# Patient Record
Sex: Female | Born: 1940 | Race: White | Hispanic: No | Smoking: Current every day smoker
Health system: Southern US, Community
[De-identification: ages and names within clinical notes are randomized; demographics above are authoritative.]

## PROBLEM LIST (undated history)

## (undated) DIAGNOSIS — M899 Disorder of bone, unspecified: Secondary | ICD-10-CM

## (undated) DIAGNOSIS — I739 Peripheral vascular disease, unspecified: Secondary | ICD-10-CM

## (undated) DIAGNOSIS — M949 Disorder of cartilage, unspecified: Secondary | ICD-10-CM

## (undated) DIAGNOSIS — F410 Panic disorder [episodic paroxysmal anxiety] without agoraphobia: Secondary | ICD-10-CM

## (undated) DIAGNOSIS — Z789 Other specified health status: Secondary | ICD-10-CM

## (undated) DIAGNOSIS — M129 Arthropathy, unspecified: Secondary | ICD-10-CM

## (undated) DIAGNOSIS — I10 Essential (primary) hypertension: Secondary | ICD-10-CM

## (undated) DIAGNOSIS — I358 Other nonrheumatic aortic valve disorders: Secondary | ICD-10-CM

## (undated) DIAGNOSIS — R7309 Other abnormal glucose: Secondary | ICD-10-CM

## (undated) DIAGNOSIS — J310 Chronic rhinitis: Secondary | ICD-10-CM

## (undated) DIAGNOSIS — K644 Residual hemorrhoidal skin tags: Secondary | ICD-10-CM

## (undated) DIAGNOSIS — E559 Vitamin D deficiency, unspecified: Secondary | ICD-10-CM

## (undated) DIAGNOSIS — E669 Obesity, unspecified: Secondary | ICD-10-CM

## (undated) DIAGNOSIS — R011 Cardiac murmur, unspecified: Secondary | ICD-10-CM

## (undated) DIAGNOSIS — F17200 Nicotine dependence, unspecified, uncomplicated: Secondary | ICD-10-CM

## (undated) DIAGNOSIS — I4819 Other persistent atrial fibrillation: Secondary | ICD-10-CM

## (undated) DIAGNOSIS — K5730 Diverticulosis of large intestine without perforation or abscess without bleeding: Secondary | ICD-10-CM

## (undated) DIAGNOSIS — E7800 Pure hypercholesterolemia, unspecified: Secondary | ICD-10-CM

## (undated) DIAGNOSIS — H269 Unspecified cataract: Secondary | ICD-10-CM

## (undated) DIAGNOSIS — T7840XA Allergy, unspecified, initial encounter: Secondary | ICD-10-CM

## (undated) HISTORY — DX: Other nonrheumatic aortic valve disorders: I35.8

## (undated) HISTORY — DX: Arthropathy, unspecified: M12.9

## (undated) HISTORY — DX: Disorder of bone, unspecified: M89.9

## (undated) HISTORY — DX: Other abnormal glucose: R73.09

## (undated) HISTORY — DX: Residual hemorrhoidal skin tags: K64.4

## (undated) HISTORY — DX: Peripheral vascular disease, unspecified: I73.9

## (undated) HISTORY — DX: Allergy, unspecified, initial encounter: T78.40XA

## (undated) HISTORY — DX: Pure hypercholesterolemia, unspecified: E78.00

## (undated) HISTORY — DX: Panic disorder (episodic paroxysmal anxiety): F41.0

## (undated) HISTORY — DX: Obesity, unspecified: E66.9

## (undated) HISTORY — DX: Other specified health status: Z78.9

## (undated) HISTORY — DX: Essential (primary) hypertension: I10

## (undated) HISTORY — DX: Diverticulosis of large intestine without perforation or abscess without bleeding: K57.30

## (undated) HISTORY — DX: Other persistent atrial fibrillation: I48.19

## (undated) HISTORY — DX: Cardiac murmur, unspecified: R01.1

## (undated) HISTORY — DX: Chronic rhinitis: J31.0

## (undated) HISTORY — DX: Vitamin D deficiency, unspecified: E55.9

## (undated) HISTORY — DX: Disorder of bone, unspecified: M94.9

## (undated) HISTORY — DX: Unspecified cataract: H26.9

## (undated) HISTORY — DX: Nicotine dependence, unspecified, uncomplicated: F17.200

## (undated) HISTORY — PX: OTHER SURGICAL HISTORY: SHX169

---
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
## 1980-12-06 HISTORY — PX: VAGINAL HYSTERECTOMY: SUR661

## 1983-12-07 HISTORY — PX: APPENDECTOMY: SHX54

## 1983-12-07 HISTORY — PX: CHOLECYSTECTOMY: SHX55

## 2006-12-06 LAB — HM DEXA SCAN

## 2009-12-06 HISTORY — PX: EYE SURGERY: SHX253

## 2010-08-26 ENCOUNTER — Ambulatory Visit: Payer: Self-pay | Admitting: Ophthalmology

## 2010-09-04 ENCOUNTER — Ambulatory Visit: Payer: Self-pay | Admitting: Cardiovascular Disease

## 2010-09-07 ENCOUNTER — Ambulatory Visit: Payer: Self-pay | Admitting: Ophthalmology

## 2010-10-12 ENCOUNTER — Ambulatory Visit: Payer: Self-pay | Admitting: Ophthalmology

## 2010-10-19 ENCOUNTER — Ambulatory Visit: Payer: Self-pay | Admitting: Ophthalmology

## 2011-04-07 ENCOUNTER — Ambulatory Visit: Payer: Self-pay | Admitting: Gastroenterology

## 2012-09-22 ENCOUNTER — Encounter: Payer: Self-pay | Admitting: *Deleted

## 2012-09-25 ENCOUNTER — Encounter: Payer: Self-pay | Admitting: Family Medicine

## 2012-10-02 ENCOUNTER — Ambulatory Visit (INDEPENDENT_AMBULATORY_CARE_PROVIDER_SITE_OTHER): Payer: 59 | Admitting: Family Medicine

## 2012-10-02 ENCOUNTER — Other Ambulatory Visit: Payer: Self-pay | Admitting: Family Medicine

## 2012-10-02 ENCOUNTER — Encounter: Payer: Self-pay | Admitting: Family Medicine

## 2012-10-02 VITALS — BP 184/100 | HR 83 | Temp 98.0°F | Resp 16 | Ht 63.0 in | Wt 251.2 lb

## 2012-10-02 DIAGNOSIS — F419 Anxiety disorder, unspecified: Secondary | ICD-10-CM | POA: Insufficient documentation

## 2012-10-02 DIAGNOSIS — E7800 Pure hypercholesterolemia, unspecified: Secondary | ICD-10-CM

## 2012-10-02 DIAGNOSIS — I10 Essential (primary) hypertension: Secondary | ICD-10-CM

## 2012-10-02 DIAGNOSIS — F329 Major depressive disorder, single episode, unspecified: Secondary | ICD-10-CM

## 2012-10-02 DIAGNOSIS — R7309 Other abnormal glucose: Secondary | ICD-10-CM

## 2012-10-02 DIAGNOSIS — R739 Hyperglycemia, unspecified: Secondary | ICD-10-CM | POA: Insufficient documentation

## 2012-10-02 LAB — POCT URINALYSIS DIPSTICK
Bilirubin, UA: NEGATIVE
Blood, UA: NEGATIVE
Nitrite, UA: NEGATIVE
Spec Grav, UA: 1.015
Urobilinogen, UA: 0.2
pH, UA: 7

## 2012-10-02 MED ORDER — SIMVASTATIN 40 MG PO TABS: 40.0000 mg | ORAL_TABLET | Freq: Every day | ORAL | Status: DC

## 2012-10-02 MED ORDER — METOPROLOL TARTRATE 25 MG PO TABS: 25.0000 mg | ORAL_TABLET | Freq: Two times a day (BID) | ORAL | Status: DC

## 2012-10-02 NOTE — Assessment & Plan Note (Signed)
Acute worsening.  Continue Maxzide and Enalapril at current doses.  Add Metoprolol 25mg  bid.  Check blood pressure daily; close follow-up in two weeks.  U/a negative.  EKG abnormal but unchanged.  Obtain labs.  To ED for severe headache, dizziness, chest pain, DOE, diaphoresis; pt expressed understanding.  To ED for BP> 220/120.

## 2012-10-02 NOTE — Assessment & Plan Note (Signed)
Controlled despite stressors; no change in medications at this time.

## 2012-10-02 NOTE — Progress Notes (Signed)
[address]   [place], [place]  [address]   [phone]  Subjective:    Patient ID: Danielle Yates, female    DOB: 1941-09-15, 71 y.o.   MRN: [id number]  HPIThis 71 y.o. female presents to establish care and for six month follow-up.  Last visit in [place] on 03/21/12 for CPE.    1. HTN:  Six month follow-up; no changes to management made at last visit.  Not checking blood pressure at home but at recent work screening in 08/2012 BP 130/86.  Compliance with medication; does not check blood pressure at home.  Denies severe HA, dizziness, blurred vision, paresthesias, focal weakness, chest pain, palpitations, shortness of breath, or leg swelling.  Not taking OTC medication.  2.  Hyperlipidemia:  Six month follow-up; management made at last visit includes stopping Lipitor and starting Crestor due to memory impairment associated with Lipitor.  Stopped Lipitor due to memory loss; tried Crestor but felt loopy; took one week and stopped; no statins since 08/2012.  Prefers to take Simvastatin.  Looked at Lipitor; Lipitor had expired.    3.  Glucose Intolerance: six month follow-up; no changes in management made at last visit. Last HgbA1c of 5.8.  4.  Depression with anxiety:  Six month follow-up; no changes to management made at last visit; reports good compliance with medication, good tolerance to medication, good symptom control.  Six deaths in church community in past month.  Coping well with deaths.  Memory not much better off of statins; worried that memory loss due to Prozac.  Denies SI/HI.  5.  Memory Loss:  Short term memory loss.  Not forgetting appointments; unable to remember names.  Word finding issues but will return.     Review of Systems  Constitutional: Negative for fever, chills, diaphoresis, fatigue and unexpected weight change.  HENT: Positive for congestion, rhinorrhea and postnasal drip. Negative for ear pain.   Respiratory: Positive for cough. Negative for shortness of breath  and stridor.   Cardiovascular: Negative for chest pain, palpitations and leg swelling.  Neurological: Positive for headaches. Negative for dizziness, tremors, seizures, syncope, facial asymmetry, speech difficulty, weakness, light-headedness and numbness.  Hematological: Negative for adenopathy.  Psychiatric/Behavioral: Negative for suicidal ideas, disturbed wake/sleep cycle, self-injury and dysphoric mood. The patient is not nervous/anxious.     Past Medical History  Diagnosis Date  . Arthropathy, unspecified, site unspecified   . Essential hypertension, benign   . External hemorrhoids without mention of complication   . Tobacco use disorder   . Diverticulosis of colon (without mention of hemorrhage)   . Panic disorder without agoraphobia   . Disorder of bone and cartilage, unspecified   . Chronic rhinitis   . Unspecified vitamin D deficiency   . Other abnormal glucose   . Obesity, unspecified   . Pure hypercholesterolemia   . Allergy   . Cataract     Past Surgical History  Procedure Date  . Tonsillectomy and adenoidectomy 1946  . Vaginal hysterectomy 1982    fibroid and endometriosis; one ovary remaining  . Cholecystectomy 1985    with open wound  . Appendectomy 1985  . Removed spot on face     [name]  . Eye surgery 2011    CATARACTS -  BOTH    Prior to Admission medications   Medication Sig Start Date End Date Taking? Authorizing Provider  aspirin EC 81 MG tablet Take 81 mg by mouth daily.   Yes Historical Provider, MD  beta carotene w/minerals (  OCUVITE) tablet Take 1 tablet by mouth daily.   Yes Historical Provider, MD  Calcium Carbonate-Vitamin D (CALCIUM 600+D) 600-200 MG-UNIT TABS Take 1 tablet by mouth daily.   Yes Historical Provider, MD  cholecalciferol (VITAMIN D) 1000 UNITS tablet Take 1,000 Units by mouth daily.   Yes Historical Provider, MD  enalapril (VASOTEC) 20 MG tablet Take 20 mg by mouth 2 (two) times daily.   Yes Historical Provider, MD  fish  oil-omega-3 fatty acids 1000 MG capsule Take 2 g by mouth daily.   Yes Historical Provider, MD  FLUoxetine (PROZAC) 20 MG capsule Take 20 mg by mouth daily.   Yes Historical Provider, MD  triamterene-hydrochlorothiazide (MAXZIDE-25) 37.5-25 MG per tablet Take 1 tablet by mouth daily.   Yes Historical Provider, MD  atorvastatin (LIPITOR) 10 MG tablet Take 10 mg by mouth daily.    Historical Provider, MD  metoprolol tartrate (LOPRESSOR) 25 MG tablet Take 1 tablet (25 mg total) by mouth 2 (two) times daily. 10/02/12   Ethelda Chick, MD  simvastatin (ZOCOR) 40 MG tablet Take 1 tablet (40 mg total) by mouth at bedtime. 10/02/12   Ethelda Chick, MD    Allergies  Allergen Reactions  . Codeine Nausea Only  . Crestor (Rosuvastatin) Other (See Comments)    PER PT MADE ME FEEL" WEIRD"  . Nickel Rash    History   Social History  . Marital Status: Widowed    Spouse Name: N/A    Number of Children: 0  . Years of Education: college   Occupational History  . 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 Smoking status: Current Every Day Smoker -- 1.0 packs/day for 50 years    Types: Cigarettes  . Smokeless tobacco: Not on file  . Alcohol Use: No  . Drug Use: Not on file  . Sexually Active: Not on file   Other Topics Concern  . Not on file   Social History Narrative   Lives alone. Widowed after 23 years. Not dating. Has dog. Exercise: Walking; light has not been walking as she used to before her 2 dogs passed away. Tries to walk some. Caffeine: Carbonated beverages, 3 servings/day;Diet coke. 3 a day.10/02/12  PER PATIENT'S LAVENDER FORM: WALK 5 DAYS/WEEK FOR 30 MINUTES    Family History  Problem Relation Age of Onset  . Hypertension Sister   . Heart disease Brother   . Hypertension Brother   . Hyperlipidemia Brother   . Aneurysm Mother   . Rheumatic fever Mother     as an adult       Objective:   Physical Exam  Nursing note and vitals  reviewed. Constitutional: She is oriented to person, place, and time. She appears well-developed and well-nourished. No distress.  HENT:  Head: Normocephalic and atraumatic.  Right Ear: External ear normal.  Left Ear: External ear normal.  Nose: Nose normal.  Mouth/Throat: Oropharynx is clear and moist.  Eyes: Conjunctivae normal and EOM are normal. Pupils are equal, round, and reactive to light.  Neck: Normal range of motion. Neck supple. No JVD present. No thyromegaly present.  Cardiovascular: Normal rate, regular rhythm, normal heart sounds and intact distal pulses.  Exam reveals no gallop and no friction rub.   No murmur heard. Pulmonary/Chest: Effort normal and breath sounds normal. No respiratory distress. She has no wheezes. She has no rales.  Lymphadenopathy:    She has no cervical adenopathy.  Neurological: She  is alert and oriented to person, place, and time. No cranial nerve deficit. She exhibits normal muscle tone. Coordination normal.  Skin: She is not diaphoretic.  Psychiatric: She has a normal mood and affect. Her behavior is normal. Judgment and thought content normal.    Results for orders placed in visit on 10/02/12  POCT URINALYSIS DIPSTICK      Component Value Range   Color, UA yellow     Clarity, UA clear     Glucose, UA neg     Bilirubin, UA neg     Ketones, UA neg     Spec Grav, UA 1.015     Blood, UA neg     pH, UA 7.0     Protein, UA neg     Urobilinogen, UA 0.2     Nitrite, UA neg     Leukocytes, UA Negative       EKG:  NSR; diffuse ST changes unchanged from 03/2012; T wave changes inferior-lateral regions.      Assessment & Plan:   1. Pure hypercholesterolemia  Lipid panel [[facility]]  2. Essential hypertension, benign  CK [[facility]], Comprehensive metabolic panel [[facility]], metoprolol tartrate (LOPRESSOR) 25 MG tablet, POCT urinalysis dipstick, EKG 12-Lead  3. Other abnormal glucose  CBC [[facility]], Hemoglobin A1c [[facility]]  4. Depression

## 2012-10-02 NOTE — Assessment & Plan Note (Signed)
Stable; obtain labs; continue with attempts at weight loss, exercise, dietary modifications.

## 2012-10-02 NOTE — Assessment & Plan Note (Signed)
Uncontrolled due to non-compliance with statins. Desires to restart Simvastatin therapy due to minimal side effects and agreeable; obtain labs.

## 2012-10-04 LAB — CBC WITH DIFFERENTIAL
Eos: 1 % (ref 0–5)
HCT: 42.7 % (ref 34.0–46.6)
Immature Granulocytes: 0 % (ref 0–2)
Lymphocytes Absolute: 1.7 10*3/uL (ref 0.7–3.1)
MCH: 28.4 pg (ref 26.6–33.0)
MCV: 87 fL (ref 79–97)
Monocytes Absolute: 0.6 10*3/uL (ref 0.1–0.9)
Neutrophils Absolute: 4.6 10*3/uL (ref 1.4–7.0)
Platelets: 255 10*3/uL (ref 155–379)
RBC: 4.93 x10E6/uL (ref 3.77–5.28)

## 2012-10-04 LAB — COMPREHENSIVE METABOLIC PANEL
AST: 18 IU/L (ref 0–40)
Albumin/Globulin Ratio: 1.7 (ref 1.1–2.5)
Albumin: 4.3 g/dL (ref 3.5–4.8)
Alkaline Phosphatase: 60 IU/L (ref 45–108)
BUN/Creatinine Ratio: 21 (ref 11–26)
CO2: 25 mmol/L (ref 19–28)
Creatinine, Ser: 0.71 mg/dL (ref 0.57–1.00)
GFR calc Af Amer: 99 mL/min/{1.73_m2} (ref >59)
GFR calc non Af Amer: 86 mL/min/{1.73_m2} (ref >59)
Globulin, Total: 2.6 g/dL (ref 1.5–4.5)
Sodium: 141 mmol/L (ref 134–144)
Total Bilirubin: 0.4 mg/dL (ref 0.0–1.2)

## 2012-10-04 LAB — SPECIMEN STATUS REPORT

## 2012-10-04 LAB — CK: Total CK: 74 U/L (ref 24–173)

## 2012-10-04 LAB — LIPID PANEL W/O CHOL/HDL RATIO: LDL Calculated: 139 mg/dL — ABNORMAL HIGH (ref 0–99)

## 2012-10-04 LAB — HGB A1C W/O EAG: Hgb A1c MFr Bld: 6 % — ABNORMAL HIGH (ref 4.8–5.6)

## 2012-10-04 NOTE — Progress Notes (Signed)
Reviewed and agree.

## 2012-10-16 ENCOUNTER — Ambulatory Visit (INDEPENDENT_AMBULATORY_CARE_PROVIDER_SITE_OTHER): Payer: 59 | Admitting: Family Medicine

## 2012-10-16 ENCOUNTER — Encounter: Payer: Self-pay | Admitting: Family Medicine

## 2012-10-16 VITALS — BP 163/71 | HR 57 | Temp 98.0°F | Resp 16 | Ht 63.0 in | Wt 250.0 lb

## 2012-10-16 DIAGNOSIS — Z8249 Family history of ischemic heart disease and other diseases of the circulatory system: Secondary | ICD-10-CM

## 2012-10-16 DIAGNOSIS — I10 Essential (primary) hypertension: Secondary | ICD-10-CM

## 2012-10-16 DIAGNOSIS — R9431 Abnormal electrocardiogram [ECG] [EKG]: Secondary | ICD-10-CM | POA: Insufficient documentation

## 2012-10-16 DIAGNOSIS — Z720 Tobacco use: Secondary | ICD-10-CM | POA: Insufficient documentation

## 2012-10-16 NOTE — Assessment & Plan Note (Addendum)
Improved but remains elevated.  Sudden increase in blood pressure from baseline; concern regarding secondary causes such as renal artery stenosis.  Also abnormal EKG and family history of CAD; thus, refer to cardiology to rule out secondary causes of elevated blood pressure and for further risk stratification.  No change in medications today; continue Metoprolol 25mg  1/2 bid but will likely warrant switch to other agent due to bradycardia (pulse in 55-60).  Asymptomatic today.  Recent urine, CMET, CBC normal.  Pt considering returning to [facility] for continued primary care and support her decision; feel that PCP needs to be in close proximity to patient's home and/or employment; to consider options and I will be supportive of her choice.

## 2012-10-16 NOTE — Progress Notes (Signed)
[address]   [place], [place]  [address]   [phone]  Subjective:    Patient ID: Danielle Yates, female    DOB: 07-16-1941, 71 y.o.   MRN: [id number]  HPIThis 71 y.o. female presents for two week follow-up for HTN.  At last visit, blood pressure extremely elevated. S/p u/a negative; s/p CMET, CBC negative. S/p EKG abnormal but unchanged from last EKG. Started on Metoprolol 25mg  one po bid.  Pt called on 10/06/12 feeling sluggish with pulse in 55-60; thus, Metoprolol dose decreased to 1/2 tablet bid.  Home blood pressure readings ranging 140-160/70-85 with pulse ranging 54-64.  Denies chest pain,palpitations, shortness of breath, leg swelling.  Denies HA severe, dizziness, focal weakness, paresthesias.  Has suffered a sinus headache twice weekly on average.  Very worried about drastic change in blood pressure.  Worried that may need to undergo evaluation by cardiology.  Has never had these issues with elevated blood pressure in the past; chronic HTN but no extremely elevated readings.  Also concerned regarding commute to [facility] for continued medical care.    Family history of early CAD; thus, pt very worried.  Review of Systems  Constitutional: Negative for fever, chills, diaphoresis and fatigue.  Respiratory: Negative for chest tightness, shortness of breath and stridor.   Cardiovascular: Negative for chest pain and palpitations.  Neurological: Positive for headaches. Negative for dizziness, tremors, syncope, facial asymmetry, speech difficulty, weakness, light-headedness and numbness.        Past Medical History  Diagnosis Date  . Arthropathy, unspecified, site unspecified   . Essential hypertension, benign   . External hemorrhoids without mention of complication   . Tobacco use disorder   . Diverticulosis of colon (without mention of hemorrhage)   . Panic disorder without agoraphobia   . Disorder of bone and cartilage, unspecified   . Chronic rhinitis   . Unspecified vitamin  D deficiency   . Other abnormal glucose   . Obesity, unspecified   . Pure hypercholesterolemia   . Allergy   . Cataract     Past Surgical History  Procedure Date  . Tonsillectomy and adenoidectomy 1946  . Vaginal hysterectomy 1982    fibroid and endometriosis; one ovary remaining  . Cholecystectomy 1985    with open wound  . Appendectomy 1985  . Removed spot on face     [name]  . Eye surgery 2011    CATARACTS -  BOTH    Prior to Admission medications   Medication Sig Start Date End Date Taking? Authorizing Provider  aspirin EC 81 MG tablet Take 81 mg by mouth daily.   Yes Historical Provider, MD  beta carotene w/minerals (OCUVITE) tablet Take 1 tablet by mouth daily.   Yes Historical Provider, MD  Calcium Carbonate-Vitamin D (CALCIUM 600+D) 600-200 MG-UNIT TABS Take 1 tablet by mouth daily.   Yes Historical Provider, MD  enalapril (VASOTEC) 20 MG tablet Take 20 mg by mouth 2 (two) times daily.   Yes Historical Provider, MD  fish oil-omega-3 fatty acids 1000 MG capsule Take 2 g by mouth daily.   Yes Historical Provider, MD  FLUoxetine (PROZAC) 20 MG capsule Take 20 mg by mouth daily.   Yes Historical Provider, MD  metoprolol tartrate (LOPRESSOR) 25 MG tablet Take 1 tablet (25 mg total) by mouth 2 (two) times daily. 10/02/12  Yes Ethelda Chick, MD  simvastatin (ZOCOR) 40 MG tablet Take 1 tablet (40 mg total) by mouth at bedtime. 10/02/12  Yes Ethelda Chick,  MD  triamterene-hydrochlorothiazide (MAXZIDE-25) 37.5-25 MG per tablet Take 1 tablet by mouth daily.   Yes Historical Provider, MD  atorvastatin (LIPITOR) 10 MG tablet Take 10 mg by mouth daily.    Historical Provider, MD  cholecalciferol (VITAMIN D) 1000 UNITS tablet Take 1,000 Units by mouth daily.    Historical Provider, MD    Allergies  Allergen Reactions  . Codeine Nausea Only  . Crestor (Rosuvastatin) Other (See Comments)    PER PT MADE ME FEEL" WEIRD"  . Nickel Rash    History   Social History  . Marital  Status: Widowed    Spouse Name: N/A    Number of Children: 0  . Years of Education: college   Occupational History  . 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 Smoking status: Current Every Day Smoker -- 1.0 packs/day for 50 years    Types: Cigarettes  . Smokeless tobacco: Not on file  . Alcohol Use: No  . Drug Use: Not on file  . Sexually Active: Not on file   Other Topics Concern  . Not on file   Social History Narrative   Lives alone. Widowed after 23 years. Not dating. Has dog. Exercise: Walking; light has not been walking as she used to before her 2 dogs passed away. Tries to walk some. Caffeine: Carbonated beverages, 3 servings/day;Diet coke. 3 a day.10/02/12  PER PATIENT'S LAVENDER FORM: WALK 5 DAYS/WEEK FOR 30 MINUTES    Family History  Problem Relation Age of Onset  . Hypertension Sister   . Heart disease Brother   . Hypertension Brother   . Hyperlipidemia Brother   . Aneurysm Mother   . Rheumatic fever Mother     as an adult    Objective:   Physical Exam  Nursing note and vitals reviewed. Constitutional: She is oriented to person, place, and time. She appears well-developed and well-nourished. No distress.  HENT:  Head: Normocephalic and atraumatic.  Eyes: Conjunctivae normal and EOM are normal. Pupils are equal, round, and reactive to light.  Neck: Normal range of motion. Neck supple. No thyromegaly present.  Cardiovascular: Normal rate, regular rhythm, normal heart sounds and intact distal pulses.  Exam reveals no gallop and no friction rub.   No murmur heard. Pulmonary/Chest: Effort normal. No respiratory distress. She has wheezes. She has no rales.       Scant wheezing RUL.  Musculoskeletal: She exhibits no edema.  Lymphadenopathy:    She has no cervical adenopathy.  Neurological: She is alert and oriented to person, place, and time. No cranial nerve deficit. She exhibits normal muscle tone. Coordination normal.  Skin:  She is not diaphoretic.  Psychiatric: She has a normal mood and affect. Her behavior is normal. Judgment and thought content normal.      Assessment & Plan:   1. Essential hypertension, benign  Ambulatory referral to Cardiology  2. Abnormal EKG  Ambulatory referral to Cardiology  3.  Family History of early CAD 4. Tobacco Abuse

## 2012-10-16 NOTE — Assessment & Plan Note (Signed)
Persistent; with family history of early CAD and with recent blood pressure elevation, refer to cardiology for further evaluation.

## 2012-10-16 NOTE — Assessment & Plan Note (Signed)
Pre-contemplative at this time. 

## 2012-10-16 NOTE — Patient Instructions (Addendum)
1. Essential hypertension, benign  Ambulatory referral to Cardiology  2. Abnormal EKG  Ambulatory referral to Cardiology

## 2012-10-16 NOTE — Assessment & Plan Note (Signed)
Persistent.  Cardiac risk factors include family history of CAD, tobacco abuse, obesity, HTN, hyperlipidemia, glucose intolerance (i.e. Metabolic syndrome).  Refer to cardiology for further evaluation.

## 2012-10-18 NOTE — Progress Notes (Signed)
Reviewed and agree.

## 2012-10-20 ENCOUNTER — Encounter: Payer: Self-pay | Admitting: Cardiovascular Disease

## 2012-10-20 ENCOUNTER — Ambulatory Visit (INDEPENDENT_AMBULATORY_CARE_PROVIDER_SITE_OTHER): Payer: 59 | Admitting: Cardiovascular Disease

## 2012-10-20 VITALS — BP 152/90 | HR 60 | Ht 63.0 in | Wt 246.5 lb

## 2012-10-20 DIAGNOSIS — I10 Essential (primary) hypertension: Secondary | ICD-10-CM

## 2012-10-20 DIAGNOSIS — R0600 Dyspnea, unspecified: Secondary | ICD-10-CM | POA: Insufficient documentation

## 2012-10-20 DIAGNOSIS — E7800 Pure hypercholesterolemia, unspecified: Secondary | ICD-10-CM

## 2012-10-20 MED ORDER — SIMVASTATIN 40 MG PO TABS: 20.0000 mg | ORAL_TABLET | Freq: Every day | ORAL | Status: DC

## 2012-10-20 MED ORDER — AMLODIPINE BESYLATE 5 MG PO TABS: 5.0000 mg | ORAL_TABLET | Freq: Every day | ORAL | Status: DC

## 2012-10-20 NOTE — Assessment & Plan Note (Signed)
Patient has mild dyspnea without chest pain. I will obtain an echocardiogram for evaluation. ECG does not show significant ischemic changes. If the patient continues to have symptoms after controlling her blood pressure, I would consider a stress test for evaluation especially with her family history of CAD.

## 2012-10-20 NOTE — Assessment & Plan Note (Signed)
I will decrease the dose of simvastatin 20 mg daily due to interaction with amlodipine.

## 2012-10-20 NOTE — Patient Instructions (Addendum)
Stop Metoprolol  Start Amlodipine 5 mg once daily.  Decrease Simvastatin to 20 mg daily (1/2 tablet of 40).   Your physician has requested that you have a renal artery duplex. During this test, an ultrasound is used to evaluate blood flow to the kidneys. Allow one hour for this exam. Do not eat after midnight the day before and avoid carbonated beverages. Take your medications as you usually do.  Your physician has requested that you have an echocardiogram. Echocardiography is a painless test that uses sound waves to create images of your heart. It provides your doctor with information about the size and shape of your heart and how well your heart's chambers and valves are working. This procedure takes approximately one hour. There are no restrictions for this procedure.  Follow up after tests.

## 2012-10-20 NOTE — Progress Notes (Signed)
HPI  This is a pleasant 71 year old Caucasian female who was referred by Dr. Nilda Simmer for evaluation of hypertension. The patient has no previous cardiac history. She had hypertension for many years which has been reasonably controlled up until recently. Her blood pressure has been gradually going up in spite of being on an ACE inhibitor and a thiazide diuretic. She was recently started on metoprolol 25 mg twice daily. However, she became bradycardic with fatigue. Thus, the dose was decreased to 12.5 mg twice daily. She had recent routine labs which were unremarkable. She denies any chest pain. She does complain of increased dyspnea and fatigue. No orthopnea or PND. She has family history of coronary artery disease. Her father died at the age of 83 from myocardial infarction. Her brother had myocardial infarction at the age of 63 and subsequently underwent CABG.    Allergies  Allergen Reactions  . Codeine Nausea Only  . Crestor (Rosuvastatin) Other (See Comments)    PER PT MADE ME FEEL" WEIRD"  . Nickel Rash     Current Outpatient Prescriptions on File Prior to Visit  Medication Sig Dispense Refill  . aspirin EC 81 MG tablet Take 81 mg by mouth daily.      . beta carotene w/minerals (OCUVITE) tablet Take 1 tablet by mouth daily.      . Calcium Carbonate-Vitamin D (CALCIUM 600+D) 600-200 MG-UNIT TABS Take 1 tablet by mouth daily.      . enalapril (VASOTEC) 20 MG tablet Take 20 mg by mouth 2 (two) times daily.      . fish oil-omega-3 fatty acids 1000 MG capsule Take 2 g by mouth daily.      [name] FLUoxetine (PROZAC) 20 MG capsule Take 20 mg by mouth daily.      [name] triamterene-hydrochlorothiazide (MAXZIDE-25) 37.5-25 MG per tablet Take 1 tablet by mouth daily.      . [DISCONTINUED] simvastatin (ZOCOR) 40 MG tablet Take 1 tablet (40 mg total) by mouth at bedtime.  90 tablet  3  . amLODipine (NORVASC) 5 MG tablet Take 1 tablet (5 mg total) by mouth daily.  30 tablet  3     Past Medical  History  Diagnosis Date  . Arthropathy, unspecified, site unspecified   . Essential hypertension, benign   . External hemorrhoids without mention of complication   . Tobacco use disorder   . Diverticulosis of colon (without mention of hemorrhage)   . Panic disorder without agoraphobia   . Disorder of bone and cartilage, unspecified   . Chronic rhinitis   . Unspecified vitamin D deficiency   . Other abnormal glucose   . Obesity, unspecified   . Pure hypercholesterolemia   . Allergy   . Cataract      Past Surgical History  Procedure Date  . Tonsillectomy and adenoidectomy 1946  . Vaginal hysterectomy 1982    fibroid and endometriosis; one ovary remaining  . Cholecystectomy 1985    with open wound  . Appendectomy 1985  . Removed spot on face     [name]  . Eye surgery 2011    CATARACTS -  BOTH     Family History  Problem Relation Age of Onset  . Hypertension Sister   . Heart disease Brother   . Hypertension Brother   . Hyperlipidemia Brother   . Aneurysm Mother   . Rheumatic fever Mother     as an adult     History   Social History  . Marital Status: Widowed  Spouse Name: N/A    Number of Children: 0  . Years of Education: college   Occupational History  . 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 Smoking status: Current Every Day Smoker -- 0.5 packs/day for 50 years    Types: Cigarettes  . Smokeless tobacco: Not on file  . Alcohol Use: No  . Drug Use: No  . Sexually Active: Not on file   Other Topics Concern  . Not on file   Social History Narrative   Lives alone. Widowed after 23 years. Not dating. Has dog. Exercise: Walking; light has not been walking as she used to before her 2 dogs passed away. Tries to walk some. Caffeine: Carbonated beverages, 3 servings/day;Diet coke. 3 a day.10/02/12  PER PATIENT'S LAVENDER FORM: WALK 5 DAYS/WEEK FOR 30 MINUTES     ROS Constitutional: Negative for fever, chills,  diaphoresis. HENT: Negative for hearing loss, nosebleeds, congestion, sore throat, facial swelling, drooling, trouble swallowing, neck pain, voice change, sinus pressure and tinnitus.  Eyes: Negative for photophobia, pain, discharge and visual disturbance.  Respiratory: Negative for apnea, cough, chest tightness and wheezing.  Cardiovascular: Negative for chest pain, palpitations and leg swelling.  Gastrointestinal: Negative for nausea, vomiting, abdominal pain, diarrhea, constipation, blood in stool and abdominal distention.  Genitourinary: Negative for dysuria, urgency, frequency, hematuria and decreased urine volume.  Musculoskeletal: Negative for myalgias, back pain, joint swelling, arthralgias and gait problem.  Skin: Negative for color change, pallor, rash and wound.  Neurological: Negative for dizziness, tremors, seizures, syncope, speech difficulty, weakness, light-headedness, numbness and headaches.  Psychiatric/Behavioral: Negative for suicidal ideas, hallucinations, behavioral problems and agitation. The patient is not nervous/anxious.     PHYSICAL EXAM   BP 152/90  Pulse 60  Ht 5\' 3"  (1.6 m)  Wt 246 lb 8 oz (111.812 kg)  BMI 43.67 kg/m2 Constitutional: She is oriented to person, place, and time. She appears well-developed and well-nourished. No distress.  HENT: No nasal discharge.  Head: Normocephalic and atraumatic.  Eyes: Pupils are equal and round. Right eye exhibits no discharge. Left eye exhibits no discharge.  Neck: Normal range of motion. Neck supple. No JVD present. No thyromegaly present.  Cardiovascular: Normal rate, regular rhythm, normal heart sounds. Exam reveals no gallop and no friction rub. No murmur heard.  Pulmonary/Chest: Effort normal and breath sounds normal. No stridor. No respiratory distress. She has no wheezes. She has no rales. She exhibits no tenderness.  Abdominal: Soft. Bowel sounds are normal. She exhibits no distension. There is no tenderness.  There is no rebound and no guarding.  Musculoskeletal: Normal range of motion. She exhibits no edema and no tenderness.  Neurological: She is alert and oriented to person, place, and time. Coordination normal.  Skin: Skin is warm and dry. No rash noted. She is not diaphoretic. No erythema. No pallor.  Psychiatric: She has a normal mood and affect. Her behavior is normal. Judgment and thought content normal.     EKG: Sinus  Rhythm  WITHIN NORMAL LIMITS   ASSESSMENT AND PLAN

## 2012-10-20 NOTE — Assessment & Plan Note (Signed)
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 She is currently on 3 different blood pressure medications including a diuretic with uncontrolled readings. Metoprolol does not seem to be effective and is causing bradycardia at a higher dose. Thus, I recommend stopping this medication starting amlodipine 5 mg once daily. I agree that evaluation for secondary hypertension is appropriate. I will schedule the patient for renal artery duplex ultrasound. We should also consider screening for sleep apnea if not already done. I asked her to continue monitoring her blood pressure and bring the readings with her for a followup appointment.

## 2012-10-24 ENCOUNTER — Telehealth: Payer: Self-pay | Admitting: Cardiovascular Disease

## 2012-10-24 MED ORDER — CARVEDILOL 3.125 MG PO TABS: 3.1250 mg | ORAL_TABLET | Freq: Two times a day (BID) | ORAL | Status: DC

## 2012-10-24 NOTE — Telephone Encounter (Signed)
Pt calling says the amlodipine is causing her to have no energy. Feels dizzy or drowsy

## 2012-10-24 NOTE — Telephone Encounter (Signed)
Stop Amlodipine.  Start Coreg 3.215 mg bid. All BP meds will cause fatigue initially.

## 2012-10-24 NOTE — Telephone Encounter (Signed)
Pt was notified.  Rx was sent to [facility] per her request.

## 2012-10-24 NOTE — Telephone Encounter (Signed)
BP has been running 158/81,167/86, 158/87, 152/91, 152/86, 146/91, 147/82 Says she has only been taking amlodipine x 4 days HR this morning was 109 bpm C/o weakness/"no energy" I explained she could try cutting amlodipine in 1/2 to see if this helps symptoms but this is not going to help BP I told her I would let Dr. Kirke Corin know about her symptoms and BP readings and will call her back with response Understanding  verb

## 2012-10-30 ENCOUNTER — Other Ambulatory Visit (INDEPENDENT_AMBULATORY_CARE_PROVIDER_SITE_OTHER): Payer: 59

## 2012-10-30 ENCOUNTER — Other Ambulatory Visit: Payer: Self-pay

## 2012-10-30 DIAGNOSIS — R0602 Shortness of breath: Secondary | ICD-10-CM

## 2012-10-30 DIAGNOSIS — R0600 Dyspnea, unspecified: Secondary | ICD-10-CM

## 2012-10-31 ENCOUNTER — Telehealth: Payer: Self-pay | Admitting: Cardiovascular Disease

## 2012-10-31 NOTE — Telephone Encounter (Signed)
The patient is concerned with her blood pressure readings running too high: 11/20- 141/78 HR 63 11/21-142/88 HR 64 127/65 HR 60 138/85 HR 65 11/22- 7:45 am 167/81 HR 61 136/87 HR 62 11/23-151/85 HR 70 11/24 151/98 HR 68 11/25- 116/84 HR 62 11/26- 142/98 HR 63 during Echo yesterday 170/98.   Patient has not returned to work because concerned about the Echo. She would like to know if okay for her to return to work. She has an FMLA form that she will send to our office to have filled out. She states does want to return to work but just worried and has not returned because thought she should stay out of work until the Echo results back.

## 2012-10-31 NOTE — Telephone Encounter (Signed)
Error

## 2012-11-01 ENCOUNTER — Other Ambulatory Visit: Payer: Self-pay

## 2012-11-01 MED ORDER — ENALAPRIL MALEATE 20 MG PO TABS: ORAL_TABLET | ORAL | Status: DC

## 2012-11-01 NOTE — Telephone Encounter (Signed)
FYI

## 2012-11-01 NOTE — Telephone Encounter (Signed)
Pt concerned about high BP's (worse in am) This am at 0530=173/112, HR=59 BPM                   0615=144/86, HR=66 BPM (after taking meds) She is still out of work until meds get "adjusted" C/o feeling fatigued, thinks she has a sinus infection and asks if Dr. Kirke Corin would prescribe Augmentin. I explained he would not do this and should see her PCP. She will call their office for appt. Pt is afebrile I advised her to try increasing enalapril to 20 mg in am, 40 mg at hs. I explained a carvedilol increase may effect HR further and HR is already borderline, so she will try increasing enalapril dose first at hs to see if am BPs are better controlled  She also mentions FMLA forms were to have been faxed to [place]. I told her I would see if these have been sent and will call her back if not

## 2012-11-06 ENCOUNTER — Telehealth: Payer: Self-pay | Admitting: Cardiovascular Disease

## 2012-11-06 NOTE — Telephone Encounter (Signed)
Pt informed Understanding verb She says she has been holding coreg and gave me BP readings as follows: 0800 138/93, HR=64 0900 136/76, HR=64 1200 150/85, HR=62 1330 151/91, HR=80  I explained to pt I would inform Dr. Kirke Corin of above readings and will call her back with any changes She verb. Understanding and reminds me she has appt for renal u/s tomm

## 2012-11-06 NOTE — Telephone Encounter (Signed)
Stop Coreg and monitor blood pressure.

## 2012-11-06 NOTE — Telephone Encounter (Signed)
Pt informed Understanding verb 

## 2012-11-06 NOTE — Telephone Encounter (Signed)
Blood pressure readings are acceptable off Coreg.

## 2012-11-06 NOTE — Telephone Encounter (Signed)
fyi  See below

## 2012-11-06 NOTE — Telephone Encounter (Signed)
Patient called this morning stating the COREG she is not able to tolerate.  It is causing headache and dizziness which has caused her to miss work.  Patient would like to stop this medication.  Please advise.

## 2012-11-06 NOTE — Telephone Encounter (Signed)
Will forward to RN.

## 2012-11-07 ENCOUNTER — Encounter (INDEPENDENT_AMBULATORY_CARE_PROVIDER_SITE_OTHER): Payer: 59

## 2012-11-07 DIAGNOSIS — I10 Essential (primary) hypertension: Secondary | ICD-10-CM

## 2012-11-09 ENCOUNTER — Encounter: Payer: Self-pay | Admitting: Cardiovascular Disease

## 2012-11-09 ENCOUNTER — Ambulatory Visit (INDEPENDENT_AMBULATORY_CARE_PROVIDER_SITE_OTHER): Payer: 59 | Admitting: Cardiovascular Disease

## 2012-11-09 VITALS — BP 172/90 | HR 99 | Ht 63.0 in | Wt 242.5 lb

## 2012-11-09 DIAGNOSIS — R0600 Dyspnea, unspecified: Secondary | ICD-10-CM

## 2012-11-09 DIAGNOSIS — I10 Essential (primary) hypertension: Secondary | ICD-10-CM

## 2012-11-09 MED ORDER — DILTIAZEM HCL ER COATED BEADS 120 MG PO CP24: 120.0000 mg | ORAL_CAPSULE | Freq: Every day | ORAL | Status: DC

## 2012-11-09 NOTE — Assessment & Plan Note (Signed)
Echocardiogram showed normal LV systolic function, mild LVH and mild diastolic dysfunction. She reports improvement in her symptoms. No indication for a stress test at this time.

## 2012-11-09 NOTE — Assessment & Plan Note (Signed)
The patient has refractory hypertension with intolerance to multiple medications. She did not tolerate metoprolol, carvedilol or amlodipine.  Renal artery duplex ultrasound showed no evidence of renal artery stenosis. Home blood pressure readings appear to be acceptable but her blood pressure is elevated today in the office. She likely has white coat syndrome. She still needs more control of her blood pressure. Thus, I will try diltiazem extended release 120 mg once daily. We should also consider screening for sleep apnea if not already done. I asked her to continue monitoring her blood pressure and bring the readings with her for a followup appointment.

## 2012-11-09 NOTE — Progress Notes (Signed)
Primary care physician: Dr. Nilda Simmer  HPI  This is a pleasant 71 year old Caucasian female who is here today for a followup visit regarding refractory  hypertension. The patient has no previous cardiac history. She had hypertension for many years which has been reasonably controlled up until recently. Her blood pressure has been gradually going up in spite of being on an ACE inhibitor and a thiazide diuretic. She was recently started on metoprolol 25 mg twice daily by Dr. Katrinka Blazing but did not tolerate the medication due to bradycardic and fatigue. She has family history of coronary artery disease. Her father died at the age of 71 from myocardial infarction. Her brother had myocardial infarction at the age of 28 and subsequently underwent CABG. During her last visit, I stop metoprolol and added amlodipine. However, she called [place] 2 days later reporting intolerance due to headache and dizziness. I decided to treat her with small dose carvedilol but again she did not tolerate that medication due to fatigue. Thus, I increased enalapril and continued her diuretic. She underwent renal duplex ultrasound which showed no evidence of renal artery stenosis.    Allergies  Allergen Reactions  . Codeine Nausea Only  . Crestor (Rosuvastatin) Other (See Comments)    PER PT MADE ME FEEL" WEIRD"  . Nickel Rash     Current Outpatient Prescriptions on File Prior to Visit  Medication Sig Dispense Refill  . aspirin EC 81 MG tablet Take 81 mg by mouth daily.      . beta carotene w/minerals (OCUVITE) tablet Take 1 tablet by mouth daily.      . Calcium Carbonate-Vitamin D (CALCIUM 600+D) 600-200 MG-UNIT TABS Take 1 tablet by mouth daily.      . fish oil-omega-3 fatty acids 1000 MG capsule Take 2 g by mouth daily.      [name] FLUoxetine (PROZAC) 20 MG capsule Take 20 mg by mouth daily.      . simvastatin (ZOCOR) 40 MG tablet Take 0.5 tablets (20 mg total) by mouth at bedtime.  90 tablet  3  .  triamterene-hydrochlorothiazide (MAXZIDE-25) 37.5-25 MG per tablet Take 1 tablet by mouth daily.      . [DISCONTINUED] enalapril (VASOTEC) 20 MG tablet Take 1 tablet in am and 1/12 tablets in pm  75 tablet  3  . diltiazem (CARDIZEM CD) 120 MG 24 hr capsule Take 1 capsule (120 mg total) by mouth daily.  30 capsule  6     Past Medical History  Diagnosis Date  . Arthropathy, unspecified, site unspecified   . Essential hypertension, benign   . External hemorrhoids without mention of complication   . Tobacco use disorder   . Diverticulosis of colon (without mention of hemorrhage)   . Panic disorder without agoraphobia   . Disorder of bone and cartilage, unspecified   . Chronic rhinitis   . Unspecified vitamin D deficiency   . Other abnormal glucose   . Obesity, unspecified   . Pure hypercholesterolemia   . Allergy   . Cataract      Past Surgical History  Procedure Date  . Tonsillectomy and adenoidectomy 1946  . Vaginal hysterectomy 1982    fibroid and endometriosis; one ovary remaining  . Cholecystectomy 1985    with open wound  . Appendectomy 1985  . Removed spot on face     [name]  . Eye surgery 2011    CATARACTS -  BOTH     Family History  Problem Relation Age of Onset  .  Hypertension Sister   . Heart disease Brother   . Hypertension Brother   . Hyperlipidemia Brother   . Aneurysm Mother   . Rheumatic fever Mother     as an adult     History   Social History  . Marital Status: Widowed    Spouse Name: N/A    Number of Children: 0  . Years of Education: college   Occupational History  . 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 Smoking status: Current Every Day Smoker -- 0.5 packs/day for 50 years    Types: Cigarettes  . Smokeless tobacco: Not on file  . Alcohol Use: No  . Drug Use: No  . Sexually Active: Not on file   Other Topics Concern  . Not on file   Social History Narrative   Lives alone. Widowed after 23  years. Not dating. Has dog. Exercise: Walking; light has not been walking as she used to before her 2 dogs passed away. Tries to walk some. Caffeine: Carbonated beverages, 3 servings/day;Diet coke. 3 a day.10/02/12  PER PATIENT'S LAVENDER FORM: WALK 5 DAYS/WEEK FOR 30 MINUTES       PHYSICAL EXAM   BP 172/90  Pulse 99  Ht 5\' 3"  (1.6 m)  Wt 242 lb 8 oz (109.997 kg)  BMI 42.96 kg/m2 Constitutional: She is oriented to person, place, and time. She appears well-developed and well-nourished. No distress.  HENT: No nasal discharge.  Head: Normocephalic and atraumatic.  Eyes: Pupils are equal and round. Right eye exhibits no discharge. Left eye exhibits no discharge.  Neck: Normal range of motion. Neck supple. No JVD present. No thyromegaly present.  Cardiovascular: Normal rate, regular rhythm, normal heart sounds. Exam reveals no gallop and no friction rub. No murmur heard.  Pulmonary/Chest: Effort normal and breath sounds normal. No stridor. No respiratory distress. She has no wheezes. She has no rales. She exhibits no tenderness.  Abdominal: Soft. Bowel sounds are normal. She exhibits no distension. There is no tenderness. There is no rebound and no guarding.  Musculoskeletal: Normal range of motion. She exhibits no edema and no tenderness.  Neurological: She is alert and oriented to person, place, and time. Coordination normal.  Skin: Skin is warm and dry. No rash noted. She is not diaphoretic. No erythema. No pallor.  Psychiatric: She has a normal mood and affect. Her behavior is normal. Judgment and thought content normal.      ASSESSMENT AND PLAN

## 2012-11-09 NOTE — Patient Instructions (Addendum)
Start Diltiazem ER 120 mg once daily.  Continue other medications.  Follow up in 3 months.

## 2012-11-13 ENCOUNTER — Ambulatory Visit: Payer: 59 | Admitting: Cardiovascular Disease

## 2012-11-27 ENCOUNTER — Other Ambulatory Visit: Payer: Self-pay | Admitting: Cardiovascular Disease

## 2012-11-27 NOTE — Telephone Encounter (Signed)
Patient needs a 90day supply faxed to the mail order pharmacy [phone]

## 2012-11-28 ENCOUNTER — Other Ambulatory Visit: Payer: Self-pay

## 2012-11-28 MED ORDER — DILTIAZEM HCL ER COATED BEADS 120 MG PO CP24: 120.0000 mg | ORAL_CAPSULE | Freq: Every day | ORAL | Status: DC

## 2012-11-28 NOTE — Telephone Encounter (Signed)
Refill sent for dilitiazem to [facility]

## 2012-12-01 ENCOUNTER — Other Ambulatory Visit: Payer: Self-pay

## 2012-12-01 MED ORDER — LISINOPRIL 10 MG PO TABS: 10.0000 mg | ORAL_TABLET | Freq: Every day | ORAL | Status: DC

## 2012-12-08 ENCOUNTER — Telehealth: Payer: Self-pay

## 2012-12-08 NOTE — Telephone Encounter (Signed)
I called Misty Stanley back in [place] to inform her that there is another pt with a similar name and DOB taking lisinopril and to make sure this is not the pt pharmacy is calling about. She says she will verify with pharmacy.

## 2012-12-08 NOTE — Telephone Encounter (Signed)
I received t/c from lisa in [place]. She says lisinopril was refilled for pt in error by another employee and pharmacy is calling to verify. I advised, per most recent office notes and medication records, pt should not be on ,lisinopril and this should not have been filled. She says she will advise pharmacy to not fill.

## 2012-12-08 NOTE — Telephone Encounter (Signed)
Received a medical clarification request from [facility] for enalapril 20mg  and lisinopril 10mg . Spoke with Melissa,RN in [place] office to verify pt med list. Per the pt last visit with Dr.Arida enalapril was discontinued and lisinopril is not on the list. Called pharmacy to cancel Rx rqst  For both.

## 2012-12-27 ENCOUNTER — Telehealth: Payer: Self-pay

## 2012-12-27 NOTE — Telephone Encounter (Signed)
Pt said someone here cancelled her blood pressure medication would like for someone to call her about this

## 2012-12-28 ENCOUNTER — Other Ambulatory Visit: Payer: Self-pay

## 2012-12-28 MED ORDER — ENALAPRIL MALEATE 20 MG PO TABS: ORAL_TABLET | ORAL | Status: DC

## 2012-12-28 NOTE — Telephone Encounter (Signed)
We did not cancel this, looks like [place], will you contact patient?

## 2012-12-28 NOTE — Telephone Encounter (Signed)
Per pt and last office note, enalapril was never discontinued. According to most recent telephone notes as well, Dr,. Arida wanted pt on enalapril. This was never discontinued by MD. It was documented as discontinued on 11/09/12 visit but pt says she has never stopped this.  Again, according to Dr. Jari Sportsman last note, pt should still be taking enalapril. I will send refill in for pt. She reassures me she is not out, just wants to make sure she can refill when she needs to via mail order.

## 2012-12-28 NOTE — Telephone Encounter (Signed)
lmtcb

## 2013-01-02 ENCOUNTER — Other Ambulatory Visit: Payer: Self-pay

## 2013-01-02 MED ORDER — ENALAPRIL MALEATE 20 MG PO TABS: ORAL_TABLET | ORAL | Status: DC

## 2013-01-02 NOTE — Telephone Encounter (Signed)
Refill sent for enalapril

## 2013-01-20 ENCOUNTER — Other Ambulatory Visit: Payer: Self-pay

## 2013-02-12 ENCOUNTER — Ambulatory Visit (INDEPENDENT_AMBULATORY_CARE_PROVIDER_SITE_OTHER): Payer: 59 | Admitting: Cardiovascular Disease

## 2013-02-12 ENCOUNTER — Encounter: Payer: Self-pay | Admitting: Cardiovascular Disease

## 2013-02-12 VITALS — BP 180/72 | HR 68 | Ht 63.0 in | Wt 252.5 lb

## 2013-02-12 DIAGNOSIS — F17200 Nicotine dependence, unspecified, uncomplicated: Secondary | ICD-10-CM

## 2013-02-12 DIAGNOSIS — Z720 Tobacco use: Secondary | ICD-10-CM

## 2013-02-12 DIAGNOSIS — I10 Essential (primary) hypertension: Secondary | ICD-10-CM

## 2013-02-12 NOTE — Assessment & Plan Note (Signed)
I again discussed with her the importance of smoking cessation. 

## 2013-02-12 NOTE — Patient Instructions (Addendum)
Continue same medications  Follow up in 1 year

## 2013-02-12 NOTE — Assessment & Plan Note (Signed)
Her blood pressure is elevated today. However, home blood pressure readings have been mostly in the optimal range. As suspected, she likely has a component of white coat syndrome. Continue current medications.

## 2013-02-12 NOTE — Progress Notes (Signed)
HPI  This is a pleasant 72 year old Caucasian female who is here today for a followup visit regarding refractory  hypertension. The patient has no previous cardiac history.she was seen for high blood pressure in spite of being on an ACE inhibitor and a thiazide diuretic. She did not tolerate metoprolol due to bradycardia and fatigue. She has family history of coronary artery disease. Her father died at the age of 40 from myocardial infarction. Her brother had myocardial infarction at the age of 24 and subsequently underwent CABG. She did not tolerate amlodipine and carvedilol due to headache and dizziness.  She underwent renal duplex ultrasound which showed no evidence of renal artery stenosis. Echocardiogram showed normal LV systolic function. During last visit, I added diltiazem 120 mg once daily. I reviewed home blood pressure readings and they are in the optimal range more than 90% of the time less than 140/80. She feels well and denies chest pain, dyspnea or headache.    Allergies  Allergen Reactions  . Codeine Nausea Only  . Crestor (Rosuvastatin) Other (See Comments)    PER PT MADE ME FEEL" WEIRD"  . Nickel Rash     Current Outpatient Prescriptions on File Prior to Visit  Medication Sig Dispense Refill  . aspirin EC 81 MG tablet Take 81 mg by mouth daily.      . beta carotene w/minerals (OCUVITE) tablet Take 1 tablet by mouth daily.      . Calcium Carbonate-Vitamin D (CALCIUM 600+D) 600-200 MG-UNIT TABS Take 1 tablet by mouth daily.      [name] diltiazem (CARDIZEM CD) 120 MG 24 hr capsule Take 1 capsule (120 mg total) by mouth daily.  90 capsule  3  . enalapril (VASOTEC) 20 MG tablet Take 1 tablet in am and 2 tablets in pm  270 tablet  3  . fish oil-omega-3 fatty acids 1000 MG capsule Take 2 g by mouth daily.      [name] FLUoxetine (PROZAC) 20 MG capsule Take 20 mg by mouth daily.      . simvastatin (ZOCOR) 40 MG tablet Take 0.5 tablets (20 mg total) by mouth at bedtime.  90 tablet  3  .  triamterene-hydrochlorothiazide (MAXZIDE-25) 37.5-25 MG per tablet Take 1 tablet by mouth daily.       No current facility-administered medications on file prior to visit.     Past Medical History  Diagnosis Date  . Arthropathy, unspecified, site unspecified   . Essential hypertension, benign   . External hemorrhoids without mention of complication   . Tobacco use disorder   . Diverticulosis of colon (without mention of hemorrhage)   . Panic disorder without agoraphobia   . Disorder of bone and cartilage, unspecified   . Chronic rhinitis   . Unspecified vitamin D deficiency   . Other abnormal glucose   . Obesity, unspecified   . Pure hypercholesterolemia   . Allergy   . Cataract      Past Surgical History  Procedure Laterality Date  . Tonsillectomy and adenoidectomy  1946  . Vaginal hysterectomy  1982    fibroid and endometriosis; one ovary remaining  . Cholecystectomy  1985    with open wound  . Appendectomy  1985  . Removed spot on face      [name]  . Eye surgery  2011    CATARACTS -  BOTH     Family History  Problem Relation Age of Onset  . Hypertension Sister   . Heart disease Brother   .  Hypertension Brother   . Hyperlipidemia Brother   . Aneurysm Mother   . Rheumatic fever Mother     as an adult     History   Social History  . Marital Status: Widowed    Spouse Name: N/A    Number of Children: 0  . Years of Education: college   Occupational History  . 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 Smoking status: Current Every Day Smoker -- 0.50 packs/day for 50 years    Types: Cigarettes  . Smokeless tobacco: Not on file  . Alcohol Use: No  . Drug Use: No  . Sexually Active: Not on file   Other Topics Concern  . Not on file   Social History Narrative   Lives alone. Widowed after 23 years. Not dating. Has dog. Exercise: Walking; light has not been walking as she used to before her 2 dogs passed away. Tries  to walk some. Caffeine: Carbonated beverages, 3 servings/day;Diet coke. 3 a day.      10/02/12  PER PATIENT'S LAVENDER FORM: WALK 5 DAYS/WEEK FOR 30 MINUTES       PHYSICAL EXAM   BP 180/72  Pulse 68  Ht 5\' 3"  (1.6 m)  Wt 252 lb 8 oz (114.533 kg)  BMI 44.74 kg/m2 Constitutional: She is oriented to person, place, and time. She appears well-developed and well-nourished. No distress.  HENT: No nasal discharge.  Head: Normocephalic and atraumatic.  Eyes: Pupils are equal and round. Right eye exhibits no discharge. Left eye exhibits no discharge.  Neck: Normal range of motion. Neck supple. No JVD present. No thyromegaly present.  Cardiovascular: Normal rate, regular rhythm, normal heart sounds. Exam reveals no gallop and no friction rub. There is one out of 6 systolic ejection murmur at the aortic area Pulmonary/Chest: Effort normal and breath sounds normal. No stridor. No respiratory distress. She has no wheezes. She has no rales. She exhibits no tenderness.  Abdominal: Soft. Bowel sounds are normal. She exhibits no distension. There is no tenderness. There is no rebound and no guarding.  Musculoskeletal: Normal range of motion. She exhibits no edema and no tenderness.  Neurological: She is alert and oriented to person, place, and time. Coordination normal.  Skin: Skin is warm and dry. No rash noted. She is not diaphoretic. No erythema. No pallor.  Psychiatric: She has a normal mood and affect. Her behavior is normal. Judgment and thought content normal.    ZOX:[id number]  Rhythm  with sinus arrhythmia cv = 10. WITHIN NORMAL LIMITS  ASSESSMENT AND PLAN

## 2013-03-26 ENCOUNTER — Encounter: Payer: Self-pay | Admitting: Internal Medicine

## 2013-03-26 ENCOUNTER — Ambulatory Visit (INDEPENDENT_AMBULATORY_CARE_PROVIDER_SITE_OTHER): Payer: 59 | Admitting: Internal Medicine

## 2013-03-26 VITALS — BP 136/72 | HR 90 | Temp 98.7°F | Resp 18 | Ht 63.0 in | Wt 251.8 lb

## 2013-03-26 DIAGNOSIS — E7800 Pure hypercholesterolemia, unspecified: Secondary | ICD-10-CM

## 2013-03-26 DIAGNOSIS — R7309 Other abnormal glucose: Secondary | ICD-10-CM

## 2013-03-26 DIAGNOSIS — I10 Essential (primary) hypertension: Secondary | ICD-10-CM

## 2013-03-26 DIAGNOSIS — F17200 Nicotine dependence, unspecified, uncomplicated: Secondary | ICD-10-CM

## 2013-03-26 DIAGNOSIS — F411 Generalized anxiety disorder: Secondary | ICD-10-CM

## 2013-03-26 DIAGNOSIS — Z1239 Encounter for other screening for malignant neoplasm of breast: Secondary | ICD-10-CM

## 2013-03-26 DIAGNOSIS — F419 Anxiety disorder, unspecified: Secondary | ICD-10-CM

## 2013-03-26 DIAGNOSIS — Z720 Tobacco use: Secondary | ICD-10-CM

## 2013-03-26 MED ORDER — AMOXICILLIN 875 MG PO TABS: 875.0000 mg | ORAL_TABLET | Freq: Two times a day (BID) | ORAL | Status: DC

## 2013-03-26 NOTE — Patient Instructions (Signed)
I would recommend taking the mucinex as we discussed.  Flush with saline nasal spray 2-3x/day.  Nasonex - 2 sprays each nostril before bed.  Take amoxicillin (antibiotic) twice a day.  Let me know if any problems.

## 2013-03-26 NOTE — Progress Notes (Signed)
Subjective:    Patient ID: Danielle Yates, female    DOB: Oct 13, 1941, 72 y.o.   MRN: [id number]  HPI 72 year old female with past history of hypertension, hypercholesterolemia and hyperglycemia who comes in today to follow up on these issues as well as to establish care.  Former pt of Dr Nilda Simmer.  She has been seeing Dr Kirke Corin.  Has been having problems with elevated blood pressure recently.  Dr Kirke Corin added diltiazem.  She did not tolerate Carvedilol, amlodipine and metoprolol.  States she felt "goofy".  She is having some problems with increased sinus pressure and congestion.  Has been going on for a while.  Was seen 5 weeks ago for bronchitis.  Took abx.  Feels breathing back to baseline.  No increased sob.  No chest pain or tightness.  She is still smoking.  Using E cigarettes to help her cut back.  Down to 15 cigarettes per day.  Discussed prescription medication.  Desires not to take any other prescription medications. She was involved in a BREATHE program through her work.    Past Medical History  Diagnosis Date  . Arthropathy, unspecified, site unspecified   . Essential hypertension, benign   . External hemorrhoids without mention of complication   . Tobacco use disorder   . Diverticulosis of colon (without mention of hemorrhage)   . Panic disorder without agoraphobia   . Disorder of bone and cartilage, unspecified   . Chronic rhinitis   . Unspecified vitamin D deficiency   . Other abnormal glucose   . Obesity, unspecified   . Pure hypercholesterolemia   . Allergy   . Cataract   . 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 aspirin EC 81 MG tablet Take 81 mg by mouth daily.      . Calcium Carbonate-Vitamin D (CALCIUM 600+D) 600-200 MG-UNIT TABS Take 1 tablet by mouth daily.      [name] diltiazem (CARDIZEM CD) 120 MG 24 hr capsule Take 1 capsule (120 mg total) by mouth  daily.  90 capsule  3  . enalapril (VASOTEC) 20 MG tablet Take 1 tablet in am and 2 tablets in pm  270 tablet  3  . fish oil-omega-3 fatty acids 1000 MG capsule Take 2 g by mouth daily.      [name] FLUoxetine (PROZAC) 20 MG capsule Take 20 mg by mouth daily.      . simvastatin (ZOCOR) 40 MG tablet Take 0.5 tablets (20 mg total) by mouth at bedtime.  90 tablet  3  . triamterene-hydrochlorothiazide (MAXZIDE-25) 37.5-25 MG per tablet Take 1 tablet by mouth daily.      . [DISCONTINUED] beta carotene w/minerals (OCUVITE) tablet Take 1 tablet by mouth daily.      [name] amoxicillin (AMOXIL) 875 MG tablet Take 1 tablet (875 mg total) by mouth 2 (two) times daily.  20 tablet  0   No facility-administered encounter medications on file as of 03/26/2013.    Review of Systems Patient denies any headache, lightheadedness or dizziness. She does report increased sinus pressure and nasal congestion.  Taking mucinex.  Persistent symptoms.   No chest pain, tightness or palpitations.  No increased shortness of breath.  Some cough.  Breathing per her report - back to her baseline.  No nausea or vomiting.  No abdominal pain or cramping.  No bowel change, such as diarrhea,  constipation, BRBPR or melana.  No urine change.   Discussed desire for weight loss.  We discussed portion control and exercise.  Discuss monitoring carbs.  Would like to avoid medication.  Handling stress well.  Is on Prozac for anxiety and panic attacks.  Doing better now.  No recent panic attacks.  Has been very stable on Prozac.      Objective:   Physical Exam Filed Vitals:   03/26/13 0759  BP: 136/72  Pulse: 90  Temp: 98.7 F (37.1 C)  Resp: 18   Blood pressure recheck:  48/70  72 year old female in no acute distress.   HEENT:  Nares- erythematous turbinates.  Oropharynx - without lesions.  TMs visualized - without erythema.  Increased sinus pressure to palpation.  NECK:  Supple.  Nontender.  No audible bruit.  HEART:  Appears to be  regular. LUNGS:  No crackles or wheezing audible.  Respirations even and unlabored.  RADIAL PULSE:  Equal bilaterally.  ABDOMEN:  Soft, nontender.  Bowel sounds present and normal.  No audible abdominal bruit.  EXTREMITIES:  No increased edema present.  DP pulses palpable and equal bilaterally.   SKIN:  No rash.         Assessment & Plan:  PROBABLE SINUSITIS.  Treat with amoxicillin 875mg  bid x 10 days.  Nasonex sample given.  Flush with saline nasal spray as directed.  Mucinex as directed.  Notify me if symptoms worsen or do not resolve.   DESIRE FOR WEIGHT LOSS.  Discussed portion control, exercise and diet adjustment.  Discussed referral to a nutritionist.  Follow.    HEALTH MAINTENANCE.  Set up for a physical - next visit.  States had mammogram 2013.  Due in May (she thinks).  Schedule.  Colonoscopy 2012 - no polyps.  States due follow up 10 years.  Obtain  records to review.   INCREASED PSYCHOSOCIAL STRESSORS.  Feels she is doing well.  Follow.    I spent 45 minutes with this patient and more than 50% of the time was spent in consultation regarding the above.

## 2013-03-27 ENCOUNTER — Encounter: Payer: Self-pay | Admitting: Internal Medicine

## 2013-03-27 NOTE — Assessment & Plan Note (Signed)
Low cholesterol diet and exercise.  On simvastatin.  Check lipid panel and liver function.    

## 2013-03-27 NOTE — Assessment & Plan Note (Signed)
Has a history of anxiety and panic attacks.  Controlled on Prozac.  Follow.  

## 2013-03-27 NOTE — Assessment & Plan Note (Signed)
Blood pressure elevated today.  She brought in readings from home - with blood pressures under better control (130-140/70-80s).  Will hold on changing medication today.  Have her spot check her pressure.  Bring her cuff with her to the next visit.  Get her back in soon to reassess.

## 2013-03-27 NOTE — Assessment & Plan Note (Signed)
Discussed with her today regarding the need to quit and methods/medications to help her quit.  She desires to continue to cut down on her own.

## 2013-03-27 NOTE — Assessment & Plan Note (Signed)
Check fasting glucose.  Low carb diet.

## 2013-05-23 ENCOUNTER — Other Ambulatory Visit: Payer: Self-pay | Admitting: Internal Medicine

## 2013-05-24 LAB — BASIC METABOLIC PANEL
BUN: 14 mg/dL (ref 8–27)
CO2: 26 mmol/L (ref 18–29)
Calcium: 9.5 mg/dL (ref 8.6–10.2)
Creatinine, Ser: 0.77 mg/dL (ref 0.57–1.00)
GFR calc non Af Amer: 78 mL/min/{1.73_m2} (ref >59)
Glucose: 99 mg/dL (ref 65–99)
Sodium: 139 mmol/L (ref 134–144)

## 2013-05-24 LAB — HEPATIC FUNCTION PANEL
ALT: 18 IU/L (ref 0–32)
AST: 22 IU/L (ref 0–40)
Albumin: 4.3 g/dL (ref 3.5–4.8)
Alkaline Phosphatase: 56 IU/L (ref 39–117)

## 2013-05-24 LAB — MICROALBUMIN / CREATININE URINE RATIO
Creatinine, Ur: 84.3 mg/dL (ref 15.0–328.0)
MICROALB/CREAT RATIO: 2.5 mg/g creat (ref 0.0–30.0)
Microalbumin, Urine: 2.1 ug/mL (ref 0.0–17.0)

## 2013-05-24 LAB — LIPID PANEL W/O CHOL/HDL RATIO
Cholesterol, Total: 195 mg/dL (ref 100–199)
HDL: 55 mg/dL (ref >39)
VLDL Cholesterol Cal: 20 mg/dL (ref 5–40)

## 2013-05-28 ENCOUNTER — Ambulatory Visit (INDEPENDENT_AMBULATORY_CARE_PROVIDER_SITE_OTHER): Payer: 59 | Admitting: Internal Medicine

## 2013-05-28 ENCOUNTER — Encounter: Payer: Self-pay | Admitting: Internal Medicine

## 2013-05-28 VITALS — BP 140/80 | HR 62 | Temp 98.6°F | Ht 63.0 in | Wt 246.5 lb

## 2013-05-28 DIAGNOSIS — Z720 Tobacco use: Secondary | ICD-10-CM

## 2013-05-28 DIAGNOSIS — F419 Anxiety disorder, unspecified: Secondary | ICD-10-CM

## 2013-05-28 DIAGNOSIS — F411 Generalized anxiety disorder: Secondary | ICD-10-CM

## 2013-05-28 DIAGNOSIS — E7800 Pure hypercholesterolemia, unspecified: Secondary | ICD-10-CM

## 2013-05-28 DIAGNOSIS — F17200 Nicotine dependence, unspecified, uncomplicated: Secondary | ICD-10-CM

## 2013-05-28 DIAGNOSIS — I10 Essential (primary) hypertension: Secondary | ICD-10-CM

## 2013-05-28 MED ORDER — FLUOXETINE HCL 20 MG PO CAPS: 20.0000 mg | ORAL_CAPSULE | Freq: Every day | ORAL | Status: DC

## 2013-05-28 MED ORDER — TRIAMTERENE-HCTZ 37.5-25 MG PO TABS: 1.0000 | ORAL_TABLET | Freq: Every day | ORAL | Status: DC

## 2013-05-28 NOTE — Progress Notes (Signed)
Subjective:    Patient ID: Danielle Yates, female    DOB: 05/24/1941, 72 y.o.   MRN: [id number]  HPI 72 year old female with past history of hypertension, hypercholesterolemia and hyperglycemia who comes in today to follow up on these issues as well as for a complete physical exam.  She has been seeing Dr Kirke Corin.  Had been having problems with elevated blood pressure.  Dr Kirke Corin added diltiazem.  She did not tolerate Carvedilol, amlodipine and metoprolol.  No increased sob.  No chest pain or tightness.  She is still smoking.  Using E cigarettes to help her cut back.  Was down to 15 cigarettes per day.  Had previously discussed prescription medication.  Desires not to take any other prescription medications. She was involved in a BREATHE program through her work.  She is off her cholesterol medication.  Cholesterol just checked and improved.  Has lost weight.  [facility] to [facility].  Planes to continue.  Plans to exercise more.  Blood pressures on outside checks averaging mostly < 140/75-80.     Past Medical History  Diagnosis Date  . Arthropathy, unspecified, site unspecified   . Essential hypertension, benign   . External hemorrhoids without mention of complication   . Tobacco use disorder   . Diverticulosis of colon (without mention of hemorrhage)   . Panic disorder without agoraphobia   . Disorder of bone and cartilage, unspecified   . Chronic rhinitis   . Unspecified vitamin D deficiency   . Other abnormal glucose   . Obesity, unspecified   . Pure hypercholesterolemia   . Allergy   . Cataract   . 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 aspirin EC 81 MG tablet Take 81 mg by mouth daily.      . Calcium Carbonate-Vitamin D (CALCIUM 600+D) 600-200 MG-UNIT TABS Take 1 tablet by mouth daily.      [name] diltiazem (CARDIZEM CD) 120 MG 24 hr capsule Take 1 capsule  (120 mg total) by mouth daily.  90 capsule  3  . enalapril (VASOTEC) 20 MG tablet Take 1 tablet in am and 2 tablets in pm  270 tablet  3  . FLUoxetine (PROZAC) 20 MG capsule Take 1 capsule (20 mg total) by mouth daily.  90 capsule  3  . triamterene-hydrochlorothiazide (MAXZIDE-25) 37.5-25 MG per tablet Take 1 tablet by mouth daily.  90 tablet  3  . [DISCONTINUED] FLUoxetine (PROZAC) 20 MG capsule Take 20 mg by mouth daily.      . [DISCONTINUED] triamterene-hydrochlorothiazide (MAXZIDE-25) 37.5-25 MG per tablet Take 1 tablet by mouth daily.      . [DISCONTINUED] amoxicillin (AMOXIL) 875 MG tablet Take 1 tablet (875 mg total) by mouth 2 (two) times daily.  20 tablet  0  . [DISCONTINUED] fish oil-omega-3 fatty acids 1000 MG capsule Take 2 g by mouth daily.      . [DISCONTINUED] simvastatin (ZOCOR) 40 MG tablet Take 0.5 tablets (20 mg total) by mouth at bedtime.  90 tablet  3   No facility-administered encounter medications on file as of 05/28/2013.    Review of Systems Patient denies any headache, lightheadedness or dizziness.  No sinus or allergy symptoms.  No chest pain, tightness or palpitations.  No increased shortness of breath.  No increased cough.  Breathing per her report - back to her baseline.  No nausea or vomiting.  No abdominal pain or cramping.  No bowel change, such as diarrhea, constipation, BRBPR or melana.  No urine change.   Handling stress well.  Is on Prozac for anxiety and panic attacks.  Doing better now.  No recent panic attacks.  Has been very stable on Prozac.      Objective:   Physical Exam  Filed Vitals:   05/28/13 1553  BP: 140/80  Pulse: 62  Temp: 98.6 F (37 C)   Blood pressure recheck:  136/72, pulse 54  72 year old female in no acute distress.   HEENT:  Nares- clear.  Oropharynx - without lesions. NECK:  Supple.  Nontender.  No audible bruit.  HEART:  Appears to be regular. LUNGS:  No crackles or wheezing audible.  Respirations even and unlabored.   RADIAL PULSE:  Equal bilaterally.    BREASTS:  No nipple discharge or nipple retraction present.  Could not appreciate any distinct nodules or axillary adenopathy.  ABDOMEN:  Soft, nontender.  Bowel sounds present and normal.  No audible abdominal bruit.  GU:  Normal external genitalia.  Vaginal vault without lesions.  Could not appreciate any adnexal masses or tenderness.   RECTAL:  Heme negative.   EXTREMITIES:  No increased edema present.  DP pulses palpable and equal bilaterally.          Assessment & Plan:  DESIRE FOR WEIGHT LOSS.  Going to [facility].  Has lost weight.  Plans to start exercising.  Follow.     HEALTH MAINTENANCE.  Physical today.  Just recently had her mammogram for 2014.  Obtain results.  She states everything checked out fine.  Colonoscopy 2012 - no polyps.  States due follow up 10 years.   INCREASED PSYCHOSOCIAL STRESSORS.  Feels she is doing well.  Follow.  On Prozac.

## 2013-05-28 NOTE — Assessment & Plan Note (Signed)
Discussed with her previously regarding the need to quit and methods/medications to help her quit.  She desires to continue to cut down on her own.  Follow.

## 2013-05-28 NOTE — Assessment & Plan Note (Signed)
Has a history of anxiety and panic attacks.  Controlled on Prozac.  Follow.  

## 2013-05-28 NOTE — Assessment & Plan Note (Signed)
Low cholesterol diet and exercise.  Off simvastatin.  Follow lipid panel and liver function.  Last lipid panel improved.  Follow.

## 2013-05-28 NOTE — Assessment & Plan Note (Signed)
Blood pressure as outlined.  Overall doing ok.  Follow.  Follow metabolic panel.  Same medications.

## 2013-09-28 ENCOUNTER — Telehealth: Payer: Self-pay | Admitting: Internal Medicine

## 2013-09-28 NOTE — Telephone Encounter (Signed)
Pt notified labs ok via my chart.  

## 2013-10-01 ENCOUNTER — Encounter: Payer: Self-pay | Admitting: Internal Medicine

## 2013-10-01 ENCOUNTER — Ambulatory Visit (INDEPENDENT_AMBULATORY_CARE_PROVIDER_SITE_OTHER): Payer: 59 | Admitting: Internal Medicine

## 2013-10-01 VITALS — BP 130/80 | HR 71 | Temp 98.5°F | Ht 63.0 in | Wt 238.0 lb

## 2013-10-01 DIAGNOSIS — F419 Anxiety disorder, unspecified: Secondary | ICD-10-CM

## 2013-10-01 DIAGNOSIS — F411 Generalized anxiety disorder: Secondary | ICD-10-CM

## 2013-10-01 DIAGNOSIS — I10 Essential (primary) hypertension: Secondary | ICD-10-CM

## 2013-10-01 DIAGNOSIS — Z720 Tobacco use: Secondary | ICD-10-CM

## 2013-10-01 DIAGNOSIS — E7800 Pure hypercholesterolemia, unspecified: Secondary | ICD-10-CM

## 2013-10-01 DIAGNOSIS — M25512 Pain in left shoulder: Secondary | ICD-10-CM

## 2013-10-01 DIAGNOSIS — R739 Hyperglycemia, unspecified: Secondary | ICD-10-CM

## 2013-10-01 DIAGNOSIS — M25519 Pain in unspecified shoulder: Secondary | ICD-10-CM

## 2013-10-01 DIAGNOSIS — F17200 Nicotine dependence, unspecified, uncomplicated: Secondary | ICD-10-CM

## 2013-10-01 DIAGNOSIS — R7309 Other abnormal glucose: Secondary | ICD-10-CM

## 2013-10-01 NOTE — Telephone Encounter (Signed)
Mailed unread message to pt  

## 2013-10-02 ENCOUNTER — Encounter: Payer: Self-pay | Admitting: Internal Medicine

## 2013-10-02 ENCOUNTER — Ambulatory Visit (INDEPENDENT_AMBULATORY_CARE_PROVIDER_SITE_OTHER): Payer: 59 | Admitting: Podiatry

## 2013-10-02 ENCOUNTER — Ambulatory Visit (INDEPENDENT_AMBULATORY_CARE_PROVIDER_SITE_OTHER): Payer: 59

## 2013-10-02 ENCOUNTER — Encounter: Payer: Self-pay | Admitting: Podiatry

## 2013-10-02 VITALS — Ht 64.0 in | Wt 238.0 lb

## 2013-10-02 DIAGNOSIS — M25512 Pain in left shoulder: Secondary | ICD-10-CM | POA: Insufficient documentation

## 2013-10-02 DIAGNOSIS — R52 Pain, unspecified: Secondary | ICD-10-CM

## 2013-10-02 DIAGNOSIS — Z1239 Encounter for other screening for malignant neoplasm of breast: Secondary | ICD-10-CM

## 2013-10-02 DIAGNOSIS — S92919A Unspecified fracture of unspecified toe(s), initial encounter for closed fracture: Secondary | ICD-10-CM

## 2013-10-02 NOTE — Assessment & Plan Note (Signed)
A1c just checked 09/26/13 - 5.8.  Continue diet adjustment and weight loss.  Follow.

## 2013-10-02 NOTE — Progress Notes (Signed)
N  SORE L  RT. FOOT 5TH TOE D  1 MONTH O  SLOWLY C  SAME A  PRESSURE T  N/A

## 2013-10-02 NOTE — Assessment & Plan Note (Signed)
Blood pressure as outlined.  Doing well.  Follow.  Follow metabolic panel.  Same medications.

## 2013-10-02 NOTE — Assessment & Plan Note (Signed)
Increased pain with certain position changes and movements.  Some increased pain with full extension.  Appears to have minimal soft tissue swelling - anterior.  Conservative measures.  Gentle use of ibuprofen and alternate with Tylenol.  Notify me if persistent.

## 2013-10-02 NOTE — Assessment & Plan Note (Signed)
Low cholesterol diet and exercise.  Follow lipid panel and liver function.  Last lipid panel improved.

## 2013-10-02 NOTE — Progress Notes (Signed)
Subjective:    Patient ID: Danielle Yates, female    DOB: 11/20/1941, 72 y.o.   MRN: [id number]  HPI 72 year old female with past history of hypertension, hypercholesterolemia and hyperglycemia who comes in today for a scheduled follow up.   She has been seeing Dr Kirke Corin.  Had been having problems with elevated blood pressure.  Dr Kirke Corin added diltiazem.  She did not tolerate Carvedilol, amlodipine and metoprolol.  Doing well with this regimen.  Blood pressure controlled.  States averaging <140/72-80.  No increased sob.  No chest pain or tightness.  She is still smoking.  Was using E cigarettes to help her cut back.  Had previously discussed prescription medication.  Desires not to take any other prescription medications. She was involved in a BREATHE program through her work.  Cholesterol just checked and improved.  Has lost weight.  Was going to [facility].  Now adjusting her diet and exercising on her own.  She did drop a jar of pickles on her right fifth toe one month ago.  Still sore.  Planning to see Dr Charlsie Merles tomorrow.  She also noticed some increased left shoulder pain - starting yesterday.  Notices more with certain positions.  No trauma.  Does carry a heavy purse and picks up heavy objects.       Past Medical History  Diagnosis Date  . Arthropathy, unspecified, site unspecified   . Essential hypertension, benign   . External hemorrhoids without mention of complication   . Tobacco use disorder   . Diverticulosis of colon (without mention of hemorrhage)   . Panic disorder without agoraphobia   . Disorder of bone and cartilage, unspecified   . Chronic rhinitis   . Unspecified vitamin D deficiency   . Other abnormal glucose   . Obesity, unspecified   . Pure hypercholesterolemia   . Allergy   . Cataract   . 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  aspirin EC 81 MG tablet Take 81 mg by mouth daily.      . Calcium Carbonate-Vitamin D (CALCIUM 600+D) 600-200 MG-UNIT TABS Take 1 tablet by mouth daily.      [name] diltiazem (CARDIZEM CD) 120 MG 24 hr capsule Take 1 capsule (120 mg total) by mouth daily.  90 capsule  3  . enalapril (VASOTEC) 20 MG tablet Take 1 tablet in am and 2 tablets in pm  270 tablet  3  . FLUoxetine (PROZAC) 20 MG capsule Take 1 capsule (20 mg total) by mouth daily.  90 capsule  3  . simvastatin (ZOCOR) 40 MG tablet Take 10 mg by mouth 2 (two) times a week.      . triamterene-hydrochlorothiazide (MAXZIDE-25) 37.5-25 MG per tablet Take 1 tablet by mouth daily.  90 tablet  3   No facility-administered encounter medications on file as of 10/01/2013.    Review of Systems Patient denies any headache, lightheadedness or dizziness.  No sinus or allergy symptoms.  No chest pain, tightness or palpitations.  No increased shortness of breath.  No increased cough.  Breathing per her report - stable.  No nausea or vomiting.  No abdominal pain or cramping.  No bowel change, such as diarrhea, constipation, BRBPR or melana.  No urine change.   Handling stress well.  Is on Prozac for anxiety and panic attacks.   No recent  panic attacks.  Has been very stable on Prozac.  MSK issues as outlined above.      Objective:   Physical Exam  Filed Vitals:   10/01/13 1357  BP: 130/80  Pulse: 71  Temp: 98.5 F (36.9 C)   Blood pressure recheck:  73/32  72 year old female in no acute distress.   HEENT:  Nares- clear.  Oropharynx - without lesions. NECK:  Supple.  Nontender.  No audible bruit.  HEART:  Appears to be regular. LUNGS:  No crackles or wheezing audible.  Respirations even and unlabored.  RADIAL PULSE:  Equal bilaterally.    ABDOMEN:  Soft, nontender.  Bowel sounds present and normal.  No audible abdominal bruit.   EXTREMITIES:  No increased edema present.  DP pulses palpable and equal bilaterally.          Assessment & Plan:   DESIRE FOR WEIGHT LOSS.  Has lost weight.  Follow.     HEALTH MAINTENANCE.  Physical 05/28/13.   Mammogram 05/07/13 - Birads I.   Colonoscopy 2012 - no polyps.  States due follow up 10 years.   INCREASED PSYCHOSOCIAL STRESSORS.  Feels she is doing well.  Follow.  On Prozac.

## 2013-10-02 NOTE — Assessment & Plan Note (Signed)
Discussed with her previously regarding the need to quit and methods/medications to help her quit.  She desires to continue to cut down on her own.  Follow.

## 2013-10-02 NOTE — Progress Notes (Signed)
Subjective:     Patient ID: Danielle Yates, female   DOB: [birth year], 72 y.o.   MRN: [id number]  Foot Pain   patient states that she dropped something on her fifth toe right foot 4 weeks ago and it is still swollen and she wants to make sure it is not broken  Review of Systems  All other systems reviewed and are negative.       Objective:   Physical Exam  Nursing note and vitals reviewed. Constitutional: She is oriented to person, place, and time.  Cardiovascular: Intact distal pulses.   Musculoskeletal: Normal range of motion.  Neurological: She is oriented to person, place, and time.  Skin: Skin is warm.   patient is found to have edema in the fifth toe right with moderate discomfort when expressed no other edema noted    Assessment:     Possible fracture fifth toe right versus contusion    Plan:     X-ray reviewed and advice on light shoe gear given. We'll consider cortisone injection if in 4 weeks if symptoms continue

## 2013-10-02 NOTE — Assessment & Plan Note (Signed)
Has a history of anxiety and panic attacks.  Controlled on Prozac.  Follow.  

## 2013-10-11 ENCOUNTER — Other Ambulatory Visit: Payer: Self-pay | Admitting: Cardiovascular Disease

## 2013-10-16 ENCOUNTER — Encounter: Payer: Self-pay | Admitting: Internal Medicine

## 2013-10-18 ENCOUNTER — Encounter: Payer: Self-pay | Admitting: Adult Health

## 2013-10-18 ENCOUNTER — Ambulatory Visit (INDEPENDENT_AMBULATORY_CARE_PROVIDER_SITE_OTHER): Payer: 59 | Admitting: Adult Health

## 2013-10-18 VITALS — BP 124/80 | HR 65 | Temp 98.1°F | Wt 235.0 lb

## 2013-10-18 DIAGNOSIS — J329 Chronic sinusitis, unspecified: Secondary | ICD-10-CM

## 2013-10-18 DIAGNOSIS — J069 Acute upper respiratory infection, unspecified: Secondary | ICD-10-CM | POA: Insufficient documentation

## 2013-10-18 MED ORDER — AMOXICILLIN-POT CLAVULANATE 875-125 MG PO TABS: 1.0000 | ORAL_TABLET | Freq: Two times a day (BID) | ORAL | Status: DC

## 2013-10-18 NOTE — Patient Instructions (Signed)

## 2013-10-18 NOTE — Assessment & Plan Note (Signed)
Augmentin bid x 10 days. RTC if no improvement within 4-5 days. 

## 2013-10-18 NOTE — Progress Notes (Signed)
  Subjective:    Patient ID: Danielle Yates, female    DOB: Dec 24, 1940, 72 y.o.   MRN: [id number]  HPI  Patient is a 36 -year-old female who presents to clinic with left ear pain, pressure behind left eye, clear cough, bloody nasal drainage. Afebrile. Bronchitis x 1-2 weeks. Bloody drainage from her left nostril. Ongoing tobacco abuse. Not ready to quit.   Current Outpatient Prescriptions on File Prior to Visit  Medication Sig Dispense Refill  . aspirin EC 81 MG tablet Take 81 mg by mouth daily.      . Calcium Carbonate-Vitamin D (CALCIUM 600+D) 600-200 MG-UNIT TABS Take 1 tablet by mouth daily.      [name] diltiazem (CARDIZEM CD) 120 MG 24 hr capsule Take 1 capsule by mouth  daily  90 capsule  3  . enalapril (VASOTEC) 20 MG tablet Take 1 tablet in am and 2 tablets in pm  270 tablet  3  . FLUoxetine (PROZAC) 20 MG capsule Take 1 capsule (20 mg total) by mouth daily.  90 capsule  3  . simvastatin (ZOCOR) 40 MG tablet Take 10 mg by mouth 2 (two) times a week.      . triamterene-hydrochlorothiazide (MAXZIDE-25) 37.5-25 MG per tablet Take 1 tablet by mouth daily.  90 tablet  3   No current facility-administered medications on file prior to visit.     Review of Systems  Constitutional: Negative for fever and chills.  HENT: Positive for ear pain, postnasal drip, sinus pressure and sore throat.   Respiratory: Positive for cough and wheezing.        Objective:   Physical Exam  Constitutional: She is oriented to person, place, and time. No distress.  Pleasant 72 y/o female in NAD  HENT:  Head: Normocephalic and atraumatic.  Right Ear: External ear normal.  Left ear with cerumen impaction obstructing view of TM. Oropharyngeal erythema  Cardiovascular: Normal rate, regular rhythm and normal heart sounds.   Pulmonary/Chest: Effort normal and breath sounds normal. No respiratory distress. She has no wheezes.  Anterior chest bilateral rhonchi. Clears with coughing.  Lymphadenopathy:    She has  no cervical adenopathy.  Neurological: She is alert and oriented to person, place, and time.  Skin: Skin is warm and dry.  Psychiatric: She has a normal mood and affect. Her behavior is normal. Judgment and thought content normal.          Assessment & Plan:

## 2013-12-14 ENCOUNTER — Other Ambulatory Visit: Payer: Self-pay | Admitting: Cardiovascular Disease

## 2014-02-04 ENCOUNTER — Ambulatory Visit (INDEPENDENT_AMBULATORY_CARE_PROVIDER_SITE_OTHER): Payer: 59 | Admitting: Internal Medicine

## 2014-02-04 ENCOUNTER — Encounter: Payer: Self-pay | Admitting: Internal Medicine

## 2014-02-04 VITALS — BP 140/80 | HR 66 | Temp 98.6°F | Ht 63.0 in | Wt 241.8 lb

## 2014-02-04 DIAGNOSIS — R739 Hyperglycemia, unspecified: Secondary | ICD-10-CM

## 2014-02-04 DIAGNOSIS — Z720 Tobacco use: Secondary | ICD-10-CM

## 2014-02-04 DIAGNOSIS — R7309 Other abnormal glucose: Secondary | ICD-10-CM

## 2014-02-04 DIAGNOSIS — E7800 Pure hypercholesterolemia, unspecified: Secondary | ICD-10-CM

## 2014-02-04 DIAGNOSIS — J069 Acute upper respiratory infection, unspecified: Secondary | ICD-10-CM

## 2014-02-04 DIAGNOSIS — F419 Anxiety disorder, unspecified: Secondary | ICD-10-CM

## 2014-02-04 DIAGNOSIS — F17200 Nicotine dependence, unspecified, uncomplicated: Secondary | ICD-10-CM

## 2014-02-04 DIAGNOSIS — F411 Generalized anxiety disorder: Secondary | ICD-10-CM

## 2014-02-04 DIAGNOSIS — I10 Essential (primary) hypertension: Secondary | ICD-10-CM

## 2014-02-04 MED ORDER — AMOXICILLIN 875 MG PO TABS: 875.0000 mg | ORAL_TABLET | Freq: Two times a day (BID) | ORAL | Status: DC

## 2014-02-04 MED ORDER — FLUTICASONE PROPIONATE HFA 110 MCG/ACT IN AERO: INHALATION_SPRAY | RESPIRATORY_TRACT | Status: DC

## 2014-02-04 NOTE — Progress Notes (Signed)
Subjective:    Patient ID: Danielle Yates, female    DOB: Feb 18, 1941, 73 y.o.   MRN: [id number]  HPI 73 year old female with past history of hypertension, hypercholesterolemia and hyperglycemia who comes in today for a scheduled follow up.   She has been seeing Dr Fletcher Anon.  Had been having problems with elevated blood pressure.  Dr Fletcher Anon added diltiazem.  She did not tolerate Carvedilol, amlodipine and metoprolol.  Doing well with this regimen.  States blood pressure controlled.  States averaging <443 systolic readings.   No increased sob.  No chest pain or tightness.  She is still smoking.  Was using E cigarettes to help her cut back.  Had previously discussed prescription medication.  Discussed again today.  Desires not to take any other prescription medications. She was involved in a BREATHE program through her work.  Has cut down.  Has lost weight.  Was going to [facility].  Now adjusting her diet and exercising on her own.  States that starting last week, she started having increased chest congestion and cough.  Cough is mostly non productive.  Increased coughing.  No fever.  No nausea or vomiting.        Past Medical History  Diagnosis Date  . Arthropathy, unspecified, site unspecified   . Essential hypertension, benign   . External hemorrhoids without mention of complication   . Tobacco use disorder   . Diverticulosis of colon (without mention of hemorrhage)   . Panic disorder without agoraphobia   . Disorder of bone and cartilage, unspecified   . Chronic rhinitis   . Unspecified vitamin D deficiency   . Other abnormal glucose   . Obesity, unspecified   . Pure hypercholesterolemia   . Allergy   . Cataract   . 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 aspirin EC 81 MG tablet Take 81 mg by mouth daily.  . Calcium Carbonate-Vitamin D (CALCIUM 600+D) 600-200 MG-UNIT TABS  Take 1 tablet by mouth daily.  [name] diltiazem (CARDIZEM CD) 120 MG 24 hr capsule Take 1 capsule by mouth  daily  . enalapril (VASOTEC) 20 MG tablet Take 1 tablet by mouth  every morning and 2 tablets by mouth every evening  . FLUoxetine (PROZAC) 20 MG capsule Take 1 capsule (20 mg total) by mouth daily.  [name] triamterene-hydrochlorothiazide (MAXZIDE-25) 37.5-25 MG per tablet Take 1 tablet by mouth daily.  . simvastatin (ZOCOR) 40 MG tablet Take 10 mg by mouth 2 (two) times a week.  . [DISCONTINUED] amoxicillin-clavulanate (AUGMENTIN) 875-125 MG per tablet Take 1 tablet by mouth 2 (two) times daily.    Review of Systems Patient denies any headache, lightheadedness or dizziness.  No chest pain, tightness or palpitations.  No increased shortness of breath.  Does report increased cough and congestion.  No nausea or vomiting.  No abdominal pain or cramping.  No bowel change, such as diarrhea, constipation, BRBPR or melana.  No urine change.   Handling stress well. Is on Prozac for anxiety and panic attacks.   No recent panic attacks.  Has been very stable on Prozac.  Foot pain better.  Previous fracture.  Saw podiatry.        Objective:   Physical Exam  Filed Vitals:   02/04/14 1419  BP: 140/80  Pulse: 66  Temp: 98.6 F (11 C)   73 year old  female in no acute distress.   HEENT:  Nares- clear.  Oropharynx - without lesions. NECK:  Supple.  Nontender.  No audible bruit.  HEART:  Appears to be regular. LUNGS:  No crackles or wheezing audible.  Respirations even and unlabored.  RADIAL PULSE:  Equal bilaterally.    ABDOMEN:  Soft, nontender.  Bowel sounds present and normal.  No audible abdominal bruit.   EXTREMITIES:  No increased edema present.  DP pulses palpable and equal bilaterally.          Assessment & Plan:  DESIRE FOR WEIGHT LOSS.  Has lost weight.  Follow.   Continues to lose.    HEALTH MAINTENANCE.  Physical 05/28/13.   Mammogram 05/07/13 - Birads I.   Colonoscopy 2012 - no polyps.   States due follow up 10 years.   INCREASED PSYCHOSOCIAL STRESSORS.  Feels she is doing well.  Follow.  On Prozac.

## 2014-02-04 NOTE — Progress Notes (Signed)
Pre-visit discussion using our clinic review tool. No additional management support is needed unless otherwise documented below in the visit note.  

## 2014-02-04 NOTE — Patient Instructions (Signed)
mucinex in the am and robitussin in the pm.  Saline nasal spray - flush nose at least 2-3x/day.   Flovent inhaler - 2 puffs twice a day.  Rinse mouth after use.   Take the antibiotic twice a day.

## 2014-02-07 ENCOUNTER — Encounter: Payer: Self-pay | Admitting: Internal Medicine

## 2014-02-07 NOTE — Assessment & Plan Note (Signed)
Discussed with her previously regarding the need to quit and methods/medications to help her quit.  Discussed again today.  She desires to continue to cut down on her own.  Follow.    

## 2014-02-07 NOTE — Assessment & Plan Note (Signed)
Cough and congestion as outlined.  Treat with amoxicillin as directed.   Flovent 171mcg as directed.  mucinex in the am and robitussin in the evening.  Follow.

## 2014-02-07 NOTE — Assessment & Plan Note (Signed)
Blood pressure as outlined.  Borderline today.  Treat infection.  Follow.  Follow metabolic panel.  Same medications.  Has an appt with cardiology next week.  Will recheck then.

## 2014-02-07 NOTE — Assessment & Plan Note (Signed)
Has a history of anxiety and panic attacks.  Controlled on Prozac.  Follow.  

## 2014-02-07 NOTE — Assessment & Plan Note (Signed)
Low cholesterol diet and exercise.  Follow lipid panel and liver function.   

## 2014-02-07 NOTE — Assessment & Plan Note (Addendum)
Continue diet adjustment and weight loss.  Follow.  A1c just checked - 5.9.

## 2014-02-11 ENCOUNTER — Ambulatory Visit (INDEPENDENT_AMBULATORY_CARE_PROVIDER_SITE_OTHER): Payer: 59 | Admitting: Cardiovascular Disease

## 2014-02-11 ENCOUNTER — Encounter: Payer: Self-pay | Admitting: Cardiovascular Disease

## 2014-02-11 VITALS — BP 129/82 | HR 67 | Ht 64.0 in | Wt 241.5 lb

## 2014-02-11 DIAGNOSIS — I10 Essential (primary) hypertension: Secondary | ICD-10-CM

## 2014-02-11 DIAGNOSIS — E7800 Pure hypercholesterolemia, unspecified: Secondary | ICD-10-CM

## 2014-02-11 DIAGNOSIS — R9431 Abnormal electrocardiogram [ECG] [EKG]: Secondary | ICD-10-CM

## 2014-02-11 NOTE — Patient Instructions (Signed)
Continue same medications.   Follow up as needed.  

## 2014-02-11 NOTE — Assessment & Plan Note (Signed)
Lipid profile worsened after stopping simvastatin. I advised her to increase her activities and exercise. Consider resuming the medication if LDL remains above 100.

## 2014-02-11 NOTE — Assessment & Plan Note (Signed)
Blood pressure is well controlled on current medications. She is tolerating diltiazem with no reported side effects. Continue current medications. Followup as needed

## 2014-02-11 NOTE — Progress Notes (Signed)
HPI  This is a pleasant 73 year old Caucasian female who is here today for a followup visit regarding refractory  hypertension. The patient has no previous cardiac history.  She has family history of coronary artery disease. Her father died at the age of 51 from myocardial infarction. Her brother had myocardial infarction at the age of 49 and subsequently underwent CABG. She did not tolerate metoprolol due to bradycardia and fatigue. She did not tolerate amlodipine and carvedilol due to headache and dizziness.  She underwent renal duplex ultrasound which showed no evidence of renal artery stenosis. Echocardiogram showed normal LV systolic function. Blood pressure has been reasonably controlled on current medications. She reports no chest pain or dyspnea.    Allergies  Allergen Reactions  . Codeine Nausea Only  . Crestor [Rosuvastatin] Other (See Comments)    PER PT MADE ME FEEL" WEIRD"  . Nickel Rash     Current Outpatient Prescriptions on File Prior to Visit  Medication Sig Dispense Refill  . amoxicillin (AMOXIL) 875 MG tablet Take 1 tablet (875 mg total) by mouth 2 (two) times daily.  20 tablet  0  . aspirin EC 81 MG tablet Take 81 mg by mouth daily.      . Calcium Carbonate-Vitamin D (CALCIUM 600+D) 600-200 MG-UNIT TABS Take 1 tablet by mouth daily.      [name] diltiazem (CARDIZEM CD) 120 MG 24 hr capsule Take 1 capsule by mouth  daily  90 capsule  3  . enalapril (VASOTEC) 20 MG tablet Take 1 tablet by mouth  every morning and 2 tablets by mouth every evening  90 tablet  3  . FLUoxetine (PROZAC) 20 MG capsule Take 1 capsule (20 mg total) by mouth daily.  90 capsule  3  . triamterene-hydrochlorothiazide (MAXZIDE-25) 37.5-25 MG per tablet Take 1 tablet by mouth daily.  90 tablet  3   No current facility-administered medications on file prior to visit.     Past Medical History  Diagnosis Date  . Arthropathy, unspecified, site unspecified   . Essential hypertension, benign   .  External hemorrhoids without mention of complication   . Tobacco use disorder   . Diverticulosis of colon (without mention of hemorrhage)   . Panic disorder without agoraphobia   . Disorder of bone and cartilage, unspecified   . Chronic rhinitis   . Unspecified vitamin D deficiency   . Other abnormal glucose   . Obesity, unspecified   . Pure hypercholesterolemia   . Allergy   . Cataract   . 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 Tonsillectomy and adenoidectomy  1946  . Vaginal hysterectomy  1982    fibroid and endometriosis; one ovary remaining  . Cholecystectomy  1985    with open wound  . Appendectomy  1985  . Removed spot on face      [name]  . Eye surgery  2011    CATARACTS -  BOTH     Family History  Problem Relation Age of Onset  . Hypertension Sister   . Heart disease Brother   . Hypertension Brother   . Hyperlipidemia Brother   . Aneurysm Mother   . Rheumatic fever Mother     as an adult  . Heart disease Father     MI x 2     History   Social History  . Marital Status: Widowed  Spouse Name: N/A    Number of Children: 0  . Years of Education: college   Occupational History  . 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 Smoking status: Current Every Day Smoker -- 0.50 packs/day for 50 years    Types: Cigarettes  . Smokeless tobacco: Never Used  . Alcohol Use: No  . Drug Use: No  . Sexual Activity: Not on file   Other Topics Concern  . Not on file   Social History Narrative   Lives alone. Widowed after 23 years. Not dating. Has dog. Exercise: Walking; light has not been walking as she used to before her 2 dogs passed away. Tries to walk some. Caffeine: Carbonated beverages, 3 servings/day;Diet coke. 3 a day.      10/02/12  PER PATIENT'S LAVENDER FORM: WALK 5 DAYS/WEEK FOR 30 MINUTES       PHYSICAL EXAM   BP 129/82  Pulse 67   Ht 5\' 4"  (1.626 m)  Wt 241 lb 8 oz (109.544 kg)  BMI 41.43 kg/m2 Constitutional: She is oriented to person, place, and time. She appears well-developed and well-nourished. No distress.  HENT: No nasal discharge.  Head: Normocephalic and atraumatic.  Eyes: Pupils are equal and round. Right eye exhibits no discharge. Left eye exhibits no discharge.  Neck: Normal range of motion. Neck supple. No JVD present. No thyromegaly present.  Cardiovascular: Normal rate, regular rhythm, normal heart sounds. Exam reveals no gallop and no friction rub. There is 1/ 6 systolic ejection murmur at the aortic area Pulmonary/Chest: Effort normal and breath sounds normal. No stridor. No respiratory distress. She has no wheezes. She has no rales. She exhibits no tenderness.  Abdominal: Soft. Bowel sounds are normal. She exhibits no distension. There is no tenderness. There is no rebound and no guarding.  Musculoskeletal: Normal range of motion. She exhibits no edema and no tenderness.  Neurological: She is alert and oriented to person, place, and time. Coordination normal.  Skin: Skin is warm and dry. No rash noted. She is not diaphoretic. No erythema. No pallor.  Psychiatric: She has a normal mood and affect. Her behavior is normal. Judgment and thought content normal.    TLX:BWIOM  Rhythm  with sinus arrhythmia cv = 10. WITHIN NORMAL LIMITS  ASSESSMENT AND PLAN

## 2014-02-22 ENCOUNTER — Encounter: Payer: Self-pay | Admitting: Internal Medicine

## 2014-03-01 ENCOUNTER — Encounter: Payer: Self-pay | Admitting: Podiatry

## 2014-03-01 ENCOUNTER — Ambulatory Visit: Payer: 59 | Admitting: Podiatry

## 2014-03-01 VITALS — BP 135/86 | HR 76 | Resp 16

## 2014-03-01 DIAGNOSIS — L600 Ingrowing nail: Secondary | ICD-10-CM

## 2014-03-01 NOTE — Patient Instructions (Signed)

## 2014-03-01 NOTE — Progress Notes (Signed)
Left great toenail ingrown, medial corner

## 2014-03-01 NOTE — Progress Notes (Signed)
Subjective:     Patient ID: Danielle Yates, female   DOB: 12-21-1940, 73 y.o.   MRN: [id number]  HPI patient states that this left big toenail is really bothering me and making it hard for me to wear shoes. I tried to trim it and soak it without relief   Review of Systems     Objective:   Physical Exam Neurovascular status intact with incurvated hallux nail border left hallux medial. I noted there to be good Fill time to the toes in the digits are well perfused    Assessment:     Ingrown toenail deformity left hallux medial border    Plan:     Reviewed condition and discussed treatment and patient wants it fixed understanding risk. Infiltrated the left hallux 60 mg Xylocaine Marcaine mixture remove corner and exposed matrix applied phenol 3 applications 30 seconds followed by alcohol lavaged and sterile dressing. Given instructions on soaks and reappoint

## 2014-03-06 ENCOUNTER — Other Ambulatory Visit: Payer: Self-pay | Admitting: Cardiovascular Disease

## 2014-03-15 ENCOUNTER — Encounter: Payer: Self-pay | Admitting: Podiatry

## 2014-03-15 ENCOUNTER — Ambulatory Visit (INDEPENDENT_AMBULATORY_CARE_PROVIDER_SITE_OTHER): Payer: 59 | Admitting: Podiatry

## 2014-03-15 VITALS — BP 156/85 | HR 67 | Resp 16

## 2014-03-15 DIAGNOSIS — L03039 Cellulitis of unspecified toe: Secondary | ICD-10-CM

## 2014-03-15 NOTE — Progress Notes (Signed)
Subjective:     Patient ID: Danielle Yates, female   DOB: 1941-12-02, 73 y.o.   MRN: [id number]  HPI patient states the nail is irritated in remaining nail that is left after removal of the corner   Review of Systems     Objective:   Physical Exam Neurovascular status intact with no health history changes noted and incurvated left hallux nail that is draining of the localized nature with no proximal edema erythema noted    Assessment:     Probable irritation to the remaining nail bed with healing area and possible localized mild paronychia infection    Plan:     Debrided tissue and instructed on soaks and applied sterile dressing. Soak for the next few days and if any issues were to occur let us nknow

## 2014-04-25 ENCOUNTER — Other Ambulatory Visit: Payer: Self-pay | Admitting: *Deleted

## 2014-04-25 MED ORDER — FLUOXETINE HCL 20 MG PO CAPS: 20.0000 mg | ORAL_CAPSULE | Freq: Every day | ORAL | Status: DC

## 2014-04-25 MED ORDER — TRIAMTERENE-HCTZ 37.5-25 MG PO TABS: 1.0000 | ORAL_TABLET | Freq: Every day | ORAL | Status: DC

## 2014-06-03 ENCOUNTER — Encounter: Payer: Self-pay | Admitting: Internal Medicine

## 2014-06-03 ENCOUNTER — Ambulatory Visit (INDEPENDENT_AMBULATORY_CARE_PROVIDER_SITE_OTHER): Payer: 59 | Admitting: Internal Medicine

## 2014-06-03 VITALS — BP 142/78 | HR 64 | Temp 98.3°F | Ht 62.2 in | Wt 244.0 lb

## 2014-06-03 DIAGNOSIS — R7309 Other abnormal glucose: Secondary | ICD-10-CM

## 2014-06-03 DIAGNOSIS — R739 Hyperglycemia, unspecified: Secondary | ICD-10-CM

## 2014-06-03 DIAGNOSIS — I10 Essential (primary) hypertension: Secondary | ICD-10-CM

## 2014-06-03 DIAGNOSIS — F419 Anxiety disorder, unspecified: Secondary | ICD-10-CM

## 2014-06-03 DIAGNOSIS — E7800 Pure hypercholesterolemia, unspecified: Secondary | ICD-10-CM

## 2014-06-03 DIAGNOSIS — Z720 Tobacco use: Secondary | ICD-10-CM

## 2014-06-03 DIAGNOSIS — F411 Generalized anxiety disorder: Secondary | ICD-10-CM

## 2014-06-03 DIAGNOSIS — F17200 Nicotine dependence, unspecified, uncomplicated: Secondary | ICD-10-CM

## 2014-06-03 DIAGNOSIS — Z23 Encounter for immunization: Secondary | ICD-10-CM

## 2014-06-03 NOTE — Progress Notes (Signed)
Pre visit review using our clinic review tool, if applicable. No additional management support is needed unless otherwise documented below in the visit note. 

## 2014-06-03 NOTE — Progress Notes (Signed)
Subjective:    Patient ID: Danielle Yates, female    DOB: 1941-07-13, 73 y.o.   MRN: [id number]  HPI 73 year old female with past history of hypertension, hypercholesterolemia and hyperglycemia who comes in today for a scheduled follow up.   She has been seeing Dr Fletcher Anon.  Had been having problems with elevated blood pressure.  Dr Fletcher Anon added diltiazem.  She did not tolerate Carvedilol, amlodipine and metoprolol.  Doing well with this regimen.  States blood pressure controlled.  No increased sob.  No chest pain or tightness.  She is still smoking.  Was using E cigarettes to help her cut back.  Had previously discussed prescription medication.  Discussed again today.  Desires not to take any other prescription medications. She was involved in a BREATHE program through her work.  Has cut down.  Some cough and congestion.  Overall she feels her breathing is stable.  No fever.  No nausea or vomiting.        Past Medical History  Diagnosis Date  . Arthropathy, unspecified, site unspecified   . Essential hypertension, benign   . External hemorrhoids without mention of complication   . Tobacco use disorder   . Diverticulosis of colon (without mention of hemorrhage)   . Panic disorder without agoraphobia   . Disorder of bone and cartilage, unspecified   . Chronic rhinitis   . Unspecified vitamin D deficiency   . Other abnormal glucose   . Obesity, unspecified   . Pure hypercholesterolemia   . Allergy   . Cataract   . 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 aspirin EC 81 MG tablet Take 81 mg by mouth daily.  . Calcium Carbonate-Vitamin D (CALCIUM 600+D) 600-200 MG-UNIT TABS Take 1 tablet by mouth daily.  [name] diltiazem (CARDIZEM CD) 120 MG 24 hr capsule Take 1 capsule by mouth  daily  . enalapril (VASOTEC) 20 MG tablet Take 1 tablet by mouth  every morning and 2 tablets by mouth every evening  .  FLUoxetine (PROZAC) 20 MG capsule Take 1 capsule (20 mg total) by mouth daily.  [name] triamterene-hydrochlorothiazide (MAXZIDE-25) 37.5-25 MG per tablet Take 1 tablet by mouth daily.  . [DISCONTINUED] amoxicillin (AMOXIL) 875 MG tablet Take 1 tablet (875 mg total) by mouth 2 (two) times daily.    Review of Systems Patient denies any headache, lightheadedness or dizziness.  No chest pain, tightness or palpitations.  No increased shortness of breath.  Some cough and congestion.  Overall stable.  No nausea or vomiting.  No abdominal pain or cramping.  No bowel change, such as diarrhea, constipation, BRBPR or melana.  No urine change.   Handling stress well. Is on Prozac for anxiety and panic attacks.   No recent panic attacks.  Has been very stable on Prozac.         Objective:   Physical Exam  Filed Vitals:   06/03/14 1533  BP: 142/78  Pulse: 64  Temp: 98.3 F (36.8 C)   Blood pressure recheck:  67/58  73 year old female in no acute distress.   HEENT:  Nares- clear.  Oropharynx - without lesions. NECK:  Supple.  Nontender.  No audible bruit.  HEART:  Appears to be regular. LUNGS:  No crackles or wheezing audible.  Respirations even and unlabored.  RADIAL PULSE:  Equal bilaterally.    ABDOMEN:  Soft, nontender.  Bowel sounds present and normal.  No audible abdominal bruit.   EXTREMITIES:  No increased edema present.  DP pulses palpable and equal bilaterally.          Assessment & Plan:  DESIRE FOR WEIGHT LOSS.  Has lost weight.  Follow.    HEALTH MAINTENANCE.  Physical last visit.   Mammogram 05/07/13 - Birads I.   Schedule f/u mammogram.   Colonoscopy 2012 - no polyps.  States due follow up 10 years.   INCREASED PSYCHOSOCIAL STRESSORS.  Feels she is doing well.  Follow.  On Prozac.

## 2014-06-07 ENCOUNTER — Encounter: Payer: Self-pay | Admitting: Internal Medicine

## 2014-06-07 NOTE — Assessment & Plan Note (Signed)
Discussed with her previously regarding the need to quit and methods/medications to help her quit.  Discussed again today.  She desires to continue to cut down on her own.  Follow.

## 2014-06-07 NOTE — Assessment & Plan Note (Signed)
Continue diet adjustment and weight loss.  Follow.  Low carb diet.

## 2014-06-07 NOTE — Assessment & Plan Note (Signed)
Low cholesterol diet and exercise.  Follow lipid panel and liver function.   

## 2014-06-07 NOTE — Assessment & Plan Note (Signed)
Blood pressure as outlined.  Overall under reasonable control.  Follow.

## 2014-06-07 NOTE — Assessment & Plan Note (Signed)
Has a history of anxiety and panic attacks.  Controlled on Prozac.  Follow.

## 2014-06-11 ENCOUNTER — Telehealth: Payer: Self-pay | Admitting: Internal Medicine

## 2014-06-11 DIAGNOSIS — Z1239 Encounter for other screening for malignant neoplasm of breast: Secondary | ICD-10-CM

## 2014-06-11 NOTE — Telephone Encounter (Signed)
Pt calling to have mammogram scheduled at [place]. States she has been waiting for this. No order in.  Prefers Mondays.   If scheduled 7/13, the pt could not go until after 11:00 a.m.  Pt would like appt details sent to mychart as well as a phone call to home#.

## 2014-06-12 NOTE — Telephone Encounter (Signed)
Pt called about mammogram.  I placed the order.  See her phone message.   Thanks.  Dr Nicki Reaper

## 2014-06-19 ENCOUNTER — Encounter: Payer: Self-pay | Admitting: Internal Medicine

## 2014-06-24 ENCOUNTER — Encounter: Payer: Self-pay | Admitting: *Deleted

## 2014-06-24 LAB — HM MAMMOGRAPHY: HM Mammogram: NEGATIVE

## 2014-07-04 ENCOUNTER — Other Ambulatory Visit: Payer: Self-pay | Admitting: Cardiovascular Disease

## 2014-09-24 ENCOUNTER — Encounter: Payer: Self-pay | Admitting: Internal Medicine

## 2014-09-24 LAB — LIPID PANEL
Cholesterol: 208 mg/dL — AB (ref 0–200)
HDL: 61 mg/dL (ref 35–70)
LDL CALC: 127 mg/dL
Triglycerides: 99 mg/dL (ref 40–160)

## 2014-09-24 LAB — BASIC METABOLIC PANEL
BUN: 13 mg/dL (ref 4–21)
Creatinine: 0.8 mg/dL (ref 0.5–1.1)
Glucose: 112 mg/dL
Potassium: 4.1 mmol/L (ref 3.4–5.3)
Sodium: 140 mmol/L (ref 137–147)

## 2014-09-30 ENCOUNTER — Encounter: Payer: Self-pay | Admitting: Internal Medicine

## 2014-09-30 ENCOUNTER — Ambulatory Visit (INDEPENDENT_AMBULATORY_CARE_PROVIDER_SITE_OTHER): Payer: Commercial Managed Care - HMO | Admitting: Internal Medicine

## 2014-09-30 VITALS — BP 130/80 | HR 64 | Temp 98.2°F | Ht 62.2 in | Wt 243.5 lb

## 2014-09-30 DIAGNOSIS — F419 Anxiety disorder, unspecified: Secondary | ICD-10-CM

## 2014-09-30 DIAGNOSIS — I10 Essential (primary) hypertension: Secondary | ICD-10-CM

## 2014-09-30 DIAGNOSIS — E7800 Pure hypercholesterolemia, unspecified: Secondary | ICD-10-CM

## 2014-09-30 DIAGNOSIS — Z720 Tobacco use: Secondary | ICD-10-CM

## 2014-09-30 DIAGNOSIS — R739 Hyperglycemia, unspecified: Secondary | ICD-10-CM

## 2014-09-30 NOTE — Progress Notes (Signed)
Pre visit review using our clinic review tool, if applicable. No additional management support is needed unless otherwise documented below in the visit note. 

## 2014-10-06 ENCOUNTER — Encounter: Payer: Self-pay | Admitting: Internal Medicine

## 2014-10-06 DIAGNOSIS — Z6841 Body Mass Index (BMI) 40.0 and over, adult: Secondary | ICD-10-CM | POA: Insufficient documentation

## 2014-10-06 NOTE — Progress Notes (Signed)
Subjective:    Patient ID: Danielle Yates, female    DOB: Nov 07, 1941, 73 y.o.   MRN: [id number]  HPI 73 year old female with past history of hypertension, hypercholesterolemia and hyperglycemia who comes in today for a scheduled follow up.   She has been seeing Dr Fletcher Anon.  Had been having problems with elevated blood pressure.  Dr Fletcher Anon added diltiazem.  She did not tolerate Carvedilol, amlodipine and metoprolol.  Doing well with this regimen.  States blood pressure controlled.  No increased sob.  No chest pain or tightness.  She is still smoking.   Discussed with her again today.  Desires not to take any other prescription medications. Overall she feels her breathing is stable.  No fever.  No nausea or vomiting.  Acid reflux better.        Past Medical History  Diagnosis Date  . Arthropathy, unspecified, site unspecified   . Essential hypertension, benign   . External hemorrhoids without mention of complication   . Tobacco use disorder   . Diverticulosis of colon (without mention of hemorrhage)   . Panic disorder without agoraphobia   . Disorder of bone and cartilage, unspecified   . Chronic rhinitis   . Unspecified vitamin D deficiency   . Other abnormal glucose   . Obesity, unspecified   . Pure hypercholesterolemia   . Allergy   . Cataract   . 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 aspirin EC 81 MG tablet Take 81 mg by mouth daily.  [name] diltiazem (CARDIZEM CD) 120 MG 24 hr capsule Take 1 capsule by mouth  daily  . enalapril (VASOTEC) 20 MG tablet Take 1 tablet by mouth  every morning and 2 tablets by mouth every evening  . FLUoxetine (PROZAC) 20 MG capsule Take 1 capsule (20 mg total) by mouth daily.  [name] OVER THE COUNTER MEDICATION Bayer Pro-take one daily  . triamterene-hydrochlorothiazide (MAXZIDE-25) 37.5-25 MG per tablet Take 1 tablet by mouth daily.  [name] VITAMIN D, CHOLECALCIFEROL, PO  Take by mouth daily.  . [DISCONTINUED] Calcium Carbonate-Vitamin D (CALCIUM 600+D) 600-200 MG-UNIT TABS Take 1 tablet by mouth daily.    Review of Systems Patient denies any headache, lightheadedness or dizziness.  No chest pain, tightness or palpitations.  No increased shortness of breath.  No increased cough or congestion.  Breathing stable.  No nausea or vomiting.  No abdominal pain or cramping.  No bowel change, such as diarrhea, constipation, BRBPR or melana.  No urine change.   Handling stress well. Is on Prozac for anxiety and panic attacks.   No recent panic attacks.  Has been very stable on Prozac.  Overall she feels she is doing well.       Objective:   Physical Exam  Filed Vitals:   09/30/14 1503  BP: 130/80  Pulse: 64  Temp: 98.2 F (36.8 C)   Blood pressure recheck:  68/23  73 year old female in no acute distress.   HEENT:  Nares- clear.  Oropharynx - without lesions. NECK:  Supple.  Nontender.  No audible bruit.  HEART:  Appears to be regular. LUNGS:  No crackles or wheezing audible.  Respirations even and unlabored.  RADIAL PULSE:  Equal bilaterally.    ABDOMEN:  Soft, nontender.  Bowel sounds present and normal.  No audible abdominal bruit.   EXTREMITIES:  No increased edema present.  DP pulses  palpable and equal bilaterally.          Assessment & Plan:  DESIRE FOR WEIGHT LOSS.  Has lost weight (overall).  Weight stable from last check.  Follow.    Essential hypertension, benign Blood pressure doing well.  Follow.   Pure hypercholesterolemia Low cholesterol diet and exercise.  Follow lipid panel.    Lab Results  Component Value Date   CHOL 208* 09/24/2014   HDL 61 09/24/2014   LDLCALC 127 09/24/2014   TRIG 99 09/24/2014   Hyperglycemia Low carb diet and exercise.  Weight loss.   Lab Results  Component Value Date   HGBA1C 5.6 05/23/2013   Anxiety History of ncreased stress and history anxiety. Doing well on prozac.    Tobacco abuse Have discussed  smoking cessation and treatment options.  She is not ready to quit.  Follow.   Obesity.  She has adjusted her diet.  Exercise.  Weight loss.    HEALTH MAINTENANCE.  Needs a physical.  Mammogram 06/24/14 - Birads I.   Colonoscopy 2012 - no polyps.  States due follow up 10 years.

## 2014-11-19 ENCOUNTER — Encounter: Payer: Self-pay | Admitting: Internal Medicine

## 2015-01-14 ENCOUNTER — Other Ambulatory Visit: Payer: Self-pay | Admitting: *Deleted

## 2015-01-14 MED ORDER — ENALAPRIL MALEATE 20 MG PO TABS: ORAL_TABLET | ORAL | Status: DC

## 2015-01-14 MED ORDER — TRIAMTERENE-HCTZ 37.5-25 MG PO TABS: 1.0000 | ORAL_TABLET | Freq: Every day | ORAL | Status: DC

## 2015-01-14 MED ORDER — DILTIAZEM HCL ER COATED BEADS 120 MG PO CP24: ORAL_CAPSULE | ORAL | Status: DC

## 2015-01-14 MED ORDER — FLUOXETINE HCL 20 MG PO CAPS: 20.0000 mg | ORAL_CAPSULE | Freq: Every day | ORAL | Status: DC

## 2015-01-21 ENCOUNTER — Encounter: Payer: Self-pay | Admitting: *Deleted

## 2015-01-22 ENCOUNTER — Other Ambulatory Visit: Payer: Self-pay | Admitting: *Deleted

## 2015-01-22 MED ORDER — ENALAPRIL MALEATE 20 MG PO TABS: ORAL_TABLET | ORAL | Status: DC

## 2015-01-28 ENCOUNTER — Other Ambulatory Visit: Payer: Self-pay | Admitting: *Deleted

## 2015-01-28 MED ORDER — ENALAPRIL MALEATE 20 MG PO TABS: ORAL_TABLET | ORAL | Status: DC

## 2015-01-30 LAB — HEPATIC FUNCTION PANEL
ALK PHOS: 59 U/L (ref 25–125)
ALT: 11 U/L (ref 7–35)
AST: 15 U/L (ref 13–35)
BILIRUBIN, TOTAL: 0.6 mg/dL

## 2015-01-30 LAB — TSH: TSH: 3.36 u[IU]/mL (ref 0.41–5.90)

## 2015-01-30 LAB — CBC AND DIFFERENTIAL
HCT: 40 % (ref 36–46)
Hemoglobin: 13.8 g/dL (ref 12.0–16.0)
Neutrophils Absolute: 3 /uL
Platelets: 221 K/µL (ref 150–399)
WBC: 5.5 10*3/mL

## 2015-01-30 LAB — LIPID PANEL
CHOLESTEROL: 200 mg/dL (ref 0–200)
HDL: 61 mg/dL (ref 35–70)
LDL CALC: 119 mg/dL
Triglycerides: 99 mg/dL (ref 40–160)

## 2015-01-30 LAB — BASIC METABOLIC PANEL
BUN: 16 mg/dL (ref 4–21)
CREATININE: 0.7 mg/dL (ref 0.5–1.1)
GLUCOSE: 99 mg/dL
Potassium: 4.1 mmol/L (ref 3.4–5.3)
SODIUM: 140 mmol/L (ref 137–147)

## 2015-01-30 LAB — HEMOGLOBIN A1C: HEMOGLOBIN A1C: 5.6 % (ref 4.0–6.0)

## 2015-01-31 ENCOUNTER — Encounter: Payer: Self-pay | Admitting: Internal Medicine

## 2015-01-31 ENCOUNTER — Ambulatory Visit (INDEPENDENT_AMBULATORY_CARE_PROVIDER_SITE_OTHER): Payer: Medicare PPO | Admitting: Internal Medicine

## 2015-01-31 VITALS — BP 130/70 | HR 88 | Temp 98.5°F | Ht 62.2 in | Wt 242.5 lb

## 2015-01-31 DIAGNOSIS — R739 Hyperglycemia, unspecified: Secondary | ICD-10-CM | POA: Diagnosis not present

## 2015-01-31 DIAGNOSIS — R0989 Other specified symptoms and signs involving the circulatory and respiratory systems: Secondary | ICD-10-CM

## 2015-01-31 DIAGNOSIS — F419 Anxiety disorder, unspecified: Secondary | ICD-10-CM

## 2015-01-31 DIAGNOSIS — I10 Essential (primary) hypertension: Secondary | ICD-10-CM

## 2015-01-31 DIAGNOSIS — E7800 Pure hypercholesterolemia, unspecified: Secondary | ICD-10-CM

## 2015-01-31 DIAGNOSIS — Z0000 Encounter for general adult medical examination without abnormal findings: Secondary | ICD-10-CM

## 2015-01-31 DIAGNOSIS — Z720 Tobacco use: Secondary | ICD-10-CM

## 2015-01-31 NOTE — Progress Notes (Signed)
Pre visit review using our clinic review tool, if applicable. No additional management support is needed unless otherwise documented below in the visit note. 

## 2015-02-02 ENCOUNTER — Encounter: Payer: Self-pay | Admitting: Internal Medicine

## 2015-02-02 DIAGNOSIS — R0989 Other specified symptoms and signs involving the circulatory and respiratory systems: Secondary | ICD-10-CM | POA: Insufficient documentation

## 2015-02-02 DIAGNOSIS — Z0000 Encounter for general adult medical examination without abnormal findings: Secondary | ICD-10-CM | POA: Insufficient documentation

## 2015-02-02 NOTE — Assessment & Plan Note (Signed)
Schedule for a physical.  Mammogram 06/24/14 - Birads I.  Colonoscopy 2012.  Recommended f/u colonoscopy in 10 years.

## 2015-02-02 NOTE — Assessment & Plan Note (Signed)
On prozac.  Feels she is handling things relatively well.  Follow.

## 2015-02-02 NOTE — Assessment & Plan Note (Signed)
Blood pressure has been under good control.  Blood pressure recheck:  134/72.  We dicussed enalapril 20mg  dosing.  She will remain on her same dose.  Follow pressures.  Check metabolic panel.

## 2015-02-02 NOTE — Assessment & Plan Note (Signed)
Low cholesterol diet and exercise.  Has joined [employer].  LDL just checked 119.  Improved.  Follow.  Intolerance to lipitor and crestor.

## 2015-02-02 NOTE — Assessment & Plan Note (Signed)
Schedule aortic ultrasound.

## 2015-02-02 NOTE — Assessment & Plan Note (Signed)
Have discussed the need to stop smoking.   

## 2015-02-02 NOTE — Progress Notes (Signed)
Patient ID: Danielle Yates, female   DOB: [birth year], 74 y.o.   MRN: [id number]   Subjective:    Patient ID: Danielle Yates, female    DOB: 30-Nov-1941, 74 y.o.   MRN: [id number]  HPI  Patient here for a scheduled follow up.  States she feels she is handling stress relatively well.  On prozac.  Discussed weight loss. Has joined [employer].  Plans to get more serious about her diet and exercise.  Blood pressure has been doing well.  Breathing stable.  No increased cough or congestion.  Bowels stable.    Past Medical History  Diagnosis Date  . Arthropathy, unspecified, site unspecified   . Essential hypertension, benign   . External hemorrhoids without mention of complication   . Tobacco use disorder   . Diverticulosis of colon (without mention of hemorrhage)   . Panic disorder without agoraphobia   . Disorder of bone and cartilage, unspecified   . Chronic rhinitis   . Unspecified vitamin D deficiency   . Other abnormal glucose   . Obesity, unspecified   . Pure hypercholesterolemia   . Allergy   . Cataract   . 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 aspirin EC 81 MG tablet Take 81 mg by mouth daily.    [name] diltiazem (CARDIZEM CD) 120 MG 24 hr capsule Take 1 capsule by mouth  daily 90 capsule 2  . enalapril (VASOTEC) 20 MG tablet Take 1 tablet by mouth  every morning and 1 tablet by mouth every evening (Patient taking differently: Take 1 tablet by mouth  every morning and 2 tablet by mouth every evening) 180 tablet 1  . FLUoxetine (PROZAC) 20 MG capsule Take 1 capsule (20 mg total) by mouth daily. 90 capsule 1  . OVER THE COUNTER MEDICATION Bayer Pro-take one daily    . triamterene-hydrochlorothiazide (MAXZIDE-25) 37.5-25 MG per tablet Take 1 tablet by mouth daily. 90 tablet 1  . VITAMIN D, CHOLECALCIFEROL, PO Take by mouth daily.     No current  facility-administered medications on file prior to visit.    Review of Systems  Constitutional: Negative for appetite change and unexpected weight change.  HENT: Negative for congestion and sinus pressure.   Respiratory: Negative for cough, chest tightness and shortness of breath.   Cardiovascular: Negative for chest pain, palpitations and leg swelling.  Gastrointestinal: Negative for nausea, vomiting and abdominal pain.  Genitourinary: Negative for dysuria and difficulty urinating.  Neurological: Negative for dizziness, light-headedness and headaches.  Psychiatric/Behavioral:       Feels she is handling stress well.         Objective:     Blood pressure recheck:  134/72  Physical Exam  Constitutional: She appears well-developed and well-nourished. No distress.  HENT:  Nose: Nose normal.  Mouth/Throat: Oropharynx is clear and moist.  Neck: Neck supple. No thyromegaly present.  Cardiovascular: Normal rate and regular rhythm.   Pulmonary/Chest: Breath sounds normal. No respiratory distress. She has no wheezes.  Abdominal: Soft. Bowel sounds are normal. There is no tenderness.  Abdominal bruit present.   Musculoskeletal: She exhibits no edema or tenderness.  Lymphadenopathy:    She has no cervical adenopathy.  Skin: No rash noted. No erythema.    BP 130/70 mmHg  Pulse 88  Temp(Src) 98.5 F (36.9 C) (Oral)  Ht 5' 2.2" (1.58 m)  Wt  242 lb 8 oz (109.997 kg)  BMI 44.06 kg/m2  SpO2 97% Wt Readings from Last 3 Encounters:  01/31/15 242 lb 8 oz (109.997 kg)  09/30/14 243 lb 8 oz (110.451 kg)  06/03/14 244 lb (110.678 kg)     Lab Results  Component Value Date   WBC 5.5 01/30/2015   HGB 13.8 01/30/2015   HCT 40 01/30/2015   PLT 221 01/30/2015   GLUCOSE 99 05/23/2013   CHOL 200 01/30/2015   TRIG 99 01/30/2015   HDL 61 01/30/2015   LDLCALC 119 01/30/2015   ALT 11 01/30/2015   AST 15 01/30/2015   NA 140 01/30/2015   K 4.1 01/30/2015   CL 99 05/23/2013   CREATININE  0.7 01/30/2015   BUN 16 01/30/2015   CO2 26 05/23/2013   TSH 3.36 01/30/2015   HGBA1C 5.6 01/30/2015       Assessment & Plan:   Problem List Items Addressed This Visit    Abdominal bruit    Schedule aortic ultrasound.        Relevant Orders   Ambulatory referral to Vascular Surgery   Anxiety    On prozac.  Feels she is handling things relatively well.  Follow.        Essential hypertension, benign - Primary    Blood pressure has been under good control.  Blood pressure recheck:  134/72.  We dicussed enalapril 27m dosing.  She will remain on her same dose.  Follow pressures.  Check metabolic panel.        Health care maintenance    Schedule for a physical.  Mammogram 06/24/14 - Birads I.  Colonoscopy 2012.  Recommended f/u colonoscopy in 10 years.        Hyperglycemia    Low carb diet and exercise.  Follow met b and a1c.       Pure hypercholesterolemia    Low cholesterol diet and exercise.  Has joined [employer]  LDL just checked 119.  Improved.  Follow.  Intolerance to lipitor and crestor.        Severe obesity (BMI >= 40)    She has joined [employer]  Discussed diet adjustment and exercise.  Follow.        Tobacco abuse    Have discussed the need to stop smoking.          I spent 25 minutes with the patient and more than 50% of the time was spent in consultation regarding the above.     SEinar Pheasant MD

## 2015-02-02 NOTE — Assessment & Plan Note (Signed)
Low carb diet and exercise.  Follow met b and a1c.  

## 2015-02-02 NOTE — Assessment & Plan Note (Signed)
She has joined [employer].  Discussed diet adjustment and exercise.  Follow.

## 2015-02-27 ENCOUNTER — Encounter: Payer: Self-pay | Admitting: Internal Medicine

## 2015-03-01 ENCOUNTER — Other Ambulatory Visit: Payer: Self-pay | Admitting: Internal Medicine

## 2015-03-01 DIAGNOSIS — I771 Stricture of artery: Secondary | ICD-10-CM

## 2015-03-01 NOTE — Progress Notes (Signed)
Order placed for referral to vascular surgery.  

## 2015-03-10 ENCOUNTER — Encounter: Payer: Self-pay | Admitting: Internal Medicine

## 2015-03-10 DIAGNOSIS — I771 Stricture of artery: Secondary | ICD-10-CM

## 2015-03-11 NOTE — Telephone Encounter (Signed)
Order placed for referral to Dr Fletcher Anon for bilateral iliac artery stenosis.

## 2015-03-12 ENCOUNTER — Telehealth: Payer: Self-pay | Admitting: Internal Medicine

## 2015-03-12 DIAGNOSIS — E7800 Pure hypercholesterolemia, unspecified: Secondary | ICD-10-CM

## 2015-03-12 MED ORDER — PRAVASTATIN SODIUM 10 MG PO TABS: 10.0000 mg | ORAL_TABLET | Freq: Every day | ORAL | Status: DC

## 2015-03-12 NOTE — Telephone Encounter (Signed)
Pt will have non fasting labs done at [place]. Pt will stop by the office to pick up order form.msn

## 2015-03-12 NOTE — Telephone Encounter (Signed)
rx sent in for pravastatin 10mg  #30 with 2 refills.

## 2015-03-12 NOTE — Telephone Encounter (Signed)
Pt was notified of labs via my chart.  Needs a f/u non fasting lab appt in 6 weeks.  Please notify the patient of the appointment date and time.  Thanks.

## 2015-03-12 NOTE — Telephone Encounter (Signed)
Form completed & placed up front for pick up

## 2015-03-12 NOTE — Addendum Note (Signed)
Addended by: Alisa Graff on: 03/12/2015 12:55 AM   Modules accepted: Orders

## 2015-03-18 ENCOUNTER — Encounter: Payer: Self-pay | Admitting: Internal Medicine

## 2015-04-07 ENCOUNTER — Ambulatory Visit: Payer: Medicare PPO | Admitting: Cardiovascular Disease

## 2015-04-08 ENCOUNTER — Ambulatory Visit: Payer: Commercial Managed Care - HMO | Admitting: Cardiovascular Disease

## 2015-04-23 ENCOUNTER — Other Ambulatory Visit: Payer: Commercial Managed Care - HMO

## 2015-05-01 ENCOUNTER — Ambulatory Visit (INDEPENDENT_AMBULATORY_CARE_PROVIDER_SITE_OTHER): Payer: Medicare PPO | Admitting: Cardiovascular Disease

## 2015-05-01 ENCOUNTER — Encounter: Payer: Self-pay | Admitting: Cardiovascular Disease

## 2015-05-01 VITALS — BP 130/80 | HR 81 | Ht 63.0 in | Wt 243.5 lb

## 2015-05-01 DIAGNOSIS — I739 Peripheral vascular disease, unspecified: Secondary | ICD-10-CM | POA: Diagnosis not present

## 2015-05-01 DIAGNOSIS — Z720 Tobacco use: Secondary | ICD-10-CM

## 2015-05-01 DIAGNOSIS — E7800 Pure hypercholesterolemia, unspecified: Secondary | ICD-10-CM

## 2015-05-01 DIAGNOSIS — I10 Essential (primary) hypertension: Secondary | ICD-10-CM | POA: Diagnosis not present

## 2015-05-01 NOTE — Patient Instructions (Addendum)
Medication Instructions:  Your physician recommends that you continue on your current medications as directed. Please refer to the Current Medication list given to you today.    Labwork: None  Testing/Procedures: Your physician has requested that you have a lower  extremity arterial duplex. This test is an ultrasound of the arteries in the legs. It looks at arterial blood flow in the legs. Allow one hour for Lower Arterial scans. There are no restrictions or special instructions   Follow-Up: Your physician wants you to follow-up in:  One year with Dr. Fletcher Anon.  You will receive a reminder letter in the mail two months in advance. If you don't receive a letter, please call our office to schedule the follow-up appointment.   Any Other Special Instructions Will Be Listed Below (If Applicable).

## 2015-05-01 NOTE — Progress Notes (Signed)
HPI  This is a pleasant 74 year old Caucasian female who is here today for a followup visit regarding refractory  hypertension. The patient has no previous cardiac history.  She has family history of coronary artery disease. Her father died at the age of 46 from myocardial infarction. Her brother had myocardial infarction at the age of 29 and subsequently underwent CABG. She did not tolerate metoprolol due to bradycardia and fatigue. She did not tolerate amlodipine and carvedilol due to headache and dizziness.   She underwent renal duplex ultrasound in 2013 which showed no evidence of renal artery stenosis. Echocardiogram in 2013 showed normal LV systolic function. She was noted to have abdominal bruit. Abdominal aortic ultrasound showed no evidence of aortic aneurysm but there was evidence of significant stenosis affecting bilateral common iliac arteries. The patient has chronic back pain but denies any thigh or calf claudication. She is taking pravastatin for hyperlipidemia. She did not tolerate other statins in the past.     Allergies  Allergen Reactions  . Codeine Nausea Only  . Crestor [Rosuvastatin] Other (See Comments)    PER PT MADE ME FEEL" WEIRD"  . Nickel Rash     Current Outpatient Prescriptions on File Prior to Visit  Medication Sig Dispense Refill  . aspirin EC 81 MG tablet Take 81 mg by mouth daily.    [name] diltiazem (CARDIZEM CD) 120 MG 24 hr capsule Take 1 capsule by mouth  daily 90 capsule 2  . enalapril (VASOTEC) 20 MG tablet Take 1 tablet by mouth  every morning and 1 tablet by mouth every evening (Patient taking differently: Take 1 tablet by mouth  every morning and 2 tablet by mouth every evening) 180 tablet 1  . FLUoxetine (PROZAC) 20 MG capsule Take 1 capsule (20 mg total) by mouth daily. 90 capsule 1  . pravastatin (PRAVACHOL) 10 MG tablet Take 1 tablet (10 mg total) by mouth daily. 30 tablet 2  . triamterene-hydrochlorothiazide (MAXZIDE-25) 37.5-25 MG per tablet  Take 1 tablet by mouth daily. 90 tablet 1  . VITAMIN D, CHOLECALCIFEROL, PO Take by mouth daily.     No current facility-administered medications on file prior to visit.     Past Medical History  Diagnosis Date  . Arthropathy, unspecified, site unspecified   . Essential hypertension, benign   . External hemorrhoids without mention of complication   . Tobacco use disorder   . Diverticulosis of colon (without mention of hemorrhage)   . Panic disorder without agoraphobia   . Disorder of bone and cartilage, unspecified   . Chronic rhinitis   . Unspecified vitamin D deficiency   . Other abnormal glucose   . Obesity, unspecified   . Pure hypercholesterolemia   . Allergy   . Cataract   . 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 Tonsillectomy and adenoidectomy  1946  . Vaginal hysterectomy  1982    fibroid and endometriosis; one ovary remaining  . Cholecystectomy  1985    with open wound  . Appendectomy  1985  . Removed spot on face      [name]  . Eye surgery  2011    CATARACTS -  BOTH     Family History  Problem Relation Age of Onset  . Hypertension Sister   . Heart disease Brother   . Hypertension Brother   . Hyperlipidemia Brother   . Aneurysm Mother   .  Rheumatic fever Mother     as an adult  . Heart disease Father     MI x 2     History   Social History  . Marital Status: Widowed    Spouse Name: N/A  . Number of Children: 0  . Years of Education: college   Occupational History  . 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 Smoking status: Current Every Day Smoker -- 0.50 packs/day for 50 years    Types: Cigarettes  . Smokeless tobacco: Never Used  . Alcohol Use: No  . Drug Use: No  . Sexual Activity: Not on file   Other Topics Concern  . Not on file   Social History Narrative   Lives alone. Widowed after 23 years. Not dating. Has dog.  Exercise: Walking; light has not been walking as she used to before her 2 dogs passed away. Tries to walk some. Caffeine: Carbonated beverages, 3 servings/day;Diet coke. 3 a day.      10/02/12  PER PATIENT'S LAVENDER FORM: WALK 5 DAYS/WEEK FOR 30 MINUTES       PHYSICAL EXAM   BP 130/80 mmHg  Pulse 81  Ht 5\' 3"  (1.6 m)  Wt 243 lb 8 oz (110.451 kg)  BMI 43.14 kg/m2 Constitutional: She is oriented to person, place, and time. She appears well-developed and well-nourished. No distress.  HENT: No nasal discharge.  Head: Normocephalic and atraumatic.  Eyes: Pupils are equal and round. Right eye exhibits no discharge. Left eye exhibits no discharge.  Neck: Normal range of motion. Neck supple. No JVD present. No thyromegaly present.  Cardiovascular: Normal rate, regular rhythm, normal heart sounds. Exam reveals no gallop and no friction rub. There is 1/ 6 systolic ejection murmur at the aortic area Pulmonary/Chest: Effort normal and breath sounds normal. No stridor. No respiratory distress. She has no wheezes. She has no rales. She exhibits no tenderness.  Abdominal: Soft. Bowel sounds are normal. She exhibits no distension. There is no tenderness. There is no rebound and no guarding.  Musculoskeletal: Normal range of motion. She exhibits no edema and no tenderness.  Neurological: She is alert and oriented to person, place, and time. Coordination normal.  Skin: Skin is warm and dry. No rash noted. She is not diaphoretic. No erythema. No pallor.  Psychiatric: She has a normal mood and affect. Her behavior is normal. Judgment and thought content normal.  Dorsalis pedis is palpable bilaterally  EKG: Sinus  Rhythm  WITHIN NORMAL LIMITS  ASSESSMENT AND PLAN

## 2015-05-01 NOTE — Assessment & Plan Note (Signed)
Blood pressure is controlled on current medications. 

## 2015-05-01 NOTE — Assessment & Plan Note (Signed)
I discussed with her the importance of smoking cessation but she doesn't think she can quit at the present time.

## 2015-05-01 NOTE — Assessment & Plan Note (Addendum)
I agree with treatment with pravastatin especially with underlying PAD. We should target an LDL of less than 70 if possible.

## 2015-05-01 NOTE — Assessment & Plan Note (Signed)
The patient has evidence of bilateral common iliac artery stenosis by duplex. She does have chronic back pain but this does not seem to be atypical claudication. There is no thigh or calf claudication. I requested lower extremity arterial Doppler. I discussed with her the importance of controlling her risk factors and weight loss. I will follow this on a yearly basis.

## 2015-05-06 ENCOUNTER — Other Ambulatory Visit: Payer: Self-pay | Admitting: Internal Medicine

## 2015-05-07 ENCOUNTER — Other Ambulatory Visit: Payer: Self-pay | Admitting: *Deleted

## 2015-05-07 MED ORDER — PRAVASTATIN SODIUM 10 MG PO TABS: 10.0000 mg | ORAL_TABLET | Freq: Every day | ORAL | Status: DC

## 2015-05-08 ENCOUNTER — Encounter: Payer: Self-pay | Admitting: Internal Medicine

## 2015-05-08 LAB — HEPATIC FUNCTION PANEL
ALT: 11 U/L (ref 7–35)
AST: 17 U/L (ref 13–35)
Alkaline Phosphatase: 56 U/L (ref 25–125)
BILIRUBIN, TOTAL: 0.7 mg/dL
Bilirubin, Direct: 0.19 mg/dL (ref 0.01–0.4)

## 2015-05-12 ENCOUNTER — Ambulatory Visit (INDEPENDENT_AMBULATORY_CARE_PROVIDER_SITE_OTHER): Payer: Medicare PPO

## 2015-05-12 DIAGNOSIS — I739 Peripheral vascular disease, unspecified: Secondary | ICD-10-CM | POA: Diagnosis not present

## 2015-05-25 ENCOUNTER — Telehealth: Payer: Self-pay | Admitting: Internal Medicine

## 2015-05-25 NOTE — Telephone Encounter (Signed)
Pt notified of normal liver panel via my chart.

## 2015-06-05 ENCOUNTER — Encounter: Payer: Self-pay | Admitting: Internal Medicine

## 2015-06-05 LAB — LIPID PANEL
CHOLESTEROL: 168 mg/dL (ref 0–200)
HDL: 61 mg/dL (ref 35–70)
LDL Cholesterol: 89 mg/dL
Triglycerides: 91 mg/dL (ref 40–160)

## 2015-06-05 LAB — BASIC METABOLIC PANEL
BUN: 12 mg/dL (ref 4–21)
Creatinine: 0.7 mg/dL (ref 0.5–1.1)
GLUCOSE: 98 mg/dL
Potassium: 4.1 mmol/L (ref 3.4–5.3)
SODIUM: 142 mmol/L (ref 137–147)

## 2015-06-05 LAB — HEPATIC FUNCTION PANEL
ALT: 11 U/L (ref 7–35)
AST: 14 U/L (ref 13–35)
Alkaline Phosphatase: 58 U/L (ref 25–125)
Bilirubin, Direct: 0.17 mg/dL (ref 0.01–0.4)
Bilirubin, Total: 0.6 mg/dL

## 2015-06-05 LAB — HEMOGLOBIN A1C: Hgb A1c MFr Bld: 5.5 % (ref 4.0–6.0)

## 2015-06-06 ENCOUNTER — Ambulatory Visit (INDEPENDENT_AMBULATORY_CARE_PROVIDER_SITE_OTHER): Payer: Medicare PPO | Admitting: Internal Medicine

## 2015-06-06 ENCOUNTER — Encounter: Payer: Self-pay | Admitting: Internal Medicine

## 2015-06-06 VITALS — BP 146/82 | HR 78 | Temp 98.4°F | Ht 62.75 in | Wt 245.4 lb

## 2015-06-06 DIAGNOSIS — Z1239 Encounter for other screening for malignant neoplasm of breast: Secondary | ICD-10-CM | POA: Diagnosis not present

## 2015-06-06 DIAGNOSIS — I10 Essential (primary) hypertension: Secondary | ICD-10-CM

## 2015-06-06 DIAGNOSIS — E7800 Pure hypercholesterolemia, unspecified: Secondary | ICD-10-CM

## 2015-06-06 DIAGNOSIS — F419 Anxiety disorder, unspecified: Secondary | ICD-10-CM | POA: Diagnosis not present

## 2015-06-06 DIAGNOSIS — R739 Hyperglycemia, unspecified: Secondary | ICD-10-CM

## 2015-06-06 DIAGNOSIS — I739 Peripheral vascular disease, unspecified: Secondary | ICD-10-CM | POA: Diagnosis not present

## 2015-06-06 DIAGNOSIS — Z0000 Encounter for general adult medical examination without abnormal findings: Secondary | ICD-10-CM

## 2015-06-06 DIAGNOSIS — Z720 Tobacco use: Secondary | ICD-10-CM

## 2015-06-06 NOTE — Progress Notes (Signed)
Patient ID: LOVELLA Yates, female   DOB: [birth year], 74 y.o.   MRN: [id number]   Subjective:    Patient ID: Danielle Yates, female    DOB: [birth year], 74 y.o.   MRN: [id number]  HPI  Patient here to follow up on her current medical issues as well as for her physical exam.  Trying to stay active.  No cardiac symptoms with increased activity or exertion.  No sob.  Eating and drinking well.  Bowels stable.  Discussed smoking cessation.  She desires not to quit at this time.  Has cut down.     Past Medical History  Diagnosis Date  . Arthropathy, unspecified, site unspecified   . Essential hypertension, benign   . External hemorrhoids without mention of complication   . Tobacco use disorder   . Diverticulosis of colon (without mention of hemorrhage)   . Panic disorder without agoraphobia   . Disorder of bone and cartilage, unspecified   . Chronic rhinitis   . Unspecified vitamin D deficiency   . Other abnormal glucose   . Obesity, unspecified   . Pure hypercholesterolemia   . Allergy   . Cataract   . 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 aspirin EC 81 MG tablet Take 81 mg by mouth daily.    [name] diltiazem (CARDIZEM CD) 120 MG 24 hr capsule Take 1 capsule by mouth  daily 90 capsule 2  . enalapril (VASOTEC) 20 MG tablet TAKE 1 TABLET EVERY MORNING AND 2 TABLETS EVERY EVENING 270 tablet 1  . FLUoxetine (PROZAC) 20 MG capsule TAKE 1 CAPSULE EVERY DAY 90 capsule 0  . pravastatin (PRAVACHOL) 10 MG tablet Take 1 tablet (10 mg total) by mouth daily. 90 tablet 1  . triamterene-hydrochlorothiazide (MAXZIDE-25) 37.5-25 MG per tablet TAKE 1 TABLET EVERY DAY 90 tablet 1  . VITAMIN D, CHOLECALCIFEROL, PO Take by mouth daily.     No current facility-administered medications on file prior to visit.    Review of Systems  Constitutional: Negative for appetite change and unexpected  weight change.  HENT: Negative for congestion and sinus pressure.   Eyes: Negative for pain and visual disturbance.  Respiratory: Negative for cough, chest tightness and shortness of breath.   Cardiovascular: Negative for chest pain and palpitations.  Gastrointestinal: Negative for nausea, vomiting, abdominal pain and diarrhea.  Genitourinary: Negative for dysuria and difficulty urinating.  Musculoskeletal: Negative for joint swelling.  Skin: Negative for color change and rash.  Neurological: Negative for dizziness, light-headedness and headaches.  Hematological: Negative for adenopathy. Does not bruise/bleed easily.  Psychiatric/Behavioral: Negative for dysphoric mood and agitation.       Objective:     Blood pressure recheck:  138-140/72  Physical Exam  Constitutional: She is oriented to person, place, and time. She appears well-developed and well-nourished.  HENT:  Nose: Nose normal.  Mouth/Throat: Oropharynx is clear and moist.  Eyes: Right eye exhibits no discharge. Left eye exhibits no discharge. No scleral icterus.  Neck: Neck supple. No thyromegaly present.  Cardiovascular: Normal rate and regular rhythm.   Pulmonary/Chest: Breath sounds normal. No accessory muscle usage. No tachypnea. No respiratory distress. She has no decreased breath sounds. She has no wheezes. She has no rhonchi. Right breast exhibits no inverted nipple, no mass, no nipple discharge and no tenderness (no axillary adenopathy). Left breast exhibits no inverted nipple, no  mass, no nipple discharge and no tenderness (no axilarry adenopathy).  Abdominal: Soft. Bowel sounds are normal. There is no tenderness.  Musculoskeletal: She exhibits no edema or tenderness.  Lymphadenopathy:    She has no cervical adenopathy.  Neurological: She is alert and oriented to person, place, and time.  Skin: Skin is warm. No rash noted.  Psychiatric: She has a normal mood and affect. Her behavior is normal.    BP 146/82 mmHg   Pulse 78  Temp(Src) 98.4 F (36.9 C) (Oral)  Ht 5' 2.75" (1.594 m)  Wt 245 lb 6 oz (111.301 kg)  BMI 43.80 kg/m2  SpO2 97% Wt Readings from Last 3 Encounters:  06/06/15 245 lb 6 oz (111.301 kg)  05/01/15 243 lb 8 oz (110.451 kg)  01/31/15 242 lb 8 oz (109.997 kg)     Lab Results  Component Value Date   WBC 5.5 01/30/2015   HGB 13.8 01/30/2015   HCT 40 01/30/2015   PLT 221 01/30/2015   GLUCOSE 99 05/23/2013   CHOL 168 06/05/2015   TRIG 91 06/05/2015   HDL 61 06/05/2015   LDLCALC 89 06/05/2015   ALT 11 06/05/2015   AST 14 06/05/2015   NA 142 06/05/2015   K 4.1 06/05/2015   CL 99 05/23/2013   CREATININE 0.7 06/05/2015   BUN 12 06/05/2015   CO2 26 05/23/2013   TSH 3.36 01/30/2015   HGBA1C 5.5 06/05/2015       Assessment & Plan:   Problem List Items Addressed This Visit    Anxiety    On prozac.  Stable.        Essential hypertension, benign    Blood pressure as outlined.  Same medication regimen.  Follow pressures.  Follow metabolic panel.        Health care maintenance    Physical today.  Mammogram 06/24/14 - Birads I.  Colonoscopy 2012.  Recommended f/u colonoscopy in 10 years.        Hyperglycemia    Low carb diet and exercise.  Follow met b and a1c.   Lab Results  Component Value Date   HGBA1C 5.5 06/05/2015        PAD (peripheral artery disease)    Has evidence of bilateral common iliac artery stenosis by duplex.  Being followed yearly.        Pure hypercholesterolemia    Low cholesterol diet and exercise.  She is tolerating the pravastatin.  LDL 89 on recent check.        Severe obesity (BMI >= 40)    Diet and exercise.        Tobacco abuse    Discussed the need to quit smoking.  She desires not to quit at this time.  Has cut down.         Other Visit Diagnoses    Breast cancer screening    -  Primary    Relevant Orders    MM DIGITAL SCREENING BILATERAL      I spent 25 minutes with the patient and more than 50% of the time was  spent in consultation regarding the above.     Einar Pheasant, MD

## 2015-06-06 NOTE — Progress Notes (Signed)
Pre visit review using our clinic review tool, if applicable. No additional management support is needed unless otherwise documented below in the visit note. 

## 2015-06-08 ENCOUNTER — Encounter: Payer: Self-pay | Admitting: Internal Medicine

## 2015-06-08 NOTE — Assessment & Plan Note (Signed)
Discussed the need to quit smoking.  She desires not to quit at this time.  Has cut down.

## 2015-06-08 NOTE — Assessment & Plan Note (Signed)
Diet and exercise.   

## 2015-06-08 NOTE — Assessment & Plan Note (Signed)
Low carb diet and exercise.  Follow met b and a1c.   Lab Results  Component Value Date   HGBA1C 5.5 06/05/2015

## 2015-06-08 NOTE — Assessment & Plan Note (Signed)
On prozac.  Stable.   

## 2015-06-08 NOTE — Assessment & Plan Note (Signed)
Physical today.  Mammogram 06/24/14 - Birads I.  Colonoscopy 2012.  Recommended f/u colonoscopy in 10 years.

## 2015-06-08 NOTE — Assessment & Plan Note (Signed)
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  Being followed yearly.

## 2015-06-08 NOTE — Assessment & Plan Note (Signed)
Low cholesterol diet and exercise.  She is tolerating the pravastatin.  LDL 89 on recent check.

## 2015-06-08 NOTE — Assessment & Plan Note (Signed)
Blood pressure as outlined.  Same medication regimen.  Follow pressures.  Follow metabolic panel.  

## 2015-06-24 ENCOUNTER — Encounter: Payer: Self-pay | Admitting: Internal Medicine

## 2015-06-30 LAB — HM MAMMOGRAPHY: HM MAMMO: NEGATIVE

## 2015-07-03 ENCOUNTER — Encounter: Payer: Self-pay | Admitting: Internal Medicine

## 2015-09-01 ENCOUNTER — Ambulatory Visit (INDEPENDENT_AMBULATORY_CARE_PROVIDER_SITE_OTHER): Payer: Medicare PPO | Admitting: Nurse Practitioner

## 2015-09-01 ENCOUNTER — Encounter: Payer: Self-pay | Admitting: Nurse Practitioner

## 2015-09-01 VITALS — BP 178/86 | HR 75 | Temp 98.7°F | Resp 14 | Ht 62.75 in | Wt 243.8 lb

## 2015-09-01 DIAGNOSIS — J069 Acute upper respiratory infection, unspecified: Secondary | ICD-10-CM

## 2015-09-01 MED ORDER — AZITHROMYCIN 250 MG PO TABS: ORAL_TABLET | ORAL | Status: DC

## 2015-09-01 NOTE — Progress Notes (Signed)
Patient ID: Danielle Yates, female    DOB: 10/03/1941  Age: 74 y.o. MRN: [id number]  CC: Sinusitis   HPI Danielle Yates presents for CC of sinusitis x 6 days.   1) Started last Wednesday, sore throat first symptom. Pt reports drainage- clear, ears painful, coughing- productive  Has sick contacts  Treatment to date- mucinex- plain  Still smoking   History Danielle Yates has a past medical history of Arthropathy, unspecified, site unspecified; Essential hypertension, benign; External hemorrhoids without mention of complication; Tobacco use disorder; Diverticulosis of colon (without mention of hemorrhage); Panic disorder without agoraphobia; Disorder of bone and cartilage, unspecified; Chronic rhinitis; Unspecified vitamin D deficiency; Other abnormal glucose; Obesity, unspecified; Pure hypercholesterolemia; Allergy; Cataract; and Heart murmur.   She has past surgical history that includes Tonsillectomy and adenoidectomy (1946); Vaginal hysterectomy (1982); Cholecystectomy (1985); Appendectomy (1985); Removed spot on face; and Eye surgery (2011).   Her family history includes Aneurysm in her mother; Heart disease in her brother and father; Hyperlipidemia in her brother; Hypertension in her brother and sister; Rheumatic fever in her mother.She reports that she has been smoking Cigarettes.  She has a 25 pack-year smoking history. She has never used smokeless tobacco. She reports that she does not drink alcohol or use illicit drugs.  Outpatient Prescriptions Prior to Visit  Medication Sig Dispense Refill  . aspirin EC 81 MG tablet Take 81 mg by mouth daily.    [name] diltiazem (CARDIZEM CD) 120 MG 24 hr capsule Take 1 capsule by mouth  daily 90 capsule 2  . enalapril (VASOTEC) 20 MG tablet TAKE 1 TABLET EVERY MORNING AND 2 TABLETS EVERY EVENING 270 tablet 1  . FLUoxetine (PROZAC) 20 MG capsule TAKE 1 CAPSULE EVERY DAY 90 capsule 0  . pravastatin (PRAVACHOL) 10 MG tablet Take 1 tablet (10 mg total) by mouth  daily. 90 tablet 1  . triamterene-hydrochlorothiazide (MAXZIDE-25) 37.5-25 MG per tablet TAKE 1 TABLET EVERY DAY 90 tablet 1  . VITAMIN D, CHOLECALCIFEROL, PO Take by mouth daily.     No facility-administered medications prior to visit.    ROS Review of Systems  Constitutional: Positive for fatigue. Negative for fever, chills and diaphoresis.  HENT: Positive for congestion, postnasal drip, rhinorrhea, sinus pressure and sore throat. Negative for ear discharge, ear pain, nosebleeds and sneezing.   Respiratory: Positive for cough and shortness of breath. Negative for chest tightness and wheezing.   Cardiovascular: Negative for chest pain, palpitations and leg swelling.  Gastrointestinal: Negative for nausea, vomiting and diarrhea.  Musculoskeletal: Negative for joint swelling.  Skin: Negative for rash.  Neurological: Positive for headaches. Negative for dizziness.    Objective:  BP 178/86 mmHg  Pulse 75  Temp(Src) 98.7 F (37.1 C)  Resp 14  Ht 5' 2.75" (1.594 m)  Wt 243 lb 12.8 oz (110.587 kg)  BMI 43.52 kg/m2  SpO2 94%  Physical Exam  Constitutional: She is oriented to person, place, and time. She appears well-developed and well-nourished. No distress.  HENT:  Head: Normocephalic and atraumatic.  Right Ear: External ear normal.  Left Ear: External ear normal.  Mouth/Throat: No oropharyngeal exudate.  TM on right injected  TM on left blocked by cerumen  Eyes: EOM are normal. Pupils are equal, round, and reactive to light. Right eye exhibits no discharge. Left eye exhibits no discharge. No scleral icterus.  Neck: Normal range of motion. Neck supple. No thyromegaly present.  Cardiovascular: Normal rate, regular rhythm and normal heart sounds.  Exam reveals no gallop and  no friction rub.   No murmur heard. Pulmonary/Chest: Effort normal and breath sounds normal. No respiratory distress. She has no wheezes. She has no rales. She exhibits no tenderness.  Rhonchi left upper and  lower lobes  Lymphadenopathy:    She has no cervical adenopathy.  Neurological: She is alert and oriented to person, place, and time. No cranial nerve deficit. She exhibits normal muscle tone. Coordination normal.  Skin: Skin is warm and dry. No rash noted. She is not diaphoretic.  Psychiatric: She has a normal mood and affect. Her behavior is normal. Judgment and thought content normal.   Assessment & Plan:   Danielle Yates was seen today for sinusitis.  Diagnoses and all orders for this visit:  URI (upper respiratory infection)  Other orders -     azithromycin (ZITHROMAX) 250 MG tablet; Take 2 tablets by mouth on day 1, take 1 tablet by mouth each day after for 4 days.   I am having Danielle Yates start on azithromycin. I am also having her maintain her aspirin EC, (VITAMIN D, CHOLECALCIFEROL, PO), diltiazem, enalapril, FLUoxetine, triamterene-hydrochlorothiazide, and pravastatin.  Meds ordered this encounter  Medications  . azithromycin (ZITHROMAX) 250 MG tablet    Sig: Take 2 tablets by mouth on day 1, take 1 tablet by mouth each day after for 4 days.    Dispense:  6 each    Refill:  0    Order Specific Question:  Supervising Provider    Answer:  Crecencio Mc [[id number]]     Follow-up: Return if symptoms worsen or fail to improve.

## 2015-09-01 NOTE — Progress Notes (Signed)
Pre visit review using our clinic review tool, if applicable. No additional management support is needed unless otherwise documented below in the visit note. 

## 2015-09-01 NOTE — Assessment & Plan Note (Signed)
6 days of cough and congestion worsening. Mucinex okay to continue. Add delsym at night as needed for cough. Z-pack prescribed. Encouraged pt to continue daily culturelle use. FU prn worsening/failure to improve.

## 2015-09-01 NOTE — Patient Instructions (Signed)
Take the Z-pack as directed. Continue your probiotic.   Delsym- Over the counter for cough as needed  Debrox over the counter kit to remove ear wax in left ear.   Call us if not improving some in 48 hours or if fever of 101 or greater.

## 2015-09-09 ENCOUNTER — Ambulatory Visit (INDEPENDENT_AMBULATORY_CARE_PROVIDER_SITE_OTHER): Payer: Medicare PPO | Admitting: Nurse Practitioner

## 2015-09-09 VITALS — BP 162/82 | HR 96 | Temp 98.3°F | Resp 14 | Ht 62.75 in | Wt 242.8 lb

## 2015-09-09 DIAGNOSIS — J209 Acute bronchitis, unspecified: Secondary | ICD-10-CM | POA: Diagnosis not present

## 2015-09-09 MED ORDER — PREDNISONE 10 MG PO TABS: ORAL_TABLET | ORAL | Status: DC

## 2015-09-09 NOTE — Progress Notes (Signed)
Pre visit review using our clinic review tool, if applicable. No additional management support is needed unless otherwise documented below in the visit note. 

## 2015-09-09 NOTE — Patient Instructions (Signed)

## 2015-09-09 NOTE — Progress Notes (Signed)
Patient ID: Danielle Yates, female    DOB: 02/20/1941  Age: 74 y.o. MRN: [id number]  CC: URI   HPI Danielle Yates presents for bronchitis symptoms 1 week.  1) Patient reports symptoms of SOB, Cough, no appetite, continuation from last week. Better today than yesterday. Patient has finished Z-Pak recently and has not taken any other treatments.  History Danielle Yates has a past medical history of Arthropathy, unspecified, site unspecified; Essential hypertension, benign; External hemorrhoids without mention of complication; Tobacco use disorder; Diverticulosis of colon (without mention of hemorrhage); Panic disorder without agoraphobia; Disorder of bone and cartilage, unspecified; Chronic rhinitis; Unspecified vitamin D deficiency; Other abnormal glucose; Obesity, unspecified; Pure hypercholesterolemia; Allergy; Cataract; and Heart murmur.   She has past surgical history that includes Tonsillectomy and adenoidectomy (1946); Vaginal hysterectomy (1982); Cholecystectomy (1985); Appendectomy (1985); Removed spot on face; and Eye surgery (2011).   Her family history includes Aneurysm in her mother; Heart disease in her brother and father; Hyperlipidemia in her brother; Hypertension in her brother and sister; Rheumatic fever in her mother.She reports that she has been smoking Cigarettes.  She has a 25 pack-year smoking history. She has never used smokeless tobacco. She reports that she does not drink alcohol or use illicit drugs.  Outpatient Prescriptions Prior to Visit  Medication Sig Dispense Refill  . aspirin EC 81 MG tablet Take 81 mg by mouth daily.    [name] diltiazem (CARDIZEM CD) 120 MG 24 hr capsule Take 1 capsule by mouth  daily 90 capsule 2  . enalapril (VASOTEC) 20 MG tablet TAKE 1 TABLET EVERY MORNING AND 2 TABLETS EVERY EVENING 270 tablet 1  . FLUoxetine (PROZAC) 20 MG capsule TAKE 1 CAPSULE EVERY DAY 90 capsule 0  . pravastatin (PRAVACHOL) 10 MG tablet Take 1 tablet (10 mg total) by mouth  daily. 90 tablet 1  . triamterene-hydrochlorothiazide (MAXZIDE-25) 37.5-25 MG per tablet TAKE 1 TABLET EVERY DAY 90 tablet 1  . VITAMIN D, CHOLECALCIFEROL, PO Take by mouth daily.    [name] azithromycin (ZITHROMAX) 250 MG tablet Take 2 tablets by mouth on day 1, take 1 tablet by mouth each day after for 4 days. 6 each 0   No facility-administered medications prior to visit.    ROS Review of Systems  Constitutional: Positive for appetite change. Negative for fever, chills, diaphoresis and fatigue.  Respiratory: Positive for cough and shortness of breath.   Gastrointestinal: Negative for nausea, vomiting and diarrhea.  Skin: Negative for rash.    Objective:  BP 162/82 mmHg  Pulse 96  Temp(Src) 98.3 F (36.8 C)  Resp 14  Ht 5' 2.75" (1.594 m)  Wt 242 lb 12.8 oz (110.133 kg)  BMI 43.35 kg/m2  SpO2 94%  Physical Exam  Constitutional: She is oriented to person, place, and time. She appears well-developed and well-nourished. No distress.  HENT:  Head: Normocephalic and atraumatic.  Right Ear: External ear normal.  Left Ear: External ear normal.  Eyes: EOM are normal. Pupils are equal, round, and reactive to light. Right eye exhibits no discharge. Left eye exhibits no discharge. No scleral icterus.  Neck: Normal range of motion. Neck supple.  Cardiovascular: Normal rate, regular rhythm and normal heart sounds.  Exam reveals no gallop and no friction rub.   No murmur heard. Pulmonary/Chest: Effort normal. No respiratory distress. She has no wheezes. She has no rales. She exhibits no tenderness.  Rhonchi left > Right  Lymphadenopathy:    She has no cervical adenopathy.  Neurological: She is alert  and oriented to person, place, and time. No cranial nerve deficit. She exhibits normal muscle tone. Coordination normal.  Skin: Skin is warm and dry. No rash noted. She is not diaphoretic.  Psychiatric: She has a normal mood and affect. Her behavior is normal. Judgment and thought content normal.    Assessment & Plan:   Danielle Yates was seen today for uri.  Diagnoses and all orders for this visit:  Acute bronchitis, unspecified organism  Other orders -     predniSONE (DELTASONE) 10 MG tablet; Take 6 tablets by mouth on day 1 with breakfast then decrease by 1 tablet each day until gone.  I have discontinued [name]'s azithromycin. I am also having her start on predniSONE. Additionally, I am having her maintain her aspirin EC, (VITAMIN D, CHOLECALCIFEROL, PO), diltiazem, enalapril, FLUoxetine, triamterene-hydrochlorothiazide, and pravastatin.  Meds ordered this encounter  Medications  . predniSONE (DELTASONE) 10 MG tablet    Sig: Take 6 tablets by mouth on day 1 with breakfast then decrease by 1 tablet each day until gone.    Dispense:  21 tablet    Refill:  0    Order Specific Question:  Supervising Provider    Answer:  Crecencio Mc [[id number]]     Follow-up: Return if symptoms worsen or fail to improve.

## 2015-09-10 ENCOUNTER — Encounter: Payer: Self-pay | Admitting: Nurse Practitioner

## 2015-09-10 DIAGNOSIS — J209 Acute bronchitis, unspecified: Secondary | ICD-10-CM | POA: Insufficient documentation

## 2015-09-10 NOTE — Assessment & Plan Note (Signed)
Gave prednisone taper to patient she has already completed a Z-Pak. Advised patient this is most likely viral. Patient is up-to-date on pneumonia vaccinations. I described taper instructions with verbal understanding. FU prn worsening/failure to improve.

## 2015-10-08 LAB — HEPATIC FUNCTION PANEL
ALT: 11 U/L (ref 7–35)
AST: 15 U/L (ref 13–35)
Alkaline Phosphatase: 55 U/L (ref 25–125)
BILIRUBIN DIRECT: 0.21 mg/dL (ref 0.01–0.4)
Bilirubin, Total: 0.8 mg/dL

## 2015-10-08 LAB — BASIC METABOLIC PANEL
BUN: 17 mg/dL (ref 4–21)
Creatinine: 0.8 mg/dL (ref 0.5–1.1)
GLUCOSE: 100 mg/dL
Potassium: 4.2 mmol/L (ref 3.4–5.3)
SODIUM: 140 mmol/L (ref 137–147)

## 2015-10-08 LAB — HEMOGLOBIN A1C: Hgb A1c MFr Bld: 5.8 % (ref 4.0–6.0)

## 2015-10-08 LAB — LIPID PANEL
Cholesterol: 179 mg/dL (ref 0–200)
HDL: 63 mg/dL (ref 35–70)
LDL CALC: 98 mg/dL
Triglycerides: 89 mg/dL (ref 40–160)

## 2015-10-10 ENCOUNTER — Encounter: Payer: Self-pay | Admitting: Internal Medicine

## 2015-10-10 ENCOUNTER — Ambulatory Visit (INDEPENDENT_AMBULATORY_CARE_PROVIDER_SITE_OTHER): Payer: Medicare PPO | Admitting: Internal Medicine

## 2015-10-10 VITALS — BP 128/80 | HR 74 | Temp 98.2°F | Resp 18 | Ht 62.75 in | Wt 244.0 lb

## 2015-10-10 DIAGNOSIS — I739 Peripheral vascular disease, unspecified: Secondary | ICD-10-CM | POA: Diagnosis not present

## 2015-10-10 DIAGNOSIS — I10 Essential (primary) hypertension: Secondary | ICD-10-CM

## 2015-10-10 DIAGNOSIS — F419 Anxiety disorder, unspecified: Secondary | ICD-10-CM | POA: Diagnosis not present

## 2015-10-10 DIAGNOSIS — Z23 Encounter for immunization: Secondary | ICD-10-CM | POA: Diagnosis not present

## 2015-10-10 DIAGNOSIS — R739 Hyperglycemia, unspecified: Secondary | ICD-10-CM

## 2015-10-10 DIAGNOSIS — E7800 Pure hypercholesterolemia, unspecified: Secondary | ICD-10-CM

## 2015-10-10 DIAGNOSIS — Z720 Tobacco use: Secondary | ICD-10-CM

## 2015-10-10 MED ORDER — TRIAMTERENE-HCTZ 37.5-25 MG PO TABS: 1.0000 | ORAL_TABLET | Freq: Every day | ORAL | Status: DC

## 2015-10-10 MED ORDER — PRAVASTATIN SODIUM 10 MG PO TABS: 10.0000 mg | ORAL_TABLET | Freq: Every day | ORAL | Status: DC

## 2015-10-10 MED ORDER — ENALAPRIL MALEATE 20 MG PO TABS: ORAL_TABLET | ORAL | Status: DC

## 2015-10-10 MED ORDER — FLUOXETINE HCL 20 MG PO CAPS: 20.0000 mg | ORAL_CAPSULE | Freq: Every day | ORAL | Status: DC

## 2015-10-10 MED ORDER — DILTIAZEM HCL ER COATED BEADS 120 MG PO CP24: ORAL_CAPSULE | ORAL | Status: DC

## 2015-10-10 NOTE — Progress Notes (Signed)
Patient ID: Danielle Yates, female   DOB: 31-Jul-1941, 74 y.o.   MRN: [id number]   Subjective:    Patient ID: Danielle Yates, female    DOB: 12-24-1940, 74 y.o.   MRN: [id number]  HPI  Patient with past history of hypertension, tobacco use, panic disorder and hypercholesterolemia.  She comes in today for a scheduled follow up of these issues.  Was recently seen for cough and congestion.  See Morey Hummingbird Doss's note for details.  This has resolved.  Breathing better and back to baseline.  No chest pain or tightness.  Handling stress well.  No acid reflux reported.  No abdominal pain or cramping.  Bowels stable.  Discussed screening CT chest.  She declines at this time.  Continues to smoke.  Discussed the need to quit.      Past Medical History  Diagnosis Date  . Arthropathy, unspecified, site unspecified   . Essential hypertension, benign   . External hemorrhoids without mention of complication   . Tobacco use disorder   . Diverticulosis of colon (without mention of hemorrhage)   . Panic disorder without agoraphobia   . Disorder of bone and cartilage, unspecified   . Chronic rhinitis   . Unspecified vitamin D deficiency   . Other abnormal glucose   . Obesity, unspecified   . Pure hypercholesterolemia   . Allergy   . Cataract   . 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 Tonsillectomy and adenoidectomy  1946  . Vaginal hysterectomy  1982    fibroid and endometriosis; one ovary remaining  . Cholecystectomy  1985    with open wound  . Appendectomy  1985  . Removed spot on face      [name]  . Eye surgery  2011    CATARACTS -  BOTH   Family History  Problem Relation Age of Onset  . Hypertension Sister   . Heart disease Brother   . Hypertension Brother   . Hyperlipidemia Brother   . Aneurysm Mother   . Rheumatic fever Mother     as an adult  . Heart disease Father     MI x 2   Social History   Social  History  . Marital Status: Widowed    Spouse Name: N/A  . Number of Children: 0  . Years of Education: college   Occupational History  . 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 Smoking status: Current Every Day Smoker -- 0.50 packs/day for 50 years    Types: Cigarettes  . Smokeless tobacco: Never Used  . Alcohol Use: No  . Drug Use: No  . Sexual Activity: Not Asked   Other Topics Concern  . None   Social History Narrative   Lives alone. Widowed after 23 years. Not dating. Has dog. Exercise: Walking; light has not been walking as she used to before her 2 dogs passed away. Tries to walk some. Caffeine: Carbonated beverages, 3 servings/day;Diet coke. 3 a day.      10/02/12  PER PATIENT'S LAVENDER FORM: WALK 5 DAYS/WEEK FOR 30 MINUTES    Outpatient Encounter Prescriptions as of 10/10/2015  Medication Sig  . aspirin EC 81 MG tablet Take 81 mg by mouth daily.  [name] diltiazem (CARDIZEM CD) 120 MG 24 hr capsule Take 1 capsule by mouth  daily  . enalapril (VASOTEC) 20 MG  tablet TAKE 1 TABLET EVERY MORNING AND 2 TABLETS EVERY EVENING  . FLUoxetine (PROZAC) 20 MG capsule Take 1 capsule (20 mg total) by mouth daily.  . pravastatin (PRAVACHOL) 10 MG tablet Take 1 tablet (10 mg total) by mouth daily.  [name] triamterene-hydrochlorothiazide (MAXZIDE-25) 37.5-25 MG tablet Take 1 tablet by mouth daily.  [name] VITAMIN D, CHOLECALCIFEROL, PO Take by mouth daily.  . [DISCONTINUED] diltiazem (CARDIZEM CD) 120 MG 24 hr capsule Take 1 capsule by mouth  daily  . [DISCONTINUED] enalapril (VASOTEC) 20 MG tablet TAKE 1 TABLET EVERY MORNING AND 2 TABLETS EVERY EVENING  . [DISCONTINUED] FLUoxetine (PROZAC) 20 MG capsule TAKE 1 CAPSULE EVERY DAY  . [DISCONTINUED] pravastatin (PRAVACHOL) 10 MG tablet Take 1 tablet (10 mg total) by mouth daily.  . [DISCONTINUED] predniSONE (DELTASONE) 10 MG tablet Take 6 tablets by mouth on day 1 with breakfast then decrease by 1 tablet each day until  gone.  . [DISCONTINUED] triamterene-hydrochlorothiazide (MAXZIDE-25) 37.5-25 MG per tablet TAKE 1 TABLET EVERY DAY   No facility-administered encounter medications on file as of 10/10/2015.    Review of Systems  Constitutional: Negative for appetite change and unexpected weight change.  HENT: Negative for congestion and sinus pressure.   Eyes: Negative for discharge and visual disturbance.  Respiratory: Negative for cough, chest tightness and shortness of breath.        Cough and congestion resolved.  Breathing back to baseline.    Cardiovascular: Negative for chest pain, palpitations and leg swelling.  Gastrointestinal: Negative for nausea, vomiting, abdominal pain and diarrhea.  Genitourinary: Negative for dysuria and difficulty urinating.  Musculoskeletal: Negative for back pain and joint swelling.  Skin: Negative for color change and rash.  Neurological: Negative for dizziness, light-headedness and headaches.  Psychiatric/Behavioral: Negative for dysphoric mood and agitation.       Objective:    Physical Exam  Constitutional: She appears well-developed and well-nourished. No distress.  HENT:  Nose: Nose normal.  Mouth/Throat: Oropharynx is clear and moist.  Eyes: Conjunctivae are normal. Right eye exhibits no discharge. Left eye exhibits no discharge.  Neck: Neck supple. No thyromegaly present.  Cardiovascular: Normal rate and regular rhythm.   Pulmonary/Chest: Breath sounds normal. No respiratory distress. She has no wheezes.  Abdominal: Soft. Bowel sounds are normal. There is no tenderness.  Musculoskeletal: She exhibits no edema or tenderness.  Lymphadenopathy:    She has no cervical adenopathy.  Skin: No rash noted. No erythema.  Psychiatric: She has a normal mood and affect. Her behavior is normal.    BP 128/80 mmHg  Pulse 74  Temp(Src) 98.2 F (36.8 C) (Oral)  Resp 18  Ht 5' 2.75" (1.594 m)  Wt 244 lb (110.678 kg)  BMI 43.56 kg/m2  SpO2 95% Wt Readings from  Last 3 Encounters:  10/10/15 244 lb (110.678 kg)  09/09/15 242 lb 12.8 oz (110.133 kg)  09/01/15 243 lb 12.8 oz (110.587 kg)     Lab Results  Component Value Date   WBC 5.5 01/30/2015   HGB 13.8 01/30/2015   HCT 40 01/30/2015   PLT 221 01/30/2015   GLUCOSE 99 05/23/2013   CHOL 179 10/08/2015   TRIG 89 10/08/2015   HDL 63 10/08/2015   LDLCALC 98 10/08/2015   ALT 11 10/08/2015   AST 15 10/08/2015   NA 140 10/08/2015   K 4.2 10/08/2015   CL 99 05/23/2013   CREATININE 0.8 10/08/2015   BUN 17 10/08/2015   CO2 26 05/23/2013   TSH 3.36 01/30/2015  HGBA1C 5.8 10/08/2015       Assessment & Plan:   Problem List Items Addressed This Visit    Anxiety    Doing well on prozac.  Follow.        Relevant Medications   FLUoxetine (PROZAC) 20 MG capsule   Essential hypertension, benign    Blood pressure under good control.  Continue same medication regimen.  Follow pressures.  Follow metabolic panel.        Relevant Medications   triamterene-hydrochlorothiazide (MAXZIDE-25) 37.5-25 MG tablet   pravastatin (PRAVACHOL) 10 MG tablet   enalapril (VASOTEC) 20 MG tablet   diltiazem (CARDIZEM CD) 120 MG 24 hr capsule   Hyperglycemia    A1c just checked 10/07/15 - 5.8.  Low carb diet and exercise.  Follow met b and a1c.       PAD (peripheral artery disease) ([place])    Followed by vascular surgery.  Has bilateral common iliac artery stenosis.       Relevant Medications   triamterene-hydrochlorothiazide (MAXZIDE-25) 37.5-25 MG tablet   pravastatin (PRAVACHOL) 10 MG tablet   enalapril (VASOTEC) 20 MG tablet   diltiazem (CARDIZEM CD) 120 MG 24 hr capsule   Pure hypercholesterolemia    On pravastatin and tolerating.  Lipid panel just checked and wnl.  LDL 98.  Follow lipid panel and liver function tests.        Relevant Medications   triamterene-hydrochlorothiazide (MAXZIDE-25) 37.5-25 MG tablet   pravastatin (PRAVACHOL) 10 MG tablet   enalapril (VASOTEC) 20 MG tablet   diltiazem  (CARDIZEM CD) 120 MG 24 hr capsule   Severe obesity (BMI >= 40) (H[facility])    Discussed diet and exercise.        Tobacco abuse    Discussed the need to quit smoking.  She declines to quit.  Discussed screening CT.  She declines.  Follow.         Other Visit Diagnoses    Encounter for immunization    -  Primary        Einar Pheasant, MD

## 2015-10-10 NOTE — Patient Instructions (Signed)

## 2015-10-10 NOTE — Progress Notes (Signed)
Pre-visit discussion using our clinic review tool. No additional management support is needed unless otherwise documented below in the visit note.  

## 2015-10-12 ENCOUNTER — Encounter: Payer: Self-pay | Admitting: Internal Medicine

## 2015-10-12 NOTE — Assessment & Plan Note (Signed)
A1c just checked 10/07/15 - 5.8.  Low carb diet and exercise.  Follow met b and a1c.

## 2015-10-12 NOTE — Assessment & Plan Note (Signed)
Doing well on prozac.  Follow  

## 2015-10-12 NOTE — Assessment & Plan Note (Signed)
Discussed the need to quit smoking.  She declines to quit.  Discussed screening CT.  She declines.  Follow.

## 2015-10-12 NOTE — Assessment & Plan Note (Signed)
Followed by vascular surgery.  Has bilateral common iliac artery stenosis.

## 2015-10-12 NOTE — Assessment & Plan Note (Signed)
Discussed diet and exercise 

## 2015-10-12 NOTE — Assessment & Plan Note (Signed)
On pravastatin and tolerating.  Lipid panel just checked and wnl.  LDL 98.  Follow lipid panel and liver function tests.

## 2015-10-12 NOTE — Assessment & Plan Note (Signed)
Blood pressure under good control.  Continue same medication regimen.  Follow pressures.  Follow metabolic panel.   

## 2015-11-11 ENCOUNTER — Ambulatory Visit (INDEPENDENT_AMBULATORY_CARE_PROVIDER_SITE_OTHER): Payer: Medicare PPO | Admitting: Nurse Practitioner

## 2015-11-11 ENCOUNTER — Encounter: Payer: Self-pay | Admitting: Nurse Practitioner

## 2015-11-11 VITALS — BP 118/68 | HR 68 | Temp 99.0°F | Wt 246.0 lb

## 2015-11-11 DIAGNOSIS — J069 Acute upper respiratory infection, unspecified: Secondary | ICD-10-CM

## 2015-11-11 MED ORDER — FLUTICASONE PROPIONATE 50 MCG/ACT NA SUSP: 2.0000 | Freq: Every day | NASAL | Status: DC

## 2015-11-11 NOTE — Progress Notes (Signed)
Patient ID: Danielle Yates, female    DOB: 16-Jul-1941  Age: 74 y.o. MRN: [id number]  CC: Sore Throat and Nasal Congestion   HPI Danielle Yates presents for CC of sore throat and congestion.   1) Sore throat 3 days ago, nasal congestion, drainage, coughing up mucous. Prednisone was helpful last month for bronchitis.   Treatment to date: Tylenol only- somewhat helpful Sick Contacts:74 year old came to her house recently  History Danielle Yates has a past medical history of Arthropathy, unspecified, site unspecified; Essential hypertension, benign; External hemorrhoids without mention of complication; Tobacco use disorder; Diverticulosis of colon (without mention of hemorrhage); Panic disorder without agoraphobia; Disorder of bone and cartilage, unspecified; Chronic rhinitis; Unspecified vitamin D deficiency; Other abnormal glucose; Obesity, unspecified; Pure hypercholesterolemia; Allergy; Cataract; and Heart murmur.   She has past surgical history that includes Tonsillectomy and adenoidectomy (1946); Vaginal hysterectomy (1982); Cholecystectomy (1985); Appendectomy (1985); Removed spot on face; and Eye surgery (2011).   Her family history includes Aneurysm in her mother; Heart disease in her brother and father; Hyperlipidemia in her brother; Hypertension in her brother and sister; Rheumatic fever in her mother.She reports that she has been smoking Cigarettes.  She has a 25 pack-year smoking history. She has never used smokeless tobacco. She reports that she does not drink alcohol or use illicit drugs.  Outpatient Prescriptions Prior to Visit  Medication Sig Dispense Refill  . aspirin EC 81 MG tablet Take 81 mg by mouth daily.    [name] diltiazem (CARDIZEM CD) 120 MG 24 hr capsule Take 1 capsule by mouth  daily 90 capsule 3  . enalapril (VASOTEC) 20 MG tablet TAKE 1 TABLET EVERY MORNING AND 2 TABLETS EVERY EVENING 270 tablet 1  . FLUoxetine (PROZAC) 20 MG capsule Take 1 capsule (20 mg total) by mouth  daily. 90 capsule 1  . pravastatin (PRAVACHOL) 10 MG tablet Take 1 tablet (10 mg total) by mouth daily. 90 tablet 1  . triamterene-hydrochlorothiazide (MAXZIDE-25) 37.5-25 MG tablet Take 1 tablet by mouth daily. 90 tablet 1  . VITAMIN D, CHOLECALCIFEROL, PO Take by mouth daily.     No facility-administered medications prior to visit.    ROS Review of Systems  Constitutional: Positive for fatigue. Negative for fever, chills and diaphoresis.  HENT: Positive for congestion, ear pain, postnasal drip, rhinorrhea, sinus pressure, sneezing and sore throat. Negative for facial swelling, trouble swallowing and voice change.   Eyes: Negative for visual disturbance.  Respiratory: Positive for cough. Negative for chest tightness, shortness of breath and wheezing.   Gastrointestinal: Negative for nausea, vomiting and diarrhea.  Musculoskeletal: Negative for myalgias.    Objective:  BP 118/68 mmHg  Pulse 68  Temp(Src) 99 F (37.2 C) (Oral)  Wt 246 lb (111.585 kg)  SpO2 98%  Physical Exam  Constitutional: She is oriented to person, place, and time. She appears well-developed and well-nourished. No distress.  HENT:  Head: Normocephalic and atraumatic.  Right Ear: External ear normal.  Left Ear: External ear normal.  Mouth/Throat: No oropharyngeal exudate.  TM's clear on right, Left TM not visible due to cerumen  Eyes: EOM are normal. Pupils are equal, round, and reactive to light. Right eye exhibits no discharge. Left eye exhibits no discharge. No scleral icterus.  Neck: Normal range of motion. Neck supple.  Cardiovascular: Normal rate, regular rhythm and normal heart sounds.  Exam reveals no gallop and no friction rub.   No murmur heard. Pulmonary/Chest: Effort normal and breath sounds normal. No respiratory distress.  She has no wheezes. She has no rales. She exhibits no tenderness.  Lymphadenopathy:    She has no cervical adenopathy.  Neurological: She is alert and oriented to person,  place, and time. No cranial nerve deficit. She exhibits normal muscle tone. Coordination normal.  Skin: Skin is warm and dry. No rash noted. She is not diaphoretic.  Psychiatric: She has a normal mood and affect. Her behavior is normal. Judgment and thought content normal.   Assessment & Plan:   There are no diagnoses linked to this encounter. I am having Ms. Stankey start on fluticasone. I am also having her maintain her aspirin EC, (VITAMIN D, CHOLECALCIFEROL, PO), triamterene-hydrochlorothiazide, pravastatin, FLUoxetine, enalapril, and diltiazem.  Meds ordered this encounter  Medications  . fluticasone (FLONASE) 50 MCG/ACT nasal spray    Sig: Place 2 sprays into both nostrils daily.    Dispense:  16 g    Refill:  6    Order Specific Question:  Supervising Provider    Answer:  Crecencio Mc [[id number]]     Follow-up: Return if symptoms worsen or fail to improve.

## 2015-11-11 NOTE — Assessment & Plan Note (Signed)
Probable viral etiology. Will try flonase for symptoms- sent to pharmacy. OCT mucinex and benadryl at night.  Pharyngitis handout with home care tips given to pt. FU prn worsening/failure to improve.

## 2015-11-11 NOTE — Patient Instructions (Signed)
Mucinex plain and Flonase for care at this time.  Benadryl at night will help keep the drainage to a minimum  Pharyngitis Pharyngitis is redness, pain, and swelling (inflammation) of your pharynx.  CAUSES  Pharyngitis is usually caused by infection. Most of the time, these infections are from viruses (viral) and are part of a cold. However, sometimes pharyngitis is caused by bacteria (bacterial). Pharyngitis can also be caused by allergies. Viral pharyngitis may be spread from person to person by coughing, sneezing, and personal items or utensils (cups, forks, spoons, toothbrushes). Bacterial pharyngitis may be spread from person to person by more intimate contact, such as kissing.  SIGNS AND SYMPTOMS  Symptoms of pharyngitis include:   Sore throat.   Tiredness (fatigue).   Low-grade fever.   Headache.  Joint pain and muscle aches.  Skin rashes.  Swollen lymph nodes.  Plaque-like film on throat or tonsils (often seen with bacterial pharyngitis). DIAGNOSIS  Your health care provider will ask you questions about your illness and your symptoms. Your medical history, along with a physical exam, is often all that is needed to diagnose pharyngitis. Sometimes, a rapid strep test is done. Other lab tests may also be done, depending on the suspected cause.  TREATMENT  Viral pharyngitis will usually get better in 3-4 days without the use of medicine. Bacterial pharyngitis is treated with medicines that kill germs (antibiotics).  HOME CARE INSTRUCTIONS   Drink enough water and fluids to keep your urine clear or pale yellow.   Only take over-the-counter or prescription medicines as directed by your health care provider:   If you are prescribed antibiotics, make sure you finish them even if you start to feel better.   Do not take aspirin.   Get lots of rest.   Gargle with 8 oz of salt water ( tsp of salt per 1 qt of water) as often as every 1-2 hours to soothe your throat.    Throat lozenges (if you are not at risk for choking) or sprays may be used to soothe your throat. SEEK MEDICAL CARE IF:   You have large, tender lumps in your neck.  You have a rash.  You cough up green, yellow-brown, or bloody spit. SEEK IMMEDIATE MEDICAL CARE IF:   Your neck becomes stiff.  You drool or are unable to swallow liquids.  You vomit or are unable to keep medicines or liquids down.  You have severe pain that does not go away with the use of recommended medicines.  You have trouble breathing (not caused by a stuffy nose). MAKE SURE YOU:   Understand these instructions.  Will watch your condition.  Will get help right away if you are not doing well or get worse.   This information is not intended to replace advice given to you by your health care provider. Make sure you discuss any questions you have with your health care provider.   Document Released: 11/22/2005 Document Revised: 09/12/2013 Document Reviewed: 07/30/2013 Elsevier Interactive Patient Education [employer].

## 2015-12-18 ENCOUNTER — Telehealth: Payer: Self-pay | Admitting: Internal Medicine

## 2015-12-18 NOTE — Telephone Encounter (Signed)
Left msg to call office to schedule AWV/msn °

## 2016-02-02 ENCOUNTER — Ambulatory Visit (INDEPENDENT_AMBULATORY_CARE_PROVIDER_SITE_OTHER): Payer: PPO

## 2016-02-02 VITALS — BP 126/62 | HR 64 | Temp 97.4°F | Resp 14 | Ht 62.0 in | Wt 246.1 lb

## 2016-02-02 DIAGNOSIS — Z0000 Encounter for general adult medical examination without abnormal findings: Secondary | ICD-10-CM

## 2016-02-02 NOTE — Progress Notes (Signed)
Subjective:   Danielle Yates is a 75 y.o. female who presents for an Initial Medicare Annual Wellness Visit.  Review of Systems    No ROS.  Medicare Wellness Visit.  Cardiac Risk Factors include: advanced age (>61men, >59 women);obesity (BMI >30kg/m2);smoking/ tobacco exposure;hypertension     Objective:    Today's Vitals   02/02/16 1446  BP: 126/62  Pulse: 64  Temp: 97.4 F (36.3 C)  TempSrc: Oral  Resp: 14  Height: 5\' 2"  (1.575 m)  Weight: 246 lb 1.9 oz (111.639 kg)  SpO2: 98%    Current Medications (verified) Outpatient Encounter Prescriptions as of 02/02/2016  Medication Sig  . aspirin EC 81 MG tablet Take 81 mg by mouth daily.  [name] diltiazem (CARDIZEM CD) 120 MG 24 hr capsule Take 1 capsule by mouth  daily  . enalapril (VASOTEC) 20 MG tablet TAKE 1 TABLET EVERY MORNING AND 2 TABLETS EVERY EVENING  . FLUoxetine (PROZAC) 20 MG capsule Take 1 capsule (20 mg total) by mouth daily.  . fluticasone (FLONASE) 50 MCG/ACT nasal spray Place 2 sprays into both nostrils daily.  . pravastatin (PRAVACHOL) 10 MG tablet Take 1 tablet (10 mg total) by mouth daily.  . Probiotic Product (PROBIOTIC DAILY PO) Take 1 tablet by mouth.  . triamterene-hydrochlorothiazide (MAXZIDE-25) 37.5-25 MG tablet Take 1 tablet by mouth daily.  [name] VITAMIN D, CHOLECALCIFEROL, PO Take by mouth daily.   No facility-administered encounter medications on file as of 02/02/2016.    Allergies (verified) Codeine; Crestor; and Nickel   History: Past Medical History  Diagnosis Date  . Arthropathy, unspecified, site unspecified   . Essential hypertension, benign   . External hemorrhoids without mention of complication   . Tobacco use disorder   . Diverticulosis of colon (without mention of hemorrhage)   . Panic disorder without agoraphobia   . Disorder of bone and cartilage, unspecified   . Chronic rhinitis   . Unspecified vitamin D deficiency   . Other abnormal glucose   . Obesity, unspecified   . Pure  hypercholesterolemia   . Allergy   . Cataract   . 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 Tonsillectomy and adenoidectomy  1946  . Vaginal hysterectomy  1982    fibroid and endometriosis; one ovary remaining  . Cholecystectomy  1985    with open wound  . Appendectomy  1985  . Removed spot on face      [name]  . Eye surgery  2011    CATARACTS -  BOTH   Family History  Problem Relation Age of Onset  . Hypertension Sister   . Heart disease Brother   . Hypertension Brother   . Hyperlipidemia Brother   . Aneurysm Mother   . Rheumatic fever Mother     as an adult  . Heart disease Father     MI x 2   Social History   Occupational History  . 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 Smoking status: Current Every Day Smoker -- 0.50 packs/day for 50 years    Types: Cigarettes  . Smokeless tobacco: Never Used  . Alcohol Use: No  . Drug Use: No  . Sexual Activity: No    Tobacco Counseling Ready to quit: No Counseling given: Not Answered    Activities of Daily Living In your present state of health, do  you have any difficulty performing the following activities: 02/02/2016  Hearing? N  Vision? N  Difficulty concentrating or making decisions? N  Walking or climbing stairs? N  Dressing or bathing? N  Doing errands, shopping? N  Preparing Food and eating ? N  Using the Toilet? N  In the past six months, have you accidently leaked urine? N  Do you have problems with loss of bowel control? N  Managing your Medications? N  Managing your Finances? N  Housekeeping or managing your Housekeeping? N    Immunizations and Health Maintenance Immunization History  Administered Date(s) Administered  . Influenza Split 09/05/2013, 08/20/2014  . Influenza,inj,Quad PF,36+ Mos 10/10/2015  . Pneumococcal Conjugate-13 06/03/2014  . Pneumococcal Polysaccharide-23 12/06/2006    . Tdap 02/22/2011  . Zoster 12/06/2006   There are no preventive care reminders to display for this patient.  Patient Care Team: Einar Pheasant, MD as PCP - General (Internal Medicine)  Indicate any recent Medical Services you may have received from other than [facility] providers in the past year (date may be approximate).     Assessment:   This is a routine wellness examination for Danielle Yates. The goal of the wellness visit is to assist the patient how to close the gaps in care and create a preventative care plan for the patient.   Taking VIT D Calcium as appropriate/Osteoporosis risk reviewed.  Medications reviewed; taking without issues or barriers.  Safety issues reviewed; smoke detectors in the home. No firearms in the home. Wears seatbelts when driving or riding with others. No violence in the home.  No identified risk were noted; The patient was oriented x 3; appropriate in dress and manner and no objective failures at ADL's or IADL's.   Morbid obesity-stable and followed by PCP Periph vascular dis-stable and followed by Dr. Fletcher Anon  Patient Concerns:  Smoking; educational material provided.  Requests new solutions to try to quit smoking. Follow up with PCP at scheduled follow up in 1 week.  Hearing/Vision screen Hearing Screening Comments: Passes the whisper test Vision Screening Comments: Follow[facility] Bilateral cataracts extracted Macular degeneration Wears glasses  Dietary issues and exercise activities discussed: Current Exercise Habits:: The patient does not participate in regular exercise at present  Goals    . Increase lean proteins     Choose low carb foods, fruits and vegetable Lean proteins (chicken, [place], fish)    . Increase physical activity     Start water walking classes with a friend     . Increase water intake     Stay hydrated! Drink plenty of water      Depression Screen PHQ 2/9 Scores 02/02/2016 06/06/2015 06/03/2014  05/28/2013 05/28/2013 03/27/2013  PHQ - 2 Score 0 0 0 0 0 0    Fall Risk Fall Risk  02/02/2016 06/06/2015 06/03/2014 05/28/2013 05/28/2013  Falls in the past year? No No No No No    Cognitive Function: MMSE - Mini Mental State Exam 02/02/2016  Orientation to time 5  Orientation to Place 5  Registration 3  Attention/ Calculation 5  Recall 3  Language- name 2 objects 2  Language- repeat 1  Language- follow 3 step command 3  Language- read & follow direction 1  Write a sentence 1  Copy design 1  Total score 30    Screening Tests Health Maintenance  Topic Date Due  . MAMMOGRAM  06/29/2016  . INFLUENZA VACCINE  07/06/2016  . TETANUS/TDAP  02/21/2021  . COLONOSCOPY  04/05/2021  .  DEXA SCAN  Completed  . ZOSTAVAX  Completed  . PNA vac Low Risk Adult  Completed      Plan:   End of life planning; Advance aging; Advanced directives discussed. Copy of current HCPOA/Living Will requested.   During the course of the visit, Danielle Yates was educated and counseled about the following appropriate screening and preventive services:   Vaccines to include Pneumoccal, Influenza, Hepatitis B, Td, Zostavax, HCV  Electrocardiogram  Cardiovascular disease screening  Colorectal cancer screening  Bone density screening  Diabetes screening  Glaucoma screening  Mammography/PAP  Nutrition counseling  Smoking cessation counseling  Patient Instructions (the written plan) were given to the patient.    Varney Biles, LPN   [id number]    Reviewed above information.  Will f/u at scheduled appt as outlined.    Dr Nicki Reaper

## 2016-02-02 NOTE — Patient Instructions (Addendum)
Danielle Yates , Thank you for taking time to come for your Medicare Wellness Visit. I appreciate your ongoing commitment to your health goals. Please review the following plan we discussed and let me know if I can assist you in the future.   Return in 1 week for follow up with Dr. Nicki Reaper.   This is a list of the screening recommended for you and due dates:  Health Maintenance  Topic Date Due  . Mammogram  06/29/2016  . Flu Shot  07/06/2016  . Tetanus Vaccine  02/21/2021  . Colon Cancer Screening  04/05/2021  . DEXA scan (bone density measurement)  Completed  . Shingles Vaccine  Completed  . Pneumonia vaccines  Completed    HOW DO I GET READY TO QUIT? When you decide to quit smoking, create a plan to make sure that you are successful. Before you quit:  Pick a date to quit. Set a date within the next two weeks to give you time to prepare.  Write down the reasons why you are quitting. Keep this list in places where you will see it often, such as on your bathroom mirror or in your car or wallet.  Identify the people, places, things, and activities that make you want to smoke (triggers) and avoid them. Make sure to take these actions:  Throw away all cigarettes at home, at work, and in your car.  Throw away smoking accessories, such as Scientist, research (medical).  Clean your car and make sure to empty the ashtray.  Clean your home, including curtains and carpets.  Tell your family, friends, and coworkers that you are quitting. Support from your loved ones can make quitting easier.  Talk with your health care provider about your options for quitting smoking.  Find out what treatment options are covered by your health insurance. WHAT STRATEGIES CAN I USE TO QUIT SMOKING?  Talk with your healthcare provider about different strategies to quit smoking. Some strategies include:  Quitting smoking altogether instead of gradually lessening how much you smoke over a period of time. Research  shows that quitting "cold [place]" is more successful than gradually quitting.  Attending in-person counseling to help you build problem-solving skills. You are more likely to have success in quitting if you attend several counseling sessions. Even short sessions of 10 minutes can be effective.  Finding resources and support systems that can help you to quit smoking and remain smoke-free after you quit. These resources are most helpful when you use them often. They can include:  Online chats with a Social worker.  Telephone quitlines.  Printed Furniture conservator/restorer.  Support groups or group counseling.  Text messaging programs.  Mobile phone applications.  Taking medicines to help you quit smoking. (If you are pregnant or breastfeeding, talk with your health care provider first.) Some medicines contain nicotine and some do not. Both types of medicines help with cravings, but the medicines that include nicotine help to relieve withdrawal symptoms. Your health care provider may recommend:  Nicotine patches, gum, or lozenges.  Nicotine inhalers or sprays.  Non-nicotine medicine that is taken by mouth. Talk with your health care provider about combining strategies, such as taking medicines while you are also receiving in-person counseling. Using these two strategies together makes you more likely to succeed in quitting than if you used either strategy on its own. If you are pregnant or breastfeeding, talk with your health care provider about finding counseling or other support strategies to quit smoking. Do not take medicine  to help you quit smoking unless told to do so by your health care provider. WHAT THINGS CAN I DO TO MAKE IT EASIER TO QUIT? Quitting smoking might feel overwhelming at first, but there is a lot that you can do to make it easier. Take these important actions:  Reach out to your family and friends and ask that they support and encourage you during this time. Call telephone  quitlines, reach out to support groups, or work with a counselor for support.  Ask people who smoke to avoid smoking around you.  Avoid places that trigger you to smoke, such as bars, parties, or smoke-break areas at work.  Spend time around people who do not smoke.  Lessen stress in your life, because stress can be a smoking trigger for some people. To lessen stress, try:  Exercising regularly.  Deep-breathing exercises.  Yoga.  Meditating.  Performing a body scan. This involves closing your eyes, scanning your body from head to toe, and noticing which parts of your body are particularly tense. Purposefully relax the muscles in those areas.  Download or purchase mobile phone or tablet apps (applications) that can help you stick to your quit plan by providing reminders, tips, and encouragement. There are many free apps, such as QuitGuide from the [employer] Office manager for Disease Control and Prevention). You can find other support for quitting smoking (smoking cessation) through smokefree.gov and other websites. HOW WILL I FEEL WHEN I QUIT SMOKING? Within the first 24 hours of quitting smoking, you may start to feel some withdrawal symptoms. These symptoms are usually most noticeable 2-3 days after quitting, but they usually do not last beyond 2-3 weeks. Changes or symptoms that you might experience include:  Mood swings.  Restlessness, anxiety, or irritation.  Difficulty concentrating.  Dizziness.  Strong cravings for sugary foods in addition to nicotine.  Mild weight gain.  Constipation.  Nausea.  Coughing or a sore throat.  Changes in how your medicines work in your body.  A depressed mood.  Difficulty sleeping (insomnia). After the first 2-3 weeks of quitting, you may start to notice more positive results, such as:  Improved sense of smell and taste.  Decreased coughing and sore throat.  Slower heart rate.  Lower blood pressure.  Clearer skin.  The ability to  breathe more easily.  Fewer sick days. Quitting smoking is very challenging for most people. Do not get discouraged if you are not successful the first time. Some people need to make many attempts to quit before they achieve long-term success. Do your best to stick to your quit plan, and talk with your health care provider if you have any questions or concerns.   This information is not intended to replace advice given to you by your health care provider. Make sure you discuss any questions you have with your health care provider.   Document Released: 11/16/2001 Document Revised: 04/08/2015 Document Reviewed: 04/08/2015 Elsevier Interactive Patient Education [employer].

## 2016-02-06 ENCOUNTER — Other Ambulatory Visit: Payer: Self-pay | Admitting: Internal Medicine

## 2016-02-06 DIAGNOSIS — I10 Essential (primary) hypertension: Secondary | ICD-10-CM | POA: Diagnosis not present

## 2016-02-06 DIAGNOSIS — E7800 Pure hypercholesterolemia, unspecified: Secondary | ICD-10-CM | POA: Diagnosis not present

## 2016-02-06 DIAGNOSIS — R739 Hyperglycemia, unspecified: Secondary | ICD-10-CM | POA: Diagnosis not present

## 2016-02-07 LAB — LIPID PANEL W/O CHOL/HDL RATIO
CHOLESTEROL TOTAL: 174 mg/dL (ref 100–199)
HDL: 61 mg/dL (ref >39)
LDL CALC: 95 mg/dL (ref 0–99)
Triglycerides: 88 mg/dL (ref 0–149)
VLDL CHOLESTEROL CAL: 18 mg/dL (ref 5–40)

## 2016-02-07 LAB — HEPATIC FUNCTION PANEL
ALBUMIN: 4.2 g/dL (ref 3.5–4.8)
ALT: 8 IU/L (ref 0–32)
ALT: 8 U/L (ref 7–35)
AST: 14 IU/L (ref 0–40)
AST: 14 U/L (ref 13–35)
Alkaline Phosphatase: 61 IU/L (ref 39–117)
Alkaline Phosphatase: 61 U/L (ref 25–125)
BILIRUBIN DIRECT: 0.21 mg/dL (ref 0.01–0.4)
BILIRUBIN TOTAL: 0.8 mg/dL (ref 0.0–1.2)
BILIRUBIN, DIRECT: 0.21 mg/dL (ref 0.00–0.40)
Bilirubin, Total: 0.8 mg/dL
TOTAL PROTEIN: 6.4 g/dL (ref 6.0–8.5)

## 2016-02-07 LAB — BASIC METABOLIC PANEL
BUN / CREAT RATIO: 19 (ref 11–26)
BUN: 14 mg/dL (ref 4–21)
BUN: 14 mg/dL (ref 8–27)
CO2: 25 mmol/L (ref 18–29)
CREATININE: 0.74 mg/dL (ref 0.57–1.00)
Calcium: 9.1 mg/dL (ref 8.7–10.3)
Chloride: 101 mmol/L (ref 96–106)
Creatinine: 0.7 mg/dL (ref 0.5–1.1)
GFR calc non Af Amer: 80 mL/min/{1.73_m2} (ref >59)
GFR, EST AFRICAN AMERICAN: 92 mL/min/{1.73_m2} (ref >59)
GLUCOSE: 96 mg/dL
GLUCOSE: 96 mg/dL (ref 65–99)
POTASSIUM: 4.4 mmol/L (ref 3.4–5.3)
Potassium: 4.4 mmol/L (ref 3.5–5.2)
SODIUM: 144 mmol/L (ref 134–144)
SODIUM: 144 mmol/L (ref 137–147)

## 2016-02-07 LAB — LIPID PANEL
Cholesterol: 174 mg/dL (ref 0–200)
HDL: 61 mg/dL (ref 35–70)
LDL CALC: 95 mg/dL
Triglycerides: 88 mg/dL (ref 40–160)

## 2016-02-07 LAB — HEMOGLOBIN A1C: HEMOGLOBIN A1C: 5.6

## 2016-02-07 LAB — HGB A1C W/O EAG: Hgb A1c MFr Bld: 5.6 % (ref 4.8–5.6)

## 2016-02-09 ENCOUNTER — Ambulatory Visit (INDEPENDENT_AMBULATORY_CARE_PROVIDER_SITE_OTHER): Payer: PPO | Admitting: Internal Medicine

## 2016-02-09 ENCOUNTER — Encounter: Payer: Self-pay | Admitting: Internal Medicine

## 2016-02-09 VITALS — BP 124/84 | HR 69 | Temp 98.2°F | Resp 18 | Ht 62.75 in | Wt 248.0 lb

## 2016-02-09 DIAGNOSIS — I739 Peripheral vascular disease, unspecified: Secondary | ICD-10-CM

## 2016-02-09 DIAGNOSIS — I10 Essential (primary) hypertension: Secondary | ICD-10-CM | POA: Diagnosis not present

## 2016-02-09 DIAGNOSIS — Z720 Tobacco use: Secondary | ICD-10-CM

## 2016-02-09 DIAGNOSIS — E7800 Pure hypercholesterolemia, unspecified: Secondary | ICD-10-CM

## 2016-02-09 DIAGNOSIS — R739 Hyperglycemia, unspecified: Secondary | ICD-10-CM

## 2016-02-09 MED ORDER — DILTIAZEM HCL ER COATED BEADS 120 MG PO CP24: ORAL_CAPSULE | ORAL | Status: DC

## 2016-02-09 MED ORDER — PRAVASTATIN SODIUM 10 MG PO TABS: 10.0000 mg | ORAL_TABLET | Freq: Every day | ORAL | Status: DC

## 2016-02-09 MED ORDER — ENALAPRIL MALEATE 20 MG PO TABS: ORAL_TABLET | ORAL | Status: DC

## 2016-02-09 MED ORDER — FLUOXETINE HCL 20 MG PO CAPS: 20.0000 mg | ORAL_CAPSULE | Freq: Every day | ORAL | Status: DC

## 2016-02-09 NOTE — Progress Notes (Signed)
Pre-visit discussion using our clinic review tool. No additional management support is needed unless otherwise documented below in the visit note.  

## 2016-02-09 NOTE — Progress Notes (Signed)
Patient ID: Danielle Yates, female   DOB: [birth year], 75 y.o.   MRN: [id number]   Subjective:    Patient ID: Danielle Yates, female    DOB: [birth year], 75 y.o.   MRN: [id number]  HPI  Patient with past history of hypercholesterolemia, tobacco abuse, PAD and hypertension.  She comes in today to follow up on these issues.  She reports she is doing well.  Tries to stay active.  No cardiac symptoms with increased activity or exertion.  No sob.  No acid reflux.  No abdominal pain or cramping.  Bowels stable.  Reviewed labs.  Cholesterol looks good.  LDL 95.     Past Medical History  Diagnosis Date  . Arthropathy, unspecified, site unspecified   . Essential hypertension, benign   . External hemorrhoids without mention of complication   . Tobacco use disorder   . Diverticulosis of colon (without mention of hemorrhage)   . Panic disorder without agoraphobia   . Disorder of bone and cartilage, unspecified   . Chronic rhinitis   . Unspecified vitamin D deficiency   . Other abnormal glucose   . Obesity, unspecified   . Pure hypercholesterolemia   . Allergy   . Cataract   . 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 Tonsillectomy and adenoidectomy  1946  . Vaginal hysterectomy  1982    fibroid and endometriosis; one ovary remaining  . Cholecystectomy  1985    with open wound  . Appendectomy  1985  . Removed spot on face      [name]  . Eye surgery  2011    CATARACTS -  BOTH   Family History  Problem Relation Age of Onset  . Hypertension Sister   . Heart disease Brother   . Hypertension Brother   . Hyperlipidemia Brother   . Aneurysm Mother   . Rheumatic fever Mother     as an adult  . Heart disease Father     MI x 2   Social History   Social History  . Marital Status: Widowed    Spouse Name: N/A  . Number of Children: 0  . Years of Education: college   Occupational History  . 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 Smoking status: Current Every Day Smoker -- 0.50 packs/day for 50 years    Types: Cigarettes  . Smokeless tobacco: Never Used  . Alcohol Use: No  . Drug Use: No  . Sexual Activity: No   Other Topics Concern  . None   Social History Narrative   Lives alone. Widowed after 23 years. Not dating. Has dog. Exercise: Walking; light has not been walking as she used to before her 2 dogs passed away. Tries to walk some. Caffeine: Carbonated beverages, 3 servings/day;Diet coke. 3 a day.      10/02/12  PER PATIENT'S LAVENDER FORM: WALK 5 DAYS/WEEK FOR 30 MINUTES    Outpatient Encounter Prescriptions as of 02/09/2016  Medication Sig  . aspirin EC 81 MG tablet Take 81 mg by mouth daily.  [name] diltiazem (CARDIZEM CD) 120 MG 24 hr capsule Take 1 capsule by mouth  daily  . enalapril (VASOTEC) 20 MG tablet TAKE 1 TABLET EVERY MORNING AND 2 TABLETS EVERY EVENING  . FLUoxetine (PROZAC) 20 MG capsule Take 1 capsule (20 mg total) by mouth daily.  [name]  fluticasone (FLONASE) 50 MCG/ACT nasal spray Place 2 sprays into both nostrils daily.  . pravastatin (PRAVACHOL) 10 MG tablet Take 1 tablet (10 mg total) by mouth daily.  . Probiotic Product (PROBIOTIC DAILY PO) Take 1 tablet by mouth.  . triamterene-hydrochlorothiazide (MAXZIDE-25) 37.5-25 MG tablet Take 1 tablet by mouth daily.  [name] VITAMIN D, CHOLECALCIFEROL, PO Take by mouth daily.  . [DISCONTINUED] diltiazem (CARDIZEM CD) 120 MG 24 hr capsule Take 1 capsule by mouth  daily  . [DISCONTINUED] enalapril (VASOTEC) 20 MG tablet TAKE 1 TABLET EVERY MORNING AND 2 TABLETS EVERY EVENING  . [DISCONTINUED] FLUoxetine (PROZAC) 20 MG capsule Take 1 capsule (20 mg total) by mouth daily.  . [DISCONTINUED] pravastatin (PRAVACHOL) 10 MG tablet Take 1 tablet (10 mg total) by mouth daily.   No facility-administered encounter medications on file as of 02/09/2016.    Review of Systems  Constitutional: Negative for appetite  change and unexpected weight change.  HENT: Negative for congestion and sinus pressure.   Respiratory: Negative for cough, chest tightness and shortness of breath.   Cardiovascular: Negative for chest pain, palpitations and leg swelling.  Gastrointestinal: Negative for nausea, vomiting, abdominal pain and diarrhea.  Genitourinary: Negative for dysuria and difficulty urinating.  Musculoskeletal: Negative for back pain and joint swelling.  Skin: Negative for color change and rash.  Neurological: Negative for dizziness, light-headedness and headaches.  Psychiatric/Behavioral: Negative for dysphoric mood and agitation.       Objective:    Physical Exam  Constitutional: She appears well-developed and well-nourished. No distress.  HENT:  Nose: Nose normal.  Mouth/Throat: Oropharynx is clear and moist.  Neck: Neck supple. No thyromegaly present.  Cardiovascular: Normal rate and regular rhythm.   Pulmonary/Chest: Breath sounds normal. No respiratory distress. She has no wheezes.  Abdominal: Soft. Bowel sounds are normal. There is no tenderness.  Musculoskeletal: She exhibits no edema or tenderness.  Lymphadenopathy:    She has no cervical adenopathy.  Skin: No rash noted. No erythema.  Psychiatric: She has a normal mood and affect. Her behavior is normal.    BP 124/84 mmHg  Pulse 69  Temp(Src) 98.2 F (36.8 C) (Oral)  Resp 18  Ht 5' 2.75" (1.594 m)  Wt 248 lb (112.492 kg)  BMI 44.27 kg/m2  SpO2 94% Wt Readings from Last 3 Encounters:  02/09/16 248 lb (112.492 kg)  02/02/16 246 lb 1.9 oz (111.639 kg)  11/11/15 246 lb (111.585 kg)     Lab Results  Component Value Date   WBC 5.5 01/30/2015   HGB 13.8 01/30/2015   HCT 40 01/30/2015   PLT 221 01/30/2015   GLUCOSE 96 02/06/2016   CHOL 174 02/07/2016   TRIG 88 02/07/2016   HDL 61 02/07/2016   LDLCALC 95 02/07/2016   ALT 8 02/07/2016   AST 14 02/07/2016   NA 144 02/07/2016   K 4.4 02/07/2016   CL 101 02/06/2016    CREATININE 0.7 02/07/2016   BUN 14 02/07/2016   CO2 25 02/06/2016   TSH 3.36 01/30/2015   HGBA1C 5.6 02/07/2016       Assessment & Plan:   Problem List Items Addressed This Visit    Essential hypertension, benign    Blood pressure under good control.  Continue same medication regimen.  Follow pressures.  Follow metabolic panel.        Relevant Medications   diltiazem (CARDIZEM CD) 120 MG 24 hr capsule   enalapril (VASOTEC) 20 MG tablet   pravastatin (PRAVACHOL) 10 MG tablet  Hyperglycemia    Low carb diet and exercise.  Follow met b and a1c.  a1c 5.6.        PAD (peripheral artery disease) ([place]) - Primary    Followed by vascular surgery.  Has bilateral common iliac artery stenosis.        Relevant Medications   diltiazem (CARDIZEM CD) 120 MG 24 hr capsule   enalapril (VASOTEC) 20 MG tablet   pravastatin (PRAVACHOL) 10 MG tablet   Pure hypercholesterolemia    On pravastatin.  Low cholesterol diet and exercise.  Follow lipid panel and liver function tests.        Relevant Medications   diltiazem (CARDIZEM CD) 120 MG 24 hr capsule   enalapril (VASOTEC) 20 MG tablet   pravastatin (PRAVACHOL) 10 MG tablet   Severe obesity (BMI >= 40) (HCC)    Diet and exercise.        Tobacco abuse    Discussed smoking cessation.  She declines to quit.  Discussed screening CT.  Will notify me when agreeable.            Einar Pheasant, MD

## 2016-02-23 ENCOUNTER — Encounter: Payer: Self-pay | Admitting: Internal Medicine

## 2016-02-23 NOTE — Assessment & Plan Note (Signed)
Discussed smoking cessation.  She declines to quit.  Discussed screening CT.  Will notify me when agreeable.

## 2016-02-23 NOTE — Assessment & Plan Note (Signed)
Blood pressure under good control.  Continue same medication regimen.  Follow pressures.  Follow metabolic panel.   

## 2016-02-23 NOTE — Assessment & Plan Note (Signed)
On pravastatin.  Low cholesterol diet and exercise.  Follow lipid panel and liver function tests.   

## 2016-02-23 NOTE — Assessment & Plan Note (Addendum)
Low carb diet and exercise.  Follow met b and a1c.  a1c 5.6.

## 2016-02-23 NOTE — Assessment & Plan Note (Signed)
Diet and exercise.   

## 2016-02-23 NOTE — Assessment & Plan Note (Signed)
Followed by vascular surgery.  Has bilateral common iliac artery stenosis.  

## 2016-05-18 ENCOUNTER — Telehealth: Payer: Self-pay | Admitting: Internal Medicine

## 2016-05-18 MED ORDER — TRIAMTERENE-HCTZ 37.5-25 MG PO TABS: 1.0000 | ORAL_TABLET | Freq: Every day | ORAL | Status: DC

## 2016-05-18 NOTE — Telephone Encounter (Signed)
Pt called and states that she needs a refill of he rhydrochlorothiazide 37.5-25 mg sent to her home delivery pharmacy. She also wants her lab request form so she can go ahead and get her labs done prior to her July 11 annual visit with dr. Nicki Reaper. Please cb patient.

## 2016-05-18 NOTE — Telephone Encounter (Signed)
Last Labs were on 02/06/16, drawn at [place].  Please advise and order for her to pick up.    Refill completed.

## 2016-05-18 NOTE — Telephone Encounter (Signed)
Order form placed in your box.

## 2016-05-19 NOTE — Telephone Encounter (Signed)
[facility] order placed up front for pick up & pt notified

## 2016-06-10 ENCOUNTER — Other Ambulatory Visit: Payer: Self-pay | Admitting: Internal Medicine

## 2016-06-10 DIAGNOSIS — I10 Essential (primary) hypertension: Secondary | ICD-10-CM | POA: Diagnosis not present

## 2016-06-10 DIAGNOSIS — R739 Hyperglycemia, unspecified: Secondary | ICD-10-CM | POA: Diagnosis not present

## 2016-06-10 DIAGNOSIS — E7800 Pure hypercholesterolemia, unspecified: Secondary | ICD-10-CM | POA: Diagnosis not present

## 2016-06-11 LAB — CBC WITH DIFFERENTIAL/PLATELET
BASOS ABS: 0 10*3/uL (ref 0.0–0.2)
BASOS: 1 %
EOS (ABSOLUTE): 0.2 10*3/uL (ref 0.0–0.4)
Eos: 4 %
Hematocrit: 41.4 % (ref 34.0–46.6)
Hemoglobin: 14.1 g/dL (ref 11.1–15.9)
Immature Grans (Abs): 0 10*3/uL (ref 0.0–0.1)
Immature Granulocytes: 0 %
Lymphocytes Absolute: 1.2 10*3/uL (ref 0.7–3.1)
Lymphs: 24 %
MCH: 29 pg (ref 26.6–33.0)
MCHC: 34.1 g/dL (ref 31.5–35.7)
MCV: 85 fL (ref 79–97)
MONOS ABS: 0.5 10*3/uL (ref 0.1–0.9)
Monocytes: 9 %
NEUTROS ABS: 3.1 10*3/uL (ref 1.4–7.0)
NEUTROS PCT: 62 %
PLATELETS: 247 10*3/uL (ref 150–379)
RBC: 4.86 x10E6/uL (ref 3.77–5.28)
RDW: 13.3 % (ref 12.3–15.4)
WBC: 5 10*3/uL (ref 3.4–10.8)

## 2016-06-11 LAB — HEPATIC FUNCTION PANEL
ALBUMIN: 4.2 g/dL (ref 3.5–4.8)
ALK PHOS: 57 IU/L (ref 39–117)
ALT: 9 IU/L (ref 0–32)
AST: 12 IU/L (ref 0–40)
BILIRUBIN TOTAL: 0.7 mg/dL (ref 0.0–1.2)
BILIRUBIN, DIRECT: 0.17 mg/dL (ref 0.00–0.40)
Total Protein: 6.8 g/dL (ref 6.0–8.5)

## 2016-06-11 LAB — BASIC METABOLIC PANEL
BUN / CREAT RATIO: 18 (ref 12–28)
BUN: 15 mg/dL (ref 8–27)
CHLORIDE: 98 mmol/L (ref 96–106)
CO2: 26 mmol/L (ref 18–29)
Calcium: 9.5 mg/dL (ref 8.7–10.3)
Creatinine, Ser: 0.83 mg/dL (ref 0.57–1.00)
GFR calc non Af Amer: 70 mL/min/{1.73_m2} (ref >59)
GFR, EST AFRICAN AMERICAN: 80 mL/min/{1.73_m2} (ref >59)
GLUCOSE: 96 mg/dL (ref 65–99)
POTASSIUM: 4.1 mmol/L (ref 3.5–5.2)
SODIUM: 141 mmol/L (ref 134–144)

## 2016-06-11 LAB — TSH: TSH: 3.26 u[IU]/mL (ref 0.450–4.500)

## 2016-06-11 LAB — LIPID PANEL W/O CHOL/HDL RATIO
Cholesterol, Total: 187 mg/dL (ref 100–199)
HDL: 60 mg/dL (ref >39)
LDL Calculated: 105 mg/dL — ABNORMAL HIGH (ref 0–99)
Triglycerides: 112 mg/dL (ref 0–149)
VLDL Cholesterol Cal: 22 mg/dL (ref 5–40)

## 2016-06-11 LAB — HGB A1C W/O EAG: Hgb A1c MFr Bld: 5.5 % (ref 4.8–5.6)

## 2016-06-14 ENCOUNTER — Encounter: Payer: Self-pay | Admitting: Internal Medicine

## 2016-06-15 ENCOUNTER — Encounter: Payer: Self-pay | Admitting: Internal Medicine

## 2016-06-15 ENCOUNTER — Ambulatory Visit (INDEPENDENT_AMBULATORY_CARE_PROVIDER_SITE_OTHER): Payer: PPO | Admitting: Internal Medicine

## 2016-06-15 VITALS — BP 150/90 | HR 78 | Temp 98.1°F | Resp 18 | Ht 62.25 in | Wt 248.8 lb

## 2016-06-15 DIAGNOSIS — Z720 Tobacco use: Secondary | ICD-10-CM

## 2016-06-15 DIAGNOSIS — Z1239 Encounter for other screening for malignant neoplasm of breast: Secondary | ICD-10-CM | POA: Diagnosis not present

## 2016-06-15 DIAGNOSIS — R739 Hyperglycemia, unspecified: Secondary | ICD-10-CM

## 2016-06-15 DIAGNOSIS — I739 Peripheral vascular disease, unspecified: Secondary | ICD-10-CM | POA: Diagnosis not present

## 2016-06-15 DIAGNOSIS — Z0000 Encounter for general adult medical examination without abnormal findings: Secondary | ICD-10-CM | POA: Diagnosis not present

## 2016-06-15 DIAGNOSIS — F419 Anxiety disorder, unspecified: Secondary | ICD-10-CM

## 2016-06-15 DIAGNOSIS — E7800 Pure hypercholesterolemia, unspecified: Secondary | ICD-10-CM

## 2016-06-15 DIAGNOSIS — I10 Essential (primary) hypertension: Secondary | ICD-10-CM

## 2016-06-15 NOTE — Progress Notes (Signed)
Patient ID: Danielle Yates, female   DOB: 03-03-1941, 75 y.o.   MRN: [id number]   Subjective:    Patient ID: Danielle Yates, female    DOB: October 20, 1941, 75 y.o.   MRN: [id number]  HPI  Patient with past history of hypercholesterolemia, tobacco abuse and hypertension.  She comes in today to follow up on these issues.   She is still smoking.  Discussed the need to quit.  Discussed treatment options.  She tries to stay active.  Reports no cardiac symptoms with increased activity or exertion.  Breathing overall stable.   Discussed exercise.  Discussed screening CT for lung cancer.  She declines at this time.   No abdominal pain or cramping.  Bowels stable.     Past Medical History  Diagnosis Date  . Arthropathy, unspecified, site unspecified   . Essential hypertension, benign   . External hemorrhoids without mention of complication   . Tobacco use disorder   . Diverticulosis of colon (without mention of hemorrhage)   . Panic disorder without agoraphobia   . Disorder of bone and cartilage, unspecified   . Chronic rhinitis   . Unspecified vitamin D deficiency   . Other abnormal glucose   . Obesity, unspecified   . Pure hypercholesterolemia   . Allergy   . Cataract   . 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 Tonsillectomy and adenoidectomy  1946  . Vaginal hysterectomy  1982    fibroid and endometriosis; one ovary remaining  . Cholecystectomy  1985    with open wound  . Appendectomy  1985  . Removed spot on face      [name]  . Eye surgery  2011    CATARACTS -  BOTH   Family History  Problem Relation Age of Onset  . Hypertension Sister   . Heart disease Brother   . Hypertension Brother   . Hyperlipidemia Brother   . Aneurysm Mother   . Rheumatic fever Mother     as an adult  . Heart disease Father     MI x 2   Social History   Social History  . Marital Status: Widowed    Spouse Name: N/A  .  Number of Children: 0  . Years of Education: college   Occupational History  . 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 Smoking status: Current Every Day Smoker -- 0.50 packs/day for 50 years    Types: Cigarettes  . Smokeless tobacco: Never Used  . Alcohol Use: No  . Drug Use: No  . Sexual Activity: No   Other Topics Concern  . None   Social History Narrative   Lives alone. Widowed after 23 years. Not dating. Has dog. Exercise: Walking; light has not been walking as she used to before her 2 dogs passed away. Tries to walk some. Caffeine: Carbonated beverages, 3 servings/day;Diet coke. 3 a day.      10/02/12  PER PATIENT'S LAVENDER FORM: WALK 5 DAYS/WEEK FOR 30 MINUTES    Outpatient Encounter Prescriptions as of 06/15/2016  Medication Sig  . aspirin EC 81 MG tablet Take 81 mg by mouth daily.  [name] diltiazem (CARDIZEM CD) 120 MG 24 hr capsule Take 1 capsule by mouth  daily  . enalapril (VASOTEC) 20 MG tablet TAKE 1 TABLET EVERY MORNING AND 2 TABLETS EVERY EVENING  . FLUoxetine (  PROZAC) 20 MG capsule Take 1 capsule (20 mg total) by mouth daily.  . fluticasone (FLONASE) 50 MCG/ACT nasal spray Place 2 sprays into both nostrils daily.  . pravastatin (PRAVACHOL) 10 MG tablet Take 1 tablet (10 mg total) by mouth daily.  . Probiotic Product (PROBIOTIC DAILY PO) Take 1 tablet by mouth.  . triamterene-hydrochlorothiazide (MAXZIDE-25) 37.5-25 MG tablet Take 1 tablet by mouth daily.  [name] VITAMIN D, CHOLECALCIFEROL, PO Take by mouth daily.   No facility-administered encounter medications on file as of 06/15/2016.    Review of Systems  Constitutional: Negative for appetite change and unexpected weight change.  HENT: Negative for congestion and sinus pressure.   Eyes: Negative for pain and visual disturbance.  Respiratory: Positive for cough (some minimal cough.  ). Negative for chest tightness and shortness of breath.   Cardiovascular: Negative for chest pain  and palpitations.  Gastrointestinal: Negative for nausea, vomiting, abdominal pain and diarrhea.  Genitourinary: Negative for frequency and difficulty urinating.  Musculoskeletal: Negative for back pain and joint swelling.  Skin: Negative for color change and rash.  Neurological: Negative for dizziness, light-headedness and headaches.  Hematological: Negative for adenopathy. Does not bruise/bleed easily.  Psychiatric/Behavioral: Negative for dysphoric mood and agitation.       Objective:     Blood pressure rechecked by me:  132/80  Physical Exam  Constitutional: She is oriented to person, place, and time. She appears well-developed and well-nourished. No distress.  HENT:  Nose: Nose normal.  Mouth/Throat: Oropharynx is clear and moist.  Eyes: Right eye exhibits no discharge. Left eye exhibits no discharge. No scleral icterus.  Neck: Neck supple. No thyromegaly present.  Cardiovascular: Normal rate and regular rhythm.   Pulmonary/Chest: Breath sounds normal. No accessory muscle usage. No tachypnea. No respiratory distress. She has no decreased breath sounds. She has no wheezes. She has no rhonchi. Right breast exhibits no inverted nipple, no mass, no nipple discharge and no tenderness (no axillary adenopathy). Left breast exhibits no inverted nipple, no mass, no nipple discharge and no tenderness (no axilarry adenopathy).  Abdominal: Soft. Bowel sounds are normal. There is no tenderness.  Musculoskeletal: She exhibits no edema or tenderness.  Lymphadenopathy:    She has no cervical adenopathy.  Neurological: She is alert and oriented to person, place, and time.  Skin: Skin is warm. No rash noted. No erythema.  Psychiatric: She has a normal mood and affect. Her behavior is normal.    BP 150/90 mmHg  Pulse 78  Temp(Src) 98.1 F (36.7 C) (Oral)  Resp 18  Ht 5' 2.25" (1.581 m)  Wt 248 lb 12 oz (112.832 kg)  BMI 45.14 kg/m2  SpO2 97% Wt Readings from Last 3 Encounters:  06/15/16  248 lb 12 oz (112.832 kg)  02/09/16 248 lb (112.492 kg)  02/02/16 246 lb 1.9 oz (111.639 kg)     Lab Results  Component Value Date   WBC 5.0 06/10/2016   HGB 13.8 01/30/2015   HCT 41.4 06/10/2016   PLT 247 06/10/2016   GLUCOSE 96 06/10/2016   CHOL 187 06/10/2016   TRIG 112 06/10/2016   HDL 60 06/10/2016   LDLCALC 105* 06/10/2016   ALT 9 06/10/2016   AST 12 06/10/2016   NA 141 06/10/2016   K 4.1 06/10/2016   CL 98 06/10/2016   CREATININE 0.83 06/10/2016   BUN 15 06/10/2016   CO2 26 06/10/2016   TSH 3.260 06/10/2016   HGBA1C 5.5 06/10/2016       Assessment &  Plan:   Problem List Items Addressed This Visit    Anxiety    Doing well on prozac.        Essential hypertension, benign    Blood pressure on recheck improved.  Follow pressures.  Follow metabolic panel.        Health care maintenance - Primary    Physical today 06/15/16.  Mammogram 06/30/15 - [facility] I.  Schedule f/u mammogram.  Colonoscopy 2012.  Recommended f/u colonoscopy in 10 years.        Hyperglycemia    Low carb diet and exercise.  Follow met b and a1c.        Relevant Orders   Hemoglobin A3E   Basic metabolic panel   PAD (peripheral artery disease) ([place])    Followed by vascular surgery.  Has bilateral common iliac artery stenosis.       Pure hypercholesterolemia    Low cholesterol diet and exercise.  Follow lipid panel and liver function tests.  On pravastatin.        Relevant Orders   Lipid panel   Hepatic function panel   Severe obesity (BMI >= 40) (HCC)    Diet and exercise.        Tobacco abuse    Discussed the need for smoking cessation.  Discussed screening CT chest.  She declines.         Other Visit Diagnoses    Breast cancer screening            Einar Pheasant, MD

## 2016-06-15 NOTE — Progress Notes (Signed)
Pre-visit discussion using our clinic review tool. No additional management support is needed unless otherwise documented below in the visit note.  

## 2016-06-15 NOTE — Assessment & Plan Note (Signed)
Physical today 06/15/16.  Mammogram 06/30/15 - [facility] I.  Schedule f/u mammogram.  Colonoscopy 2012.  Recommended f/u colonoscopy in 10 years.

## 2016-06-21 ENCOUNTER — Encounter: Payer: Self-pay | Admitting: Internal Medicine

## 2016-06-21 NOTE — Assessment & Plan Note (Signed)
Followed by vascular surgery.  Has bilateral common iliac artery stenosis.

## 2016-06-21 NOTE — Assessment & Plan Note (Signed)
Diet and exercise.   

## 2016-06-21 NOTE — Assessment & Plan Note (Signed)
Low cholesterol diet and exercise.  Follow lipid panel and liver function tests.  On pravastatin.   

## 2016-06-21 NOTE — Assessment & Plan Note (Signed)
Blood pressure on recheck improved.  Follow pressures.  Follow metabolic panel.  

## 2016-06-21 NOTE — Assessment & Plan Note (Signed)
Doing well on prozac.   

## 2016-06-21 NOTE — Assessment & Plan Note (Signed)
Low carb diet and exercise.  Follow met b and a1c.

## 2016-06-21 NOTE — Assessment & Plan Note (Signed)
Discussed the need for smoking cessation.  Discussed screening CT chest.  She declines.

## 2016-07-05 DIAGNOSIS — Z1231 Encounter for screening mammogram for malignant neoplasm of breast: Secondary | ICD-10-CM | POA: Diagnosis not present

## 2016-07-05 LAB — HM MAMMOGRAPHY

## 2016-07-07 ENCOUNTER — Encounter: Payer: Self-pay | Admitting: Internal Medicine

## 2016-08-12 ENCOUNTER — Ambulatory Visit (INDEPENDENT_AMBULATORY_CARE_PROVIDER_SITE_OTHER): Payer: PPO | Admitting: Cardiovascular Disease

## 2016-08-12 ENCOUNTER — Encounter: Payer: Self-pay | Admitting: Cardiovascular Disease

## 2016-08-12 VITALS — BP 134/62 | HR 75 | Ht 63.0 in | Wt 247.8 lb

## 2016-08-12 DIAGNOSIS — I10 Essential (primary) hypertension: Secondary | ICD-10-CM | POA: Diagnosis not present

## 2016-08-12 DIAGNOSIS — Z720 Tobacco use: Secondary | ICD-10-CM | POA: Diagnosis not present

## 2016-08-12 DIAGNOSIS — E785 Hyperlipidemia, unspecified: Secondary | ICD-10-CM

## 2016-08-12 DIAGNOSIS — I739 Peripheral vascular disease, unspecified: Secondary | ICD-10-CM

## 2016-08-12 NOTE — Patient Instructions (Signed)
Medication Instructions: Continue same medications.   Labwork: None.   Procedures/Testing: None.   Follow-Up: 1 year with Dr. Chandy Tarman.   Any Additional Special Instructions Will Be Listed Below (If Applicable).     If you need a refill on your cardiac medications before your next appointment, please call your pharmacy.   

## 2016-08-12 NOTE — Progress Notes (Signed)
Cardiology Office Note   Date:  08/12/2016   ID:  Danielle Yates, Danielle Yates [birth year], MRN [id number]  PCP:  Einar Pheasant, MD  Cardiologist:   Kathlyn Sacramento, MD   Chief Complaint  Patient presents with  . Other    1 yr f/u. Meds reviewed verbally with pt.       History of Present Illness: Danielle Yates is a 75 y.o. female who presents for a followup visit regarding refractory  Hypertension and peripheral arterial disease. The patient has no previous cardiac history.  She has family history of coronary artery disease.  She did not tolerate metoprolol due to bradycardia and fatigue. She did not tolerate amlodipine and carvedilol due to headache and dizziness.   She underwent renal duplex ultrasound in 2013 which showed no evidence of renal artery stenosis. Echocardiogram in 2013 showed normal LV systolic function. She was noted to have abdominal bruit. Abdominal aortic ultrasound showed no evidence of aortic aneurysm but there was evidence of significant stenosis affecting bilateral common iliac arteries. The patient has chronic back pain but denies any thigh or calf claudication. ABI was normal bilaterally. She is taking pravastatin for hyperlipidemia. She did not tolerate other statins in the past.  She has been doing reasonably well overall with no chest pain. She has mild exertional dyspnea. She continues to smoke about 15 cigarettes a day.   Past Medical History:  Diagnosis Date  . Allergy   . Arthropathy, unspecified, site unspecified   . Cataract   . Chronic rhinitis   . Disorder of bone and cartilage, unspecified   . Diverticulosis of colon (without mention of hemorrhage)   . Essential hypertension, benign   . External hemorrhoids without mention of complication   . 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 Obesity, unspecified   . Other abnormal glucose   . Panic disorder without agoraphobia   . Pure hypercholesterolemia   . Tobacco use  disorder   . Unspecified vitamin D deficiency     Past Surgical History:  Procedure Laterality Date  . APPENDECTOMY  1985  . CHOLECYSTECTOMY  1985   with open wound  . EYE SURGERY  2011   CATARACTS -  BOTH  . Removed spot on face     [name]  . TONSILLECTOMY AND ADENOIDECTOMY  1946  . VAGINAL HYSTERECTOMY  1982   fibroid and endometriosis; one ovary remaining     Current Outpatient Prescriptions  Medication Sig Dispense Refill  . aspirin EC 81 MG tablet Take 81 mg by mouth daily.    [name] diltiazem (CARDIZEM CD) 120 MG 24 hr capsule Take 1 capsule by mouth  daily 90 capsule 3  . enalapril (VASOTEC) 20 MG tablet TAKE 1 TABLET EVERY MORNING AND 2 TABLETS EVERY EVENING 270 tablet 3  . FLUoxetine (PROZAC) 20 MG capsule Take 1 capsule (20 mg total) by mouth daily. 90 capsule 1  . fluticasone (FLONASE) 50 MCG/ACT nasal spray Place 2 sprays into both nostrils daily. 16 g 6  . pravastatin (PRAVACHOL) 10 MG tablet Take 1 tablet (10 mg total) by mouth daily. 90 tablet 3  . Probiotic Product (PROBIOTIC DAILY PO) Take 1 tablet by mouth.    . triamterene-hydrochlorothiazide (MAXZIDE-25) 37.5-25 MG tablet Take 1 tablet by mouth daily. 90 tablet 1  . VITAMIN D, CHOLECALCIFEROL, PO Take by mouth daily.     No current facility-administered medications for this visit.     Allergies:  Codeine; Crestor [rosuvastatin]; and Nickel    Social History:  The patient  reports that she has been smoking Cigarettes.  She has a 25.00 pack-year smoking history. She has never used smokeless tobacco. She reports that she does not drink alcohol or use drugs.   Family History:  The patient's family history includes Aneurysm in her mother; Heart disease in her brother and father; Hyperlipidemia in her brother; Hypertension in her brother and sister; Rheumatic fever in her mother.    ROS:  Please see the history of present illness.   Otherwise, review of systems are positive for none.   All other systems are  reviewed and negative.    PHYSICAL EXAM: VS:  BP 134/62 (BP Location: Left Arm, Patient Position: Sitting, Cuff Size: Large)   Pulse 75   Ht 5\' 3"  (1.6 m)   Wt 247 lb 12 oz (112.4 kg)   BMI 43.89 kg/m  , BMI Body mass index is 43.89 kg/m. GEN: Well nourished, well developed, in no acute distress  HEENT: normal  Neck: no JVD, carotid bruits, or masses Cardiac: RRR; 2/6 systolic ejection murmur in the aortic area, rubs, or gallops,no edema  Respiratory:  clear to auscultation bilaterally, normal work of breathing GI: soft, nontender, nondistended, + BS MS: no deformity or atrophy  Skin: warm and dry, no rash Neuro:  Strength and sensation are intact Psych: euthymic mood, full affect Dorsalis pedis is normal bilaterally.  EKG:  EKG is ordered today. The ekg ordered today demonstrates normal sinus rhythm with sinus arrhythmia.   Recent Labs: 06/10/2016: ALT 9; BUN 15; Creatinine, Ser 0.83; Platelets 247; Potassium 4.1; Sodium 141; TSH 3.260    Lipid Panel    Component Value Date/Time   CHOL 187 06/10/2016 0758   TRIG 112 06/10/2016 0758   HDL 60 06/10/2016 0758   LDLCALC 105 (H) 06/10/2016 0758      Wt Readings from Last 3 Encounters:  08/12/16 247 lb 12 oz (112.4 kg)  06/15/16 248 lb 12 oz (112.8 kg)  02/09/16 248 lb (112.5 kg)       No flowsheet data found.    ASSESSMENT AND PLAN:  1.  Peripheral arterial disease: The patient has known iliac disease but she does not seem to have claudication. Her distal pulses are palpable. I recommend continuing medical therapy.  2. 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 Previous echocardiogram in 2013 showed normal ejection fraction with no significant valvular abnormalities. Continue to monitor clinically.  3. Essential hypertension: Blood pressure is controlled on current medications.  4. Hyperlipidemia: Currently on low-dose pravastatin due to intolerance to other statins.  5. Tobacco use: I  discussed with her again the importance of smoking cessation.   Disposition:   FU with me in 1 year  Signed,  Kathlyn Sacramento, MD  08/12/2016 5:40 PM    [facility]

## 2016-09-17 ENCOUNTER — Other Ambulatory Visit: Payer: Self-pay

## 2016-09-17 MED ORDER — FLUOXETINE HCL 20 MG PO CAPS: 20.0000 mg | ORAL_CAPSULE | Freq: Every day | ORAL | 1 refills | Status: DC

## 2016-10-14 DIAGNOSIS — E7800 Pure hypercholesterolemia, unspecified: Secondary | ICD-10-CM | POA: Diagnosis not present

## 2016-10-14 DIAGNOSIS — R739 Hyperglycemia, unspecified: Secondary | ICD-10-CM | POA: Diagnosis not present

## 2016-10-15 LAB — HEMOGLOBIN A1C
ESTIMATED AVERAGE GLUCOSE: 111 mg/dL
Hgb A1c MFr Bld: 5.5 % (ref 4.8–5.6)

## 2016-10-15 LAB — BASIC METABOLIC PANEL
BUN/Creatinine Ratio: 19 (ref 12–28)
BUN: 15 mg/dL (ref 8–27)
CO2: 29 mmol/L (ref 18–29)
CREATININE: 0.79 mg/dL (ref 0.57–1.00)
Calcium: 9.6 mg/dL (ref 8.7–10.3)
Chloride: 100 mmol/L (ref 96–106)
GFR calc Af Amer: 85 mL/min/{1.73_m2} (ref >59)
GFR calc non Af Amer: 73 mL/min/{1.73_m2} (ref >59)
GLUCOSE: 102 mg/dL — AB (ref 65–99)
Potassium: 3.9 mmol/L (ref 3.5–5.2)
SODIUM: 144 mmol/L (ref 134–144)

## 2016-10-15 LAB — LIPID PANEL
CHOL/HDL RATIO: 3.1 ratio (ref 0.0–4.4)
Cholesterol, Total: 191 mg/dL (ref 100–199)
HDL: 62 mg/dL (ref >39)
LDL Calculated: 110 mg/dL — ABNORMAL HIGH (ref 0–99)
Triglycerides: 96 mg/dL (ref 0–149)
VLDL Cholesterol Cal: 19 mg/dL (ref 5–40)

## 2016-10-15 LAB — HEPATIC FUNCTION PANEL
ALT: 12 IU/L (ref 0–32)
AST: 15 IU/L (ref 0–40)
Albumin: 4.3 g/dL (ref 3.5–4.8)
Alkaline Phosphatase: 56 IU/L (ref 39–117)
BILIRUBIN TOTAL: 0.7 mg/dL (ref 0.0–1.2)
Bilirubin, Direct: 0.18 mg/dL (ref 0.00–0.40)
Total Protein: 6.7 g/dL (ref 6.0–8.5)

## 2016-10-18 ENCOUNTER — Ambulatory Visit (INDEPENDENT_AMBULATORY_CARE_PROVIDER_SITE_OTHER): Payer: PPO | Admitting: Internal Medicine

## 2016-10-18 ENCOUNTER — Encounter: Payer: Self-pay | Admitting: Internal Medicine

## 2016-10-18 DIAGNOSIS — Z23 Encounter for immunization: Secondary | ICD-10-CM

## 2016-10-18 DIAGNOSIS — R739 Hyperglycemia, unspecified: Secondary | ICD-10-CM | POA: Diagnosis not present

## 2016-10-18 DIAGNOSIS — E7800 Pure hypercholesterolemia, unspecified: Secondary | ICD-10-CM

## 2016-10-18 DIAGNOSIS — F419 Anxiety disorder, unspecified: Secondary | ICD-10-CM

## 2016-10-18 DIAGNOSIS — Z720 Tobacco use: Secondary | ICD-10-CM | POA: Diagnosis not present

## 2016-10-18 DIAGNOSIS — I739 Peripheral vascular disease, unspecified: Secondary | ICD-10-CM

## 2016-10-18 DIAGNOSIS — I10 Essential (primary) hypertension: Secondary | ICD-10-CM

## 2016-10-18 NOTE — Assessment & Plan Note (Signed)
Followed by Dr Fletcher Anon.  Stable.  See note.

## 2016-10-18 NOTE — Assessment & Plan Note (Signed)
Low cholesterol diet and exercise.  Discussed lab results with her.  On pravastatin and tolerating.  Hold on increasing.  Had intolerance with other statins.  Lab Results  Component Value Date   CHOL 191 10/14/2016   HDL 62 10/14/2016   LDLCALC 110 (H) 10/14/2016   TRIG 96 10/14/2016   CHOLHDL 3.1 10/14/2016

## 2016-10-18 NOTE — Assessment & Plan Note (Signed)
Discussed diet and exercise.  Follow.  

## 2016-10-18 NOTE — Assessment & Plan Note (Signed)
Dong well on prozac.

## 2016-10-18 NOTE — Progress Notes (Signed)
Patient ID: Danielle Yates, female   DOB: March 03, 1941, 75 y.o.   MRN: [id number]   Subjective:    Patient ID: Danielle Yates, female    DOB: [birth year], 75 y.o.   MRN: [id number]  HPI  Patient here for a scheduled follow up.  Is followed by Dr Danielle Yates for hypertension and peripheral vascular disease.  Previous aortic ultrasound revealed significant stenosis affecting bilateral common iliac arteries.  ABI normal.  No claudication.  Just recently evaluated.  Felt stable.  Continues to smoke.  Discussed the need to quit.  she declines.  Discussed screening CT.  She will consider.  Desires not to pursue at this time.  She was just seen 08/12/16 by cardiology.  No changes made.  Felt stable.  She has started walking.  No chest pain. Feels her breathing is better.  No acid reflux.  No abdominal pain or cramping.     Past Medical History:  Diagnosis Date  . Allergy   . Arthropathy, unspecified, site unspecified   . Cataract   . Chronic rhinitis   . Disorder of bone and cartilage, unspecified   . Diverticulosis of colon (without mention of hemorrhage)   . Essential hypertension, benign   . External hemorrhoids without mention of complication   . 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 Obesity, unspecified   . Other abnormal glucose   . Panic disorder without agoraphobia   . Pure hypercholesterolemia   . Tobacco use disorder   . Unspecified vitamin D deficiency    Past Surgical History:  Procedure Laterality Date  . APPENDECTOMY  1985  . CHOLECYSTECTOMY  1985   with open wound  . EYE SURGERY  2011   CATARACTS -  BOTH  . Removed spot on face     Danielle Yates  . TONSILLECTOMY AND ADENOIDECTOMY  1946  . VAGINAL HYSTERECTOMY  1982   fibroid and endometriosis; one ovary remaining   Family History  Problem Relation Age of Onset  . Hypertension Sister   . Heart disease Brother   . Hypertension Brother   . Hyperlipidemia Brother   . Aneurysm Mother   . Rheumatic fever  Mother     as an adult  . Heart disease Father     MI x 2   Social History   Social History  . Marital status: Widowed    Spouse name: N/A  . Number of children: 0  . Years of education: college   Occupational History  . 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 Smoking status: Current Every Day Smoker    Packs/day: 0.50    Years: 50.00    Types: Cigarettes  . Smokeless tobacco: Never Used  . Alcohol use No  . Drug use: No  . Sexual activity: No   Other Topics Concern  . None   Social History Narrative   Lives alone. Widowed after 23 years. Not dating. Has dog. Exercise: Walking; light has not been walking as she used to before her 2 dogs passed away. Tries to walk some. Caffeine: Carbonated beverages, 3 servings/day;Diet coke. 3 a day.      10/02/12  PER PATIENT'S LAVENDER FORM: WALK 5 DAYS/WEEK FOR 30 MINUTES    Outpatient Encounter Prescriptions as of 10/18/2016  Medication Sig  . aspirin EC 81 MG tablet Take 81 mg by mouth daily.  [name] diltiazem (CARDIZEM CD) 120 MG 24 hr capsule  Take 1 capsule by mouth  daily  . enalapril (VASOTEC) 20 MG tablet TAKE 1 TABLET EVERY MORNING AND 2 TABLETS EVERY EVENING  . FLUoxetine (PROZAC) 20 MG capsule Take 1 capsule (20 mg total) by mouth daily.  . fluticasone (FLONASE) 50 MCG/ACT nasal spray Place 2 sprays into both nostrils daily.  . pravastatin (PRAVACHOL) 10 MG tablet Take 1 tablet (10 mg total) by mouth daily.  . Probiotic Product (PROBIOTIC DAILY PO) Take 1 tablet by mouth.  . triamterene-hydrochlorothiazide (MAXZIDE-25) 37.5-25 MG tablet Take 1 tablet by mouth daily.  [name] VITAMIN D, CHOLECALCIFEROL, PO Take by mouth daily.   No facility-administered encounter medications on file as of 10/18/2016.     Review of Systems  Constitutional: Negative for appetite change and unexpected weight change.  HENT: Negative for congestion and sinus pressure.   Respiratory: Negative for cough, chest  tightness and shortness of breath.   Cardiovascular: Negative for chest pain, palpitations and leg swelling.  Gastrointestinal: Negative for abdominal pain, diarrhea, nausea and vomiting.  Genitourinary: Negative for difficulty urinating and dysuria.  Musculoskeletal: Negative for back pain and joint swelling.  Skin: Negative for color change and rash.  Neurological: Negative for dizziness, light-headedness and headaches.  Psychiatric/Behavioral: Negative for agitation and dysphoric mood.       Objective:     Blood pressure rechecked by me:  138/78  Physical Exam  Constitutional: She appears well-developed and well-nourished. No distress.  HENT:  Nose: Nose normal.  Mouth/Throat: Oropharynx is clear and moist.  Neck: Neck supple. No thyromegaly present.  Cardiovascular: Normal rate and regular rhythm.   Pulmonary/Chest: Breath sounds normal. No respiratory distress. She has no wheezes.  Abdominal: Soft. Bowel sounds are normal. There is no tenderness.  Musculoskeletal: She exhibits no edema or tenderness.  Lymphadenopathy:    She has no cervical adenopathy.  Skin: No rash noted. No erythema.  Psychiatric: She has a normal mood and affect. Her behavior is normal.    BP (!) 142/70   Pulse 76   Temp 98.1 F (36.7 C) (Oral)   Ht 5' 3" (1.6 m)   Wt 252 lb 12.8 oz (114.7 kg)   SpO2 98%   BMI 44.78 kg/m  Wt Readings from Last 3 Encounters:  10/18/16 252 lb 12.8 oz (114.7 kg)  08/12/16 247 lb 12 oz (112.4 kg)  06/15/16 248 lb 12 oz (112.8 kg)     Lab Results  Component Value Date   WBC 5.0 06/10/2016   HGB 13.8 01/30/2015   HCT 41.4 06/10/2016   PLT 247 06/10/2016   GLUCOSE 102 (H) 10/14/2016   CHOL 191 10/14/2016   TRIG 96 10/14/2016   HDL 62 10/14/2016   LDLCALC 110 (H) 10/14/2016   ALT 12 10/14/2016   AST 15 10/14/2016   NA 144 10/14/2016   K 3.9 10/14/2016   CL 100 10/14/2016   CREATININE 0.79 10/14/2016   BUN 15 10/14/2016   CO2 29 10/14/2016   TSH 3.260  06/10/2016   HGBA1C 5.5 10/14/2016       Assessment & Plan:   Problem List Items Addressed This Visit    Anxiety    Dong well on prozac.        Essential hypertension, benign    Blood pressure is stable.  Continue same medication regimen.  Follow pressures.  Follow metabolic panel.        Relevant Orders   Basic metabolic panel   Hyperglycemia    Low carb diet and exercise.  Follow met b and a1c.   Lab Results  Component Value Date   HGBA1C 5.5 10/14/2016        Relevant Orders   Hemoglobin A1c   PAD (peripheral artery disease) (HCC)    Followed by Dr Danielle Yates.  Stable.  See note.        Pure hypercholesterolemia    Low cholesterol diet and exercise.  Discussed lab results with her.  On pravastatin and tolerating.  Hold on increasing.  Had intolerance with other statins.  Lab Results  Component Value Date   CHOL 191 10/14/2016   HDL 62 10/14/2016   LDLCALC 110 (H) 10/14/2016   TRIG 96 10/14/2016   CHOLHDL 3.1 10/14/2016        Relevant Orders   Hepatic function panel   Lipid panel   Severe obesity (BMI >= 40) (HCC)    Discussed diet and exercise.  Follow.        Tobacco abuse    Discussed the need to stop smoking.  She declines.  Follow.  Discussed screening CT chest.  She will consider.  Will notify me if agrees to proceed with the scan.         Other Visit Diagnoses    Encounter for immunization       Relevant Orders   Flu Vaccine QUAD 36+ mos IM (Completed)       Einar Pheasant, MD

## 2016-10-18 NOTE — Assessment & Plan Note (Signed)
Discussed the need to stop smoking.  She declines.  Follow.  Discussed screening CT chest.  She will consider.  Will notify me if agrees to proceed with the scan.

## 2016-10-18 NOTE — Assessment & Plan Note (Signed)
Blood pressure is stable.  Continue same medication regimen.  Follow pressures.  Follow metabolic panel.

## 2016-10-18 NOTE — Assessment & Plan Note (Signed)
Low carb diet and exercise.  Follow met b and a1c.   Lab Results  Component Value Date   HGBA1C 5.5 10/14/2016

## 2016-10-18 NOTE — Progress Notes (Signed)
Pre visit review using our clinic review tool, if applicable. No additional management support is needed unless otherwise documented below in the visit note. 

## 2016-11-22 DIAGNOSIS — H26493 Other secondary cataract, bilateral: Secondary | ICD-10-CM | POA: Diagnosis not present

## 2016-12-13 ENCOUNTER — Telehealth: Payer: Self-pay | Admitting: Internal Medicine

## 2016-12-13 NOTE — Telephone Encounter (Signed)
Pt lvm stating that her mail order pharmacy has faxed a refill request for her mazide. She states that she only has enough to make it to Wednesday and would like to know if 5-6 pills can be sent to a local pharmacy. She also states that all of her medications do not have any more refills. She did not leave a list that she needed refills for but asks for refills to be sent to her mail order ([facility]) pt cb [phone]

## 2016-12-14 ENCOUNTER — Other Ambulatory Visit: Payer: Self-pay | Admitting: *Deleted

## 2016-12-14 MED ORDER — TRIAMTERENE-HCTZ 37.5-25 MG PO TABS: 1.0000 | ORAL_TABLET | Freq: Every day | ORAL | 0 refills | Status: DC

## 2016-12-14 MED ORDER — TRIAMTERENE-HCTZ 37.5-25 MG PO TABS: 1.0000 | ORAL_TABLET | Freq: Every day | ORAL | 1 refills | Status: DC

## 2016-12-14 NOTE — Progress Notes (Unsigned)
Refills sent as requested

## 2017-02-03 ENCOUNTER — Ambulatory Visit: Payer: PPO

## 2017-02-07 ENCOUNTER — Telehealth: Payer: Self-pay | Admitting: Internal Medicine

## 2017-02-07 NOTE — Telephone Encounter (Signed)
Pt called and is requesting a refill on diltiazem (CARDIZEM CD) 120 MG 24 hr capsule, enalapril (VASOTEC) 20 MG tablet, FLUoxetine (PROZAC) 20 MG capsule, pravastatin (PRAVACHOL) 10 MG tablet, and triamterene-hydrochlorothiazide (MAXZIDE-25) 37.5-25 MG tablet. Please advise, thank you!  [place], [place]  Call pt @ [phone]

## 2017-02-08 NOTE — Telephone Encounter (Signed)
done

## 2017-02-14 DIAGNOSIS — E7800 Pure hypercholesterolemia, unspecified: Secondary | ICD-10-CM | POA: Diagnosis not present

## 2017-02-14 DIAGNOSIS — R739 Hyperglycemia, unspecified: Secondary | ICD-10-CM | POA: Diagnosis not present

## 2017-02-14 DIAGNOSIS — I10 Essential (primary) hypertension: Secondary | ICD-10-CM | POA: Diagnosis not present

## 2017-02-15 ENCOUNTER — Encounter: Payer: Self-pay | Admitting: Internal Medicine

## 2017-02-15 LAB — HEPATIC FUNCTION PANEL
ALBUMIN: 4 g/dL (ref 3.5–4.8)
ALT: 12 IU/L (ref 0–32)
AST: 17 IU/L (ref 0–40)
Alkaline Phosphatase: 53 IU/L (ref 39–117)
Bilirubin Total: 0.6 mg/dL (ref 0.0–1.2)
Bilirubin, Direct: 0.17 mg/dL (ref 0.00–0.40)
TOTAL PROTEIN: 6.4 g/dL (ref 6.0–8.5)

## 2017-02-15 LAB — BASIC METABOLIC PANEL
BUN / CREAT RATIO: 17 (ref 12–28)
BUN: 13 mg/dL (ref 8–27)
CALCIUM: 9.1 mg/dL (ref 8.7–10.3)
CHLORIDE: 100 mmol/L (ref 96–106)
CO2: 28 mmol/L (ref 18–29)
CREATININE: 0.75 mg/dL (ref 0.57–1.00)
GFR calc Af Amer: 90 mL/min/{1.73_m2} (ref >59)
GFR calc non Af Amer: 78 mL/min/{1.73_m2} (ref >59)
GLUCOSE: 100 mg/dL — AB (ref 65–99)
Potassium: 4.2 mmol/L (ref 3.5–5.2)
Sodium: 143 mmol/L (ref 134–144)

## 2017-02-15 LAB — HEMOGLOBIN A1C
ESTIMATED AVERAGE GLUCOSE: 108 mg/dL
Hgb A1c MFr Bld: 5.4 % (ref 4.8–5.6)

## 2017-02-15 LAB — LIPID PANEL
CHOL/HDL RATIO: 2.8 ratio (ref 0.0–4.4)
CHOLESTEROL TOTAL: 162 mg/dL (ref 100–199)
HDL: 58 mg/dL (ref >39)
LDL CALC: 85 mg/dL (ref 0–99)
TRIGLYCERIDES: 94 mg/dL (ref 0–149)
VLDL CHOLESTEROL CAL: 19 mg/dL (ref 5–40)

## 2017-02-16 ENCOUNTER — Ambulatory Visit (INDEPENDENT_AMBULATORY_CARE_PROVIDER_SITE_OTHER): Payer: PPO | Admitting: Internal Medicine

## 2017-02-16 ENCOUNTER — Encounter: Payer: Self-pay | Admitting: Internal Medicine

## 2017-02-16 VITALS — BP 138/78 | HR 80 | Temp 98.6°F | Resp 16 | Ht 63.0 in | Wt 248.6 lb

## 2017-02-16 DIAGNOSIS — R739 Hyperglycemia, unspecified: Secondary | ICD-10-CM | POA: Diagnosis not present

## 2017-02-16 DIAGNOSIS — I739 Peripheral vascular disease, unspecified: Secondary | ICD-10-CM

## 2017-02-16 DIAGNOSIS — I10 Essential (primary) hypertension: Secondary | ICD-10-CM

## 2017-02-16 DIAGNOSIS — Z720 Tobacco use: Secondary | ICD-10-CM | POA: Diagnosis not present

## 2017-02-16 DIAGNOSIS — J329 Chronic sinusitis, unspecified: Secondary | ICD-10-CM | POA: Diagnosis not present

## 2017-02-16 DIAGNOSIS — F419 Anxiety disorder, unspecified: Secondary | ICD-10-CM

## 2017-02-16 MED ORDER — FLUOXETINE HCL 20 MG PO CAPS: 20.0000 mg | ORAL_CAPSULE | Freq: Every day | ORAL | 1 refills | Status: DC

## 2017-02-16 MED ORDER — TRIAMTERENE-HCTZ 37.5-25 MG PO TABS: 1.0000 | ORAL_TABLET | Freq: Every day | ORAL | 1 refills | Status: DC

## 2017-02-16 MED ORDER — DILTIAZEM HCL ER COATED BEADS 120 MG PO CP24: ORAL_CAPSULE | ORAL | 3 refills | Status: DC

## 2017-02-16 MED ORDER — FLUTICASONE PROPIONATE 50 MCG/ACT NA SUSP: 2.0000 | Freq: Every day | NASAL | 3 refills | Status: DC

## 2017-02-16 MED ORDER — AMOXICILLIN 875 MG PO TABS: 875.0000 mg | ORAL_TABLET | Freq: Two times a day (BID) | ORAL | 0 refills | Status: DC

## 2017-02-16 MED ORDER — DILTIAZEM HCL ER COATED BEADS 120 MG PO CP24: ORAL_CAPSULE | ORAL | 0 refills | Status: DC

## 2017-02-16 NOTE — Progress Notes (Addendum)
Patient ID: Danielle Yates, female   DOB: [birth year], 76 y.o.   MRN: [id number]   Subjective:    Patient ID: Danielle Yates, female    DOB: 1941/05/20, 76 y.o.   MRN: [id number]  HPI  Patient here for a scheduled follow up. Followed by Dr Fletcher Anon for hypertension and PVD.  Has been stable.  She continues to smoke.  Discussed the need to quit.  She declines.  Discussed diet and exercise.  She started back on weight watchers two weeks ago.  Plans to get more serious about her diet.  She reports that she has noticed increased cough and congestion.  Started 3-4 weeks ago.  Using saline and flonase.  Some drainage.  Some sinus congestion.  Persistent symptoms.  Trying otc.  Not resolving.  No sob.  No acid reflux.  No abdominal pain.  Bowels stable.    Past Medical History:  Diagnosis Date  . Allergy   . Arthropathy, unspecified, site unspecified   . Cataract   . Chronic rhinitis   . Disorder of bone and cartilage, unspecified   . Diverticulosis of colon (without mention of hemorrhage)   . Essential hypertension, benign   . External hemorrhoids without mention of complication   . 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 Obesity, unspecified   . Other abnormal glucose   . Panic disorder without agoraphobia   . Pure hypercholesterolemia   . Tobacco use disorder   . Unspecified vitamin D deficiency    Past Surgical History:  Procedure Laterality Date  . APPENDECTOMY  1985  . CHOLECYSTECTOMY  1985   with open wound  . EYE SURGERY  2011   CATARACTS -  BOTH  . Removed spot on face     [name]  . TONSILLECTOMY AND ADENOIDECTOMY  1946  . VAGINAL HYSTERECTOMY  1982   fibroid and endometriosis; one ovary remaining   Family History  Problem Relation Age of Onset  . Hypertension Sister   . Heart disease Brother   . Hypertension Brother   . Hyperlipidemia Brother   . Aneurysm Mother   . Rheumatic fever Mother     as an adult  . Heart disease Father     MI x 2    Social History   Social History  . Marital status: Widowed    Spouse name: N/A  . Number of children: 0  . Years of education: college   Occupational History  . 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 Smoking status: Current Every Day Smoker    Packs/day: 0.50    Years: 50.00    Types: Cigarettes  . Smokeless tobacco: Never Used  . Alcohol use No  . Drug use: No  . Sexual activity: No   Other Topics Concern  . None   Social History Narrative   Lives alone. Widowed after 23 years. Not dating. Has dog. Exercise: Walking; light has not been walking as she used to before her 2 dogs passed away. Tries to walk some. Caffeine: Carbonated beverages, 3 servings/day;Diet coke. 3 a day.      10/02/12  PER PATIENT'S LAVENDER FORM: WALK 5 DAYS/WEEK FOR 30 MINUTES    Outpatient Encounter Prescriptions as of 02/16/2017  Medication Sig  . aspirin EC 81 MG tablet Take 81 mg by mouth daily.  [name] diltiazem (CARDIZEM CD) 120 MG 24 hr capsule Take 1 capsule by mouth  daily  . FLUoxetine (PROZAC) 20 MG capsule Take 1 capsule (20 mg total) by mouth daily.  . fluticasone (FLONASE) 50 MCG/ACT nasal spray Place 2 sprays into both nostrils daily.  . pravastatin (PRAVACHOL) 10 MG tablet Take 1 tablet (10 mg total) by mouth daily.  . Probiotic Product (PROBIOTIC DAILY PO) Take 1 tablet by mouth.  . triamterene-hydrochlorothiazide (MAXZIDE-25) 37.5-25 MG tablet Take 1 tablet by mouth daily.  [name] VITAMIN D, CHOLECALCIFEROL, PO Take by mouth daily.  . [DISCONTINUED] diltiazem (CARDIZEM CD) 120 MG 24 hr capsule Take 1 capsule by mouth  daily  . [DISCONTINUED] diltiazem (CARDIZEM CD) 120 MG 24 hr capsule Take 1 capsule by mouth  daily  . [DISCONTINUED] enalapril (VASOTEC) 20 MG tablet TAKE 1 TABLET EVERY MORNING AND 2 TABLETS EVERY EVENING  . [DISCONTINUED] FLUoxetine (PROZAC) 20 MG capsule Take 1 capsule (20 mg total) by mouth daily.  . [DISCONTINUED] fluticasone  (FLONASE) 50 MCG/ACT nasal spray Place 2 sprays into both nostrils daily.  . [DISCONTINUED] triamterene-hydrochlorothiazide (MAXZIDE-25) 37.5-25 MG tablet Take 1 tablet by mouth daily.  [name] amoxicillin (AMOXIL) 875 MG tablet Take 1 tablet (875 mg total) by mouth 2 (two) times daily.   No facility-administered encounter medications on file as of 02/16/2017.     Review of Systems  Constitutional: Negative for appetite change and unexpected weight change.  HENT: Positive for congestion and sinus pressure.   Respiratory: Positive for cough. Negative for chest tightness and shortness of breath.   Cardiovascular: Negative for chest pain, palpitations and leg swelling.  Gastrointestinal: Negative for abdominal pain, diarrhea, nausea and vomiting.  Genitourinary: Negative for difficulty urinating and dysuria.  Musculoskeletal: Negative for back pain and joint swelling.  Skin: Negative for color change and rash.  Neurological: Negative for dizziness, light-headedness and headaches.  Psychiatric/Behavioral: Negative for agitation and dysphoric mood.       Objective:     Blood pressure rechecked by me:  138/78  Physical Exam  Constitutional: She appears well-developed and well-nourished. No distress.  HENT:  Mouth/Throat: Oropharynx is clear and moist.  Nares - erythematous turbinates.  Minimal tenderness to palpation over the sinuses.    Neck: Neck supple. No thyromegaly present.  Cardiovascular: Normal rate and regular rhythm.   Pulmonary/Chest: Breath sounds normal. No respiratory distress. She has no wheezes.  Abdominal: Soft. Bowel sounds are normal. There is no tenderness.  Musculoskeletal: She exhibits no edema or tenderness.  Lymphadenopathy:    She has no cervical adenopathy.  Skin: No rash noted. No erythema.  Psychiatric: She has a normal mood and affect. Her behavior is normal.    BP 138/78   Pulse 80   Temp 98.6 F (37 C) (Oral)   Resp 16   Ht _0  (1.6 m)   Wt 248 lb  9.6 oz (112.8 kg)   SpO2 95%   BMI 44.04 kg/m  Wt Readings from Last 3 Encounters:  02/16/17 248 lb 9.6 oz (112.8 kg)  10/18/16 252 lb 12.8 oz (114.7 kg)  08/12/16 247 lb 12 oz (112.4 kg)     Lab Results  Component Value Date   WBC 5.0 06/10/2016   HGB 13.8 01/30/2015   HCT 41.4 06/10/2016   PLT 247 06/10/2016   GLUCOSE 100 (H) 02/14/2017   CHOL 162 02/14/2017   TRIG 94 02/14/2017   HDL 58 02/14/2017   LDLCALC 85 02/14/2017   ALT 12 02/14/2017   AST 17 02/14/2017   NA 143 02/14/2017   K 4.2 02/14/2017  CL 100 02/14/2017   CREATININE 0.75 02/14/2017   BUN 13 02/14/2017   CO2 28 02/14/2017   TSH 3.260 06/10/2016   HGBA1C 5.4 02/14/2017       Assessment & Plan:   Problem List Items Addressed This Visit    Anxiety    Stable on prozac.  Follow.       Relevant Medications   FLUoxetine (PROZAC) 20 MG capsule   Essential hypertension, benign    Blood pressure under reasonable control.  Same medication regimen.  Follow pressures.  Follow metabolic panel.        Relevant Medications   triamterene-hydrochlorothiazide (MAXZIDE-25) 37.5-25 MG tablet   diltiazem (CARDIZEM CD) 120 MG 24 hr capsule   Other Relevant Orders   Hepatic function panel   Basic metabolic panel   Hyperglycemia    Low carb diet and exercise.  Follow met b and a1c.        Relevant Orders   Hemoglobin A1c   PAD (peripheral artery disease) ([place])    Followed by Dr Fletcher Anon.  Stable.        Relevant Medications   triamterene-hydrochlorothiazide (MAXZIDE-25) 37.5-25 MG tablet   diltiazem (CARDIZEM CD) 120 MG 24 hr capsule   Other Relevant Orders   Lipid panel   Severe obesity (BMI >= 40) (HCC)    She has started back to weight watchers.  Diet and exercise.  Follow.       Tobacco abuse    Discussed the need to quit smoking.  She declines.  Follow.        Other Visit Diagnoses    Sinusitis, unspecified chronicity, unspecified location    -  Primary   persistent symptoms.  amoxicillin as  directed.  saline nasal spray and flonase as directed.  mucines.  follow.     Relevant Medications   fluticasone (FLONASE) 50 MCG/ACT nasal spray   amoxicillin (AMOXIL) 875 MG tablet       Einar Pheasant, MD

## 2017-02-16 NOTE — Patient Instructions (Signed)
Saline nasal spray - flush nose at least 2-3x/day  flonase nasal spray - 2 sprays each nostril one time per day.  Do this in the evening.    mucinex in the am.   Continue your probiotic daily.

## 2017-02-16 NOTE — Progress Notes (Signed)
Pre-visit discussion using our clinic review tool. No additional management support is needed unless otherwise documented below in the visit note.  

## 2017-02-17 ENCOUNTER — Other Ambulatory Visit: Payer: Self-pay

## 2017-02-17 ENCOUNTER — Telehealth: Payer: Self-pay | Admitting: Internal Medicine

## 2017-02-17 MED ORDER — ENALAPRIL MALEATE 20 MG PO TABS: ORAL_TABLET | ORAL | 3 refills | Status: DC

## 2017-02-17 NOTE — Telephone Encounter (Signed)
Pt needs a refill on her enalapril (VASOTEC) 20 MG tablet. Please advise, thank you!  [place], [place]  Call pt @ [phone]

## 2017-02-17 NOTE — Progress Notes (Signed)
I have called pt she states that it has not been received by pharmacy. I called while patient was in the office.  I have re faxed to mail order at this time as well.

## 2017-02-17 NOTE — Telephone Encounter (Signed)
Re-faxed.

## 2017-02-20 ENCOUNTER — Encounter: Payer: Self-pay | Admitting: Internal Medicine

## 2017-02-20 NOTE — Assessment & Plan Note (Signed)
Blood pressure under reasonable control.  Same medication regimen.  Follow pressures.  Follow metabolic panel.   

## 2017-02-20 NOTE — Addendum Note (Signed)
Addended by: Alisa Graff on: 02/20/2017 03:52 PM   Modules accepted: Orders

## 2017-02-20 NOTE — Assessment & Plan Note (Signed)
Followed by Dr Arida.  Stable.   

## 2017-02-20 NOTE — Assessment & Plan Note (Signed)
Low carb diet and exercise.  Follow met b and a1c.   

## 2017-02-20 NOTE — Assessment & Plan Note (Signed)
Discussed the need to quit smoking.  She declines.  Follow.  

## 2017-02-20 NOTE — Assessment & Plan Note (Signed)
Stable on prozac.  Follow.   

## 2017-02-20 NOTE — Assessment & Plan Note (Signed)
She has started back to weight watchers.  Diet and exercise.  Follow.

## 2017-05-31 ENCOUNTER — Telehealth: Payer: Self-pay | Admitting: Internal Medicine

## 2017-05-31 NOTE — Telephone Encounter (Signed)
Pt declined AWV. °

## 2017-06-15 DIAGNOSIS — I739 Peripheral vascular disease, unspecified: Secondary | ICD-10-CM | POA: Diagnosis not present

## 2017-06-15 DIAGNOSIS — I10 Essential (primary) hypertension: Secondary | ICD-10-CM | POA: Diagnosis not present

## 2017-06-15 DIAGNOSIS — R739 Hyperglycemia, unspecified: Secondary | ICD-10-CM | POA: Diagnosis not present

## 2017-06-16 ENCOUNTER — Encounter: Payer: Self-pay | Admitting: Internal Medicine

## 2017-06-16 LAB — HEPATIC FUNCTION PANEL
ALK PHOS: 57 IU/L (ref 39–117)
ALT: 14 IU/L (ref 0–32)
AST: 16 IU/L (ref 0–40)
Albumin: 4.3 g/dL (ref 3.5–4.8)
BILIRUBIN, DIRECT: 0.19 mg/dL (ref 0.00–0.40)
Bilirubin Total: 0.7 mg/dL (ref 0.0–1.2)
TOTAL PROTEIN: 6.8 g/dL (ref 6.0–8.5)

## 2017-06-16 LAB — LIPID PANEL
CHOL/HDL RATIO: 3 ratio (ref 0.0–4.4)
Cholesterol, Total: 176 mg/dL (ref 100–199)
HDL: 59 mg/dL (ref >39)
LDL Calculated: 98 mg/dL (ref 0–99)
Triglycerides: 93 mg/dL (ref 0–149)
VLDL CHOLESTEROL CAL: 19 mg/dL (ref 5–40)

## 2017-06-16 LAB — BASIC METABOLIC PANEL
BUN / CREAT RATIO: 22 (ref 12–28)
BUN: 16 mg/dL (ref 8–27)
CHLORIDE: 98 mmol/L (ref 96–106)
CO2: 26 mmol/L (ref 20–29)
Calcium: 9.7 mg/dL (ref 8.7–10.3)
Creatinine, Ser: 0.72 mg/dL (ref 0.57–1.00)
GFR calc Af Amer: 95 mL/min/{1.73_m2} (ref >59)
GFR calc non Af Amer: 82 mL/min/{1.73_m2} (ref >59)
GLUCOSE: 99 mg/dL (ref 65–99)
Potassium: 4.3 mmol/L (ref 3.5–5.2)
Sodium: 140 mmol/L (ref 134–144)

## 2017-06-16 LAB — HEMOGLOBIN A1C
Est. average glucose Bld gHb Est-mCnc: 111 mg/dL
HEMOGLOBIN A1C: 5.5 % (ref 4.8–5.6)

## 2017-06-17 ENCOUNTER — Encounter: Payer: Self-pay | Admitting: Internal Medicine

## 2017-06-17 ENCOUNTER — Telehealth: Payer: Self-pay | Admitting: Internal Medicine

## 2017-06-17 ENCOUNTER — Ambulatory Visit (INDEPENDENT_AMBULATORY_CARE_PROVIDER_SITE_OTHER): Payer: PPO | Admitting: Internal Medicine

## 2017-06-17 VITALS — BP 128/82 | HR 71 | Temp 98.6°F | Resp 12 | Ht 63.0 in | Wt 248.4 lb

## 2017-06-17 DIAGNOSIS — Z720 Tobacco use: Secondary | ICD-10-CM

## 2017-06-17 DIAGNOSIS — Z1231 Encounter for screening mammogram for malignant neoplasm of breast: Secondary | ICD-10-CM

## 2017-06-17 DIAGNOSIS — R739 Hyperglycemia, unspecified: Secondary | ICD-10-CM | POA: Diagnosis not present

## 2017-06-17 DIAGNOSIS — I10 Essential (primary) hypertension: Secondary | ICD-10-CM | POA: Diagnosis not present

## 2017-06-17 DIAGNOSIS — Z6841 Body Mass Index (BMI) 40.0 and over, adult: Secondary | ICD-10-CM

## 2017-06-17 DIAGNOSIS — E7800 Pure hypercholesterolemia, unspecified: Secondary | ICD-10-CM | POA: Diagnosis not present

## 2017-06-17 DIAGNOSIS — I739 Peripheral vascular disease, unspecified: Secondary | ICD-10-CM

## 2017-06-17 DIAGNOSIS — Z0000 Encounter for general adult medical examination without abnormal findings: Secondary | ICD-10-CM

## 2017-06-17 MED ORDER — ZOSTER VAC RECOMB ADJUVANTED 50 MCG/0.5ML IM SUSR: 0.5000 mL | Freq: Once | INTRAMUSCULAR | 0 refills | Status: AC

## 2017-06-17 NOTE — Telephone Encounter (Signed)
Left message to return call to our office.  

## 2017-06-17 NOTE — Progress Notes (Signed)
Pre-visit discussion using our clinic review tool. No additional management support is needed unless otherwise documented below in the visit note.  

## 2017-06-17 NOTE — Progress Notes (Signed)
Patient ID: Danielle Yates, female   DOB: [birth year], 76 y.o.   MRN: [id number]   Subjective:    Patient ID: Danielle Yates, female    DOB: Mar 08, 1941, 75 y.o.   MRN: [id number]  HPI  Patient with past history of hypertension and PVD.  She comes in today to follow up on these issues as well as for a complete physical exam.   Also followed by Dr Fletcher Anon.  She reports she is doing well.  Breathing is stable.  No chest pain.  No acid reflux.  No abdominal pain.  Bowels moving.  Overall she feels she is doing well.     Past Medical History:  Diagnosis Date  . Allergy   . Arthropathy, unspecified, site unspecified   . Cataract   . Chronic rhinitis   . Disorder of bone and cartilage, unspecified   . Diverticulosis of colon (without mention of hemorrhage)   . Essential hypertension, benign   . External hemorrhoids without mention of complication   . 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 Obesity, unspecified   . Other abnormal glucose   . Panic disorder without agoraphobia   . Pure hypercholesterolemia   . Tobacco use disorder   . Unspecified vitamin D deficiency    Past Surgical History:  Procedure Laterality Date  . APPENDECTOMY  1985  . CHOLECYSTECTOMY  1985   with open wound  . EYE SURGERY  2011   CATARACTS -  BOTH  . Removed spot on face     [name]  . TONSILLECTOMY AND ADENOIDECTOMY  1946  . VAGINAL HYSTERECTOMY  1982   fibroid and endometriosis; one ovary remaining   Family History  Problem Relation Age of Onset  . Hypertension Sister   . Heart disease Brother   . Hypertension Brother   . Hyperlipidemia Brother   . Aneurysm Mother   . Rheumatic fever Mother        as an adult  . Heart disease Father        MI x 2   Social History   Social History  . Marital status: Widowed    Spouse name: N/A  . Number of children: 0  . Years of education: college   Occupational History  . 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 Smoking status: Current Every Day Smoker    Packs/day: 0.50    Years: 50.00    Types: Cigarettes  . Smokeless tobacco: Never Used  . Alcohol use No  . Drug use: No  . Sexual activity: No   Other Topics Concern  . None   Social History Narrative   Lives alone. Widowed after 23 years. Not dating. Has dog. Exercise: Walking; light has not been walking as she used to before her 2 dogs passed away. Tries to walk some. Caffeine: Carbonated beverages, 3 servings/day;Diet coke. 3 a day.      10/02/12  PER PATIENT'S LAVENDER FORM: WALK 5 DAYS/WEEK FOR 30 MINUTES    Outpatient Encounter Prescriptions as of 06/17/2017  Medication Sig  . aspirin EC 81 MG tablet Take 81 mg by mouth daily.  [name] diltiazem (CARDIZEM CD) 120 MG 24 hr capsule Take 1 capsule by mouth  daily  . enalapril (VASOTEC) 20 MG tablet TAKE 1 TABLET EVERY MORNING AND 2 TABLETS EVERY EVENING  . FLUoxetine (PROZAC) 20 MG capsule Take 1 capsule (20 mg total) by  mouth daily.  . fluticasone (FLONASE) 50 MCG/ACT nasal spray Place 2 sprays into both nostrils daily.  . pravastatin (PRAVACHOL) 10 MG tablet Take 1 tablet (10 mg total) by mouth daily.  . Probiotic Product (PROBIOTIC DAILY PO) Take 1 tablet by mouth.  . triamterene-hydrochlorothiazide (MAXZIDE-25) 37.5-25 MG tablet Take 1 tablet by mouth daily.  [name] VITAMIN D, CHOLECALCIFEROL, PO Take by mouth daily.  . [DISCONTINUED] amoxicillin (AMOXIL) 875 MG tablet Take 1 tablet (875 mg total) by mouth 2 (two) times daily.   No facility-administered encounter medications on file as of 06/17/2017.     Review of Systems  Constitutional: Negative for appetite change and unexpected weight change.  HENT: Negative for congestion and sinus pressure.   Eyes: Negative for pain and visual disturbance.  Respiratory: Negative for cough, chest tightness and shortness of breath.   Cardiovascular: Negative for chest pain, palpitations and leg swelling.  Gastrointestinal:  Negative for abdominal pain, diarrhea, nausea and vomiting.  Genitourinary: Negative for difficulty urinating and dysuria.  Musculoskeletal: Negative for joint swelling and myalgias.  Skin: Negative for color change and rash.  Neurological: Negative for dizziness, light-headedness and headaches.  Hematological: Negative for adenopathy. Does not bruise/bleed easily.  Psychiatric/Behavioral: Negative for agitation and dysphoric mood.       Objective:     Blood pressure rechecked by me:  128/82  Physical Exam  Constitutional: She is oriented to person, place, and time. She appears well-developed and well-nourished. No distress.  HENT:  Nose: Nose normal.  Mouth/Throat: Oropharynx is clear and moist.  Eyes: Right eye exhibits no discharge. Left eye exhibits no discharge. No scleral icterus.  Neck: Neck supple. No thyromegaly present.  Cardiovascular: Normal rate and regular rhythm.   Pulmonary/Chest: Breath sounds normal. No accessory muscle usage. No tachypnea. No respiratory distress. She has no decreased breath sounds. She has no wheezes. She has no rhonchi. Right breast exhibits no inverted nipple, no mass, no nipple discharge and no tenderness (no axillary adenopathy). Left breast exhibits no inverted nipple, no mass, no nipple discharge and no tenderness (no axilarry adenopathy).  Abdominal: Soft. Bowel sounds are normal. There is no tenderness.  Musculoskeletal: She exhibits no edema or tenderness.  Lymphadenopathy:    She has no cervical adenopathy.  Neurological: She is alert and oriented to person, place, and time.  Skin: Skin is warm. No rash noted. No erythema.  Psychiatric: She has a normal mood and affect. Her behavior is normal.    BP 128/82   Pulse 71   Temp 98.6 F (37 C) (Oral)   Resp 12   Ht _0  (1.6 m)   Wt 248 lb 6.4 oz (112.7 kg)   SpO2 96%   BMI 44.00 kg/m  Wt Readings from Last 3 Encounters:  06/17/17 248 lb 6.4 oz (112.7 kg)  02/16/17 248 lb 9.6 oz  (112.8 kg)  10/18/16 252 lb 12.8 oz (114.7 kg)     Lab Results  Component Value Date   WBC 5.0 06/10/2016   HGB 14.1 06/10/2016   HCT 41.4 06/10/2016   PLT 247 06/10/2016   GLUCOSE 99 06/15/2017   CHOL 176 06/15/2017   TRIG 93 06/15/2017   HDL 59 06/15/2017   LDLCALC 98 06/15/2017   ALT 14 06/15/2017   AST 16 06/15/2017   NA 140 06/15/2017   K 4.3 06/15/2017   CL 98 06/15/2017   CREATININE 0.72 06/15/2017   BUN 16 06/15/2017   CO2 26 06/15/2017   TSH 3.260 06/10/2016  HGBA1C 5.5 06/15/2017       Assessment & Plan:   Problem List Items Addressed This Visit    BMI 40.0-44.9, adult ([place])    Discussed diet and exercise.  Discussed low cholesterol diet and exercise.        Essential hypertension, benign    Blood pressure under good control.  Continue same medication regimen.  Follow pressures.  Follow metabolic panel.        Relevant Orders   CBC with Differential/Platelet   TSH   Basic metabolic panel   Health care maintenance    Physical today 06/17/17.  Mammogram 07/05/16 - [facility] I.  Scheduled for f/u mammogram.  Colonoscopy 2012.  Recommended f/u colonoscopy in 10 years.        Hyperglycemia    Low carb diet and exercise.  Follow met b and a1c.        Relevant Orders   Hemoglobin A1c   PAD (peripheral artery disease) ([place])    Followed by Dr Fletcher Anon.  Stable.        Pure hypercholesterolemia    On pravastatin.  Low cholesterol diet and exercise.  Follow lipidpanel and liver function tests.        Relevant Orders   Hepatic function panel   Lipid panel   Tobacco abuse    Discussed the need to quit smoking.  She declines to quit.  Discussed screening CT.  She declines at this time.  Will notify me if she changes her mind.  Discussed importance of early detection.         Other Visit Diagnoses    Visit for screening mammogram    -  Primary   Relevant Orders   MM DIGITAL SCREENING BILATERAL       Einar Pheasant, MD

## 2017-06-17 NOTE — Telephone Encounter (Signed)
rx printed and signed and placed in box.

## 2017-06-17 NOTE — Telephone Encounter (Signed)
Patient also requested script for shingrix. Need to let her know when we call her back that she has mammogram app at [facility] 07/08/17 arrive at 1:30

## 2017-06-17 NOTE — Telephone Encounter (Signed)
FYI - Pt would like her lab orders sent to [place].

## 2017-06-19 ENCOUNTER — Encounter: Payer: Self-pay | Admitting: Internal Medicine

## 2017-06-19 NOTE — Assessment & Plan Note (Signed)
Low carb diet and exercise.  Follow met b and a1c.   

## 2017-06-19 NOTE — Assessment & Plan Note (Signed)
Blood pressure under good control.  Continue same medication regimen.  Follow pressures.  Follow metabolic panel.   

## 2017-06-19 NOTE — Assessment & Plan Note (Signed)
Followed by Dr Arida.  Stable.   

## 2017-06-19 NOTE — Assessment & Plan Note (Signed)
Physical today 06/17/17.  Mammogram 07/05/16 - [facility] I.  Scheduled for f/u mammogram.  Colonoscopy 2012.  Recommended f/u colonoscopy in 10 years.

## 2017-06-19 NOTE — Assessment & Plan Note (Signed)
On pravastatin.  Low cholesterol diet and exercise.  Follow lipid panel and liver function tests.   

## 2017-06-19 NOTE — Assessment & Plan Note (Signed)
Discussed diet and exercise.  Discussed low cholesterol diet and exercise.

## 2017-06-19 NOTE — Assessment & Plan Note (Signed)
Discussed the need to quit smoking.  She declines to quit.  Discussed screening CT.  She declines at this time.  Will notify me if she changes her mind.  Discussed importance of early detection.

## 2017-06-20 NOTE — Telephone Encounter (Signed)
Called pt gave information on the phone as well as sending in mail with script for immunization.

## 2017-07-08 DIAGNOSIS — Z9289 Personal history of other medical treatment: Secondary | ICD-10-CM | POA: Diagnosis not present

## 2017-07-08 DIAGNOSIS — Z1231 Encounter for screening mammogram for malignant neoplasm of breast: Secondary | ICD-10-CM | POA: Diagnosis not present

## 2017-07-08 LAB — HM MAMMOGRAPHY

## 2017-08-18 ENCOUNTER — Encounter: Payer: Self-pay | Admitting: Cardiovascular Disease

## 2017-08-18 ENCOUNTER — Ambulatory Visit (INDEPENDENT_AMBULATORY_CARE_PROVIDER_SITE_OTHER): Payer: PPO | Admitting: Cardiovascular Disease

## 2017-08-18 VITALS — BP 160/98 | HR 83 | Ht 63.0 in | Wt 248.0 lb

## 2017-08-18 DIAGNOSIS — Z720 Tobacco use: Secondary | ICD-10-CM

## 2017-08-18 DIAGNOSIS — R011 Cardiac murmur, unspecified: Secondary | ICD-10-CM | POA: Diagnosis not present

## 2017-08-18 DIAGNOSIS — I739 Peripheral vascular disease, unspecified: Secondary | ICD-10-CM | POA: Diagnosis not present

## 2017-08-18 DIAGNOSIS — I10 Essential (primary) hypertension: Secondary | ICD-10-CM

## 2017-08-18 DIAGNOSIS — E785 Hyperlipidemia, unspecified: Secondary | ICD-10-CM

## 2017-08-18 NOTE — Patient Instructions (Signed)
Medication Instructions:  Your physician recommends that you continue on your current medications as directed. Please refer to the Current Medication list given to you today.   Labwork: none  Testing/Procedures: Your physician has requested that you have an echocardiogram. Echocardiography is a painless test that uses sound waves to create images of your heart. It provides your doctor with information about the size and shape of your heart and how well your heart's chambers and valves are working. This procedure takes approximately one hour. There are no restrictions for this procedure.    Follow-Up: Your physician wants you to follow-up in: one year with Dr. Arida.  You will receive a reminder letter in the mail two months in advance. If you don't receive a letter, please call our office to schedule the follow-up appointment.   Any Other Special Instructions Will Be Listed Below (If Applicable).     If you need a refill on your cardiac medications before your next appointment, please call your pharmacy.   

## 2017-08-18 NOTE — Progress Notes (Signed)
Cardiology Office Note   Date:  08/18/2017   ID:  Danielle, Yates 11-25-1941, MRN [id number]  PCP:  Einar Pheasant, MD  Cardiologist:   Kathlyn Sacramento, MD   Chief Complaint  Patient presents with  . other    12 month follow up. "doing well."       History of Present Illness: Danielle Yates is a 76 y.o. female who presents for a followup visit regarding refractory hypertension and peripheral arterial disease. The patient has no previous cardiac history.  She has family history of coronary artery disease.  She did not tolerate metoprolol due to bradycardia and fatigue. She did not tolerate amlodipine and carvedilol due to headache and dizziness.   She underwent renal duplex ultrasound in 2013 which showed no evidence of renal artery stenosis. Echocardiogram in 2013 showed normal LV systolic function.  She is known to have bilateral common iliac artery disease with peak velocity around 300 with normal ABI bilaterally. She is being treated medically due to lack of significant claudication. She is taking pravastatin for hyperlipidemia. She did not tolerate other statins in the past.  She has been doing reasonably well with no chest pain, shortness of breath or palpitations. Unfortunately, she continues to smoke and not able to quit. Blood pressure is elevated today but her blood pressure has been running in the normal range recently. She reports rushing to the appointment today.  Past Medical History:  Diagnosis Date  . Allergy   . Arthropathy, unspecified, site unspecified   . Cataract   . Chronic rhinitis   . Disorder of bone and cartilage, unspecified   . Diverticulosis of colon (without mention of hemorrhage)   . Essential hypertension, benign   . External hemorrhoids without mention of complication   . 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 Obesity, unspecified   . Other abnormal glucose   . Panic disorder without agoraphobia   . Pure  hypercholesterolemia   . Tobacco use disorder   . Unspecified vitamin D deficiency     Past Surgical History:  Procedure Laterality Date  . APPENDECTOMY  1985  . CHOLECYSTECTOMY  1985   with open wound  . EYE SURGERY  2011   CATARACTS -  BOTH  . Removed spot on face     [name]  . TONSILLECTOMY AND ADENOIDECTOMY  1946  . VAGINAL HYSTERECTOMY  1982   fibroid and endometriosis; one ovary remaining     Current Outpatient Prescriptions  Medication Sig Dispense Refill  . aspirin EC 81 MG tablet Take 81 mg by mouth daily.    [name] diltiazem (CARDIZEM CD) 120 MG 24 hr capsule Take 1 capsule by mouth  daily 90 capsule 3  . enalapril (VASOTEC) 20 MG tablet TAKE 1 TABLET EVERY MORNING AND 2 TABLETS EVERY EVENING 270 tablet 3  . FLUoxetine (PROZAC) 20 MG capsule Take 1 capsule (20 mg total) by mouth daily. 90 capsule 1  . fluticasone (FLONASE) 50 MCG/ACT nasal spray Place 2 sprays into both nostrils daily. 16 g 3  . pravastatin (PRAVACHOL) 10 MG tablet Take 1 tablet (10 mg total) by mouth daily. 90 tablet 3  . Probiotic Product (PROBIOTIC DAILY PO) Take 1 tablet by mouth.    . triamterene-hydrochlorothiazide (MAXZIDE-25) 37.5-25 MG tablet Take 1 tablet by mouth daily. 90 tablet 1  . VITAMIN D, CHOLECALCIFEROL, PO Take by mouth daily.     No current facility-administered medications for this visit.  Allergies:   Codeine; Crestor [rosuvastatin]; and Nickel    Social History:  The patient  reports that she has been smoking Cigarettes.  She has a 25.00 pack-year smoking history. She has never used smokeless tobacco. She reports that she does not drink alcohol or use drugs.   Family History:  The patient's family history includes Aneurysm in her mother; Heart disease in her brother and father; Hyperlipidemia in her brother; Hypertension in her brother and sister; Rheumatic fever in her mother.    ROS:  Please see the history of present illness.   Otherwise, review of systems are positive for  none.   All other systems are reviewed and negative.    PHYSICAL EXAM: VS:  BP (!) 160/98 (BP Location: Left Arm, Patient Position: Sitting, Cuff Size: Large)   Pulse 83   Ht 5\' 3"  (1.6 m)   Wt 248 lb (112.5 kg)   BMI 43.93 kg/m  , BMI Body mass index is 43.93 kg/m. GEN: Well nourished, well developed, in no acute distress  HEENT: normal  Neck: no JVD, carotid bruits, or masses Cardiac: RRR; 2/6 systolic ejection murmur in the aortic area. No rubs, gallops or edema  Respiratory:  clear to auscultation bilaterally, normal work of breathing GI: soft, nontender, nondistended, + BS MS: no deformity or atrophy  Skin: warm and dry, no rash Neuro:  Strength and sensation are intact Psych: euthymic mood, full affect Dorsalis pedis is normal bilaterally.  EKG:  EKG is ordered today. The ekg ordered today demonstrates normal sinus rhythm with no significant ST or T wave changes.   Recent Labs: 06/15/2017: ALT 14; BUN 16; Creatinine, Ser 0.72; Potassium 4.3; Sodium 140    Lipid Panel    Component Value Date/Time   CHOL 176 06/15/2017 0803   TRIG 93 06/15/2017 0803   HDL 59 06/15/2017 0803   CHOLHDL 3.0 06/15/2017 0803   LDLCALC 98 06/15/2017 0803      Wt Readings from Last 3 Encounters:  08/18/17 248 lb (112.5 kg)  06/17/17 248 lb 6.4 oz (112.7 kg)  02/16/17 248 lb 9.6 oz (112.8 kg)       No flowsheet data found.    ASSESSMENT AND PLAN:  1.  Peripheral arterial disease: Mainly with disease affecting the common iliac arteries bilaterally. The patient has palpable distal pulses and has no significant claudication at the present time. Recommend continuing medical therapy.  2. Cardiac murmur in the aortic area suggestive of aortic sclerosis or mild stenosis.  I requested a follow-up echocardiogram  3. Essential hypertension: Blood pressure is elevated but her recent blood pressure has been controlled. Continue to monitor for now.  4. Hyperlipidemia: Currently on low-dose  pravastatin due to intolerance to other statins.  5. Tobacco use: I discussed with her again the importance of smoking cessation.   Disposition:   FU with me in 1 year  Signed,  Kathlyn Sacramento, MD  08/18/2017 10:55 AM    [place]

## 2017-08-24 ENCOUNTER — Ambulatory Visit (INDEPENDENT_AMBULATORY_CARE_PROVIDER_SITE_OTHER): Payer: PPO

## 2017-08-24 ENCOUNTER — Other Ambulatory Visit: Payer: Self-pay

## 2017-08-24 DIAGNOSIS — R011 Cardiac murmur, unspecified: Secondary | ICD-10-CM | POA: Diagnosis not present

## 2017-10-03 ENCOUNTER — Other Ambulatory Visit: Payer: Self-pay | Admitting: Internal Medicine

## 2017-10-14 ENCOUNTER — Telehealth: Payer: Self-pay | Admitting: Internal Medicine

## 2017-10-14 NOTE — Telephone Encounter (Signed)
Copied from [facility] #[id number]. Topic: Quick Communication - See Telephone Encounter >> Oct 14, 2017 10:26 AM Ahmed Prima L wrote: CRM for notification. See Telephone encounter for:  Patient called and said [facility] received a fax from [place] with her name on it but nothing ordered for her to have labs before her CPE and that is for 11/14. She is wanting to come in on Monday to do the labs. Could someone refax the order for her & call to let her know that its ok to come in for the labs monday  10/14/17.

## 2017-10-14 NOTE — Telephone Encounter (Signed)
Please advise 

## 2017-10-14 NOTE — Addendum Note (Signed)
Addended by: Francella Solian on: 10/14/2017 03:44 PM   Modules accepted: Orders

## 2017-10-14 NOTE — Telephone Encounter (Signed)
I have changed order and l/m for patient to let her know.

## 2017-10-17 ENCOUNTER — Other Ambulatory Visit: Payer: Self-pay | Admitting: Internal Medicine

## 2017-10-17 ENCOUNTER — Telehealth: Payer: Self-pay

## 2017-10-17 DIAGNOSIS — E785 Hyperlipidemia, unspecified: Principal | ICD-10-CM

## 2017-10-17 DIAGNOSIS — I10 Essential (primary) hypertension: Secondary | ICD-10-CM | POA: Diagnosis not present

## 2017-10-17 DIAGNOSIS — E1169 Type 2 diabetes mellitus with other specified complication: Secondary | ICD-10-CM

## 2017-10-17 DIAGNOSIS — E7800 Pure hypercholesterolemia, unspecified: Secondary | ICD-10-CM | POA: Diagnosis not present

## 2017-10-17 DIAGNOSIS — R739 Hyperglycemia, unspecified: Secondary | ICD-10-CM | POA: Diagnosis not present

## 2017-10-18 LAB — HEPATIC FUNCTION PANEL
ALBUMIN: 4.4 g/dL (ref 3.5–4.8)
ALT: 12 IU/L (ref 0–32)
AST: 15 IU/L (ref 0–40)
Alkaline Phosphatase: 56 IU/L (ref 39–117)
BILIRUBIN TOTAL: 0.9 mg/dL (ref 0.0–1.2)
BILIRUBIN, DIRECT: 0.2 mg/dL (ref 0.00–0.40)
Total Protein: 6.8 g/dL (ref 6.0–8.5)

## 2017-10-18 LAB — BASIC METABOLIC PANEL
BUN/Creatinine Ratio: 19 (ref 12–28)
BUN: 14 mg/dL (ref 8–27)
CALCIUM: 9.5 mg/dL (ref 8.7–10.3)
CHLORIDE: 98 mmol/L (ref 96–106)
CO2: 28 mmol/L (ref 20–29)
Creatinine, Ser: 0.73 mg/dL (ref 0.57–1.00)
GFR calc Af Amer: 93 mL/min/{1.73_m2} (ref >59)
GFR calc non Af Amer: 80 mL/min/{1.73_m2} (ref >59)
Glucose: 102 mg/dL — ABNORMAL HIGH (ref 65–99)
POTASSIUM: 4.3 mmol/L (ref 3.5–5.2)
SODIUM: 141 mmol/L (ref 134–144)

## 2017-10-18 LAB — CBC WITH DIFFERENTIAL/PLATELET
Basophils Absolute: 0.1 10*3/uL (ref 0.0–0.2)
Basos: 1 %
EOS (ABSOLUTE): 0.2 10*3/uL (ref 0.0–0.4)
Eos: 3 %
Hematocrit: 41.4 % (ref 34.0–46.6)
Hemoglobin: 14 g/dL (ref 11.1–15.9)
IMMATURE GRANULOCYTES: 0 %
Immature Grans (Abs): 0 10*3/uL (ref 0.0–0.1)
LYMPHS ABS: 1.5 10*3/uL (ref 0.7–3.1)
Lymphs: 24 %
MCH: 29.1 pg (ref 26.6–33.0)
MCHC: 33.8 g/dL (ref 31.5–35.7)
MCV: 86 fL (ref 79–97)
MONOS ABS: 0.6 10*3/uL (ref 0.1–0.9)
Monocytes: 11 %
NEUTROS PCT: 61 %
Neutrophils Absolute: 3.8 10*3/uL (ref 1.4–7.0)
PLATELETS: 244 10*3/uL (ref 150–379)
RBC: 4.81 x10E6/uL (ref 3.77–5.28)
RDW: 12.9 % (ref 12.3–15.4)
WBC: 6.1 10*3/uL (ref 3.4–10.8)

## 2017-10-18 LAB — LIPID PANEL
CHOL/HDL RATIO: 2.7 ratio (ref 0.0–4.4)
Cholesterol, Total: 163 mg/dL (ref 100–199)
HDL: 60 mg/dL (ref >39)
LDL Calculated: 86 mg/dL (ref 0–99)
TRIGLYCERIDES: 86 mg/dL (ref 0–149)
VLDL Cholesterol Cal: 17 mg/dL (ref 5–40)

## 2017-10-18 LAB — HEMOGLOBIN A1C
Est. average glucose Bld gHb Est-mCnc: 120 mg/dL
Hgb A1c MFr Bld: 5.8 % — ABNORMAL HIGH (ref 4.8–5.6)

## 2017-10-18 LAB — TSH: TSH: 2.37 u[IU]/mL (ref 0.450–4.500)

## 2017-10-19 ENCOUNTER — Ambulatory Visit: Payer: PPO | Admitting: Internal Medicine

## 2017-10-19 ENCOUNTER — Encounter: Payer: Self-pay | Admitting: Internal Medicine

## 2017-10-19 VITALS — BP 146/90 | HR 72 | Temp 98.6°F | Ht 63.0 in | Wt 250.6 lb

## 2017-10-19 DIAGNOSIS — I499 Cardiac arrhythmia, unspecified: Secondary | ICD-10-CM | POA: Diagnosis not present

## 2017-10-19 DIAGNOSIS — Z720 Tobacco use: Secondary | ICD-10-CM

## 2017-10-19 DIAGNOSIS — J069 Acute upper respiratory infection, unspecified: Secondary | ICD-10-CM

## 2017-10-19 DIAGNOSIS — I4891 Unspecified atrial fibrillation: Secondary | ICD-10-CM

## 2017-10-19 DIAGNOSIS — R739 Hyperglycemia, unspecified: Secondary | ICD-10-CM

## 2017-10-19 DIAGNOSIS — I10 Essential (primary) hypertension: Secondary | ICD-10-CM | POA: Diagnosis not present

## 2017-10-19 DIAGNOSIS — Z6841 Body Mass Index (BMI) 40.0 and over, adult: Secondary | ICD-10-CM | POA: Diagnosis not present

## 2017-10-19 DIAGNOSIS — Z23 Encounter for immunization: Secondary | ICD-10-CM

## 2017-10-19 DIAGNOSIS — E7800 Pure hypercholesterolemia, unspecified: Secondary | ICD-10-CM | POA: Diagnosis not present

## 2017-10-19 DIAGNOSIS — F419 Anxiety disorder, unspecified: Secondary | ICD-10-CM

## 2017-10-19 DIAGNOSIS — I739 Peripheral vascular disease, unspecified: Secondary | ICD-10-CM

## 2017-10-19 MED ORDER — APIXABAN 5 MG PO TABS: 5.0000 mg | ORAL_TABLET | Freq: Two times a day (BID) | ORAL | 1 refills | Status: DC

## 2017-10-19 MED ORDER — LEVALBUTEROL TARTRATE 45 MCG/ACT IN AERO: 2.0000 | INHALATION_SPRAY | Freq: Four times a day (QID) | RESPIRATORY_TRACT | 1 refills | Status: DC | PRN

## 2017-10-19 NOTE — Progress Notes (Signed)
Patient ID: Danielle Yates, female   DOB: 1941/09/30, 76 y.o.   MRN: [id number]   Subjective:    Patient ID: Danielle Yates, female    DOB: 28-Jun-1941, 76 y.o.   MRN: [id number]  HPI  Patient here for a scheduled followup.  She reports she is doing relatively well.  Had some increased congestion and cough recently.  Started taking mucinex.  Symptoms have improved.  No fever.  No sinus pressure.  No chest congestion.  Still with some cough, but improved.  No chest pain.  No increased sob.  No acid reflux.  No abdominal pain.  Bowels moving. Saw Dr Fletcher Anon 08/18/17.  Had echo.  ECHO - normal EF with mildly calcified aortic valve without significant stenosis.   Reports no increased heart rate or palpitations.     Past Medical History:  Diagnosis Date  . Allergy   . Arthropathy, unspecified, site unspecified   . Cataract   . Chronic rhinitis   . Disorder of bone and cartilage, unspecified   . Diverticulosis of colon (without mention of hemorrhage)   . Essential hypertension, benign   . External hemorrhoids without mention of complication   . 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 Obesity, unspecified   . Other abnormal glucose   . Panic disorder without agoraphobia   . Pure hypercholesterolemia   . Tobacco use disorder   . Unspecified vitamin D deficiency    Past Surgical History:  Procedure Laterality Date  . APPENDECTOMY  1985  . CHOLECYSTECTOMY  1985   with open wound  . EYE SURGERY  2011   CATARACTS -  BOTH  . Removed spot on face     [name]  . TONSILLECTOMY AND ADENOIDECTOMY  1946  . VAGINAL HYSTERECTOMY  1982   fibroid and endometriosis; one ovary remaining   Family History  Problem Relation Age of Onset  . Hypertension Sister   . Heart disease Brother   . Hypertension Brother   . Hyperlipidemia Brother   . Aneurysm Mother   . Rheumatic fever Mother        as an adult  . Heart disease Father        MI x 2   Social History   Socioeconomic  History  . Marital status: Widowed    Spouse name: None  . Number of children: 0  . Years of education: college  . Highest education level: None  Social Needs  . Financial resource strain: None  . Food insecurity - worry: None  . Food insecurity - inability: None  . Transportation needs - medical: None  . Transportation needs - non-medical: None  Occupational History  . Occupation: Estate manager/land agent    Comment: [employer] x 20 years  Tobacco Use  . Smoking status: Current Every Day Smoker    Packs/day: 0.50    Years: 50.00    Pack years: 25.00    Types: Cigarettes  . Smokeless tobacco: Never Used  Substance and Sexual Activity  . Alcohol use: No    Alcohol/week: 0.0 oz  . Drug use: No  . Sexual activity: No  Other Topics Concern  . None  Social History Narrative   Lives alone. Widowed after 23 years. Not dating. Has dog. Exercise: Walking; light has not been walking as she used to before her 2 dogs passed away. Tries to walk some. Caffeine: Carbonated beverages, 3 servings/day;Diet coke. 3 a day.      10/02/12  PER PATIENT'S LAVENDER FORM: WALK 5 DAYS/WEEK FOR 30 MINUTES    Outpatient Encounter Medications as of 10/19/2017  Medication Sig  . aspirin EC 81 MG tablet Take 81 mg by mouth daily.  [name] diltiazem (CARDIZEM CD) 120 MG 24 hr capsule Take 1 capsule by mouth  daily  . enalapril (VASOTEC) 20 MG tablet TAKE 1 TABLET EVERY MORNING AND 2 TABLETS EVERY EVENING  . FLUoxetine (PROZAC) 20 MG capsule Take 1 capsule by mouth daily  . fluticasone (FLONASE) 50 MCG/ACT nasal spray Place 2 sprays into both nostrils daily.  . pravastatin (PRAVACHOL) 10 MG tablet Take 1 tablet (10 mg total) by mouth daily.  . Probiotic Product (PROBIOTIC DAILY PO) Take 1 tablet by mouth.  . triamterene-hydrochlorothiazide (MAXZIDE-25) 37.5-25 MG tablet Take 1 tablet by mouth daily.  [name] VITAMIN D, CHOLECALCIFEROL, PO Take by mouth daily.  [name] apixaban (ELIQUIS) 5 MG TABS tablet Take 1 tablet (5 mg  total) 2 (two) times daily by mouth.  . levalbuterol (XOPENEX HFA) 45 MCG/ACT inhaler Inhale 2 puffs every 6 (six) hours as needed into the lungs for wheezing.   No facility-administered encounter medications on file as of 10/19/2017.     Review of Systems  Constitutional: Negative for appetite change and unexpected weight change.  HENT: Negative for congestion and sinus pressure.   Respiratory: Negative for cough, chest tightness and shortness of breath.   Cardiovascular: Negative for chest pain, palpitations and leg swelling.  Gastrointestinal: Negative for abdominal pain, diarrhea, nausea and vomiting.  Genitourinary: Negative for difficulty urinating and dysuria.  Musculoskeletal: Negative for back pain and joint swelling.  Skin: Negative for color change and rash.  Neurological: Negative for dizziness, light-headedness and headaches.  Psychiatric/Behavioral: Negative for agitation and dysphoric mood.       Objective:    Physical Exam  Constitutional: She appears well-developed and well-nourished. No distress.  HENT:  Nose: Nose normal.  Mouth/Throat: Oropharynx is clear and moist.  Neck: Neck supple. No thyromegaly present.  Cardiovascular: Normal rate.  Irregularly irregular.  Pulse rate rechecked:  100  Pulmonary/Chest: Breath sounds normal. No respiratory distress. She has no wheezes.  Abdominal: Soft. Bowel sounds are normal. There is no tenderness.  Musculoskeletal: She exhibits no edema or tenderness.  Lymphadenopathy:    She has no cervical adenopathy.  Skin: No rash noted. No erythema.  Psychiatric: She has a normal mood and affect. Her behavior is normal.    BP (!) 146/90   Pulse 72   Temp 98.6 F (37 C) (Oral)   Ht 5' 3"  (1.6 m)   Wt 250 lb 9.6 oz (113.7 kg)   SpO2 95%   BMI 44.39 kg/m  Wt Readings from Last 3 Encounters:  10/19/17 250 lb 9.6 oz (113.7 kg)  08/18/17 248 lb (112.5 kg)  06/17/17 248 lb 6.4 oz (112.7 kg)     Lab Results  Component  Value Date   WBC 6.1 10/17/2017   HGB 14.0 10/17/2017   HCT 41.4 10/17/2017   PLT 244 10/17/2017   GLUCOSE 102 (H) 10/17/2017   CHOL 163 10/17/2017   TRIG 86 10/17/2017   HDL 60 10/17/2017   LDLCALC 86 10/17/2017   ALT 12 10/17/2017   AST 15 10/17/2017   NA 141 10/17/2017   K 4.3 10/17/2017   CL 98 10/17/2017   CREATININE 0.73 10/17/2017   BUN 14 10/17/2017   CO2 28 10/17/2017   TSH 2.370 10/17/2017   HGBA1C 5.8 (H) 10/17/2017  Assessment & Plan:   Problem List Items Addressed This Visit    Anxiety    Stable on prozac.        Atrial fibrillation ([place])    Noted irregular rhythm on exam.  EKG - Afib with ventricular rate 110.  Recheck pulse rate on exam 100.  On cardizem.  May need to adjust dose.  Hold on changing.  Compete treatment for respiratory issues.  Discussed diagnosis of afib.  Just had echo as outlined.  Refer back to cardiology for evaluation.  Discussed starting eliquis.  She is in agreement.  Recent tsh and metabolic panel wnl.        Relevant Medications   apixaban (ELIQUIS) 5 MG TABS tablet   BMI 40.0-44.9, adult (HCC)    Discussed diet and exercise.  Follow.        Essential hypertension, benign    Blood pressure elevated today.  Has been under control.  Have her spot check her pressures.  Follow metabolic panel.        Relevant Medications   apixaban (ELIQUIS) 5 MG TABS tablet   Other Relevant Orders   Basic metabolic panel   Hyperglycemia    Low carb diet and exercise.  Follow met b and a1c.        Relevant Orders   Hemoglobin A1c   PAD (peripheral artery disease) ([place])    Followed by Dr Fletcher Anon.  Stable.        Relevant Medications   apixaban (ELIQUIS) 5 MG TABS tablet   Pure hypercholesterolemia    On pravastatin.  Low cholesterol diet and exercise.  Follow lipid panel and liver function tests.        Relevant Medications   apixaban (ELIQUIS) 5 MG TABS tablet   Other Relevant Orders   Hepatic function panel   Lipid panel    Tobacco abuse    Again discussed with her today regarding the need to stop smoking.  She declines to quit.        URI (upper respiratory infection)    Recent symptoms as outlined.  Continue mucinex.  Saline nasal spray and flonase as directed.  xopenex inhaler as directed.  Symptoms improved.  Follow.         Other Visit Diagnoses    Irregular heart rhythm    -  Primary   Relevant Orders   EKG 12-Lead (Completed)   Encounter for immunization       Relevant Orders   Flu vaccine HIGH DOSE PF (Completed)   New onset a-fib ([place])       Relevant Medications   apixaban (ELIQUIS) 5 MG TABS tablet   Other Relevant Orders   Ambulatory referral to Cardiology      I spent 40 minutes with the patient and more than 50% of the time was spent in consultation regarding the above.  Time spent discussing her current symptoms and issues as well as discussing her new diagnosis of afib and treatment.     Einar Pheasant, MD

## 2017-10-19 NOTE — Progress Notes (Signed)
Pre visit review using our clinic review tool, if applicable. No additional management support is needed unless otherwise documented below in the visit note. 

## 2017-10-20 ENCOUNTER — Encounter: Payer: Self-pay | Admitting: Physician Assistant

## 2017-10-20 ENCOUNTER — Telehealth: Payer: Self-pay

## 2017-10-20 ENCOUNTER — Ambulatory Visit (INDEPENDENT_AMBULATORY_CARE_PROVIDER_SITE_OTHER): Payer: PPO | Admitting: Physician Assistant

## 2017-10-20 VITALS — BP 142/80 | HR 126 | Ht 63.0 in | Wt 248.8 lb

## 2017-10-20 DIAGNOSIS — E782 Mixed hyperlipidemia: Secondary | ICD-10-CM

## 2017-10-20 DIAGNOSIS — I4891 Unspecified atrial fibrillation: Secondary | ICD-10-CM | POA: Insufficient documentation

## 2017-10-20 DIAGNOSIS — I739 Peripheral vascular disease, unspecified: Secondary | ICD-10-CM

## 2017-10-20 DIAGNOSIS — I10 Essential (primary) hypertension: Secondary | ICD-10-CM

## 2017-10-20 MED ORDER — DILTIAZEM HCL ER COATED BEADS 240 MG PO CP24: ORAL_CAPSULE | ORAL | 3 refills | Status: DC

## 2017-10-20 NOTE — Assessment & Plan Note (Signed)
Discussed diet and exercise.  Follow.  

## 2017-10-20 NOTE — Assessment & Plan Note (Signed)
Low carb diet and exercise.  Follow met b and a1c.

## 2017-10-20 NOTE — Assessment & Plan Note (Signed)
Recent symptoms as outlined.  Continue mucinex.  Saline nasal spray and flonase as directed.  xopenex inhaler as directed.  Symptoms improved.  Follow.

## 2017-10-20 NOTE — Assessment & Plan Note (Signed)
Again discussed with her today regarding the need to stop smoking.  She declines to quit.

## 2017-10-20 NOTE — Telephone Encounter (Signed)
aut request received for Levalbuterol. Has been submitted on cover my meds. Key [id number]. Pending response.

## 2017-10-20 NOTE — Assessment & Plan Note (Signed)
Followed by Dr Arida.  Stable.   

## 2017-10-20 NOTE — Assessment & Plan Note (Signed)
Blood pressure elevated today.  Has been under control.  Have her spot check her pressures.  Follow metabolic panel.

## 2017-10-20 NOTE — Progress Notes (Signed)
Cardiology Office Note Date:  10/20/2017  Patient ID:  Danielle Yates 1941/03/17, MRN [id number] PCP:  Einar Pheasant, MD  Cardiologist:  Dr. Fletcher Anon, MD    Chief Complaint: New onset Afib  History of Present Illness: Danielle Yates is a 76 y.o. female with history of refractory hypertension, PAD, HLD, obesity, hemorrhoids, diverticulosis, ongoing tobacco abuse, and multiple medication intolerances who presents for evaluation of newly diagnosed Afib.   She is followed by Dr. Fletcher Anon for her refractory HTN and PAD. She has previously not tolerated metoprolol due to bradycardia and fatigue. She has not tolerated amlodipine or Coreg secondary to headache and dizziness. She is also known to be intolerant to statins. She has previously undergone echocardiogram in 2013 that showed normal LV systolic function with an EF of 60-65%, no RWMA, Gr1DD, trivial mitral regurgitation, and a mildly dilated left atrium measuring 43 mm. Renal artery duplex ultrasound in 2013 showed no evidence of RAS. She has known bilateral common iliac artery disease that is being managed medically given lack of claudication symptoms. She was most recently seen by Dr. Fletcher Anon on 08/18/2017 for routine follow up and was doing well. She continued to smoke. She underwent echo on 08/24/17 due to murmur noted on exam that showed an EF of 65-70%, mild LVH, aortic valve sclerosis without stenosis, mildly dilated left atrium measuring 36 mm, the RV cavity size and systolic function was normal.   Patient was seen by her PCP on 10/19/17 for routine follow up. She had been noting some upper airway congestion that she describes typically getting this time of year and has been associated with her tobacco abuse. She was noted to have an irregular rhythm during physical exam. EKG was performed that showed new onset Afib with RVR, 109 bpm, no acute st/t changes. She was previously on Cardizem CD 120 mg daily and this was continued. Given her  elevated CHADS2VASc of at least 5 (HTN, age x 2, vascular disease, sex category), she was started on Eliquis 5 mg bid.    She denies feeling any palpitations, increased SOB, lower extremity swelling, orthopnea, PND, early satiety, dizziness, presyncope or syncope. No chest pain. Has never been told has Afib before. She does not know if she snores, though does not have any daytime fatigue/somnolence. She does not drink excessive amounts of caffeine. No recent injuries, trauma, or increased pain. She is dealing with mild URI-like symptoms which she typically gets this time of year. She has been taking OTC Mucinex (regular). She feels well at this time. She has yet to start Eliquis. She does not have a BP cuff at home.   Recent labs drawn by PCP on 10/17/17 show a TSH of 2.370, potassium 4.3, SCr 0.73, glucose 102, sodium 141, WBC 6.1, HGB 14.0, PLT 244, LDL 86, unremarkable LFT, A1c 5.8.   Review of prior EKGs in Epic shows no previous documented episodes of Afib with a sinus rate ranging from 60s to 80s bpm.    Past Medical History:  Diagnosis Date  . Allergy   . Aortic valve sclerosis    a. TTE 2013: EF 60-65%, no RWMA, Gr1DD, trivial MR, mildly dilated LA 43 mm; b. TTE 08/2017: EF 65-70%, mild LVH, aortic valve sclerosis without stenosis, mildly dilated LA 36 mm, RV size and sys fxn nl  . Arthropathy, unspecified, site unspecified   . Cataract   . Chronic rhinitis   . Disorder of bone and cartilage, unspecified   . Diverticulosis of colon (  without mention of hemorrhage)   . Essential hypertension    a. refractory HTN; b. renal US 2013 negative for RAS  . External hemorrhoids without mention of complication   . Medication intolerance   . Obesity, unspecified   . Other abnormal glucose   . PAF (paroxysmal atrial fibrillation) ([place])    a. diagnosed 10/19/17; b. CHADS2VASc 5 (HTN, age x 2, vascular disease, female); c. Eliquis  . Panic disorder without agoraphobia   . Pure  hypercholesterolemia   . Tobacco use disorder   . Unspecified vitamin D deficiency     Past Surgical History:  Procedure Laterality Date  . APPENDECTOMY  1985  . CHOLECYSTECTOMY  1985   with open wound  . EYE SURGERY  2011   CATARACTS -  BOTH  . Removed spot on face     [name]  . TONSILLECTOMY AND ADENOIDECTOMY  1946  . VAGINAL HYSTERECTOMY  1982   fibroid and endometriosis; one ovary remaining    Current Meds  Medication Sig  . apixaban (ELIQUIS) 5 MG TABS tablet Take 1 tablet (5 mg total) 2 (two) times daily by mouth.  [name] aspirin EC 81 MG tablet Take 81 mg by mouth daily.  [name] diltiazem (CARDIZEM CD) 240 MG 24 hr capsule Take 1 capsule by mouth  daily  . enalapril (VASOTEC) 20 MG tablet TAKE 1 TABLET EVERY MORNING AND 2 TABLETS EVERY EVENING  . FLUoxetine (PROZAC) 20 MG capsule Take 1 capsule by mouth daily  . fluticasone (FLONASE) 50 MCG/ACT nasal spray Place 2 sprays into both nostrils daily.  [name] levalbuterol (XOPENEX HFA) 45 MCG/ACT inhaler Inhale 2 puffs every 6 (six) hours as needed into the lungs for wheezing.  . pravastatin (PRAVACHOL) 10 MG tablet Take 1 tablet (10 mg total) by mouth daily.  . Probiotic Product (PROBIOTIC DAILY PO) Take 1 tablet by mouth.  . triamterene-hydrochlorothiazide (MAXZIDE-25) 37.5-25 MG tablet Take 1 tablet by mouth daily.  [name] VITAMIN D, CHOLECALCIFEROL, PO Take by mouth daily.  . [DISCONTINUED] diltiazem (CARDIZEM CD) 120 MG 24 hr capsule Take 1 capsule by mouth  daily  . [DISCONTINUED] diltiazem (CARDIZEM CD) 240 MG 24 hr capsule Take 1 capsule by mouth  daily    Allergies:   Codeine; Crestor [rosuvastatin]; and Nickel   Social History:  The patient  reports that she has been smoking cigarettes.  She has a 25.00 pack-year smoking history. she has never used smokeless tobacco. She reports that she does not drink alcohol or use drugs.   Family History:  The patient's family history includes Aneurysm in her mother; Heart disease in her brother  and father; Hyperlipidemia in her brother; Hypertension in her brother and sister; Rheumatic fever in her mother.  ROS:   Review of Systems  Constitutional: Positive for malaise/fatigue. Negative for chills, diaphoresis, fever and weight loss.  HENT: Positive for congestion.   Eyes: Negative for discharge and redness.  Respiratory: Positive for cough. Negative for hemoptysis, sputum production, shortness of breath and wheezing.   Cardiovascular: Negative for chest pain, palpitations, orthopnea, claudication, leg swelling and PND.  Gastrointestinal: Negative for abdominal pain, blood in stool, heartburn, melena, nausea and vomiting.  Genitourinary: Negative for hematuria.  Musculoskeletal: Negative for falls and myalgias.  Skin: Negative for rash.  Neurological: Negative for dizziness, tingling, tremors, sensory change, speech change, focal weakness, loss of consciousness and weakness.  Endo/Heme/Allergies: Does not bruise/bleed easily.  Psychiatric/Behavioral: Negative for substance abuse. The patient is not nervous/anxious.   All other systems  reviewed and are negative.    PHYSICAL EXAM:  VS:  BP (!) 142/80 (BP Location: Left Arm, Patient Position: Sitting, Cuff Size: Large)   Pulse (!) 126   Ht 5\' 3"  (1.6 m)   Wt 248 lb 12 oz (112.8 kg)   BMI 44.06 kg/m  BMI: Body mass index is 44.06 kg/m.  Physical Exam  Constitutional: She is oriented to person, place, and time. She appears well-developed and well-nourished.  HENT:  Head: Normocephalic and atraumatic.  Eyes: Right eye exhibits no discharge. Left eye exhibits no discharge.  Neck: Normal range of motion. No JVD present.  Cardiovascular: S1 normal, S2 normal and normal heart sounds. An irregularly irregular rhythm present. Tachycardia present. Exam reveals no distant heart sounds, no friction rub, no midsystolic click and no opening snap.  No murmur heard. Pulses:      Dorsalis pedis pulses are 2+ on the right side, and 2+ on  the left side.       Posterior tibial pulses are 2+ on the right side, and 2+ on the left side.  Pulmonary/Chest: Effort normal and breath sounds normal. No respiratory distress. She has no decreased breath sounds. She has no wheezes. She has no rales. She exhibits no tenderness.  Abdominal: Soft. She exhibits no distension. There is no tenderness.  Musculoskeletal: She exhibits no edema.  Neurological: She is alert and oriented to person, place, and time.  Skin: Skin is warm and dry. No cyanosis. Nails show no clubbing.  Psychiatric: She has a normal mood and affect. Her speech is normal and behavior is normal. Judgment and thought content normal.     EKG:  Was ordered and interpreted by me today. Shows Afib with RVR, 126 bpm, nonspecific st/t changes  Recent Labs: 10/17/2017: ALT 12; BUN 14; Creatinine, Ser 0.73; Hemoglobin 14.0; Platelets 244; Potassium 4.3; Sodium 141; TSH 2.370  10/17/2017: Chol/HDL Ratio 2.7; Cholesterol, Total 163; HDL 60; LDL Calculated 86; Triglycerides 86   Estimated Creatinine Clearance: 72.3 mL/min (by C-G formula based on SCr of 0.73 mg/dL).   Wt Readings from Last 3 Encounters:  10/20/17 248 lb 12 oz (112.8 kg)  10/19/17 250 lb 9.6 oz (113.7 kg)  08/18/17 248 lb (112.5 kg)     Other studies reviewed: Additional studies/records reviewed today include: summarized above  ASSESSMENT AND PLAN:  1. New onset Afib with RVR: She remains in Afib with heart rate in the 110s to 120s bpm. Asymptomatic. Increase Cardizem CD to 240 mg daily. She is intolerant to beta blockers. If needed, consider digoxin, though would like to avoid this medication. CHADS2VASc of at least 5 (HTN, age x 2, vascular disease, sex category), giving her an estimated annual stroke risk of 6.7%. Agree with Eliquis 5 mg bid, start today. Advised patient of the importance of full compliance with anticoagulation. She will come in for an RN visit in 1 week to assess for rate and rhythm control. If  she remains in Afib at that time, medications will be titrated to obtain goal heart rate of < 100 bpm and she will be scheduled for outpatient DCCV after she has been adequately anticoagulated for at least 3 weeks with uninterrupted, full-dose anticoagulation. If she becomes symptomatic or if her heart rate is difficult to control she would require TEE/DCCV sooner, though at this time she is asymptomatic and stable. I would also like to see her URI improved prior to performing DCCV in an effort to have a successful cardioversion. It does not sound  as though she has undiagnosed sleep apnea, though this is to be considered. Recent labs including TSH and lytes were unremarkable as above.    2. Refractory HTN: BP Mildly elevated today in the 388E systolic. Cardizem increased as above. Continue enalapril. Advised patient to obtain a home BP cuff.    3. PAD: Followed by Dr. Fletcher Anon. Continue ASA (this is dual therapy with the above recent starting of Eliquis) given DOACs have not been studied in PAD.   4. HLD: Recent LDL of 86 on 10/17/17. Continue statin. Has previously not tolerated high-intensity statin. Followed by PCP.   5. Medication intolerances: Unable to use beta blocker for rate control. Unable to place on high-intensity statin.   Disposition: F/u with RN in 1 week to assess heart rate an rhythm. Will schedule DCCV at that time if needed. Follow up with myself or Dr. Fletcher Anon in 1 month.   Current medicines are reviewed at length with the patient today.  The patient did not have any concerns regarding medicines.  Melvern Banker PA-C 10/20/2017 9:23 AM     [place] [address], [place] [address] [phone]

## 2017-10-20 NOTE — Patient Instructions (Addendum)
Medication Instructions:  Please INCREASE your cardizem to 240 mg once daily  Labwork: None  Testing/Procedures: Nurse visit for EKG in 1 week  Follow-Up: 1 month with Dr. Fletcher Anon or Christell Faith, PA   If you need a refill on your cardiac medications before your next appointment, please call your pharmacy.

## 2017-10-20 NOTE — Assessment & Plan Note (Signed)
On pravastatin.  Low cholesterol diet and exercise.  Follow lipid panel and liver function tests.   

## 2017-10-20 NOTE — Assessment & Plan Note (Signed)
Stable on prozac.  

## 2017-10-20 NOTE — Assessment & Plan Note (Addendum)
Noted irregular rhythm on exam.  EKG - Afib with ventricular rate 110.  Recheck pulse rate on exam 100.  On cardizem.  May need to adjust dose.  Hold on changing.  Compete treatment for respiratory issues.  Discussed diagnosis of afib.  Just had echo as outlined.  Refer back to cardiology for evaluation.  Discussed starting eliquis.  She is in agreement.  Recent tsh and metabolic panel wnl.

## 2017-10-21 NOTE — Telephone Encounter (Signed)
Copied from [place] #[id number]. Topic: Quick Communication - See Telephone Encounter >> Oct 21, 2017  4:42 PM Ebony Hail wrote: CRM for notification. See Telephone encounter for: Prior Auth needed for levalbuterol [facility] HFA) 45 MCG/ACT inhaler [[id number]]  Please call [phone]  10/21/17.

## 2017-10-24 ENCOUNTER — Telehealth: Payer: Self-pay | Admitting: Physician Assistant

## 2017-10-24 NOTE — Telephone Encounter (Signed)
Rx follow up

## 2017-10-24 NOTE — Telephone Encounter (Signed)
Have sent auth pending response ppw faxed as well

## 2017-10-24 NOTE — Telephone Encounter (Signed)
LMOVM to verify medication Change for Diltiazem.

## 2017-10-24 NOTE — Telephone Encounter (Signed)
[facility] calling needing to get an update on dose   New medication is Cardizem TZ 240 mg   The old one was  Cartia XT 120 mg  Just need to verify the change.

## 2017-10-26 ENCOUNTER — Ambulatory Visit (INDEPENDENT_AMBULATORY_CARE_PROVIDER_SITE_OTHER): Payer: PPO | Admitting: *Deleted

## 2017-10-26 VITALS — BP 168/78 | HR 100 | Ht 63.0 in | Wt 249.0 lb

## 2017-10-26 DIAGNOSIS — I4891 Unspecified atrial fibrillation: Secondary | ICD-10-CM | POA: Diagnosis not present

## 2017-10-26 MED ORDER — DILTIAZEM HCL ER BEADS 360 MG PO CP24: 360.0000 mg | ORAL_CAPSULE | Freq: Every day | ORAL | 3 refills | Status: DC

## 2017-10-26 NOTE — Patient Instructions (Signed)
Medication Instructions:  Your physician has recommended you make the following change in your medication:  1- INCREASE  Diltiazem to 360 mg by mouth once a day.  It is ok to take 3 of your 120 mg tablets at the same time once a day; or take 1 of your 120 mg tablets along with 1 of your 240 mg tablets at the same time once a day. Either combination will equal to 360 mg once a day.   Labwork: none  Testing/Procedures: none  Follow-Up: Your  physician recommends that you schedule a follow-up appointment in: as scheduled with Dr Fletcher Anon.   If you need a refill on your cardiac medications before your next appointment, please call your pharmacy.

## 2017-10-26 NOTE — Progress Notes (Signed)
1.) Reason for visit: EKG check/HR check  2.) Name of MD requesting visit: Christell Faith, PA-C*  3.) H&P: atrial fib**  4.) ROS related to problem: patient here for f/u EKG check and heart rate check from newly diagnosed A. Fib. Denies any problems today. Denies chest pain, Shortness of breath, dizziness, palpitations, or fatigue. Discussed possibility of Cardioversion and she would like to wait and see provider in f/u before moving forward.  5.) Assessment and plan per MD: EKG shown to Ignacia Bayley, NP. He reviewed chart and increased Diltiazem to 360 mg by mouth once a day and keep f/u as scheduled to discuss further cardioversion if patient does not want to schedule at this time.  Patient verbalized understanding. AVS completed and given to patient.

## 2017-11-09 NOTE — Telephone Encounter (Signed)
Error

## 2017-11-22 ENCOUNTER — Ambulatory Visit: Payer: PPO | Admitting: Nurse Practitioner

## 2017-11-22 ENCOUNTER — Encounter: Payer: Self-pay | Admitting: Nurse Practitioner

## 2017-11-22 VITALS — BP 158/60 | HR 105 | Ht 63.0 in | Wt 250.2 lb

## 2017-11-22 DIAGNOSIS — I481 Persistent atrial fibrillation: Secondary | ICD-10-CM

## 2017-11-22 DIAGNOSIS — I10 Essential (primary) hypertension: Secondary | ICD-10-CM | POA: Diagnosis not present

## 2017-11-22 DIAGNOSIS — Z720 Tobacco use: Secondary | ICD-10-CM | POA: Diagnosis not present

## 2017-11-22 DIAGNOSIS — I4819 Other persistent atrial fibrillation: Secondary | ICD-10-CM

## 2017-11-22 NOTE — Progress Notes (Signed)
Office Visit    Patient Name: Danielle Yates Date of Encounter: 11/22/2017  Primary Care Provider:  Einar Pheasant, MD Primary Cardiologist:  Kathlyn Sacramento, MD  Chief Complaint    76 year old female with a history of hypertension, hyperlipidemia, asymptomatic peripheral arterial disease, obesity, tobacco abuse, and recent diagnosis of atrial fibrillation, who presents for follow-up.  Past Medical History    Past Medical History:  Diagnosis Date  . Allergy   . Aortic valve sclerosis    a. TTE 2013: EF 60-65%, no RWMA, Gr1DD, trivial MR, mildly dilated LA 43 mm; b. TTE 08/2017: EF 65-70%, mild LVH, aortic valve sclerosis without stenosis, mildly dilated LA 36 mm, RV size and sys fxn nl  . Arthropathy, unspecified, site unspecified   . Cataract   . Chronic rhinitis   . Disorder of bone and cartilage, unspecified   . Diverticulosis of colon (without mention of hemorrhage)   . Essential hypertension    a. refractory HTN; b. renal US 2013 negative for RAS  . External hemorrhoids without mention of complication   . Medication intolerance   . Obesity, unspecified   . Other abnormal glucose   . Panic disorder without agoraphobia   . Peripheral arterial disease ([place])    a.  Known bilateral common iliac disease-> medical therapy-asymptomatic.  [name] Persistent atrial fibrillation ([place])    a. diagnosed 10/19/17; b. CHADS2VASc 5 (HTN, age x 2, vascular disease, female)-->Eliquis  . Pure hypercholesterolemia   . Tobacco use disorder   . Unspecified vitamin D deficiency    Past Surgical History:  Procedure Laterality Date  . APPENDECTOMY  1985  . CHOLECYSTECTOMY  1985   with open wound  . EYE SURGERY  2011   CATARACTS -  BOTH  . Removed spot on face     [name]  . TONSILLECTOMY AND ADENOIDECTOMY  1946  . VAGINAL HYSTERECTOMY  1982   fibroid and endometriosis; one ovary remaining    Allergies  Allergies  Allergen Reactions  . Amlodipine     Headache/dizziness  . Codeine  Nausea Only  . Coreg [Carvedilol]     Headache/dizziness  . Crestor [Rosuvastatin] Other (See Comments)    PER PT MADE ME FEEL" WEIRD"  . Metoprolol     bradycardia  . Statins     Tolerates low dose pravastatin.  . Nickel Rash    History of Present Illness    76 year old female with the above complex past medical history including hypertension, hyperlipidemia, asymptomatic peripheral arterial disease, obesity, tobacco abuse, and recent diagnosis of atrial fibrillation.  She was asymptomatic at the time of discovery, which occurred during a routine physical exam.  She had an irregular heart rhythm and a follow-up ECG showed A. fib with RVR at a rate of 109.  She followed up here and her diltiazem dose was initially adjusted to 240 mg daily and then subsequently titrated further to 360 mg daily in the setting of poorly controlled heart rates.  Patient has been taking Eliquis and reports compliance over the past 4 weeks.  She presents today to discuss cardioversion though notes that at this point, she is not ready to commit but will think about it some more when she gets home.  She has not been particularly symptomatic and denies palpitations, chest pain, dyspnea, PND, orthopnea, dizziness, syncope, edema, or early satiety.  Home Medications    Prior to Admission medications   Medication Sig Start Date End Date Taking? Authorizing Provider  apixaban (ELIQUIS) 5  MG TABS tablet Take 1 tablet (5 mg total) 2 (two) times daily by mouth. 10/19/17  Yes Einar Pheasant, MD  aspirin EC 81 MG tablet Take 81 mg by mouth daily.   Yes [provider]  diltiazem (TIAZAC) 360 MG 24 hr capsule Take 1 capsule (360 mg total) by mouth daily. 10/26/17  Yes Rogelia Mire, NP  enalapril (VASOTEC) 20 MG tablet TAKE 1 TABLET EVERY MORNING AND 2 TABLETS EVERY EVENING 02/17/17  Yes Einar Pheasant, MD  FLUoxetine (PROZAC) 20 MG capsule Take 1 capsule by mouth daily 10/04/17  Yes Einar Pheasant, MD    fluticasone (FLONASE) 50 MCG/ACT nasal spray Place 2 sprays into both nostrils daily. 02/16/17  Yes Einar Pheasant, MD  pravastatin (PRAVACHOL) 10 MG tablet Take 1 tablet (10 mg total) by mouth daily. 02/09/16  Yes Einar Pheasant, MD  Probiotic Product (PROBIOTIC DAILY PO) Take 1 tablet by mouth.   Yes [provider]  triamterene-hydrochlorothiazide (MAXZIDE-25) 37.5-25 MG tablet Take 1 tablet by mouth daily. 02/16/17  Yes Einar Pheasant, MD  VITAMIN D, CHOLECALCIFEROL, PO Take by mouth daily.   Yes [provider]    Review of Systems    She denies chest pain, palpitations, dyspnea, pnd, orthopnea, n, v, dizziness, syncope, edema, weight gain, or early satiety.  All other systems reviewed and are otherwise negative except as noted above.  Physical Exam    VS:  BP (!) 158/60 (BP Location: Left Arm, Patient Position: Sitting, Cuff Size: Large)   Pulse (!) 105   Ht 5\' 3"  (1.6 m)   Wt 250 lb 4 oz (113.5 kg)   BMI 44.33 kg/m  , BMI Body mass index is 44.33 kg/m.  Repeat blood pressure 138/80 GEN: Well nourished, well developed, in no acute distress.  HEENT: normal.  Neck: Supple, no JVD, carotid bruits, or masses. Cardiac: Irregularly irregular, tachycardic, 2/6 systolic ejection murmur at the right upper sternal border, no rubs, or gallops. No clubbing, cyanosis, edema.  Radials/DP/PT 2+ and equal bilaterally.  Respiratory:  Respirations regular and unlabored, clear to auscultation bilaterally. GI: Soft, nontender, nondistended, BS + x 4. MS: no deformity or atrophy. Skin: warm and dry, no rash. Neuro:  Strength and sensation are intact. Psych: Normal affect.  Accessory Clinical Findings    ECG -atrial fibrillation, 105, nonspecific T changes  Assessment & Plan    1.  Persistent atrial fibrillation: This was diagnosed November 14.  She is relatively asymptomatic.  Diltiazem has been titrated to 360 mg daily and heart rate is 105 today.  She has previous  intolerances of metoprolol and carvedilol.  She thinks the heart rates are better controlled at home.  She sometimes checks her pulse and gets rates in the 60s-90s.  She reports compliance with her Eliquis, not having missed any doses.  We discussed proceeding with cardioversion at length today.  She does not want to schedule it right now and instead says that she will give [place] a call and likely scheduled for early next year.  I have asked her to check her pulse daily and contact us if rates are consistently over 100, as we would likely add digoxin to her current regimen.  We could also consider antiarrhythmic therapy if she wishes to avoid cardioversion.  She does have a history of smoking and probable COPD, thus amiodarone may not be the best selection.  Of note, I did offer to arrange for sleep study and she is not currently interested.  2.  Essential  hypertension: Blood pressure elevated 150/60.  I did repeat this and got 138/80.  She does have a blood pressure cuff at home and will assess this further.  She remains on diltiazem, Maxzide, and enalapril.  3.  Tobacco abuse: Cessation advised.  She is not currently considering quitting.  4.  Hyperlipidemia: LDL was 86 in November.  She remains on statin therapy.  5.  Peripheral arterial disease: No claudication.  She remains on low-dose aspirin and statin therapy.  6.  Disposition: Patient will contact us to inform us when she would like to schedule cardioversion and also report on heart rates.  We will plan on follow-up with Dr. Fletcher Anon in late January, which should fall after cardioversion, presuming that she schedules it.   Murray Hodgkins, NP 11/22/2017, 9:13 AM

## 2017-11-22 NOTE — Patient Instructions (Signed)
Medication Instructions: - Your physician recommends that you continue on your current medications as directed. Please refer to the Current Medication list given to you today.  Labwork: - none ordered  Procedures/Testing: - none ordered  Follow-Up: - Your physician recommends that you schedule a follow-up appointment in: Late January with Dr. Fletcher Danielle Yates   Any Additional Special Instructions Will Be Listed Below (If Applicable). - please monitor your heart rates at home- if it is > 100 then call the office  - if you decide you would like to proceed with Cardioversion then please call the office [phone]   If you need a refill on your cardiac medications before your next appointment, please call your pharmacy.        Electrical Cardioversion Electrical cardioversion is the delivery of a jolt of electricity to restore a normal rhythm to the heart. A rhythm that is too fast or is not regular keeps the heart from pumping well. In this procedure, sticky patches or metal paddles are placed on the chest to deliver electricity to the heart from a device. This procedure may be done in an emergency if:  There is low or no blood pressure as a result of the heart rhythm.  Normal rhythm must be restored as fast as possible to protect the brain and heart from further damage.  It may save a life.  This procedure may also be done for irregular or fast heart rhythms that are not immediately life-threatening. Tell a health care provider about:  Any allergies you have.  All medicines you are taking, including vitamins, herbs, eye drops, creams, and over-the-counter medicines.  Any problems you or family members have had with anesthetic medicines.  Any blood disorders you have.  Any surgeries you have had.  Any medical conditions you have.  Whether you are pregnant or may be pregnant. What are the risks? Generally, this is a safe procedure. However, problems may occur,  including:  Allergic reactions to medicines.  A blood clot that breaks free and travels to other parts of your body.  The possible return of an abnormal heart rhythm within hours or days after the procedure.  Your heart stopping (cardiac arrest). This is rare.  What happens before the procedure? Medicines  Your health care provider may have you start taking: ? Blood-thinning medicines (anticoagulants) so your blood does not clot as easily. ? Medicines may be given to help stabilize your heart rate and rhythm.  Ask your health care provider about changing or stopping your regular medicines. This is especially important if you are taking diabetes medicines or blood thinners. General instructions  Plan to have someone take you home from the hospital or clinic.  If you will be going home right after the procedure, plan to have someone with you for 24 hours.  Follow instructions from your health care provider about eating or drinking restrictions. What happens during the procedure?  To lower your risk of infection: ? Your health care team will wash or sanitize their hands. ? Your skin will be washed with soap.  An IV tube will be inserted into one of your veins.  You will be given a medicine to help you relax (sedative).  Sticky patches (electrodes) or metal paddles may be placed on your chest.  An electrical shock will be delivered. The procedure may vary among health care providers and hospitals. What happens after the procedure?  Your blood pressure, heart rate, breathing rate, and blood oxygen level will be monitored  until the medicines you were given have worn off.  Do not drive for 24 hours if you were given a sedative.  Your heart rhythm will be watched to make sure it does not change. This information is not intended to replace advice given to you by your health care provider. Make sure you discuss any questions you have with your health care provider. Document  Released: 11/12/2002 Document Revised: 07/21/2016 Document Reviewed: 05/28/2016 Elsevier Interactive Patient Education  2017 [employer].

## 2017-11-23 DIAGNOSIS — H353131 Nonexudative age-related macular degeneration, bilateral, early dry stage: Secondary | ICD-10-CM | POA: Diagnosis not present

## 2017-12-12 ENCOUNTER — Telehealth: Payer: Self-pay | Admitting: Internal Medicine

## 2017-12-12 NOTE — Telephone Encounter (Signed)
Called patient at [phone] to verify her local pharmacy to call in refills of Eliquis also to find out how long will it take for the mail order refill to arrive to her, left VM to call back to office.

## 2017-12-12 NOTE — Telephone Encounter (Signed)
Copied from [place] [id number]. Topic: Quick Communication - See Telephone Encounter >> Dec 12, 2017  4:07 PM Boyd Kerbs wrote: CRM for notification. See Telephone encounter for:  Prescription request for Eliqust.  Please call into  She will be out by end of week and said she may have to get some to last until receives her mail order.  [facility] [place], [place] [address] Phone: [phone] Fax: [phone]   12/12/17.

## 2017-12-13 ENCOUNTER — Other Ambulatory Visit: Payer: Self-pay | Admitting: Internal Medicine

## 2017-12-13 NOTE — Telephone Encounter (Signed)
Pt called in regard to Eliquis prescription; she states that she has enough medication to get through the weekend and that she has ordered some from the mail order pharmacy but is unsure when it will get here' pt would like to have some Eliquis called in to [facility] to cover her I case the mail order prescription does not arrive before she runs out; will route to [place] pool for disposition.

## 2017-12-27 ENCOUNTER — Telehealth: Payer: Self-pay | Admitting: Internal Medicine

## 2017-12-27 MED ORDER — TRIAMTERENE-HCTZ 37.5-25 MG PO TABS: 1.0000 | ORAL_TABLET | Freq: Every day | ORAL | 1 refills | Status: DC

## 2017-12-27 NOTE — Telephone Encounter (Signed)
Copied from [place]. Topic: Quick Communication - See Telephone Encounter >> Dec 27, 2017 10:17 AM Oneta Rack wrote: CRM for notification. See Telephone encounter for:   12/27/17.   Relation to pt: self Call back [phone] Pharmacy: [facility], [place] [phone] (Phone) [phone] (Fax)   Reason for call:  Patient requesting  triamterene-hydrochlorothiazide (MAXZIDE-25) 37.5-25 MG tablet, patient denied contacting mail order, informed patient in the future please do and allow 48 to 72 hour turn around, please advise

## 2018-01-04 ENCOUNTER — Other Ambulatory Visit: Payer: Self-pay | Admitting: Internal Medicine

## 2018-01-10 ENCOUNTER — Other Ambulatory Visit: Payer: Self-pay | Admitting: Internal Medicine

## 2018-01-19 ENCOUNTER — Telehealth: Payer: Self-pay | Admitting: Internal Medicine

## 2018-01-19 ENCOUNTER — Encounter: Payer: Self-pay | Admitting: Cardiovascular Disease

## 2018-01-19 ENCOUNTER — Ambulatory Visit: Payer: PPO | Admitting: Cardiovascular Disease

## 2018-01-19 VITALS — BP 138/70 | HR 91 | Ht 63.0 in | Wt 253.8 lb

## 2018-01-19 DIAGNOSIS — I739 Peripheral vascular disease, unspecified: Secondary | ICD-10-CM

## 2018-01-19 DIAGNOSIS — I481 Persistent atrial fibrillation: Secondary | ICD-10-CM

## 2018-01-19 DIAGNOSIS — I10 Essential (primary) hypertension: Secondary | ICD-10-CM

## 2018-01-19 DIAGNOSIS — I4819 Other persistent atrial fibrillation: Secondary | ICD-10-CM

## 2018-01-19 DIAGNOSIS — E785 Hyperlipidemia, unspecified: Secondary | ICD-10-CM | POA: Diagnosis not present

## 2018-01-19 DIAGNOSIS — Z720 Tobacco use: Secondary | ICD-10-CM

## 2018-01-19 NOTE — Telephone Encounter (Signed)
Pt cancelled f/u and rescheduled for may. Ok to fill until appt?

## 2018-01-19 NOTE — Progress Notes (Signed)
Cardiology Office Note   Date:  01/19/2018   ID:  Rayleigh, Gillyard 17-Oct-1941, MRN [id number]  PCP:  Einar Pheasant, MD  Cardiologist:   Kathlyn Sacramento, MD   Chief Complaint  Patient presents with  . OTHER    1 month f/u no complaints today . Meds reviewed verbally with pt.      History of Present Illness: Danielle Yates is a 77 y.o. female who presents for a followup visit regarding persistent atrial fibrillation, refractory hypertension and peripheral arterial disease. She did not tolerate metoprolol due to bradycardia and fatigue. She did not tolerate amlodipine and carvedilol due to headache and dizziness.   She underwent renal duplex ultrasound in 2013 which showed no evidence of renal artery stenosis.   She is known to have bilateral common iliac artery disease with peak velocity around 300 with normal ABI bilaterally. She is being treated medically due to lack of significant claudication. She is taking pravastatin for hyperlipidemia. She did not tolerate other statins in the past.  She was diagnosed with atrial fibrillation in November of last year.  She was started on Eliquis for anticoagulation and diltiazem for rate control.  She was completely asymptomatic. Echocardiogram in September 2018 showed normal LV systolic function, aortic sclerosis without significant stenosis and mildly dilated left atrium.  She has been doing reasonably well and denies any chest pain or worsening dyspnea.  She denies any palpitations, dizziness or syncope.  She does not have history of sleep apnea and is not aware of loud snoring or apnea episodes but there is actually nobody around her when she sleeps except her dog.  Past Medical History:  Diagnosis Date  . Allergy   . Aortic valve sclerosis    a. TTE 2013: EF 60-65%, no RWMA, Gr1DD, trivial MR, mildly dilated LA 43 mm; b. TTE 08/2017: EF 65-70%, mild LVH, aortic valve sclerosis without stenosis, mildly dilated LA 36 mm, RV size  and sys fxn nl  . Arthropathy, unspecified, site unspecified   . Cataract   . Chronic rhinitis   . Disorder of bone and cartilage, unspecified   . Diverticulosis of colon (without mention of hemorrhage)   . Essential hypertension    a. refractory HTN; b. renal US 2013 negative for RAS  . External hemorrhoids without mention of complication   . Medication intolerance   . Obesity, unspecified   . Other abnormal glucose   . Panic disorder without agoraphobia   . Peripheral arterial disease ([place])    a.  Known bilateral common iliac disease-> medical therapy-asymptomatic.  [name] Persistent atrial fibrillation ([place])    a. diagnosed 10/19/17; b. CHADS2VASc 5 (HTN, age x 2, vascular disease, female)-->Eliquis  . Pure hypercholesterolemia   . Tobacco use disorder   . Unspecified vitamin D deficiency     Past Surgical History:  Procedure Laterality Date  . APPENDECTOMY  1985  . CHOLECYSTECTOMY  1985   with open wound  . EYE SURGERY  2011   CATARACTS -  BOTH  . Removed spot on face     [name]  . TONSILLECTOMY AND ADENOIDECTOMY  1946  . VAGINAL HYSTERECTOMY  1982   fibroid and endometriosis; one ovary remaining     Current Outpatient Medications  Medication Sig Dispense Refill  . aspirin EC 81 MG tablet Take 81 mg by mouth daily.    [name] diltiazem (TIAZAC) 360 MG 24 hr capsule Take 1 capsule (360 mg total) by mouth daily. 90 capsule 3  .  ELIQUIS 5 MG TABS tablet TAKE 1 TABLET(5 MG) BY MOUTH TWICE DAILY 60 tablet 0  . enalapril (VASOTEC) 20 MG tablet TAKE 1 TABLET EVERY MORNING AND 2 TABLETS EVERY EVENING 270 tablet 3  . FLUoxetine (PROZAC) 20 MG capsule Take 1 capsule by mouth daily 90 capsule 0  . fluticasone (FLONASE) 50 MCG/ACT nasal spray SHAKE LIQUID AND USE 2 SPRAYS IN EACH NOSTRIL DAILY 48 g 2  . pravastatin (PRAVACHOL) 10 MG tablet Take 1 tablet (10 mg total) by mouth daily. 90 tablet 3  . Probiotic Product (PROBIOTIC DAILY PO) Take 1 tablet by mouth.    .  triamterene-hydrochlorothiazide (MAXZIDE-25) 37.5-25 MG tablet Take 1 tablet by mouth daily. 90 tablet 1  . VITAMIN D, CHOLECALCIFEROL, PO Take by mouth daily.     No current facility-administered medications for this visit.     Allergies:   Amlodipine; Codeine; Coreg [carvedilol]; Crestor [rosuvastatin]; Metoprolol; Statins; and Nickel    Social History:  The patient  reports that she has been smoking cigarettes.  She has a 25.00 pack-year smoking history. she has never used smokeless tobacco. She reports that she does not drink alcohol or use drugs.   Family History:  The patient's family history includes Aneurysm in her mother; Heart disease in her brother and father; Hyperlipidemia in her brother; Hypertension in her brother and sister; Rheumatic fever in her mother.    ROS:  Please see the history of present illness.   Otherwise, review of systems are positive for none.   All other systems are reviewed and negative.    PHYSICAL EXAM: VS:  BP 138/70 (BP Location: Left Arm, Patient Position: Sitting, Cuff Size: Normal)   Pulse 91   Ht 5\' 3"  (1.6 m)   Wt 253 lb 12 oz (115.1 kg)   BMI 44.95 kg/m  , BMI Body mass index is 44.95 kg/m. GEN: Well nourished, well developed, in no acute distress  HEENT: normal  Neck: no JVD, carotid bruits, or masses Cardiac: Irregularly irregular; 2/6 systolic ejection murmur in the aortic area. No rubs, gallops or edema  Respiratory:  clear to auscultation bilaterally, normal work of breathing GI: soft, nontender, nondistended, + BS MS: no deformity or atrophy  Skin: warm and dry, no rash Neuro:  Strength and sensation are intact Psych: euthymic mood, full affect Dorsalis pedis is normal bilaterally.  EKG:  EKG is ordered today. The ekg ordered today demonstrates atrial fibrillation with low voltage.  Ventricular rate of 91 bpm.   Recent Labs: 10/17/2017: ALT 12; BUN 14; Creatinine, Ser 0.73; Hemoglobin 14.0; Platelets 244; Potassium 4.3;  Sodium 141; TSH 2.370    Lipid Panel    Component Value Date/Time   CHOL 163 10/17/2017 0825   TRIG 86 10/17/2017 0825   HDL 60 10/17/2017 0825   CHOLHDL 2.7 10/17/2017 0825   LDLCALC 86 10/17/2017 0825      Wt Readings from Last 3 Encounters:  01/19/18 253 lb 12 oz (115.1 kg)  11/22/17 250 lb 4 oz (113.5 kg)  10/26/17 249 lb (112.9 kg)       No flowsheet data found.    ASSESSMENT AND PLAN:  1.  Persistent atrial fibrillation: She continues to be in atrial fibrillation but ventricular rate is more controlled than before on diltiazem.  She is tolerating anticoagulation with Eliquis.  I discontinued aspirin to minimize risk of bleeding. I discussed with her the option of proceeding with cardioversion but she prefers not to go this route given that she  is asymptomatic and she is afraid of being "put to sleep".  We do have the option of adding a small dose metoprolol in the future if needed for better rate control.  In the past, she did not tolerate metoprolol due to bradycardia but she was in sinus rhythm at that time.  2. Peripheral arterial disease: Mainly with disease affecting the common iliac arteries bilaterally. The patient has palpable distal pulses and has no significant claudication at the present time. Recommend continuing medical therapy.  3.  Aortic sclerosis: No significant stenosis in most recent echocardiogram in September of last year  4. Essential hypertension: Blood pressure is reasonably controlled on current medications.  I discussed with her the importance of low-sodium diet  5.  Hyperlipidemia: Currently on low-dose pravastatin due to intolerance to other statins.  6. Tobacco use: I discussed with her again the importance of smoking cessation.  7.  Morbid obesity: I discussed with her the importance of starting an exercise program attempting weight loss.   Disposition:   FU with me in 6 months.  Signed,  Kathlyn Sacramento, MD  01/19/2018 10:08 AM      [place]

## 2018-01-19 NOTE — Telephone Encounter (Signed)
Copied from [place] [phone]. Topic: Quick Communication - Rx Refill/Question >> Jan 19, 2018 11:31 AM Cleaster Corin, NT wrote: Medication: Prozac   Has the patient contacted their pharmacy? yes   (Agent: If no, request that the patient contact the pharmacy for the refill.)   Preferred Pharmacy (with phone number or street name): [facility] [place], [place] [address] Phone: [phone] Fax: [phone]     Agent: Please be advised that RX refills may take up to 3 business days. We ask that you follow-up with your pharmacy.

## 2018-01-19 NOTE — Telephone Encounter (Signed)
Ok

## 2018-01-19 NOTE — Patient Instructions (Addendum)
Medication Instructions:  Your physician has recommended you make the following change in your medication:  STOP taking aspirin   Labwork: none  Testing/Procedures: none  Follow-Up: Your physician wants you to follow-up in: 6 months with Dr. Fletcher Anon.  You will receive a reminder letter in the mail two months in advance. If you don't receive a letter, please call our office to schedule the follow-up appointment.   Any Other Special Instructions Will Be Listed Below (If Applicable).      If you need a refill on your cardiac medications before your next appointment, please call your pharmacy.   Low-Sodium Eating Plan Sodium, which is an element that makes up salt, helps you maintain a healthy balance of fluids in your body. Too much sodium can increase your blood pressure and cause fluid and waste to be held in your body. Your health care provider or dietitian may recommend following this plan if you have high blood pressure (hypertension), kidney disease, liver disease, or heart failure. Eating less sodium can help lower your blood pressure, reduce swelling, and protect your heart, liver, and kidneys. What are tips for following this plan? General guidelines  Most people on this plan should limit their sodium intake to 1,500-2,000 mg (milligrams) of sodium each day. Reading food labels  The Nutrition Facts label lists the amount of sodium in one serving of the food. If you eat more than one serving, you must multiply the listed amount of sodium by the number of servings.  Choose foods with less than 140 mg of sodium per serving.  Avoid foods with 300 mg of sodium or more per serving. Shopping  Look for lower-sodium products, often labeled as "low-sodium" or "no salt added."  Always check the sodium content even if foods are labeled as "unsalted" or "no salt added".  Buy fresh foods. ? Avoid canned foods and premade or frozen meals. ? Avoid canned, cured, or processed  meats  Buy breads that have less than 80 mg of sodium per slice. Cooking  Eat more home-cooked food and less restaurant, buffet, and fast food.  Avoid adding salt when cooking. Use salt-free seasonings or herbs instead of table salt or sea salt. Check with your health care provider or pharmacist before using salt substitutes.  Cook with plant-based oils, such as canola, sunflower, or olive oil. Meal planning  When eating at a restaurant, ask that your food be prepared with less salt or no salt, if possible.  Avoid foods that contain MSG (monosodium glutamate). MSG is sometimes added to [place] food, bouillon, and some canned foods. What foods are recommended? The items listed may not be a complete list. Talk with your dietitian about what dietary choices are best for you. Grains Low-sodium cereals, including oats, puffed wheat and rice, and shredded wheat. Low-sodium crackers. Unsalted rice. Unsalted pasta. Low-sodium bread. Whole-grain breads and whole-grain pasta. Vegetables Fresh or frozen vegetables. "No salt added" canned vegetables. "No salt added" tomato sauce and paste. Low-sodium or reduced-sodium tomato and vegetable juice. Fruits Fresh, frozen, or canned fruit. Fruit juice. Meats and other protein foods Fresh or frozen (no salt added) meat, poultry, seafood, and fish. Low-sodium canned tuna and salmon. Unsalted nuts. Dried peas, beans, and lentils without added salt. Unsalted canned beans. Eggs. Unsalted nut butters. Dairy Milk. Soy milk. Cheese that is naturally low in sodium, such as ricotta cheese, fresh mozzarella, or Swiss cheese Low-sodium or reduced-sodium cheese. Cream cheese. Yogurt. Fats and oils Unsalted butter. Unsalted margarine with no trans  fat. Vegetable oils such as canola or olive oils. Seasonings and other foods Fresh and dried herbs and spices. Salt-free seasonings. Low-sodium mustard and ketchup. Sodium-free salad dressing. Sodium-free light mayonnaise.  Fresh or refrigerated horseradish. Lemon juice. Vinegar. Homemade, reduced-sodium, or low-sodium soups. Unsalted popcorn and pretzels. Low-salt or salt-free chips. What foods are not recommended? The items listed may not be a complete list. Talk with your dietitian about what dietary choices are best for you. Grains Instant hot cereals. Bread stuffing, pancake, and biscuit mixes. Croutons. Seasoned rice or pasta mixes. Noodle soup cups. Boxed or frozen macaroni and cheese. Regular salted crackers. Self-rising flour. Vegetables Sauerkraut, pickled vegetables, and relishes. Olives. [place] fries. Onion rings. Regular canned vegetables (not low-sodium or reduced-sodium). Regular canned tomato sauce and paste (not low-sodium or reduced-sodium). Regular tomato and vegetable juice (not low-sodium or reduced-sodium). Frozen vegetables in sauces. Meats and other protein foods Meat or fish that is salted, canned, smoked, spiced, or pickled. Bacon, ham, sausage, hotdogs, corned beef, chipped beef, packaged lunch meats, salt pork, jerky, pickled herring, anchovies, regular canned tuna, sardines, salted nuts. Dairy Processed cheese and cheese spreads. Cheese curds. Blue cheese. Feta cheese. String cheese. Regular cottage cheese. Buttermilk. Canned milk. Fats and oils Salted butter. Regular margarine. Ghee. Bacon fat. Seasonings and other foods Onion salt, garlic salt, seasoned salt, table salt, and sea salt. Canned and packaged gravies. Worcestershire sauce. Tartar sauce. Barbecue sauce. Teriyaki sauce. Soy sauce, including reduced-sodium. Steak sauce. Fish sauce. Oyster sauce. Cocktail sauce. Horseradish that you find on the shelf. Regular ketchup and mustard. Meat flavorings and tenderizers. Bouillon cubes. Hot sauce and Tabasco sauce. Premade or packaged marinades. Premade or packaged taco seasonings. Relishes. Regular salad dressings. Salsa. Potato and tortilla chips. Corn chips and puffs. Salted popcorn and  pretzels. Canned or dried soups. Pizza. Frozen entrees and pot pies. Summary  Eating less sodium can help lower your blood pressure, reduce swelling, and protect your heart, liver, and kidneys.  Most people on this plan should limit their sodium intake to 1,500-2,000 mg (milligrams) of sodium each day.  Canned, boxed, and frozen foods are high in sodium. Restaurant foods, fast foods, and pizza are also very high in sodium. You also get sodium by adding salt to food.  Try to cook at home, eat more fresh fruits and vegetables, and eat less fast food, canned, processed, or prepared foods. This information is not intended to replace advice given to you by your health care provider. Make sure you discuss any questions you have with your health care provider. Document Released: 05/14/2002 Document Revised: 11/15/2016 Document Reviewed: 11/15/2016 Elsevier Interactive Patient Education  [employer].

## 2018-01-20 ENCOUNTER — Other Ambulatory Visit: Payer: Self-pay

## 2018-01-20 MED ORDER — FLUOXETINE HCL 20 MG PO CAPS: 20.0000 mg | ORAL_CAPSULE | Freq: Every day | ORAL | 0 refills | Status: DC

## 2018-01-20 NOTE — Telephone Encounter (Signed)
Refilled and patient notified 

## 2018-01-24 ENCOUNTER — Ambulatory Visit: Payer: PPO | Admitting: Internal Medicine

## 2018-02-13 ENCOUNTER — Other Ambulatory Visit: Payer: Self-pay

## 2018-02-13 ENCOUNTER — Other Ambulatory Visit: Payer: Self-pay | Admitting: Internal Medicine

## 2018-02-13 ENCOUNTER — Telehealth: Payer: Self-pay | Admitting: Internal Medicine

## 2018-02-13 MED ORDER — APIXABAN 5 MG PO TABS: ORAL_TABLET | ORAL | 0 refills | Status: DC

## 2018-02-13 NOTE — Telephone Encounter (Signed)
Copied from [place]. Topic: Quick Communication - Rx Refill/Question >> Feb 13, 2018 11:32 AM Synthia Innocent wrote: Medication: ELIQUIS 5 MG TABS tablet, requesting 90 day supply  Has the patient contacted their pharmacy? Yes.     (Agent: If no, request that the patient contact the pharmacy for the refill.)   Preferred Pharmacy (with phone number or street name): [facility] on [address]: Please be advised that RX refills may take up to 3 business days. We ask that you follow-up with your pharmacy.

## 2018-02-19 DIAGNOSIS — L03818 Cellulitis of other sites: Secondary | ICD-10-CM | POA: Diagnosis not present

## 2018-03-13 ENCOUNTER — Telehealth: Payer: Self-pay | Admitting: Cardiovascular Disease

## 2018-03-13 ENCOUNTER — Other Ambulatory Visit: Payer: Self-pay

## 2018-03-13 ENCOUNTER — Other Ambulatory Visit: Payer: Self-pay | Admitting: Internal Medicine

## 2018-03-13 MED ORDER — DILTIAZEM HCL ER BEADS 360 MG PO CP24: 360.0000 mg | ORAL_CAPSULE | Freq: Every day | ORAL | 0 refills | Status: DC

## 2018-03-13 NOTE — Telephone Encounter (Signed)
°*  STAT* If patient is at the pharmacy, call can be transferred to refill team.   1. Which medications need to be refilled? (please list name of each medication and dose if known)  Diltiazem 360 mg    2. Which pharmacy/location (including street and city if local pharmacy) is medication to be sent to? [facility] on [address] in  [place]   3. Do they need a 30 day or 90 day supply? 90 day

## 2018-03-13 NOTE — Telephone Encounter (Signed)
diltiazem (TIAZAC) 360 MG 24 hr capsule 90 capsule 0 03/13/2018    Sig - Route: Take 1 capsule (360 mg total) by mouth daily. - Oral   Sent to pharmacy as: diltiazem (TIAZAC) 360 MG 24 hr capsule   E-Prescribing Status: Receipt confirmed by pharmacy (03/13/2018 1:12 PM EDT)   [facility] [id number] - [place], [place]

## 2018-04-04 ENCOUNTER — Other Ambulatory Visit: Payer: Self-pay | Admitting: Internal Medicine

## 2018-04-13 ENCOUNTER — Telehealth: Payer: Self-pay

## 2018-04-13 NOTE — Telephone Encounter (Signed)
Copied from [place] [id number]. Topic: General - Other >> Apr 13, 2018  2:39 PM Aurelio Brash B wrote: Reason for CRM: Pt is requesting  orders for lab work be placed in epic  so she can go to [facility] for her labs before her apt on 5/14

## 2018-04-14 ENCOUNTER — Telehealth: Payer: Self-pay | Admitting: Internal Medicine

## 2018-04-14 ENCOUNTER — Other Ambulatory Visit: Payer: Self-pay | Admitting: Internal Medicine

## 2018-04-14 DIAGNOSIS — I10 Essential (primary) hypertension: Secondary | ICD-10-CM

## 2018-04-14 DIAGNOSIS — E7800 Pure hypercholesterolemia, unspecified: Secondary | ICD-10-CM

## 2018-04-14 DIAGNOSIS — R739 Hyperglycemia, unspecified: Secondary | ICD-10-CM

## 2018-04-14 DIAGNOSIS — I4819 Other persistent atrial fibrillation: Secondary | ICD-10-CM

## 2018-04-14 NOTE — Telephone Encounter (Signed)
Orders placed for [facility] labs.   

## 2018-04-14 NOTE — Telephone Encounter (Signed)
Please place orders for labs.

## 2018-04-14 NOTE — Telephone Encounter (Signed)
Orders placed for [facility] labs.

## 2018-04-18 ENCOUNTER — Ambulatory Visit (INDEPENDENT_AMBULATORY_CARE_PROVIDER_SITE_OTHER): Payer: PPO

## 2018-04-18 ENCOUNTER — Ambulatory Visit (INDEPENDENT_AMBULATORY_CARE_PROVIDER_SITE_OTHER): Payer: PPO | Admitting: Internal Medicine

## 2018-04-18 DIAGNOSIS — E7800 Pure hypercholesterolemia, unspecified: Secondary | ICD-10-CM

## 2018-04-18 DIAGNOSIS — R0602 Shortness of breath: Secondary | ICD-10-CM

## 2018-04-18 DIAGNOSIS — I481 Persistent atrial fibrillation: Secondary | ICD-10-CM | POA: Diagnosis not present

## 2018-04-18 DIAGNOSIS — Z720 Tobacco use: Secondary | ICD-10-CM | POA: Diagnosis not present

## 2018-04-18 DIAGNOSIS — I739 Peripheral vascular disease, unspecified: Secondary | ICD-10-CM | POA: Diagnosis not present

## 2018-04-18 DIAGNOSIS — Z6841 Body Mass Index (BMI) 40.0 and over, adult: Secondary | ICD-10-CM | POA: Diagnosis not present

## 2018-04-18 DIAGNOSIS — M545 Low back pain, unspecified: Secondary | ICD-10-CM

## 2018-04-18 DIAGNOSIS — M5136 Other intervertebral disc degeneration, lumbar region: Secondary | ICD-10-CM | POA: Diagnosis not present

## 2018-04-18 DIAGNOSIS — R0600 Dyspnea, unspecified: Secondary | ICD-10-CM | POA: Diagnosis not present

## 2018-04-18 DIAGNOSIS — F419 Anxiety disorder, unspecified: Secondary | ICD-10-CM | POA: Diagnosis not present

## 2018-04-18 DIAGNOSIS — I4819 Other persistent atrial fibrillation: Secondary | ICD-10-CM

## 2018-04-18 DIAGNOSIS — I10 Essential (primary) hypertension: Secondary | ICD-10-CM

## 2018-04-18 DIAGNOSIS — R739 Hyperglycemia, unspecified: Secondary | ICD-10-CM | POA: Diagnosis not present

## 2018-04-18 MED ORDER — LEVALBUTEROL TARTRATE 45 MCG/ACT IN AERO: 2.0000 | INHALATION_SPRAY | Freq: Four times a day (QID) | RESPIRATORY_TRACT | 2 refills | Status: DC | PRN

## 2018-04-18 NOTE — Progress Notes (Signed)
Patient ID: Danielle Yates, female   DOB: 1941-05-06, 77 y.o.   MRN: [id number]   Subjective:    Patient ID: Danielle Yates, female    DOB: 11/06/1941, 77 y.o.   MRN: [id number]  HPI  Patient here for a scheduled follow up.  Sees Dr Fletcher Anon for afib.  Just evaluated 01/19/18.  Felt relatively stable.  No changes made. Discussed adding small dose metoprolol.  Monitoring.  Was diagnosed with facial cellulitis 02/19/18.  Took clindamycin.  Resolved.  No chest pain.  Does report some persistent sob - intermittent worsening.  Still smoking.  Has cut down.  Smokes 10 cigarettes per day.  Using mucinex and flonase.  Some low back pain.  Notices more if has been sitting for a while and gets up.  No pain radiating down leg.  No numbness or tingling.      Past Medical History:  Diagnosis Date  . Allergy   . Aortic valve sclerosis    a. TTE 2013: EF 60-65%, no RWMA, Gr1DD, trivial MR, mildly dilated LA 43 mm; b. TTE 08/2017: EF 65-70%, mild LVH, aortic valve sclerosis without stenosis, mildly dilated LA 36 mm, RV size and sys fxn nl  . Arthropathy, unspecified, site unspecified   . Cataract   . Chronic rhinitis   . Disorder of bone and cartilage, unspecified   . Diverticulosis of colon (without mention of hemorrhage)   . Essential hypertension    a. refractory HTN; b. renal US 2013 negative for RAS  . External hemorrhoids without mention of complication   . Medication intolerance   . Obesity, unspecified   . Other abnormal glucose   . Panic disorder without agoraphobia   . Peripheral arterial disease ([place])    a.  Known bilateral common iliac disease-> medical therapy-asymptomatic.  [name] Persistent atrial fibrillation ([place])    a. diagnosed 10/19/17; b. CHADS2VASc 5 (HTN, age x 2, vascular disease, female)-->Eliquis  . Pure hypercholesterolemia   . Tobacco use disorder   . Unspecified vitamin D deficiency    Past Surgical History:  Procedure Laterality Date  . APPENDECTOMY  1985  . CHOLECYSTECTOMY   1985   with open wound  . EYE SURGERY  2011   CATARACTS -  BOTH  . Removed spot on face     [name]  . TONSILLECTOMY AND ADENOIDECTOMY  1946  . VAGINAL HYSTERECTOMY  1982   fibroid and endometriosis; one ovary remaining   Family History  Problem Relation Age of Onset  . Hypertension Sister   . Heart disease Brother   . Hypertension Brother   . Hyperlipidemia Brother   . Aneurysm Mother   . Rheumatic fever Mother        as an adult  . Heart disease Father        MI x 2   Social History   Socioeconomic History  . Marital status: Widowed    Spouse name: Not on file  . Number of children: 0  . Years of education: college  . Highest education level: Not on file  Occupational History  . 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 Financial resource strain: Not on file  . Food insecurity:    Worry: Not on file    Inability: Not on file  . Transportation needs:    Medical: Not on file    Non-medical: Not on file  Tobacco Use  . Smoking status: Current Every Day Smoker  Packs/day: 0.50    Years: 50.00    Pack years: 25.00    Types: Cigarettes  . Smokeless tobacco: Never Used  Substance and Sexual Activity  . Alcohol use: No    Alcohol/week: 0.0 oz  . Drug use: No  . Sexual activity: Never  Lifestyle  . Physical activity:    Days per week: Not on file    Minutes per session: Not on file  . Stress: Not on file  Relationships  . Social connections:    Talks on phone: Not on file    Gets together: Not on file    Attends religious service: Not on file    Active member of club or organization: Not on file    Attends meetings of clubs or organizations: Not on file    Relationship status: Not on file  Other Topics Concern  . Not on file  Social History Narrative   Lives alone. Widowed after 23 years. Not dating. Has dog. Exercise: Walking; light has not been walking as she used to before her 2 dogs passed away. Tries to walk  some. Caffeine: Carbonated beverages, 3 servings/day;Diet coke. 3 a day.      10/02/12  PER PATIENT'S LAVENDER FORM: WALK 5 DAYS/WEEK FOR 30 MINUTES    Outpatient Encounter Medications as of 04/18/2018  Medication Sig  . diltiazem (TIAZAC) 360 MG 24 hr capsule Take 1 capsule (360 mg total) by mouth daily.  [name] ELIQUIS 5 MG TABS tablet TAKE 1 TABLET BY MOUTH TWICE DAILY  . enalapril (VASOTEC) 20 MG tablet Take 1 tablet by mouth in the morning and Take 2 tablets by mouth in the evening  . fluticasone (FLONASE) 50 MCG/ACT nasal spray SHAKE LIQUID AND USE 2 SPRAYS IN EACH NOSTRIL DAILY  . levalbuterol (XOPENEX HFA) 45 MCG/ACT inhaler Inhale 2 puffs into the lungs every 6 (six) hours as needed for wheezing.  . pravastatin (PRAVACHOL) 10 MG tablet Take 1 tablet (10 mg total) by mouth daily.  . Probiotic Product (PROBIOTIC DAILY PO) Take 1 tablet by mouth.  . triamterene-hydrochlorothiazide (MAXZIDE-25) 37.5-25 MG tablet Take 1 tablet by mouth daily.  [name] VITAMIN D, CHOLECALCIFEROL, PO Take by mouth daily.  . [DISCONTINUED] FLUoxetine (PROZAC) 20 MG capsule Take 1 capsule (20 mg total) by mouth daily.   No facility-administered encounter medications on file as of 04/18/2018.     Review of Systems  Constitutional: Negative for appetite change and unexpected weight change.  HENT: Negative for congestion and sinus pressure.   Respiratory: Positive for shortness of breath. Negative for chest tightness.        Some cough and congestion - intermittent.   Cardiovascular: Negative for chest pain, palpitations and leg swelling.  Gastrointestinal: Negative for abdominal pain, diarrhea, nausea and vomiting.  Genitourinary: Negative for difficulty urinating and dysuria.  Musculoskeletal: Negative for joint swelling and myalgias.  Skin: Negative for color change and rash.  Neurological: Negative for dizziness, light-headedness and headaches.  Psychiatric/Behavioral: Negative for agitation and dysphoric mood.         Objective:     Blood pressure rechecked by me:  130/78  Physical Exam  Constitutional: She appears well-developed and well-nourished. No distress.  HENT:  Nose: Nose normal.  Mouth/Throat: Oropharynx is clear and moist.  Neck: Neck supple. No thyromegaly present.  Cardiovascular: Normal rate and regular rhythm.  Pulmonary/Chest: No respiratory distress. She has no wheezes.  Some cough with forced expiration.    Abdominal: Soft. Bowel sounds are normal. There is  no tenderness.  Musculoskeletal: She exhibits no edema or tenderness.  Lymphadenopathy:    She has no cervical adenopathy.  Skin: No rash noted. No erythema.  Psychiatric: She has a normal mood and affect. Her behavior is normal.    BP 136/78 (BP Location: Left Arm, Patient Position: Sitting, Cuff Size: Large)   Pulse 89   Temp 98.5 F (36.9 C) (Oral)   Resp 18   Wt 254 lb (115.2 kg)   SpO2 96%   BMI 44.99 kg/m  Wt Readings from Last 3 Encounters:  04/18/18 254 lb (115.2 kg)  01/19/18 253 lb 12 oz (115.1 kg)  11/22/17 250 lb 4 oz (113.5 kg)     Lab Results  Component Value Date   WBC 6.1 10/17/2017   HGB 14.0 10/17/2017   HCT 41.4 10/17/2017   PLT 244 10/17/2017   GLUCOSE 107 (H) 04/19/2018   CHOL 167 04/19/2018   TRIG 90 04/19/2018   HDL 57 04/19/2018   LDLCALC 92 04/19/2018   ALT 13 04/19/2018   AST 16 04/19/2018   NA 142 04/19/2018   K 4.0 04/19/2018   CL 99 04/19/2018   CREATININE 0.84 04/19/2018   BUN 13 04/19/2018   CO2 26 04/19/2018   TSH 2.370 10/17/2017   HGBA1C 5.7 (H) 04/19/2018       Assessment & Plan:   Problem List Items Addressed This Visit    Anxiety    Stable on prozac.        Atrial fibrillation ([place])    On eliquis.  Stable.  Rate controlled.  Follow.        BMI 40.0-44.9, adult (HCC)    Discussed diet and exercise.  Follow.        Dyspnea    Previous echo revealed normal LV systolic function, mild LVH and mild diastolic dysfunction.  Some sob noted.  She  feels from cardiac standpoint things are stable.  Check cxr.  Given persistent pulmonary issues and concerns, will refer to pulmonary for further evaluation and treatment.        Essential hypertension, benign    Blood pressure under good control.  Continue same medication regimen.  Follow pressures.  Follow metabolic panel.        Hyperglycemia    Low carb diet and exercise.  Follow met b and a1c.        PAD (peripheral artery disease) ([place])    Followed by Dr Fletcher Anon.  Stable.       Pure hypercholesterolemia    On pravastatin.  Low cholesterol diet and exercise.  Follow lipid panel and liver function tests.        Tobacco abuse    Discussed the need to quit smoking.  Has cut down amount smoking.  Plans to continue to decrease.        Relevant Orders   Ambulatory referral to Pulmonology    Other Visit Diagnoses    Midline low back pain without sciatica, unspecified chronicity       Back pain as outlined.  Given persistence, check xray.     Relevant Orders   DG Lumbar Spine 2-3Vclearing (Completed)   SOB (shortness of breath)       Relevant Orders   DG Chest 2 View (Completed)   Ambulatory referral to Pulmonology       Einar Pheasant, MD

## 2018-04-19 ENCOUNTER — Telehealth: Payer: Self-pay | Admitting: Internal Medicine

## 2018-04-19 DIAGNOSIS — I10 Essential (primary) hypertension: Secondary | ICD-10-CM | POA: Diagnosis not present

## 2018-04-19 DIAGNOSIS — E7800 Pure hypercholesterolemia, unspecified: Secondary | ICD-10-CM | POA: Diagnosis not present

## 2018-04-19 DIAGNOSIS — R739 Hyperglycemia, unspecified: Secondary | ICD-10-CM | POA: Diagnosis not present

## 2018-04-19 NOTE — Telephone Encounter (Signed)
Please advise 

## 2018-04-19 NOTE — Telephone Encounter (Signed)
Pt called to report that her rx for Xopenex is needing prior authorization. She stated that the pharmacy said there is a "hold up" onn getting the med filled.

## 2018-04-20 ENCOUNTER — Encounter: Payer: Self-pay | Admitting: Internal Medicine

## 2018-04-20 ENCOUNTER — Other Ambulatory Visit: Payer: Self-pay | Admitting: Internal Medicine

## 2018-04-20 LAB — LIPID PANEL
CHOL/HDL RATIO: 2.9 ratio (ref 0.0–4.4)
Cholesterol, Total: 167 mg/dL (ref 100–199)
HDL: 57 mg/dL (ref >39)
LDL CALC: 92 mg/dL (ref 0–99)
Triglycerides: 90 mg/dL (ref 0–149)
VLDL Cholesterol Cal: 18 mg/dL (ref 5–40)

## 2018-04-20 LAB — HEPATIC FUNCTION PANEL
ALBUMIN: 4.4 g/dL (ref 3.5–4.8)
ALK PHOS: 55 IU/L (ref 39–117)
ALT: 13 IU/L (ref 0–32)
AST: 16 IU/L (ref 0–40)
BILIRUBIN, DIRECT: 0.21 mg/dL (ref 0.00–0.40)
Bilirubin Total: 1 mg/dL (ref 0.0–1.2)
Total Protein: 6.8 g/dL (ref 6.0–8.5)

## 2018-04-20 LAB — BASIC METABOLIC PANEL
BUN/Creatinine Ratio: 15 (ref 12–28)
BUN: 13 mg/dL (ref 8–27)
CO2: 26 mmol/L (ref 20–29)
Calcium: 9.5 mg/dL (ref 8.7–10.3)
Chloride: 99 mmol/L (ref 96–106)
Creatinine, Ser: 0.84 mg/dL (ref 0.57–1.00)
GFR calc Af Amer: 78 mL/min/{1.73_m2} (ref >59)
GFR calc non Af Amer: 68 mL/min/{1.73_m2} (ref >59)
GLUCOSE: 107 mg/dL — AB (ref 65–99)
POTASSIUM: 4 mmol/L (ref 3.5–5.2)
SODIUM: 142 mmol/L (ref 134–144)

## 2018-04-20 LAB — HEMOGLOBIN A1C
Est. average glucose Bld gHb Est-mCnc: 117 mg/dL
Hgb A1c MFr Bld: 5.7 % — ABNORMAL HIGH (ref 4.8–5.6)

## 2018-04-21 NOTE — Telephone Encounter (Signed)
LMTCB. Need to know which pharmacy gave her this info. OK for PEC to speak with patient

## 2018-04-21 NOTE — Telephone Encounter (Addendum)
Relation to pt: self  Call back [phone]   Reason for call:   Patient returned call and stated   [facility] [place], [place] [phone] (Phone) [phone] (Fax)   Informed her PA was required regarding the medication mentioned below, please advise

## 2018-04-24 ENCOUNTER — Encounter: Payer: Self-pay | Admitting: Internal Medicine

## 2018-04-24 NOTE — Assessment & Plan Note (Signed)
Discussed diet and exercise.  Follow.  

## 2018-04-24 NOTE — Assessment & Plan Note (Signed)
On eliquis.  Stable.  Rate controlled.  Follow.

## 2018-04-24 NOTE — Assessment & Plan Note (Signed)
Previous echo revealed normal LV systolic function, mild LVH and mild diastolic dysfunction.  Some sob noted.  She feels from cardiac standpoint things are stable.  Check cxr.  Given persistent pulmonary issues and concerns, will refer to pulmonary for further evaluation and treatment.

## 2018-04-24 NOTE — Assessment & Plan Note (Signed)
Discussed the need to quit smoking.  Has cut down amount smoking.  Plans to continue to decrease.

## 2018-04-24 NOTE — Assessment & Plan Note (Signed)
Low carb diet and exercise.  Follow met b and a1c.   

## 2018-04-24 NOTE — Assessment & Plan Note (Signed)
Blood pressure under good control.  Continue same medication regimen.  Follow pressures.  Follow metabolic panel.   

## 2018-04-24 NOTE — Assessment & Plan Note (Signed)
Stable on prozac.  

## 2018-04-24 NOTE — Assessment & Plan Note (Signed)
Followed by Dr Arida.  Stable.   

## 2018-04-24 NOTE — Assessment & Plan Note (Signed)
On pravastatin.  Low cholesterol diet and exercise.  Follow lipid panel and liver function tests.   

## 2018-04-25 ENCOUNTER — Telehealth: Payer: Self-pay | Admitting: Internal Medicine

## 2018-04-25 NOTE — Telephone Encounter (Signed)
Copied from [place] [phone]. Topic: Quick Communication - See Telephone Encounter >> Apr 25, 2018  3:03 PM Cleaster Corin, [place] wrote: CRM for notification. See Telephone encounter for: 04/25/18.  [facility] calling calling about med. levalbuterol [facility] HFA) 45 MCG/ACT inhaler [[id number]]  [phone] ref. #[id number]

## 2018-04-26 NOTE — Telephone Encounter (Signed)
See other phone note

## 2018-04-27 NOTE — Telephone Encounter (Signed)
Called and submitted PA via phone. PA has been sent to plan.

## 2018-04-28 NOTE — Telephone Encounter (Signed)
Spoke with Richard at [facility]. PA was denied because patient has not tried other drugs that are formulary. He said that Pro-Air would be covered. OK to send in alternative?

## 2018-04-28 NOTE — Telephone Encounter (Signed)
She has afib with rapid ventricular heart rate.  xopenix - tends to not increase heart rate as much as these.  She needs the xopenex - to hopefully avoid increasing her heart rate.  Cardiology agrees.

## 2018-05-03 NOTE — Telephone Encounter (Signed)
PA resubmitted.

## 2018-05-08 ENCOUNTER — Other Ambulatory Visit: Payer: Self-pay | Admitting: Internal Medicine

## 2018-05-25 ENCOUNTER — Encounter: Payer: Self-pay | Admitting: Internal Medicine

## 2018-05-25 ENCOUNTER — Ambulatory Visit (INDEPENDENT_AMBULATORY_CARE_PROVIDER_SITE_OTHER): Payer: PPO | Admitting: Internal Medicine

## 2018-05-25 VITALS — BP 138/86 | HR 122 | Resp 16 | Ht 63.0 in | Wt 252.0 lb

## 2018-05-25 DIAGNOSIS — F17210 Nicotine dependence, cigarettes, uncomplicated: Secondary | ICD-10-CM | POA: Diagnosis not present

## 2018-05-25 DIAGNOSIS — J449 Chronic obstructive pulmonary disease, unspecified: Secondary | ICD-10-CM | POA: Diagnosis not present

## 2018-05-25 DIAGNOSIS — R059 Cough, unspecified: Secondary | ICD-10-CM

## 2018-05-25 DIAGNOSIS — R05 Cough: Secondary | ICD-10-CM

## 2018-05-25 MED ORDER — FLUTICASONE FUROATE-VILANTEROL 200-25 MCG/INH IN AEPB: 1.0000 | INHALATION_SPRAY | Freq: Every day | RESPIRATORY_TRACT | 10 refills | Status: DC

## 2018-05-25 MED ORDER — TIOTROPIUM BROMIDE MONOHYDRATE 1.25 MCG/ACT IN AERS: 2.0000 | INHALATION_SPRAY | Freq: Two times a day (BID) | RESPIRATORY_TRACT | 5 refills | Status: DC

## 2018-05-25 MED ORDER — ALBUTEROL SULFATE HFA 108 (90 BASE) MCG/ACT IN AERS: 2.0000 | INHALATION_SPRAY | Freq: Four times a day (QID) | RESPIRATORY_TRACT | 2 refills | Status: DC | PRN

## 2018-05-25 MED ORDER — NICOTINE 7 MG/24HR TD PT24: 7.0000 mg | MEDICATED_PATCH | TRANSDERMAL | 0 refills | Status: DC

## 2018-05-25 NOTE — Patient Instructions (Signed)
START BREO take daily in Morning SPIRIVA to b e taken twice daily Albuterol 2-4 puffs every 4 hrs as needed

## 2018-05-25 NOTE — Progress Notes (Signed)
Name: Danielle Yates MRN: [id number] DOB: 1941-02-16     CONSULTATION DATE: 6.20.19 REFERRING MD : ARIDA  CHIEF COMPLAINT: cough and SOB  STUDIES:     CXR independently reviewed by Me +flattenend diaphragms Low lung volumes  Office Spirometry Ratio 64$ Fev1 1.5L 79% FEF25/75 0.8L 51%predicted Interpretation Moderate Obstructive airways disease      HISTORY OF PRESENT ILLNESS: 77 year old pleasant white female seen today for chronic shortness of breath chronic dyspnea exertion with ongoing wheezing and coughing years Patient has a history of A. fib on Cardizem therapy  Patient is a longtime smoker approximately 50-pack-year tobacco abuse and continues to smoke currently  patient has no signs and symptoms of infection at this time Patient does not have any signs of heart failure at this time  Patient has never been on inhaler therapy Patient continues to smoke Patient is a retired Estate manager/land agent from [employer]  Her spirometry reveals moderate obstructive airways disease Findings reviewed with patient  Smoking cessation advised I have explained that patient may end up on oxygen therapy and her lung disease may progress if she continues to smoke   Smoking Assessment and Cessation Counseling   Upon further questioning, Patient smokes 1 ppd  I have advised patient to quit/stop smoking as soon as possible due to high risk for multiple medical problems  Patient is willing to quit smoking   I have advised patient that we can assist and have options of Nicotine replacement therapy. I also advised patient on behavioral therapy and can provide oral medication therapy in conjunction with the other therapies  Follow up next Office visit  for assessment of smoking cessation  Smoking cessation counseling advised for 6 minutes      PAST MEDICAL HISTORY :   has a past medical history of Allergy, Aortic valve sclerosis, Arthropathy, unspecified, site unspecified,  Cataract, Chronic rhinitis, Disorder of bone and cartilage, unspecified, Diverticulosis of colon (without mention of hemorrhage), Essential hypertension, External hemorrhoids without mention of complication, Medication intolerance, Obesity, unspecified, Other abnormal glucose, Panic disorder without agoraphobia, Peripheral arterial disease ([place]), Persistent atrial fibrillation ([place]), Pure hypercholesterolemia, Tobacco use disorder, and Unspecified vitamin D deficiency.  has a past surgical history that includes Tonsillectomy and adenoidectomy (1946); Vaginal hysterectomy (1982); Cholecystectomy (1985); Appendectomy (1985); Removed spot on face; and Eye surgery (2011). Prior to Admission medications   Medication Sig Start Date End Date Taking? Authorizing Provider  diltiazem (TIAZAC) 360 MG 24 hr capsule Take 1 capsule (360 mg total) by mouth daily. 03/13/18  Yes Arida, Mertie Clause, MD  ELIQUIS 5 MG TABS tablet TAKE 1 TABLET BY MOUTH TWICE DAILY 05/09/18  Yes Einar Pheasant, MD  enalapril (VASOTEC) 20 MG tablet Take 1 tablet by mouth in the morning and Take 2 tablets by mouth in the evening 04/04/18  Yes Einar Pheasant, MD  FLUoxetine (PROZAC) 20 MG capsule TAKE ONE CAPSULE BY MOUTH DAILY 04/20/18  Yes Einar Pheasant, MD  fluticasone (FLONASE) 50 MCG/ACT nasal spray SHAKE LIQUID AND USE 2 SPRAYS IN EACH NOSTRIL DAILY 01/04/18  Yes Einar Pheasant, MD  levalbuterol [facility] HFA) 45 MCG/ACT inhaler Inhale 2 puffs into the lungs every 6 (six) hours as needed for wheezing. 04/18/18  Yes Einar Pheasant, MD  pravastatin (PRAVACHOL) 10 MG tablet Take 1 tablet (10 mg total) by mouth daily. 02/09/16  Yes Einar Pheasant, MD  Probiotic Product (PROBIOTIC DAILY PO) Take 1 tablet by mouth.   Yes [provider]  triamterene-hydrochlorothiazide (MAXZIDE-25) 37.5-25 MG tablet Take  1 tablet by mouth daily. 12/27/17  Yes Einar Pheasant, MD  VITAMIN D, CHOLECALCIFEROL, PO Take by mouth daily.   Yes [provider]   Allergies  Allergen Reactions  . Amlodipine     Headache/dizziness  . Codeine Nausea Only  . Coreg [Carvedilol]     Headache/dizziness  . Crestor [Rosuvastatin] Other (See Comments)    PER PT MADE ME FEEL" WEIRD"  . Metoprolol     bradycardia  . Statins     Tolerates low dose pravastatin.  . Nickel Rash    FAMILY HISTORY:  family history includes Aneurysm in her mother; Heart disease in her brother and father; Hyperlipidemia in her brother; Hypertension in her brother and sister; Rheumatic fever in her mother. SOCIAL HISTORY:  reports that she has been smoking cigarettes.  She has a 25.00 pack-year smoking history. She has never used smokeless tobacco. She reports that she does not drink alcohol or use drugs.  REVIEW OF SYSTEMS:   Constitutional: Negative for fever, chills, weight loss, malaise/fatigue and diaphoresis.  HENT: Negative for hearing loss, ear pain, nosebleeds, congestion, sore throat, neck pain, tinnitus and ear discharge.   Eyes: Negative for blurred vision, double vision, photophobia, pain, discharge and redness.  Respiratory: + cough, -hemoptysis, -sputum production, +shortness of breath, +wheezing and -stridor.   Cardiovascular: Negative for chest pain, palpitations, orthopnea, claudication, leg swelling and PND.  Gastrointestinal: Negative for heartburn, nausea, vomiting, abdominal pain, diarrhea, constipation, blood in stool and melena.  Genitourinary: Negative for dysuria, urgency, frequency, hematuria and flank pain.  Musculoskeletal: Negative for myalgias, back pain, joint pain and falls.  Skin: Negative for itching and rash.  Neurological: Negative for dizziness, tingling, tremors, sensory change, speech change, focal weakness, seizures, loss of consciousness, weakness and headaches.  Endo/Heme/Allergies: Negative for environmental allergies and polydipsia. Does not bruise/bleed easily.  ALL OTHER ROS ARE NEGATIVE    BP 138/86 (BP  Location: Left Arm, Cuff Size: Large)   Pulse (!) 122   Resp 16   Ht 5\' 3"  (1.6 m)   Wt 252 lb (114.3 kg)   SpO2 96%   BMI 44.64 kg/m   Physical Examination:   GENERAL:NAD, no fevers, chills, no weakness no fatigue HEAD: Normocephalic, atraumatic.  EYES: Pupils equal, round, reactive to light. Extraocular muscles intact. No scleral icterus.  MOUTH: Moist mucosal membrane.   EAR, NOSE, THROAT: Clear without exudates. No external lesions.  NECK: Supple. No thyromegaly. No nodules. No JVD.  PULMONARY:CTA B/L no wheezes, no crackles, no rhonchi CARDIOVASCULAR: S1 and S2. Regular rate and rhythm. No murmurs, rubs, or gallops. No edema.  GASTROINTESTINAL: Soft, nontender, nondistended. No masses. Positive bowel sounds.  MUSCULOSKELETAL: No swelling, clubbing, or edema. Range of motion full in all extremities.  NEUROLOGIC: Cranial nerves II through XII are intact. No gross focal neurological deficits.  SKIN: No ulceration, lesions, rashes, or cyanosis. Skin warm and dry. Turgor intact.  PSYCHIATRIC: Mood, affect within normal limits. The patient is awake, alert and oriented x 3. Insight, judgment intact.      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   Patient  satisfied with Plan of action and management. All questions answered Follow up in 4 weeks for re-assessment Corrin Parker, M.D.  [facility]  Medical Director [place] Director [facility] Cardio-Pulmonary Department

## 2018-06-05 ENCOUNTER — Other Ambulatory Visit: Payer: Self-pay | Admitting: Internal Medicine

## 2018-06-05 ENCOUNTER — Other Ambulatory Visit: Payer: Self-pay | Admitting: Cardiovascular Disease

## 2018-06-15 DIAGNOSIS — D485 Neoplasm of uncertain behavior of skin: Secondary | ICD-10-CM | POA: Diagnosis not present

## 2018-06-15 DIAGNOSIS — Z1283 Encounter for screening for malignant neoplasm of skin: Secondary | ICD-10-CM | POA: Diagnosis not present

## 2018-06-15 DIAGNOSIS — L814 Other melanin hyperpigmentation: Secondary | ICD-10-CM | POA: Diagnosis not present

## 2018-06-15 DIAGNOSIS — C44519 Basal cell carcinoma of skin of other part of trunk: Secondary | ICD-10-CM | POA: Diagnosis not present

## 2018-06-15 DIAGNOSIS — L739 Follicular disorder, unspecified: Secondary | ICD-10-CM | POA: Diagnosis not present

## 2018-06-15 DIAGNOSIS — C4491 Basal cell carcinoma of skin, unspecified: Secondary | ICD-10-CM

## 2018-06-15 DIAGNOSIS — D225 Melanocytic nevi of trunk: Secondary | ICD-10-CM | POA: Diagnosis not present

## 2018-06-15 DIAGNOSIS — L821 Other seborrheic keratosis: Secondary | ICD-10-CM | POA: Diagnosis not present

## 2018-06-15 HISTORY — DX: Basal cell carcinoma of skin, unspecified: C44.91

## 2018-06-19 ENCOUNTER — Telehealth: Payer: Self-pay | Admitting: Internal Medicine

## 2018-06-19 MED ORDER — PRAVASTATIN SODIUM 10 MG PO TABS: 10.0000 mg | ORAL_TABLET | Freq: Every day | ORAL | 3 refills | Status: DC

## 2018-06-19 NOTE — Telephone Encounter (Signed)
Req. Pravachol refill; last refill 02/09/16; #90, Refill x 3  Last office visit; 04/24/18  Last lipid panel 04/19/18  PCP: Dr. Nicki Reaper  Pharmacy: [facility] on [address] ; [place]  Medication refilled per protocol

## 2018-06-19 NOTE — Telephone Encounter (Signed)
Copied from [place] [id number]. Topic: Quick Communication - Rx Refill/Question >> Jun 19, 2018  9:44 AM Neva Seat wrote: pravastatin (PRAVACHOL) 10 MG tablet  Pt is out since Sunday.  Needing refills asap  [facility] [address] [place], [place] - [address] [place] [address] Phone: [phone] Fax: [phone]

## 2018-06-27 ENCOUNTER — Ambulatory Visit: Payer: PPO | Admitting: Internal Medicine

## 2018-06-27 ENCOUNTER — Encounter: Payer: Self-pay | Admitting: Internal Medicine

## 2018-06-27 VITALS — BP 132/86 | HR 94 | Ht 63.0 in | Wt 250.0 lb

## 2018-06-27 DIAGNOSIS — J449 Chronic obstructive pulmonary disease, unspecified: Secondary | ICD-10-CM | POA: Diagnosis not present

## 2018-06-27 NOTE — Patient Instructions (Signed)
Check ONO and 6MWT Continue inhalers as prescribed  STOP SMOKING!!

## 2018-06-27 NOTE — Progress Notes (Signed)
Name: Danielle Yates MRN: [id number] DOB: November 26, 1941     CONSULTATION DATE: 6.20.19 REFERRING MD : ARIDA  CHIEF COMPLAINT: cough and SOB  STUDIES:     CXR independently reviewed by Me +flattenend diaphragms Low lung volumes  Office Spirometry Ratio 64$ Fev1 1.5L 79% FEF25/75 0.8L 51%predicted Interpretation Moderate Obstructive airways disease      HISTORY OF PRESENT ILLNESS: Patient is a longtime smoker approximately 50-pack-year tobacco abuse and continues to smoke currently  patient has no signs and symptoms of infection at this time Patient does not have any signs of heart failure at this time  Patient feels better with BREO and [place]   Patient continues to smoke Patient is a retired Estate manager/land agent from [employer]  Her spirometry reveals moderate obstructive airways disease Findings reviewed with patient  Smoking cessation advised I have explained that patient may end up on oxygen therapy and her lung disease may progress if she continues to smoke    Smoking Assessment and Cessation Counseling Upon further questioning, Patient smokes 1 ppd  I have advised patient to quit/stop smoking as soon as possible due to high risk for multiple medical problems  Patient is willing to quit smoking   I have advised patient that we can assist and have options of Nicotine replacement therapy. I also advised patient on behavioral therapy and can provide oral medication therapy in conjunction with the other therapies  Follow up next Office visit  for assessment of smoking cessation  Smoking cessation counseling advised for 4 minutes      REVIEW OF SYSTEMS:   Constitutional: Negative for fever, chills, weight loss, malaise/fatigue and diaphoresis.  HENT: Negative for hearing loss, ear pain, nosebleeds, congestion, sore throat, neck pain, tinnitus and ear discharge.   Eyes: Negative for blurred vision, double vision, photophobia, pain, discharge and redness.    Respiratory: - cough, -hemoptysis, -sputum production, +shortness of breath, -wheezing and -stridor.   Cardiovascular: Negative for chest pain, palpitations, orthopnea, claudication, leg swelling and PND.  Gastrointestinal: Negative for heartburn, nausea, vomiting, abdominal pain, diarrhea, constipation, blood in stool and melena.   ALL OTHER ROS ARE NEGATIVE    BP 132/86 (BP Location: Left Arm, Cuff Size: Large)   Pulse 94   Ht 5\' 3"  (1.6 m)   Wt 250 lb (113.4 kg)   SpO2 99%   BMI 44.29 kg/m   Physical Examination:   GENERAL:NAD, no fevers, chills, no weakness no fatigue HEAD: Normocephalic, atraumatic.  EYES: Pupils equal, round, reactive to light. Extraocular muscles intact. No scleral icterus.  MOUTH: Moist mucosal membrane.   EAR, NOSE, THROAT: Clear without exudates. No external lesions.  NECK: Supple. No thyromegaly. No nodules. No JVD.  PULMONARY:CTA B/L no wheezes, no crackles, no rhonchi CARDIOVASCULAR: S1 and S2. Regular rate and rhythm. No murmurs, rubs, or gallops. No edema.     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 All questions answered Follow up in 6 months   Danielle Yates Patricia Pesa, M.D.  [facility]  Medical Director [place] Director [facility] Cardio-Pulmonary Department

## 2018-07-03 ENCOUNTER — Other Ambulatory Visit: Payer: Self-pay

## 2018-07-03 ENCOUNTER — Telehealth: Payer: Self-pay

## 2018-07-03 ENCOUNTER — Telehealth: Payer: Self-pay | Admitting: Internal Medicine

## 2018-07-03 MED ORDER — TRIAMTERENE-HCTZ 37.5-25 MG PO TABS: 1.0000 | ORAL_TABLET | Freq: Every day | ORAL | 1 refills | Status: DC

## 2018-07-03 NOTE — Telephone Encounter (Signed)
Copied from [place] [id number]. Topic: Inquiry >> Jul 03, 2018  2:32 PM Pricilla Handler wrote: Reason for CRM: Patient called requesting refills of triamterene-hydrochlorothiazide (MAXZIDE-25) 37.5-25 MG tablet and FLUoxetine (PROZAC) 20 MG capsule. Patient's preferred pharmacy is [id number] [address], [place] [phone] (Phone)  [phone] (Fax).       Thank You!!!

## 2018-07-03 NOTE — Telephone Encounter (Signed)
Copied from [place] [id number]. Topic: Inquiry >> Jul 03, 2018  2:32 PM Pricilla Handler wrote: Reason for CRM: Patient called requesting a refill of triamterene-hydrochlorothiazide (MAXZIDE-25) 37.5-25 MG tablet. Patient's preferred pharmacy is [id number], [place] [phone] (Phone)  [phone] (Fax).       Thank You!!!

## 2018-07-04 ENCOUNTER — Other Ambulatory Visit: Payer: Self-pay

## 2018-07-04 MED ORDER — FLUOXETINE HCL 20 MG PO CAPS: 20.0000 mg | ORAL_CAPSULE | Freq: Every day | ORAL | 0 refills | Status: DC

## 2018-07-04 NOTE — Telephone Encounter (Signed)
rx filled and left message letting patient know.

## 2018-07-05 ENCOUNTER — Ambulatory Visit (INDEPENDENT_AMBULATORY_CARE_PROVIDER_SITE_OTHER): Payer: PPO | Admitting: *Deleted

## 2018-07-05 DIAGNOSIS — R0600 Dyspnea, unspecified: Secondary | ICD-10-CM | POA: Diagnosis not present

## 2018-07-05 NOTE — Progress Notes (Signed)
SIX MIN WALK 07/05/2018  Medications Eliquis 5 mg/Ditiazem 360 mg/Enalapril 20 mg/Breo 200/Sprivia Resp 1.25/Triamterene 37.5 mg  Supplimental Oxygen during Test? (L/min) No  Laps 4  Partial Lap (in Meters) 12  Baseline BP (sitting) 134/84  Baseline Heartrate 95  Baseline Dyspnea (Borg Scale) 0  Baseline Fatigue (Borg Scale) 1  Baseline SPO2 97  BP (sitting) 162/92  Heartrate 150  Dyspnea (Borg Scale) 2  Fatigue (Borg Scale) 2  SPO2 94  BP (sitting) 142/88  Heartrate 99  SPO2 98  Stopped or Paused before Six Minutes No  Distance Completed 204  Tech Comments: Pt c/o back pain before walk began: She walked at her normal pace without any other complaints   SMW performed today.

## 2018-07-06 DIAGNOSIS — C44519 Basal cell carcinoma of skin of other part of trunk: Secondary | ICD-10-CM | POA: Diagnosis not present

## 2018-07-12 ENCOUNTER — Encounter: Payer: Self-pay | Admitting: Internal Medicine

## 2018-07-14 DIAGNOSIS — J449 Chronic obstructive pulmonary disease, unspecified: Secondary | ICD-10-CM | POA: Diagnosis not present

## 2018-07-18 ENCOUNTER — Other Ambulatory Visit: Payer: Self-pay | Admitting: *Deleted

## 2018-07-18 ENCOUNTER — Telehealth: Payer: Self-pay | Admitting: Internal Medicine

## 2018-07-18 DIAGNOSIS — J449 Chronic obstructive pulmonary disease, unspecified: Secondary | ICD-10-CM

## 2018-07-18 NOTE — Telephone Encounter (Signed)
LMTCB  Results of ONO patient needs 2 liters of oxygen at night. Orders previously placed.[facility] Kitchen[facility].

## 2018-07-19 NOTE — Telephone Encounter (Signed)
Pt informed of results. Nothing further needed.  Please call [employer] to let them know that pt needs to schedule time to get oxygen.

## 2018-07-19 NOTE — Telephone Encounter (Signed)
New Message  Pt verbalized for someone to give her a call in regards to her results and O2 being ordered.

## 2018-07-19 NOTE — Telephone Encounter (Signed)
Contacted [employer] and spoke with Magda Paganini. Danielle Yates that patient is ready to move forward with 02 order, to please call patient back to schedule delivery of Oxygen.  Nothing else needed at this time. Rhonda J Cobb

## 2018-07-20 DIAGNOSIS — J449 Chronic obstructive pulmonary disease, unspecified: Secondary | ICD-10-CM | POA: Diagnosis not present

## 2018-07-21 ENCOUNTER — Encounter: Payer: Self-pay | Admitting: Cardiovascular Disease

## 2018-07-21 ENCOUNTER — Ambulatory Visit (INDEPENDENT_AMBULATORY_CARE_PROVIDER_SITE_OTHER): Payer: PPO | Admitting: Cardiovascular Disease

## 2018-07-21 VITALS — BP 134/71 | HR 108 | Ht 63.0 in | Wt 243.2 lb

## 2018-07-21 DIAGNOSIS — I10 Essential (primary) hypertension: Secondary | ICD-10-CM | POA: Diagnosis not present

## 2018-07-21 DIAGNOSIS — I481 Persistent atrial fibrillation: Secondary | ICD-10-CM | POA: Diagnosis not present

## 2018-07-21 DIAGNOSIS — I4819 Other persistent atrial fibrillation: Secondary | ICD-10-CM

## 2018-07-21 DIAGNOSIS — Z720 Tobacco use: Secondary | ICD-10-CM | POA: Diagnosis not present

## 2018-07-21 DIAGNOSIS — I739 Peripheral vascular disease, unspecified: Secondary | ICD-10-CM | POA: Diagnosis not present

## 2018-07-21 DIAGNOSIS — E785 Hyperlipidemia, unspecified: Secondary | ICD-10-CM

## 2018-07-21 MED ORDER — ENALAPRIL MALEATE 20 MG PO TABS: 20.0000 mg | ORAL_TABLET | Freq: Two times a day (BID) | ORAL | 2 refills | Status: DC

## 2018-07-21 MED ORDER — METOPROLOL TARTRATE 25 MG PO TABS: 25.0000 mg | ORAL_TABLET | Freq: Two times a day (BID) | ORAL | 3 refills | Status: DC

## 2018-07-21 NOTE — Progress Notes (Signed)
Cardiology Office Note   Date:  07/21/2018   ID:  Danielle Yates, Danielle Yates May 20, 1941, MRN [id number]  PCP:  Einar Pheasant, MD  Cardiologist:   Kathlyn Sacramento, MD   Chief Complaint  Patient presents with  . other    6 month f/u no complaints today denies chest pain/sob/edema or BP issues. Meds reviewed verbally with pt.      History of Present Illness: Danielle Yates is a 77 y.o. female who presents for a followup visit regarding persistent atrial fibrillation, refractory hypertension and peripheral arterial disease. She did not tolerate metoprolol due to bradycardia and fatigue. She did not tolerate amlodipine and carvedilol due to headache and dizziness.   She underwent renal duplex ultrasound in 2013 which showed no evidence of renal artery stenosis.   She is known to have bilateral common iliac artery disease with peak velocity around 300 with normal ABI bilaterally. She is being treated medically due to lack of significant claudication. She is taking pravastatin for hyperlipidemia. She did not tolerate other statins in the past.  She was diagnosed with atrial fibrillation in November of 2018.  She was started on Eliquis for anticoagulation and diltiazem for rate control.  She was completely asymptomatic. Echocardiogram in September 2018 showed normal LV systolic function, aortic sclerosis without significant stenosis and mildly dilated left atrium.  She has been doing reasonably well with no chest pain or worsening dyspnea.  No palpitations.  She continues to be in atrial fibrillation and ventricular rate is high.  She lost 10 pounds over the last 6 months with improved diet.  She is also trying to quit smoking gradually.  She denies leg claudication.  Past Medical History:  Diagnosis Date  . Allergy   . Aortic valve sclerosis    a. TTE 2013: EF 60-65%, no RWMA, Gr1DD, trivial MR, mildly dilated LA 43 mm; b. TTE 08/2017: EF 65-70%, mild LVH, aortic valve sclerosis without  stenosis, mildly dilated LA 36 mm, RV size and sys fxn nl  . Arthropathy, unspecified, site unspecified   . Cataract   . Chronic rhinitis   . Disorder of bone and cartilage, unspecified   . Diverticulosis of colon (without mention of hemorrhage)   . Essential hypertension    a. refractory HTN; b. renal US 2013 negative for RAS  . External hemorrhoids without mention of complication   . Medication intolerance   . Obesity, unspecified   . Other abnormal glucose   . Panic disorder without agoraphobia   . Peripheral arterial disease ([place])    a.  Known bilateral common iliac disease-> medical therapy-asymptomatic.  [name] Persistent atrial fibrillation ([place])    a. diagnosed 10/19/17; b. CHADS2VASc 5 (HTN, age x 2, vascular disease, female)-->Eliquis  . Pure hypercholesterolemia   . Tobacco use disorder   . Unspecified vitamin D deficiency     Past Surgical History:  Procedure Laterality Date  . APPENDECTOMY  1985  . CHOLECYSTECTOMY  1985   with open wound  . EYE SURGERY  2011   CATARACTS -  BOTH  . Removed spot on face     [name]  . TONSILLECTOMY AND ADENOIDECTOMY  1946  . VAGINAL HYSTERECTOMY  1982   fibroid and endometriosis; one ovary remaining     Current Outpatient Medications  Medication Sig Dispense Refill  . albuterol (PROVENTIL HFA;VENTOLIN HFA) 108 (90 Base) MCG/ACT inhaler Inhale 2 puffs into the lungs every 6 (six) hours as needed for wheezing or shortness of breath. 1  Inhaler 2  . diltiazem (TIAZAC) 360 MG 24 hr capsule TAKE 1 CAPSULE(360 MG) BY MOUTH DAILY 90 capsule 0  . ELIQUIS 5 MG TABS tablet TAKE 1 TABLET BY MOUTH TWICE DAILY 60 tablet 2  . enalapril (VASOTEC) 20 MG tablet Take 1 tablet by mouth in the morning and Take 2 tablets by mouth in the evening 270 tablet 2  . FLUoxetine (PROZAC) 20 MG capsule Take 1 capsule (20 mg total) by mouth daily. 90 capsule 0  . fluticasone (FLONASE) 50 MCG/ACT nasal spray SHAKE LIQUID AND USE 2 SPRAYS IN EACH NOSTRIL DAILY 48 g 2    . fluticasone furoate-vilanterol (BREO ELLIPTA) 200-25 MCG/INH AEPB Inhale 1 puff into the lungs daily. 1 each 10  . pravastatin (PRAVACHOL) 10 MG tablet Take 1 tablet (10 mg total) by mouth daily. 90 tablet 3  . Probiotic Product (PROBIOTIC DAILY PO) Take 1 tablet by mouth.    . Tiotropium Bromide Monohydrate (SPIRIVA RESPIMAT) 1.25 MCG/ACT AERS Inhale 2 puffs into the lungs 2 (two) times daily. 1 Inhaler 5  . triamterene-hydrochlorothiazide (MAXZIDE-25) 37.5-25 MG tablet Take 1 tablet by mouth daily. 90 tablet 1  . VITAMIN D, CHOLECALCIFEROL, PO Take by mouth daily.     No current facility-administered medications for this visit.     Allergies:   Amlodipine; Codeine; Coreg [carvedilol]; Crestor [rosuvastatin]; Metoprolol; Statins; and Nickel    Social History:  The patient  reports that she has been smoking cigarettes. She has a 25.00 pack-year smoking history. She has never used smokeless tobacco. She reports that she does not drink alcohol or use drugs.   Family History:  The patient's family history includes Aneurysm in her mother; Heart disease in her brother and father; Hyperlipidemia in her brother; Hypertension in her brother and sister; Rheumatic fever in her mother.    ROS:  Please see the history of present illness.   Otherwise, review of systems are positive for none.   All other systems are reviewed and negative.    PHYSICAL EXAM: VS:  BP 134/71 (BP Location: Left Arm, Patient Position: Sitting, Cuff Size: Large)   Pulse (!) 108   Ht 5\' 3"  (1.6 m)   Wt 243 lb 4 oz (110.3 kg)   BMI 43.09 kg/m  , BMI Body mass index is 43.09 kg/m. GEN: Well nourished, well developed, in no acute distress  HEENT: normal  Neck: no JVD, carotid bruits, or masses Cardiac: Irregularly irregular; 2/6 systolic ejection murmur in the aortic area. No rubs, gallops or edema  Respiratory:  clear to auscultation bilaterally, normal work of breathing GI: soft, nontender, nondistended, + BS MS: no  deformity or atrophy  Skin: warm and dry, no rash Neuro:  Strength and sensation are intact Psych: euthymic mood, full affect Dorsalis pedis is normal bilaterally.  EKG:  EKG is ordered today. The ekg ordered today demonstrates atrial fibrillation with low voltage.  Ventricular rate of 108 bpm.   Recent Labs: 10/17/2017: Hemoglobin 14.0; Platelets 244; TSH 2.370 04/19/2018: ALT 13; BUN 13; Creatinine, Ser 0.84; Potassium 4.0; Sodium 142    Lipid Panel    Component Value Date/Time   CHOL 167 04/19/2018 0738   TRIG 90 04/19/2018 0738   HDL 57 04/19/2018 0738   CHOLHDL 2.9 04/19/2018 0738   LDLCALC 92 04/19/2018 0738      Wt Readings from Last 3 Encounters:  07/21/18 243 lb 4 oz (110.3 kg)  06/27/18 250 lb (113.4 kg)  05/25/18 252 lb (114.3 kg)  No flowsheet data found.    ASSESSMENT AND PLAN:  1.  Persistent atrial fibrillation: Ventricular rate continues to be not optimally controlled in spite of maximal dose diltiazem.  Thus, I elected to add metoprolol 25 mg twice daily.  The patient has elected for no rhythm control strategy given lack of symptoms related to A. fib.  Continue anticoagulation with Eliquis.  I reviewed her recent labs which were unremarkable.  S  2. Peripheral arterial disease: Mainly with disease affecting the common iliac arteries bilaterally. The patient has palpable distal pulses and has no significant claudication at the present time. Recommend continuing medical therapy.  3.  Aortic sclerosis: No significant stenosis in most recent echocardiogram in September of last year  4. Essential hypertension: Blood pressure is reasonably controlled on current medications.  Given the addition of metoprolol, I elected to decrease enalapril to 20 mg twice daily.  5.  Hyperlipidemia: Currently on low-dose pravastatin due to intolerance to other statins.  6. Tobacco use: I discussed with her again the importance of smoking cessation.  7.  Morbid  obesity: She has improved her lifestyle and diet over the last 6 months with 10 pounds weight loss.   Disposition:   FU with me in 6 months.  Signed,  Kathlyn Sacramento, MD  07/21/2018 7:59 AM    [place]

## 2018-07-21 NOTE — Patient Instructions (Signed)
Medication Instructions: START  Metoprolol 25 mg twice daily DECREASE the Enalapril to 20 mg twice daily  If you need a refill on your cardiac medications before your next appointment, please call your pharmacy.   Follow-Up: Your physician wants you to follow-up in 6 months with Dr. Fletcher Anon. You will receive a reminder letter in the mail two months in advance. If you don't receive a letter, please call our office at [phone] to schedule this follow-up appointment.  Thank you for choosing [facility] at [facility]!

## 2018-07-27 ENCOUNTER — Telehealth: Payer: Self-pay | Admitting: Cardiovascular Disease

## 2018-07-27 NOTE — Telephone Encounter (Signed)
Returned the call to the patient. She stated that she started Metoprolol 25 mg twice daily on Monday as prescribed at her office visit on 8/16. On Monday she took the Metoprolol in the morning and started feeling weak and her legs felt like "water." She stated that she felt like she could not function. She did not take her evening dose.  The next day she took 12.5 mg of the Metoprolol and had the same symptoms. She has not taken Metoprolol since.   She has been monitoring her heart rate and says it has not been over 82 bpm since being off the Metoprolol. She has not checked her blood pressure.   She feels like she does not need the Metoprolol and would rather not take it. She stated that she felt like her rate was higher at her office visit because her medication had not worked yet. She has been advised that the symptoms from the medication could take up to a week to dissipate, Message has been routed to the provider for recommendation.

## 2018-07-27 NOTE — Telephone Encounter (Signed)
Pt states she is unable to take the Metoprolol, states her legs feel like "water" and her HR dropped to 50. Please call to discuss. States she has been monitoring her HR and the highest this week was in the 80s

## 2018-07-28 NOTE — Telephone Encounter (Signed)
Okay she can stay off metoprolol and monitor heart rate.

## 2018-07-28 NOTE — Telephone Encounter (Signed)
Patient made aware and verbalized her understanding. 

## 2018-08-15 ENCOUNTER — Telehealth: Payer: Self-pay

## 2018-08-15 ENCOUNTER — Telehealth: Payer: Self-pay | Admitting: Internal Medicine

## 2018-08-15 DIAGNOSIS — R739 Hyperglycemia, unspecified: Secondary | ICD-10-CM

## 2018-08-15 DIAGNOSIS — E7800 Pure hypercholesterolemia, unspecified: Secondary | ICD-10-CM

## 2018-08-15 DIAGNOSIS — I10 Essential (primary) hypertension: Secondary | ICD-10-CM

## 2018-08-15 NOTE — Telephone Encounter (Signed)
Pt c/o medication issue:  1. Name of Medication: O2 2 L per [place]   2. How are you currently taking this medication (dosage and times per day)? 2 L per [place]   3. Are you having a reaction (difficulty breathing--STAT)?  No   4. What is your medication issue?  Patient wants to dc o2 because it is causing nose to be dry and bleed patient is having nose bleeds because she is on eliquis will talk to md at next ov but for right now wants this dc

## 2018-08-15 NOTE — Telephone Encounter (Signed)
I have placed the order for the labs.  She can go by and have fasting labs drawn.

## 2018-08-15 NOTE — Telephone Encounter (Signed)
Copied from [place] [id number]. Topic: Inquiry >> Aug 15, 2018  1:00 PM Selinda Flavin B, [place] wrote: Reason for CRM: Patient would like to know if Dr Nicki Reaper can put in lab orders so she can get labs drawn at [facility] before her appointment on 08/22/18? Please advise.

## 2018-08-15 NOTE — Telephone Encounter (Signed)
Patient would like labs drawn at [facility]

## 2018-08-15 NOTE — Telephone Encounter (Signed)
Order placed for f/u labs.  

## 2018-08-16 NOTE — Telephone Encounter (Signed)
Patient is aware 

## 2018-08-17 DIAGNOSIS — R739 Hyperglycemia, unspecified: Secondary | ICD-10-CM | POA: Diagnosis not present

## 2018-08-17 DIAGNOSIS — E7800 Pure hypercholesterolemia, unspecified: Secondary | ICD-10-CM | POA: Diagnosis not present

## 2018-08-17 DIAGNOSIS — I10 Essential (primary) hypertension: Secondary | ICD-10-CM | POA: Diagnosis not present

## 2018-08-18 LAB — BASIC METABOLIC PANEL
BUN/Creatinine Ratio: 19 (ref 12–28)
BUN: 19 mg/dL (ref 8–27)
CALCIUM: 9.5 mg/dL (ref 8.7–10.3)
CO2: 25 mmol/L (ref 20–29)
CREATININE: 1 mg/dL (ref 0.57–1.00)
Chloride: 99 mmol/L (ref 96–106)
GFR calc Af Amer: 63 mL/min/{1.73_m2} (ref >59)
GFR, EST NON AFRICAN AMERICAN: 55 mL/min/{1.73_m2} — AB (ref >59)
Glucose: 106 mg/dL — ABNORMAL HIGH (ref 65–99)
Potassium: 4 mmol/L (ref 3.5–5.2)
Sodium: 142 mmol/L (ref 134–144)

## 2018-08-18 LAB — LIPID PANEL
CHOLESTEROL TOTAL: 162 mg/dL (ref 100–199)
Chol/HDL Ratio: 2.7 ratio (ref 0.0–4.4)
HDL: 61 mg/dL (ref >39)
LDL Calculated: 86 mg/dL (ref 0–99)
Triglycerides: 75 mg/dL (ref 0–149)
VLDL Cholesterol Cal: 15 mg/dL (ref 5–40)

## 2018-08-18 LAB — HEPATIC FUNCTION PANEL
ALK PHOS: 58 IU/L (ref 39–117)
ALT: 20 IU/L (ref 0–32)
AST: 23 IU/L (ref 0–40)
Albumin: 4.3 g/dL (ref 3.5–4.8)
Bilirubin Total: 1.1 mg/dL (ref 0.0–1.2)
Bilirubin, Direct: 0.27 mg/dL (ref 0.00–0.40)
TOTAL PROTEIN: 6.8 g/dL (ref 6.0–8.5)

## 2018-08-18 LAB — HEMOGLOBIN A1C
Est. average glucose Bld gHb Est-mCnc: 114 mg/dL
Hgb A1c MFr Bld: 5.6 % (ref 4.8–5.6)

## 2018-08-22 ENCOUNTER — Ambulatory Visit (INDEPENDENT_AMBULATORY_CARE_PROVIDER_SITE_OTHER): Payer: PPO | Admitting: Internal Medicine

## 2018-08-22 ENCOUNTER — Encounter: Payer: Self-pay | Admitting: Internal Medicine

## 2018-08-22 VITALS — BP 130/72 | HR 78 | Temp 98.4°F | Resp 18 | Wt 240.0 lb

## 2018-08-22 DIAGNOSIS — F419 Anxiety disorder, unspecified: Secondary | ICD-10-CM

## 2018-08-22 DIAGNOSIS — E7800 Pure hypercholesterolemia, unspecified: Secondary | ICD-10-CM | POA: Diagnosis not present

## 2018-08-22 DIAGNOSIS — I10 Essential (primary) hypertension: Secondary | ICD-10-CM

## 2018-08-22 DIAGNOSIS — Z720 Tobacco use: Secondary | ICD-10-CM

## 2018-08-22 DIAGNOSIS — Z1231 Encounter for screening mammogram for malignant neoplasm of breast: Secondary | ICD-10-CM | POA: Diagnosis not present

## 2018-08-22 DIAGNOSIS — R739 Hyperglycemia, unspecified: Secondary | ICD-10-CM

## 2018-08-22 DIAGNOSIS — I4819 Other persistent atrial fibrillation: Secondary | ICD-10-CM

## 2018-08-22 DIAGNOSIS — I481 Persistent atrial fibrillation: Secondary | ICD-10-CM

## 2018-08-22 DIAGNOSIS — Z23 Encounter for immunization: Secondary | ICD-10-CM | POA: Diagnosis not present

## 2018-08-22 DIAGNOSIS — I739 Peripheral vascular disease, unspecified: Secondary | ICD-10-CM | POA: Diagnosis not present

## 2018-08-22 DIAGNOSIS — Z6841 Body Mass Index (BMI) 40.0 and over, adult: Secondary | ICD-10-CM

## 2018-08-22 DIAGNOSIS — Z1239 Encounter for other screening for malignant neoplasm of breast: Secondary | ICD-10-CM

## 2018-08-22 NOTE — Progress Notes (Addendum)
Patient ID: Danielle Yates, female   DOB: [birth year], 77 y.o.   MRN: [id number]   Subjective:    Patient ID: Danielle Yates, female    DOB: [birth year], 77 y.o.   MRN: [id number]  HPI  Patient here for a scheduled follow up. She reports she is doing relatively well.  No chest pain.  Sees Dr Mortimer Fries.  Using inhalers. No wheezing.  Required oxygen.  Has been trying to watch her diet.  Has cut out bread and sweets.  Energy is better.  Feels better.  No chest pain.  No acid reflux.  On abdominal pain.  Bowels moving. Has lost weight.  Previous left ankle swelling - better.     Past Medical History:  Diagnosis Date  . Allergy   . Aortic valve sclerosis    a. TTE 2013: EF 60-65%, no RWMA, Gr1DD, trivial MR, mildly dilated LA 43 mm; b. TTE 08/2017: EF 65-70%, mild LVH, aortic valve sclerosis without stenosis, mildly dilated LA 36 mm, RV size and sys fxn nl  . Arthropathy, unspecified, site unspecified   . Cataract   . Chronic rhinitis   . Disorder of bone and cartilage, unspecified   . Diverticulosis of colon (without mention of hemorrhage)   . Essential hypertension    a. refractory HTN; b. renal US 2013 negative for RAS  . External hemorrhoids without mention of complication   . Medication intolerance   . Obesity, unspecified   . Other abnormal glucose   . Panic disorder without agoraphobia   . Peripheral arterial disease ([place])    a.  Known bilateral common iliac disease-> medical therapy-asymptomatic.  [name] Persistent atrial fibrillation ([place])    a. diagnosed 10/19/17; b. CHADS2VASc 5 (HTN, age x 2, vascular disease, female)-->Eliquis  . Pure hypercholesterolemia   . Tobacco use disorder   . Unspecified vitamin D deficiency    Past Surgical History:  Procedure Laterality Date  . APPENDECTOMY  1985  . CHOLECYSTECTOMY  1985   with open wound  . EYE SURGERY  2011   CATARACTS -  BOTH  . Removed spot on face     [name]  . TONSILLECTOMY AND ADENOIDECTOMY  1946  . VAGINAL HYSTERECTOMY   1982   fibroid and endometriosis; one ovary remaining   Family History  Problem Relation Age of Onset  . Hypertension Sister   . Heart disease Brother   . Hypertension Brother   . Hyperlipidemia Brother   . Aneurysm Mother   . Rheumatic fever Mother        as an adult  . Heart disease Father        MI x 2   Social History   Socioeconomic History  . Marital status: Widowed    Spouse name: Not on file  . Number of children: 0  . Years of education: college  . Highest education level: Not on file  Occupational History  . 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 Financial resource strain: Not on file  . Food insecurity:    Worry: Not on file    Inability: Not on file  . Transportation needs:    Medical: Not on file    Non-medical: Not on file  Tobacco Use  . Smoking status: Current Every Day Smoker    Packs/day: 0.50    Years: 50.00    Pack years: 25.00    Types: Cigarettes  . Smokeless tobacco: Never Used  Substance  and Sexual Activity  . Alcohol use: No    Alcohol/week: 0.0 standard drinks  . Drug use: No  . Sexual activity: Never  Lifestyle  . Physical activity:    Days per week: Not on file    Minutes per session: Not on file  . Stress: Not on file  Relationships  . Social connections:    Talks on phone: Not on file    Gets together: Not on file    Attends religious service: Not on file    Active member of club or organization: Not on file    Attends meetings of clubs or organizations: Not on file    Relationship status: Not on file  Other Topics Concern  . Not on file  Social History Narrative   Lives alone. Widowed after 23 years. Not dating. Has dog. Exercise: Walking; light has not been walking as she used to before her 2 dogs passed away. Tries to walk some. Caffeine: Carbonated beverages, 3 servings/day;Diet coke. 3 a day.      10/02/12  PER PATIENT'S LAVENDER FORM: WALK 5 DAYS/WEEK FOR 30 MINUTES     Outpatient Encounter Medications as of 08/22/2018  Medication Sig  . albuterol (PROVENTIL HFA;VENTOLIN HFA) 108 (90 Base) MCG/ACT inhaler Inhale 2 puffs into the lungs every 6 (six) hours as needed for wheezing or shortness of breath.  . diltiazem (TIAZAC) 360 MG 24 hr capsule TAKE 1 CAPSULE(360 MG) BY MOUTH DAILY  . ELIQUIS 5 MG TABS tablet TAKE 1 TABLET BY MOUTH TWICE DAILY  . enalapril (VASOTEC) 20 MG tablet Take 1 tablet (20 mg total) by mouth 2 (two) times daily.  [name] FLUoxetine (PROZAC) 20 MG capsule Take 1 capsule (20 mg total) by mouth daily.  . fluticasone (FLONASE) 50 MCG/ACT nasal spray SHAKE LIQUID AND USE 2 SPRAYS IN EACH NOSTRIL DAILY  . fluticasone furoate-vilanterol (BREO ELLIPTA) 200-25 MCG/INH AEPB Inhale 1 puff into the lungs daily.  . pravastatin (PRAVACHOL) 10 MG tablet Take 1 tablet (10 mg total) by mouth daily.  . Probiotic Product (PROBIOTIC DAILY PO) Take 1 tablet by mouth.  . Tiotropium Bromide Monohydrate (SPIRIVA RESPIMAT) 1.25 MCG/ACT AERS Inhale 2 puffs into the lungs 2 (two) times daily.  [name] triamterene-hydrochlorothiazide (MAXZIDE-25) 37.5-25 MG tablet Take 1 tablet by mouth daily.  [name] VITAMIN D, CHOLECALCIFEROL, PO Take by mouth daily.   No facility-administered encounter medications on file as of 08/22/2018.     Review of Systems  Constitutional: Negative for appetite change.       Has adjusted diet.  Lost weight.    HENT: Negative for congestion and sinus pressure.   Respiratory: Negative for cough and chest tightness.        Breathing better.    Cardiovascular: Negative for chest pain and palpitations.       Ankle swelling better.    Gastrointestinal: Negative for abdominal pain, diarrhea, nausea and vomiting.  Genitourinary: Negative for difficulty urinating and dysuria.  Musculoskeletal: Negative for joint swelling and myalgias.  Skin: Negative for color change and rash.  Neurological: Negative for dizziness, light-headedness and headaches.   Psychiatric/Behavioral: Negative for agitation and dysphoric mood.       Objective:     Blood pressure rechecked by me:  130/72  Physical Exam  Constitutional: She appears well-developed and well-nourished. No distress.  HENT:  Nose: Nose normal.  Mouth/Throat: Oropharynx is clear and moist.  Neck: Neck supple. No thyromegaly present.  Cardiovascular: Normal rate.  Rate controlled.    Pulmonary/Chest:  Breath sounds normal. No respiratory distress. She has no wheezes.  Abdominal: Soft. Bowel sounds are normal. There is no tenderness.  Musculoskeletal: She exhibits no edema or tenderness.  Lymphadenopathy:    She has no cervical adenopathy.  Skin: No rash noted. No erythema.  Psychiatric: She has a normal mood and affect. Her behavior is normal.    BP 130/72   Pulse 78   Temp 98.4 F (36.9 C) (Oral)   Resp 18   Wt 240 lb (108.9 kg)   SpO2 96%   BMI 42.51 kg/m  Wt Readings from Last 3 Encounters:  08/22/18 240 lb (108.9 kg)  07/21/18 243 lb 4 oz (110.3 kg)  06/27/18 250 lb (113.4 kg)     Lab Results  Component Value Date   WBC 6.1 10/17/2017   HGB 14.0 10/17/2017   HCT 41.4 10/17/2017   PLT 244 10/17/2017   GLUCOSE 106 (H) 08/17/2018   CHOL 162 08/17/2018   TRIG 75 08/17/2018   HDL 61 08/17/2018   LDLCALC 86 08/17/2018   ALT 20 08/17/2018   AST 23 08/17/2018   NA 142 08/17/2018   K 4.0 08/17/2018   CL 99 08/17/2018   CREATININE 1.00 08/17/2018   BUN 19 08/17/2018   CO2 25 08/17/2018   TSH 2.370 10/17/2017   HGBA1C 5.6 08/17/2018       Assessment & Plan:   Problem List Items Addressed This Visit    Anxiety    Stable on prozac.        Atrial fibrillation ([place])    Rate controlled.  Stable.  On eliquis.        Relevant Orders   VITAMIN D 25 Hydroxy (Vit-D Deficiency, Fractures)   TSH   Magnesium   BMI 40.0-44.9, adult ([place])    She is adjusting her diet.  Lost weight.  Doing well.  Feels better.  Follow.        Essential hypertension,  benign    Blood pressure on recheck improved.  Continue current medication regimen.  Follow pressures.  Follow metabolic panel.        Relevant Orders   CBC with Differential/Platelet   Basic metabolic panel   Hyperglycemia    Low carb diet and exercise.  Follow met b and a1c.        Relevant Orders   Hemoglobin A1c   PAD (peripheral artery disease) ([place])    Followed by Dr Fletcher Anon.  Stable.        Pure hypercholesterolemia    On pravastatin.  Low cholesterol diet and exercise.  Follow lipid panel and liver function tests.        Relevant Orders   Hepatic function panel   Lipid panel   Tobacco abuse    Discussed the need to quit smoking.  She has decreased. Follow.         Other Visit Diagnoses    Breast cancer screening    -  Primary   Relevant Orders   MM DIGITAL SCREENING BILATERAL   Need for influenza vaccination       Relevant Orders   Flu vaccine HIGH DOSE PF (Fluzone High dose) (Completed)       Einar Pheasant, MD

## 2018-08-27 ENCOUNTER — Encounter: Payer: Self-pay | Admitting: Internal Medicine

## 2018-08-27 NOTE — Assessment & Plan Note (Signed)
Discussed the need to quit smoking.  She has decreased. Follow.

## 2018-08-27 NOTE — Assessment & Plan Note (Signed)
Rate controlled.  Stable.  On eliquis.

## 2018-08-27 NOTE — Assessment & Plan Note (Signed)
Blood pressure on recheck improved.  Continue current medication regimen.  Follow pressures.  Follow metabolic panel.   

## 2018-08-27 NOTE — Assessment & Plan Note (Signed)
On pravastatin.  Low cholesterol diet and exercise.  Follow lipid panel and liver function tests.   

## 2018-08-27 NOTE — Addendum Note (Signed)
Addended by: Alisa Graff on: 08/27/2018 03:38 PM   Modules accepted: Orders

## 2018-08-27 NOTE — Assessment & Plan Note (Signed)
She is adjusting her diet.  Lost weight.  Doing well.  Feels better.  Follow.

## 2018-08-27 NOTE — Assessment & Plan Note (Signed)
Followed by Dr Arida.  Stable.   

## 2018-08-27 NOTE — Assessment & Plan Note (Signed)
Low carb diet and exercise.  Follow met b and a1c.

## 2018-08-27 NOTE — Assessment & Plan Note (Signed)
Stable on prozac.  

## 2018-08-31 DIAGNOSIS — Z1231 Encounter for screening mammogram for malignant neoplasm of breast: Secondary | ICD-10-CM | POA: Diagnosis not present

## 2018-08-31 LAB — HM MAMMOGRAPHY

## 2018-09-02 ENCOUNTER — Other Ambulatory Visit: Payer: Self-pay | Admitting: Internal Medicine

## 2018-09-02 ENCOUNTER — Other Ambulatory Visit: Payer: Self-pay | Admitting: Cardiovascular Disease

## 2018-09-07 ENCOUNTER — Encounter: Payer: Self-pay | Admitting: Internal Medicine

## 2018-09-30 ENCOUNTER — Other Ambulatory Visit: Payer: Self-pay | Admitting: Internal Medicine

## 2018-10-10 DIAGNOSIS — H353211 Exudative age-related macular degeneration, right eye, with active choroidal neovascularization: Secondary | ICD-10-CM | POA: Diagnosis not present

## 2018-10-11 ENCOUNTER — Other Ambulatory Visit: Payer: Self-pay | Admitting: Internal Medicine

## 2018-11-14 DIAGNOSIS — H353211 Exudative age-related macular degeneration, right eye, with active choroidal neovascularization: Secondary | ICD-10-CM | POA: Diagnosis not present

## 2018-11-19 ENCOUNTER — Other Ambulatory Visit: Payer: Self-pay | Admitting: Internal Medicine

## 2018-12-07 DIAGNOSIS — Z85828 Personal history of other malignant neoplasm of skin: Secondary | ICD-10-CM | POA: Diagnosis not present

## 2018-12-07 DIAGNOSIS — D225 Melanocytic nevi of trunk: Secondary | ICD-10-CM | POA: Diagnosis not present

## 2018-12-07 DIAGNOSIS — D1800 Hemangioma unspecified site: Secondary | ICD-10-CM | POA: Diagnosis not present

## 2018-12-07 DIAGNOSIS — L812 Freckles: Secondary | ICD-10-CM | POA: Diagnosis not present

## 2018-12-07 DIAGNOSIS — L821 Other seborrheic keratosis: Secondary | ICD-10-CM | POA: Diagnosis not present

## 2018-12-07 DIAGNOSIS — D2260 Melanocytic nevi of unspecified upper limb, including shoulder: Secondary | ICD-10-CM | POA: Diagnosis not present

## 2018-12-07 DIAGNOSIS — I8393 Asymptomatic varicose veins of bilateral lower extremities: Secondary | ICD-10-CM | POA: Diagnosis not present

## 2018-12-07 DIAGNOSIS — I781 Nevus, non-neoplastic: Secondary | ICD-10-CM | POA: Diagnosis not present

## 2018-12-07 DIAGNOSIS — Z1283 Encounter for screening for malignant neoplasm of skin: Secondary | ICD-10-CM | POA: Diagnosis not present

## 2018-12-19 DIAGNOSIS — H353211 Exudative age-related macular degeneration, right eye, with active choroidal neovascularization: Secondary | ICD-10-CM | POA: Diagnosis not present

## 2018-12-21 ENCOUNTER — Telehealth: Payer: Self-pay | Admitting: *Deleted

## 2018-12-21 NOTE — Telephone Encounter (Signed)
Copied from [place] [id number]. Topic: General - Other >> Dec 20, 2018  3:18 PM Leward Quan A wrote: Reason for CRM: Patient called to inquire about labs to be done at [facility] before her physical on 12/26/2018. She would like to know if its ok to go get them done. Please call back Ph# [phone]

## 2018-12-21 NOTE — Telephone Encounter (Signed)
Notified patient labs are already in system.

## 2018-12-22 DIAGNOSIS — R739 Hyperglycemia, unspecified: Secondary | ICD-10-CM | POA: Diagnosis not present

## 2018-12-22 DIAGNOSIS — I10 Essential (primary) hypertension: Secondary | ICD-10-CM | POA: Diagnosis not present

## 2018-12-22 DIAGNOSIS — I4819 Other persistent atrial fibrillation: Secondary | ICD-10-CM | POA: Diagnosis not present

## 2018-12-22 DIAGNOSIS — E7800 Pure hypercholesterolemia, unspecified: Secondary | ICD-10-CM | POA: Diagnosis not present

## 2018-12-23 ENCOUNTER — Encounter: Payer: Self-pay | Admitting: Internal Medicine

## 2018-12-25 LAB — CBC WITH DIFFERENTIAL/PLATELET
BASOS: 1 %
Basophils Absolute: 0.1 10*3/uL (ref 0.0–0.2)
EOS (ABSOLUTE): 0.3 10*3/uL (ref 0.0–0.4)
EOS: 4 %
HEMATOCRIT: 41.9 % (ref 34.0–46.6)
Hemoglobin: 13.8 g/dL (ref 11.1–15.9)
IMMATURE GRANS (ABS): 0 10*3/uL (ref 0.0–0.1)
IMMATURE GRANULOCYTES: 0 %
LYMPHS: 17 %
Lymphocytes Absolute: 1.3 10*3/uL (ref 0.7–3.1)
MCH: 29.1 pg (ref 26.6–33.0)
MCHC: 32.9 g/dL (ref 31.5–35.7)
MCV: 88 fL (ref 79–97)
Monocytes Absolute: 0.6 10*3/uL (ref 0.1–0.9)
Monocytes: 9 %
NEUTROS ABS: 5.2 10*3/uL (ref 1.4–7.0)
Neutrophils: 69 %
PLATELETS: 273 10*3/uL (ref 150–450)
RBC: 4.74 x10E6/uL (ref 3.77–5.28)
RDW: 11.8 % (ref 11.7–15.4)
WBC: 7.5 10*3/uL (ref 3.4–10.8)

## 2018-12-25 LAB — BASIC METABOLIC PANEL
BUN/Creatinine Ratio: 20 (ref 12–28)
BUN: 18 mg/dL (ref 8–27)
CO2: 23 mmol/L (ref 20–29)
CREATININE: 0.88 mg/dL (ref 0.57–1.00)
Calcium: 9.9 mg/dL (ref 8.7–10.3)
Chloride: 104 mmol/L (ref 96–106)
GFR calc Af Amer: 73 mL/min/{1.73_m2} (ref >59)
GFR calc non Af Amer: 64 mL/min/{1.73_m2} (ref >59)
GLUCOSE: 100 mg/dL — AB (ref 65–99)
POTASSIUM: 4.3 mmol/L (ref 3.5–5.2)
SODIUM: 147 mmol/L — AB (ref 134–144)

## 2018-12-25 LAB — LIPID PANEL
CHOL/HDL RATIO: 2.4 ratio (ref 0.0–4.4)
Cholesterol, Total: 170 mg/dL (ref 100–199)
HDL: 70 mg/dL (ref >39)
LDL CALC: 86 mg/dL (ref 0–99)
Triglycerides: 71 mg/dL (ref 0–149)
VLDL CHOLESTEROL CAL: 14 mg/dL (ref 5–40)

## 2018-12-25 LAB — HEPATIC FUNCTION PANEL
ALBUMIN: 4.5 g/dL (ref 3.5–4.8)
ALK PHOS: 67 IU/L (ref 39–117)
ALT: 13 IU/L (ref 0–32)
AST: 16 IU/L (ref 0–40)
BILIRUBIN TOTAL: 0.8 mg/dL (ref 0.0–1.2)
BILIRUBIN, DIRECT: 0.22 mg/dL (ref 0.00–0.40)
TOTAL PROTEIN: 7.1 g/dL (ref 6.0–8.5)

## 2018-12-25 LAB — TSH: TSH: 3.09 u[IU]/mL (ref 0.450–4.500)

## 2018-12-25 LAB — HEMOGLOBIN A1C
Est. average glucose Bld gHb Est-mCnc: 114 mg/dL
Hgb A1c MFr Bld: 5.6 % (ref 4.8–5.6)

## 2018-12-25 LAB — VITAMIN D 25 HYDROXY (VIT D DEFICIENCY, FRACTURES): Vit D, 25-Hydroxy: 41.1 ng/mL (ref 30.0–100.0)

## 2018-12-25 LAB — MAGNESIUM: MAGNESIUM: 1.9 mg/dL (ref 1.6–2.3)

## 2018-12-26 ENCOUNTER — Ambulatory Visit (INDEPENDENT_AMBULATORY_CARE_PROVIDER_SITE_OTHER): Payer: PPO | Admitting: Internal Medicine

## 2018-12-26 ENCOUNTER — Encounter: Payer: Self-pay | Admitting: Internal Medicine

## 2018-12-26 VITALS — BP 130/72 | HR 85 | Temp 98.3°F | Resp 18 | Ht 63.0 in | Wt 236.2 lb

## 2018-12-26 DIAGNOSIS — F419 Anxiety disorder, unspecified: Secondary | ICD-10-CM | POA: Diagnosis not present

## 2018-12-26 DIAGNOSIS — J449 Chronic obstructive pulmonary disease, unspecified: Secondary | ICD-10-CM

## 2018-12-26 DIAGNOSIS — I4891 Unspecified atrial fibrillation: Secondary | ICD-10-CM

## 2018-12-26 DIAGNOSIS — I10 Essential (primary) hypertension: Secondary | ICD-10-CM

## 2018-12-26 DIAGNOSIS — R739 Hyperglycemia, unspecified: Secondary | ICD-10-CM

## 2018-12-26 DIAGNOSIS — Z0000 Encounter for general adult medical examination without abnormal findings: Secondary | ICD-10-CM | POA: Diagnosis not present

## 2018-12-26 DIAGNOSIS — Z6841 Body Mass Index (BMI) 40.0 and over, adult: Secondary | ICD-10-CM

## 2018-12-26 DIAGNOSIS — E7800 Pure hypercholesterolemia, unspecified: Secondary | ICD-10-CM

## 2018-12-26 DIAGNOSIS — Z720 Tobacco use: Secondary | ICD-10-CM | POA: Diagnosis not present

## 2018-12-26 MED ORDER — FLUOXETINE HCL 20 MG PO CAPS: ORAL_CAPSULE | ORAL | 0 refills | Status: DC

## 2018-12-26 MED ORDER — PRAVASTATIN SODIUM 10 MG PO TABS: 10.0000 mg | ORAL_TABLET | Freq: Every day | ORAL | 3 refills | Status: DC

## 2018-12-26 MED ORDER — APIXABAN 5 MG PO TABS: 5.0000 mg | ORAL_TABLET | Freq: Two times a day (BID) | ORAL | 2 refills | Status: DC

## 2018-12-26 MED ORDER — TRIAMTERENE-HCTZ 37.5-25 MG PO TABS: 1.0000 | ORAL_TABLET | Freq: Every day | ORAL | 1 refills | Status: DC

## 2018-12-26 NOTE — Progress Notes (Signed)
Patient ID: Danielle Yates, female   DOB: [birth year], 78 y.o.   MRN: [id number]   Subjective:    Patient ID: Danielle Yates, female    DOB: 1941-02-02, 79 y.o.   MRN: [id number]  HPI  Patient here for her physical exam.  She reports she is doing relatively well.  Being followed by [facility] for macular degeneration.  Trying to stay active.  No chest pain.  No sob.  No acid reflux.  No abdominal pain.  Bowels moving.  Still smoking.  Has cut down to 10 cigarettes per day.  Discussed the need to quit.  She declines to quit at this time.  Has f/u with pulmonary 01/08/19.     Past Medical History:  Diagnosis Date  . Allergy   . Aortic valve sclerosis    a. TTE 2013: EF 60-65%, no RWMA, Gr1DD, trivial MR, mildly dilated LA 43 mm; b. TTE 08/2017: EF 65-70%, mild LVH, aortic valve sclerosis without stenosis, mildly dilated LA 36 mm, RV size and sys fxn nl  . Arthropathy, unspecified, site unspecified   . Cataract   . Chronic rhinitis   . Disorder of bone and cartilage, unspecified   . Diverticulosis of colon (without mention of hemorrhage)   . Essential hypertension    a. refractory HTN; b. renal US 2013 negative for RAS  . External hemorrhoids without mention of complication   . Medication intolerance   . Obesity, unspecified   . Other abnormal glucose   . Panic disorder without agoraphobia   . Peripheral arterial disease ([place])    a.  Known bilateral common iliac disease-> medical therapy-asymptomatic.  [name] Persistent atrial fibrillation    a. diagnosed 10/19/17; b. CHADS2VASc 5 (HTN, age x 2, vascular disease, female)-->Eliquis  . Pure hypercholesterolemia   . Tobacco use disorder   . Unspecified vitamin D deficiency    Past Surgical History:  Procedure Laterality Date  . APPENDECTOMY  1985  . CHOLECYSTECTOMY  1985   with open wound  . EYE SURGERY  2011   CATARACTS -  BOTH  . Removed spot on face     [name]  . TONSILLECTOMY AND ADENOIDECTOMY  1946  . VAGINAL  HYSTERECTOMY  1982   fibroid and endometriosis; one ovary remaining   Family History  Problem Relation Age of Onset  . Hypertension Sister   . Heart disease Brother   . Hypertension Brother   . Hyperlipidemia Brother   . Aneurysm Mother   . Rheumatic fever Mother        as an adult  . Heart disease Father        MI x 2   Social History   Socioeconomic History  . Marital status: Widowed    Spouse name: Not on file  . Number of children: 0  . Years of education: college  . Highest education level: Not on file  Occupational History  . 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 Financial resource strain: Not on file  . Food insecurity:    Worry: Not on file    Inability: Not on file  . Transportation needs:    Medical: Not on file    Non-medical: Not on file  Tobacco Use  . Smoking status: Current Every Day Smoker    Packs/day: 0.50    Years: 50.00    Pack years: 25.00    Types: Cigarettes  . Smokeless tobacco: Never Used  Substance and Sexual Activity  . Alcohol use: No    Alcohol/week: 0.0 standard drinks  . Drug use: No  . Sexual activity: Never  Lifestyle  . Physical activity:    Days per week: Not on file    Minutes per session: Not on file  . Stress: Not on file  Relationships  . Social connections:    Talks on phone: Not on file    Gets together: Not on file    Attends religious service: Not on file    Active member of club or organization: Not on file    Attends meetings of clubs or organizations: Not on file    Relationship status: Not on file  Other Topics Concern  . Not on file  Social History Narrative   Lives alone. Widowed after 23 years. Not dating. Has dog. Exercise: Walking; light has not been walking as she used to before her 2 dogs passed away. Tries to walk some. Caffeine: Carbonated beverages, 3 servings/day;Diet coke. 3 a day.      10/02/12  PER PATIENT'S LAVENDER FORM: WALK 5 DAYS/WEEK FOR 30  MINUTES    Outpatient Encounter Medications as of 12/26/2018  Medication Sig  . albuterol (PROVENTIL HFA;VENTOLIN HFA) 108 (90 Base) MCG/ACT inhaler Inhale 2 puffs into the lungs every 6 (six) hours as needed for wheezing or shortness of breath.  [name] apixaban (ELIQUIS) 5 MG TABS tablet Take 1 tablet (5 mg total) by mouth 2 (two) times daily.  [name] diltiazem (TIAZAC) 360 MG 24 hr capsule TAKE 1 CAPSULE(360 MG) BY MOUTH DAILY  . FLUoxetine (PROZAC) 20 MG capsule TAKE 1 CAPSULE(20 MG) BY MOUTH DAILY  . fluticasone (FLONASE) 50 MCG/ACT nasal spray SHAKE LIQUID AND USE 2 SPRAYS IN EACH NOSTRIL DAILY  . fluticasone furoate-vilanterol (BREO ELLIPTA) 200-25 MCG/INH AEPB Inhale 1 puff into the lungs daily.  . pravastatin (PRAVACHOL) 10 MG tablet Take 1 tablet (10 mg total) by mouth daily.  . Probiotic Product (PROBIOTIC DAILY PO) Take 1 tablet by mouth.  . SPIRIVA RESPIMAT 1.25 MCG/ACT AERS INHALE 2 PUFFS INTO THE LUNGS TWICE DAILY  . triamterene-hydrochlorothiazide (MAXZIDE-25) 37.5-25 MG tablet Take 1 tablet by mouth daily.  [name] VITAMIN D, CHOLECALCIFEROL, PO Take by mouth daily.  . [DISCONTINUED] ELIQUIS 5 MG TABS tablet TAKE 1 TABLET BY MOUTH TWICE DAILY  . [DISCONTINUED] enalapril (VASOTEC) 20 MG tablet Take 1 tablet (20 mg total) by mouth 2 (two) times daily.  . [DISCONTINUED] FLUoxetine (PROZAC) 20 MG capsule TAKE 1 CAPSULE(20 MG) BY MOUTH DAILY  . [DISCONTINUED] pravastatin (PRAVACHOL) 10 MG tablet Take 1 tablet (10 mg total) by mouth daily.  . [DISCONTINUED] triamterene-hydrochlorothiazide (MAXZIDE-25) 37.5-25 MG tablet Take 1 tablet by mouth daily.   No facility-administered encounter medications on file as of 12/26/2018.     Review of Systems  Constitutional: Negative for appetite change and unexpected weight change.  HENT: Negative for congestion and sinus pressure.   Eyes: Negative for pain and visual disturbance.  Respiratory: Negative for cough and chest tightness.        Breathing stable.    Cardiovascular: Negative for chest pain, palpitations and leg swelling.  Gastrointestinal: Negative for abdominal pain, diarrhea, nausea and vomiting.  Genitourinary: Negative for difficulty urinating and dysuria.  Musculoskeletal: Negative for joint swelling and myalgias.  Skin: Negative for color change and rash.  Neurological: Negative for dizziness, light-headedness and headaches.  Hematological: Negative for adenopathy. Does not bruise/bleed easily.  Psychiatric/Behavioral: Negative for agitation and dysphoric mood.  Objective:     Blood pressure rechecked by me:  118/68  Physical Exam Constitutional:      General: She is not in acute distress.    Appearance: Normal appearance. She is well-developed.  HENT:     Nose: Nose normal. No congestion.     Mouth/Throat:     Pharynx: No oropharyngeal exudate or posterior oropharyngeal erythema.  Eyes:     General: No scleral icterus.       Right eye: No discharge.        Left eye: No discharge.  Neck:     Musculoskeletal: Neck supple. No muscular tenderness.     Thyroid: No thyromegaly.  Cardiovascular:     Rate and Rhythm: Normal rate and regular rhythm.  Pulmonary:     Effort: No tachypnea, accessory muscle usage or respiratory distress.     Breath sounds: Normal breath sounds. No decreased breath sounds or wheezing.  Chest:     Breasts:        Right: No inverted nipple, mass, nipple discharge or tenderness (no axillary adenopathy).        Left: No inverted nipple, mass, nipple discharge or tenderness (no axilarry adenopathy).  Abdominal:     General: Bowel sounds are normal.     Palpations: Abdomen is soft.     Tenderness: There is no abdominal tenderness.  Musculoskeletal:        General: No swelling or tenderness.  Lymphadenopathy:     Cervical: No cervical adenopathy.  Skin:    Findings: No erythema or rash.  Neurological:     Mental Status: She is alert and oriented to person, place, and time.    Psychiatric:        Mood and Affect: Mood normal.        Behavior: Behavior normal.     BP 130/72 (BP Location: Left Arm, Patient Position: Sitting, Cuff Size: Normal)   Pulse 85   Temp 98.3 F (36.8 C) (Oral)   Resp 18   Ht 5' 3"  (1.6 m)   Wt 236 lb 3.2 oz (107.1 kg)   SpO2 98%   BMI 41.84 kg/m  Wt Readings from Last 3 Encounters:  12/26/18 236 lb 3.2 oz (107.1 kg)  08/22/18 240 lb (108.9 kg)  07/21/18 243 lb 4 oz (110.3 kg)     Lab Results  Component Value Date   WBC 7.5 12/22/2018   HGB 13.8 12/22/2018   HCT 41.9 12/22/2018   PLT 273 12/22/2018   GLUCOSE 100 (H) 12/22/2018   CHOL 170 12/22/2018   TRIG 71 12/22/2018   HDL 70 12/22/2018   LDLCALC 86 12/22/2018   ALT 13 12/22/2018   AST 16 12/22/2018   NA 147 (H) 12/22/2018   K 4.3 12/22/2018   CL 104 12/22/2018   CREATININE 0.88 12/22/2018   BUN 18 12/22/2018   CO2 23 12/22/2018   TSH 3.090 12/22/2018   HGBA1C 5.6 12/22/2018       Assessment & Plan:   Problem List Items Addressed This Visit    Anxiety    Stable on prozac.        Relevant Medications   FLUoxetine (PROZAC) 20 MG capsule   Atrial fibrillation ([place])    On eliquis.  Rate controlled.  Sees cardiology.        Relevant Medications   apixaban (ELIQUIS) 5 MG TABS tablet   pravastatin (PRAVACHOL) 10 MG tablet   triamterene-hydrochlorothiazide (MAXZIDE-25) 37.5-25 MG tablet   BMI 40.0-44.9, adult ([place])  Discussed diet and exercise.        Chronic obstructive pulmonary disease (H[facility])    Sees pulmonary.  Breathing stable.  Due to f/u with Dr Mortimer Fries 01/08/19.        Essential hypertension, benign    Blood pressure under good control.  Continue same medication regimen.  Follow pressures.  Follow metabolic panel.        Relevant Medications   apixaban (ELIQUIS) 5 MG TABS tablet   pravastatin (PRAVACHOL) 10 MG tablet   triamterene-hydrochlorothiazide (MAXZIDE-25) 37.5-25 MG tablet   Health care maintenance    Physical today 12/26/18.   Mammogram 08/31/18 - Birads I.  Colonoscopy 2012.  Recommended f/u colonoscopy in 10 years.        Hyperglycemia    Low carb diet and exercise.  Follow met b and a1c.        Relevant Orders   Hemoglobin I1W   Basic metabolic panel   Pure hypercholesterolemia    On pravastatin.  Low cholesterol diet and exercise.  Follow lipid panel and liver function tests.        Relevant Medications   apixaban (ELIQUIS) 5 MG TABS tablet   pravastatin (PRAVACHOL) 10 MG tablet   triamterene-hydrochlorothiazide (MAXZIDE-25) 37.5-25 MG tablet   Other Relevant Orders   Hepatic function panel   Lipid panel   Tobacco abuse    Discussed the need to quit smoking.  She has decreased the amount that she smokes.  Desires not to quit at this time.  Follow.         Other Visit Diagnoses    Routine general medical examination at a health care facility    -  Primary       Einar Pheasant, MD

## 2018-12-26 NOTE — Assessment & Plan Note (Addendum)
Physical today 12/26/18.  Mammogram 08/31/18 - Birads I.  Colonoscopy 2012.  Recommended f/u colonoscopy in 10 years.

## 2018-12-28 ENCOUNTER — Telehealth: Payer: Self-pay

## 2018-12-28 MED ORDER — ENALAPRIL MALEATE 20 MG PO TABS: 20.0000 mg | ORAL_TABLET | Freq: Two times a day (BID) | ORAL | 1 refills | Status: DC

## 2018-12-28 NOTE — Telephone Encounter (Signed)
*  STAT* If patient is at the pharmacy, call can be transferred to refill team.   1. Which medications need to be refilled? (please list name of each medication and dose if known) Enalapril  2. Which pharmacy/location (including street and city if local pharmacy) is medication to be sent to? [place]  3. Do they need a 30 day or 90 day supply? [place]

## 2019-01-01 ENCOUNTER — Encounter: Payer: Self-pay | Admitting: Internal Medicine

## 2019-01-01 DIAGNOSIS — J449 Chronic obstructive pulmonary disease, unspecified: Secondary | ICD-10-CM | POA: Insufficient documentation

## 2019-01-01 NOTE — Assessment & Plan Note (Signed)
Stable on prozac.  

## 2019-01-01 NOTE — Assessment & Plan Note (Signed)
On eliquis.  Rate controlled.  Sees cardiology.

## 2019-01-01 NOTE — Assessment & Plan Note (Signed)
Discussed the need to quit smoking.  She has decreased the amount that she smokes.  Desires not to quit at this time.  Follow.

## 2019-01-01 NOTE — Assessment & Plan Note (Signed)
Discussed diet and exercise 

## 2019-01-01 NOTE — Assessment & Plan Note (Signed)
Sees pulmonary.  Breathing stable.  Due to f/u with Dr Mortimer Fries 01/08/19.

## 2019-01-01 NOTE — Assessment & Plan Note (Signed)
Blood pressure under good control.  Continue same medication regimen.  Follow pressures.  Follow metabolic panel.   

## 2019-01-01 NOTE — Assessment & Plan Note (Signed)
On pravastatin.  Low cholesterol diet and exercise.  Follow lipid panel and liver function tests.   

## 2019-01-01 NOTE — Assessment & Plan Note (Signed)
Low carb diet and exercise.  Follow met b and a1c.   

## 2019-01-02 NOTE — Telephone Encounter (Signed)
Please resubmit [facility] states that they have not yet received a prescription for Enalapril

## 2019-01-03 ENCOUNTER — Telehealth: Payer: Self-pay

## 2019-01-03 MED ORDER — ENALAPRIL MALEATE 20 MG PO TABS: 20.0000 mg | ORAL_TABLET | Freq: Two times a day (BID) | ORAL | 1 refills | Status: DC

## 2019-01-03 NOTE — Telephone Encounter (Signed)
Rx for Enalapril resent to [place].

## 2019-01-08 ENCOUNTER — Encounter: Payer: Self-pay | Admitting: Internal Medicine

## 2019-01-08 ENCOUNTER — Ambulatory Visit: Payer: PPO | Admitting: Internal Medicine

## 2019-01-08 ENCOUNTER — Telehealth: Payer: Self-pay | Admitting: *Deleted

## 2019-01-08 VITALS — BP 150/92 | HR 107 | Ht 63.0 in | Wt 237.0 lb

## 2019-01-08 DIAGNOSIS — F17210 Nicotine dependence, cigarettes, uncomplicated: Secondary | ICD-10-CM | POA: Diagnosis not present

## 2019-01-08 DIAGNOSIS — J449 Chronic obstructive pulmonary disease, unspecified: Secondary | ICD-10-CM | POA: Diagnosis not present

## 2019-01-08 DIAGNOSIS — Z720 Tobacco use: Secondary | ICD-10-CM

## 2019-01-08 NOTE — Telephone Encounter (Signed)
Received referral for lung cancer screening. Contacted patient and reviewed lung screening process and answered questions for patient. Patient decided she will think about it and call back if she would like to be scheduled for screening.

## 2019-01-08 NOTE — Patient Instructions (Signed)
Lung cancer screening referral  Hold BREO and assess breathing  Risks of NOT using oxygen

## 2019-01-08 NOTE — Progress Notes (Signed)
Name: Danielle Yates MRN: [id number] DOB: 11/13/1941     CONSULTATION DATE: 6.20.19 REFERRING MD : ARIDA  CHIEF COMPLAINT: cough and SOB  STUDIES:     CXR independently reviewed by Me +flattenend diaphragms Low lung volumes  Office Spirometry Ratio 64% Fev1 1.5L 79% FEF25/75 0.8L 51%predicted Interpretation Moderate Obstructive airways disease      HISTORY OF PRESENT ILLNESS: Longtime smoking history approximately 50-pack-year tobacco abuse and continues to smoke  no signs of infection at this time  No signs of exacerbation at this time  no signs of heart failure at this time No signs of COPD exacerbation at this time  Patient uses Breo daily and Spiriva once daily She would like to try to wean herself off of Breo  Patient qualifies for oxygen at nighttime however she refuses She knows that she may die at night knowing that she is not using her oxygen She does not want to use oxygen at nighttime  6-minute walk test was within normal limits  She continues to smoke 1/2 pack/day She denies any wheezing or shortness of breath at this time  Spirometry consistent with moderate obstructive airways disease  Smoking cessation strongly advised     Smoking Assessment and Cessation Counseling   Upon further questioning, Patient smokes 1/2 PPD  I have advised patient to quit/stop smoking as soon as possible due to high risk for multiple medical problems  Patient is NOT willing to quit smoking  I have advised patient that we can assist and have options of Nicotine replacement therapy. I also advised patient on behavioral therapy and can provide oral medication therapy in conjunction with the other therapies  Follow up next Office visit  for assessment of smoking cessation  Smoking cessation counseling advised for 4 minutes         Review of Systems:  Gen:  Denies  fever, sweats, chills weigh loss  HEENT: Denies blurred vision, double vision, ear pain,  eye pain, hearing loss, nose bleeds, sore throat Cardiac:  No dizziness, chest pain or heaviness, chest tightness,edema, No JVD Resp:   No cough, -sputum production, -shortness of breath,-wheezing, -hemoptysis,  Gi: Denies swallowing difficulty, stomach pain, nausea or vomiting, diarrhea, constipation, bowel incontinence Gu:  Denies bladder incontinence, burning urine Ext:   Denies Joint pain, stiffness or swelling Skin: Denies  skin rash, easy bruising or bleeding or hives Endoc:  Denies polyuria, polydipsia , polyphagia or weight change Psych:   Denies depression, insomnia or hallucinations  Other:  All other systems negative     BP (!) 150/92 (BP Location: Right Arm, Cuff Size: Normal)   Pulse (!) 107   Ht 5\' 3"  (1.6 m)   Wt 237 lb (107.5 kg)   SpO2 98%   BMI 41.98 kg/m   Physical Examination:   GENERAL:NAD, no fevers, chills, no weakness no fatigue HEAD: Normocephalic, atraumatic.  EYES: PERLA, EOMI No scleral icterus.  MOUTH: Moist mucosal membrane.  EAR, NOSE, THROAT: Clear without exudates. No external lesions.  NECK: Supple. No thyromegaly.  No JVD.  PULMONARY: CTA B/L no wheezing, rhonchi, crackles CARDIOVASCULAR: S1 and S2. Regular rate and rhythm. No murmurs GASTROINTESTINAL: Soft, nontender, nondistended. Positive bowel sounds.  MUSCULOSKELETAL: No swelling, clubbing, or edema.  NEUROLOGIC: No gross focal neurological deficits. 5/5 strength all extremities SKIN: No ulceration, lesions, rashes, or cyanosis.  PSYCHIATRIC: Insight, judgment intact. -depression -anxiety ALL OTHER ROS ARE NEGATIVE       ASSESSMENT / PLAN:  78 year old white female seen today  for follow-up COPD with ongoing tobacco abuse in the setting of deconditioned state with chronic hypoxic respiratory failure from COPD  #1 shortness of breath and wheezing and dyspnea on exertion seems to be stable Has a history of moderate COPD and ongoing tobacco abuse  #2 moderate COPD Gold stage B at  this time Patient wants to stop Breo and use Spiriva Respimat once daily Albuterol as needed  #3 smoking cessation advised She refuses to stop smoking at this time\   #4 chronic hypoxic respiratory failure-from COPD At this time patient refuses to wear oxygen therapy She knows that she is at high risk for arrhythmias and death if she does not wear her oxygen  #5 obesity -recommend significant weight loss -recommend changing diet  #6 deconditioned state -Recommend increased daily activity and exercise   #7 extensive smoking history Patient seems to be candidate for lung cancer screening referral program We will place referral  Patient  satisfied with Plan of action and management. All questions answered Follow up in 6 months   Nyasia Baxley Patricia Pesa, M.D.  [facility]  Medical Director [place] Director [facility] Cardio-Pulmonary Department

## 2019-01-18 NOTE — Progress Notes (Signed)
Cardiology Office Note Date:  01/19/2019  Patient ID:  Danielle Yates, Danielle Yates 07-01-1941, MRN [id number] PCP:  Einar Pheasant, MD  Cardiologist:  Dr. Fletcher Anon, MD    Chief Complaint: Follow-up  History of Present Illness: Danielle Yates is a 78 y.o. female with history of permanent A. fib on Eliquis diagnosed in 10/2017, refractory hypertension, PAD, COPD with ongoing tobacco abuse, hyperlipidemia, and obesity who presents for follow-up of her A. Fib.  She has previously not tolerated metoprolol due to bradycardia and fatigue. She has not tolerated amlodipine or Coreg secondary to headache and dizziness. She is also known to be intolerant to statins, though has been tolerating pravastatin. She has previously undergone echo in 2013 that showed normal LV systolic function with an EF of 60-65%, no RWMA, Gr1DD, trivial mitral regurgitation, and a mildly dilated left atrium measuring 43 mm. Renal artery ultrasound in 2013 showed no evidence of RAS. She has known bilateral common iliac artery disease that is being managed medically given lack of claudication symptoms.  Patient was diagnosed with A. fib with RVR in 10/2017 and was started on Eliquis at that time given a CHADS12VASc of 5.  She was asymptomatic.  Recent echo in 08/2017 showed normal LV systolic function with an EF of 65 to 70%, mild LVH, aortic valve sclerosis without stenosis, mildly dilated left atrium, RV cavity size is normal with normal RV systolic function.  She was most recently seen in the office in 07/2018 and was doing reasonably well.  She continued to be in A. fib and was asymptomatic.  She had lost 10 pounds over the prior 6 months with improved diet.  She was trying to taper her tobacco use.  She denied any claudication.  Given her ventricular rate was not optimally controlled on maximal dose diltiazem she was started on Lopressor 25 mg twice daily.  Unfortunately, she called back 6 days later noting she did not tolerate Lopressor  25 mg twice daily or 12.5 mg twice daily.  It was recommended she could discontinue this medication at that time.  Labs: 12/2018 - CBC unremarkable, A1c 5.6, LFT normal, TSH normal, LDL 86, serum creatinine 0.88, potassium 4.3, magnesium 1.9  Patient comes in feeling well from a cardiac perspective.  Her weight is down 9 pounds from her last office visit.  She continues to eat a healthier diet.  No chest pain, shortness of breath, palpitations, dizziness, presyncope, or syncope.  No lower extremity swelling, abdominal distention, orthopnea, PND, early satiety.  Since she was last seen, no falls, BRBPR, or melena.  She is tolerating Eliquis without issues.  She reports her heart rates at home have been reasonably well controlled.  She is under increased stress currently as she has recently been told her brother, who lives in [place], is dealing with metastatic prostate cancer.  She also feels like some component of her heart rates are related to white coat syndrome.  She does not have any issues or concerns today.   Past Medical History:  Diagnosis Date  . Allergy   . Aortic valve sclerosis    a. TTE 2013: EF 60-65%, no RWMA, Gr1DD, trivial MR, mildly dilated LA 43 mm; b. TTE 08/2017: EF 65-70%, mild LVH, aortic valve sclerosis without stenosis, mildly dilated LA 36 mm, RV size and sys fxn nl  . Arthropathy, unspecified, site unspecified   . Cataract   . Chronic rhinitis   . Disorder of bone and cartilage, unspecified   . Diverticulosis of  colon (without mention of hemorrhage)   . Essential hypertension    a. refractory HTN; b. renal US 2013 negative for RAS  . External hemorrhoids without mention of complication   . Medication intolerance   . Obesity, unspecified   . Other abnormal glucose   . Panic disorder without agoraphobia   . Peripheral arterial disease ([place])    a.  Known bilateral common iliac disease-> medical therapy-asymptomatic.  Danielle Yates Persistent atrial fibrillation    a. diagnosed  10/19/17; b. CHADS2VASc 5 (HTN, age x 2, vascular disease, female)-->Eliquis  . Pure hypercholesterolemia   . Tobacco use disorder   . Unspecified vitamin D deficiency     Past Surgical History:  Procedure Laterality Date  . APPENDECTOMY  1985  . CHOLECYSTECTOMY  1985   with open wound  . EYE SURGERY  2011   CATARACTS -  BOTH  . Removed spot on face     [name]  . TONSILLECTOMY AND ADENOIDECTOMY  1946  . VAGINAL HYSTERECTOMY  1982   fibroid and endometriosis; one ovary remaining    Current Meds  Medication Sig  . albuterol (PROVENTIL HFA;VENTOLIN HFA) 108 (90 Base) MCG/ACT inhaler Inhale 2 puffs into the lungs every 6 (six) hours as needed for wheezing or shortness of breath.  Danielle Yates apixaban (ELIQUIS) 5 MG TABS tablet Take 1 tablet (5 mg total) by mouth 2 (two) times daily.  Danielle Yates diltiazem (TIAZAC) 360 MG 24 hr capsule TAKE 1 CAPSULE(360 MG) BY MOUTH DAILY  . enalapril (VASOTEC) 20 MG tablet Take one tablet in the am & two tablets in the pm.  . FLUoxetine (PROZAC) 20 MG capsule TAKE 1 CAPSULE(20 MG) BY MOUTH DAILY  . fluticasone (FLONASE) 50 MCG/ACT nasal spray SHAKE LIQUID AND USE 2 SPRAYS IN EACH NOSTRIL DAILY  . pravastatin (PRAVACHOL) 10 MG tablet Take 1 tablet (10 mg total) by mouth daily.  . Probiotic Product (PROBIOTIC DAILY PO) Take 1 tablet by mouth.  . SPIRIVA RESPIMAT 1.25 MCG/ACT AERS INHALE 2 PUFFS INTO THE LUNGS TWICE DAILY  . triamterene-hydrochlorothiazide (MAXZIDE-25) 37.5-25 MG tablet Take 1 tablet by mouth daily.  Danielle Yates VITAMIN D, CHOLECALCIFEROL, PO Take by mouth daily.    Allergies:   Amlodipine; Codeine; Coreg [carvedilol]; Crestor [rosuvastatin]; Metoprolol; Statins; and Nickel   Social History:  The patient  reports that she has been smoking cigarettes. She has a 25.00 pack-year smoking history. She has never used smokeless tobacco. She reports that she does not drink alcohol or use drugs.   Family History:  The patient's family history includes Aneurysm in her  mother; Heart disease in her brother and father; Hyperlipidemia in her brother; Hypertension in her brother and sister; Rheumatic fever in her mother.  ROS:   Review of Systems  Constitutional: Negative for chills, diaphoresis, fever, malaise/fatigue and weight loss.  HENT: Negative for congestion.   Eyes: Negative for discharge and redness.  Respiratory: Negative for cough, hemoptysis, sputum production, shortness of breath and wheezing.   Cardiovascular: Negative for chest pain, palpitations, orthopnea, claudication, leg swelling and PND.  Gastrointestinal: Negative for abdominal pain, blood in stool, heartburn, melena, nausea and vomiting.  Genitourinary: Negative for hematuria.  Musculoskeletal: Negative for falls and myalgias.  Skin: Negative for rash.  Neurological: Negative for dizziness, tingling, tremors, sensory change, speech change, focal weakness, loss of consciousness and weakness.  Endo/Heme/Allergies: Does not bruise/bleed easily.  Psychiatric/Behavioral: Negative for substance abuse. The patient is not nervous/anxious.   All other systems reviewed and are negative.  PHYSICAL EXAM:  VS:  BP 130/68 (BP Location: Left Arm, Patient Position: Sitting, Cuff Size: Large)   Pulse (!) 111   Ht 5\' 3"  (1.6 m)   Wt 234 lb (106.1 kg)   BMI 41.45 kg/m  BMI: Body mass index is 41.45 kg/m.  Physical Exam  Constitutional: She is oriented to person, place, and time. She appears well-developed and well-nourished.  HENT:  Head: Normocephalic and atraumatic.  Eyes: Right eye exhibits no discharge. Left eye exhibits no discharge.  Neck: Normal range of motion. No JVD present.  Cardiovascular: Normal rate, S1 normal, S2 normal and normal heart sounds. An irregularly irregular rhythm present. Exam reveals no distant heart sounds, no friction rub, no midsystolic click and no opening snap.  No murmur heard. Pulses:      Dorsalis pedis pulses are 1+ on the right side and 1+ on the left  side.       Posterior tibial pulses are 1+ on the right side and 1+ on the left side.  Heart rate during exam noted to be in the 30Q bpm.  2/6 systolic murmur along the right upper sternal border  Pulmonary/Chest: Effort normal and breath sounds normal. No respiratory distress. She has no decreased breath sounds. She has no wheezes. She has no rales. She exhibits no tenderness.  Abdominal: Soft. She exhibits no distension. There is no abdominal tenderness.  Musculoskeletal:        General: No edema.  Neurological: She is alert and oriented to person, place, and time.  Skin: Skin is warm and dry. No cyanosis. Nails show no clubbing.  Psychiatric: She has a normal mood and affect. Her speech is normal and behavior is normal. Judgment and thought content normal.     EKG:  Was ordered and interpreted by me today. Shows A. fib with RVR, 111 bpm, no acute ST-T changes  Recent Labs: 12/22/2018: ALT 13; BUN 18; Creatinine, Ser 0.88; Hemoglobin 13.8; Magnesium 1.9; Platelets 273; Potassium 4.3; Sodium 147; TSH 3.090  12/22/2018: Chol/HDL Ratio 2.4; Cholesterol, Total 170; HDL 70; LDL Calculated 86; Triglycerides 71   CrCl cannot be calculated (Patient's most recent lab result is older than the maximum 21 days allowed.).   Wt Readings from Last 3 Encounters:  01/19/19 234 lb (106.1 kg)  01/08/19 237 lb (107.5 kg)  12/26/18 236 lb 3.2 oz (107.1 kg)     Other studies reviewed: Additional studies/records reviewed today include: summarized above  ASSESSMENT AND PLAN:  1. Permanent A. Fib: Asymptomatic.  Ventricular rates suboptimally controlled in the office today in the 110s bpm.  Though on exam, heart rate improved to the 90s bpm.  She has been in documented A. fib since at least 10/2017.  Recent renal function, potassium and CBC were normal.  She redemonstrated intolerance to metoprolol even at low doses.  In this setting, we will not rechallenge her with beta blockade.  Continue long-acting  diltiazem 360 mg daily.  She prefers to avoid alternative rate controlling medications including digoxin at this time.  Continue Eliquis 5 mg twice daily.  2. PAD: No symptoms of claudication.  Continue medical therapy.  Smoking cessation is recommended as outlined below.  3. Aortic valve sclerosis: No significant stenosis on recent echocardiogram.  Continue to monitor clinically  4. Hypertension: Blood pressure reasonably controlled today.  Continue current medications.  5. Hyperlipidemia: LDL of 86 from 12/2018.  History of statin intolerance though tolerating pravastatin.    6. Tobacco abuse: Complete cessation is advised.  She has tapered her tobacco use from 2 packs daily to half pack daily.  She feels like she will never fully quit smoking.  Disposition: F/u with Dr. Fletcher Anon or an APP in 6 months, sooner if needed.  Current medicines are reviewed at length with the patient today.  The patient did not have any concerns regarding medicines.  Signed, Christell Faith, PA-C 01/19/2019 9:25 AM     [address], [place] [address] [phone]

## 2019-01-19 ENCOUNTER — Ambulatory Visit: Payer: PPO | Admitting: Cardiovascular Disease

## 2019-01-19 ENCOUNTER — Encounter: Payer: Self-pay | Admitting: Physician Assistant

## 2019-01-19 ENCOUNTER — Ambulatory Visit (INDEPENDENT_AMBULATORY_CARE_PROVIDER_SITE_OTHER): Payer: PPO | Admitting: Physician Assistant

## 2019-01-19 VITALS — BP 130/68 | HR 111 | Ht 63.0 in | Wt 234.0 lb

## 2019-01-19 DIAGNOSIS — I4821 Permanent atrial fibrillation: Secondary | ICD-10-CM | POA: Diagnosis not present

## 2019-01-19 DIAGNOSIS — E782 Mixed hyperlipidemia: Secondary | ICD-10-CM | POA: Diagnosis not present

## 2019-01-19 DIAGNOSIS — I739 Peripheral vascular disease, unspecified: Secondary | ICD-10-CM

## 2019-01-19 DIAGNOSIS — I10 Essential (primary) hypertension: Secondary | ICD-10-CM | POA: Diagnosis not present

## 2019-01-19 DIAGNOSIS — Z720 Tobacco use: Secondary | ICD-10-CM

## 2019-01-19 DIAGNOSIS — I358 Other nonrheumatic aortic valve disorders: Secondary | ICD-10-CM

## 2019-01-19 MED ORDER — ENALAPRIL MALEATE 20 MG PO TABS: ORAL_TABLET | ORAL | 3 refills | Status: DC

## 2019-01-19 NOTE — Patient Instructions (Signed)
Medication Instructions:  Your physician recommends that you continue on your current medications as directed. Please refer to the Current Medication list given to you today.  If you need a refill on your cardiac medications before your next appointment, please call your pharmacy.   Lab work: NONE ORDERED  If you have labs (blood work) drawn today and your tests are completely normal, you will receive your results only by: [name] MyChart Message (if you have MyChart) OR . A paper copy in the mail If you have any lab test that is abnormal or we need to change your treatment, we will call you to review the results.  Testing/Procedures: NONE ORDERED   Follow-Up: At [facility], you and your health needs are our priority.  As part of our continuing mission to provide you with exceptional heart care, we have created designated Provider Care Teams.  These Care Teams include your primary Cardiologist (physician) and Advanced Practice Providers (APPs -  Physician Assistants and Nurse Practitioners) who all work together to provide you with the care you need, when you need it. You will need a follow up appointment in 6 months.  Please call our office 2 months in advance to schedule this appointment.  You may see Kathlyn Sacramento, MD or Christell Faith, PA-C

## 2019-01-30 DIAGNOSIS — H353211 Exudative age-related macular degeneration, right eye, with active choroidal neovascularization: Secondary | ICD-10-CM | POA: Diagnosis not present

## 2019-02-19 ENCOUNTER — Other Ambulatory Visit: Payer: Self-pay | Admitting: Internal Medicine

## 2019-03-13 ENCOUNTER — Other Ambulatory Visit: Payer: Self-pay | Admitting: Internal Medicine

## 2019-03-13 NOTE — Telephone Encounter (Signed)
Pt states the pharmacy has been requesting RX since last week. Pt took last pill today.  [id number] - [place], [place]

## 2019-03-14 MED ORDER — APIXABAN 5 MG PO TABS: 5.0000 mg | ORAL_TABLET | Freq: Two times a day (BID) | ORAL | 2 refills | Status: DC

## 2019-03-26 DIAGNOSIS — H353211 Exudative age-related macular degeneration, right eye, with active choroidal neovascularization: Secondary | ICD-10-CM | POA: Diagnosis not present

## 2019-04-09 ENCOUNTER — Telehealth: Payer: Self-pay | Admitting: Internal Medicine

## 2019-04-09 NOTE — Telephone Encounter (Signed)
Copied from [place] [id number]. Topic: Quick Communication - Rx Refill/Question >> Apr 09, 2019  1:38 PM Ahmed Prima L wrote: Medication: FLUoxetine (PROZAC) 20 MG capsule  Has the patient contacted their pharmacy? yes (Agent: If no, request that the patient contact the pharmacy for the refill.) (Agent: If yes, when and what did the pharmacy advise?)  Preferred Pharmacy (with phone number or street name): [id number] [address], [place] - [address] [place] [address] Phone: [phone] Fax: [phone]    Agent: Please be advised that RX refills may take up to 3 business days. We ask that you follow-up with your pharmacy.

## 2019-04-10 ENCOUNTER — Other Ambulatory Visit: Payer: Self-pay

## 2019-04-10 MED ORDER — FLUOXETINE HCL 20 MG PO CAPS: ORAL_CAPSULE | ORAL | 0 refills | Status: DC

## 2019-04-10 NOTE — Telephone Encounter (Signed)
rx sent in 

## 2019-04-24 ENCOUNTER — Telehealth: Payer: Self-pay

## 2019-04-24 ENCOUNTER — Other Ambulatory Visit: Payer: Self-pay | Admitting: Internal Medicine

## 2019-04-24 DIAGNOSIS — E7800 Pure hypercholesterolemia, unspecified: Secondary | ICD-10-CM

## 2019-04-24 DIAGNOSIS — R739 Hyperglycemia, unspecified: Secondary | ICD-10-CM | POA: Diagnosis not present

## 2019-04-24 NOTE — Telephone Encounter (Signed)
Copied from [place] [id number]. Topic: General - Inquiry >> Apr 23, 2019  3:36 PM Mathis Bud wrote: Reason for CRM: Patient is calling stating she has a appt with a [facility] tomorrow 5/19 at 830AM.  Patient states [facility] does not have a referral for labs for her.  Patient has an appt with PCP on 5/21.  Patient recalls "someone" telling her they put in labs on the 1/29.   Patient is going to [place]: [address] [place] [address] Call back # [phone]

## 2019-04-24 NOTE — Telephone Encounter (Signed)
Labs ordered for [facility].

## 2019-04-25 LAB — LIPID PANEL
Chol/HDL Ratio: 2.7 ratio (ref 0.0–4.4)
Cholesterol, Total: 169 mg/dL (ref 100–199)
HDL: 62 mg/dL (ref >39)
LDL Calculated: 92 mg/dL (ref 0–99)
Triglycerides: 74 mg/dL (ref 0–149)
VLDL Cholesterol Cal: 15 mg/dL (ref 5–40)

## 2019-04-25 LAB — HEPATIC FUNCTION PANEL
ALT: 14 IU/L (ref 0–32)
AST: 16 IU/L (ref 0–40)
Albumin: 4.5 g/dL (ref 3.7–4.7)
Alkaline Phosphatase: 56 IU/L (ref 39–117)
Bilirubin Total: 1.1 mg/dL (ref 0.0–1.2)
Bilirubin, Direct: 0.27 mg/dL (ref 0.00–0.40)
Total Protein: 7 g/dL (ref 6.0–8.5)

## 2019-04-25 LAB — BASIC METABOLIC PANEL
BUN/Creatinine Ratio: 20 (ref 12–28)
BUN: 19 mg/dL (ref 8–27)
CO2: 24 mmol/L (ref 20–29)
Calcium: 9.5 mg/dL (ref 8.7–10.3)
Chloride: 99 mmol/L (ref 96–106)
Creatinine, Ser: 0.93 mg/dL (ref 0.57–1.00)
GFR calc Af Amer: 69 mL/min/{1.73_m2} (ref >59)
GFR calc non Af Amer: 59 mL/min/{1.73_m2} — ABNORMAL LOW (ref >59)
Glucose: 104 mg/dL — ABNORMAL HIGH (ref 65–99)
Potassium: 3.8 mmol/L (ref 3.5–5.2)
Sodium: 138 mmol/L (ref 134–144)

## 2019-04-25 LAB — HEMOGLOBIN A1C
Est. average glucose Bld gHb Est-mCnc: 111 mg/dL
Hgb A1c MFr Bld: 5.5 % (ref 4.8–5.6)

## 2019-04-26 ENCOUNTER — Ambulatory Visit (INDEPENDENT_AMBULATORY_CARE_PROVIDER_SITE_OTHER): Payer: PPO | Admitting: Internal Medicine

## 2019-04-26 ENCOUNTER — Other Ambulatory Visit: Payer: Self-pay

## 2019-04-26 ENCOUNTER — Encounter: Payer: Self-pay | Admitting: Internal Medicine

## 2019-04-26 DIAGNOSIS — I10 Essential (primary) hypertension: Secondary | ICD-10-CM | POA: Diagnosis not present

## 2019-04-26 DIAGNOSIS — F419 Anxiety disorder, unspecified: Secondary | ICD-10-CM | POA: Diagnosis not present

## 2019-04-26 DIAGNOSIS — E7800 Pure hypercholesterolemia, unspecified: Secondary | ICD-10-CM | POA: Diagnosis not present

## 2019-04-26 DIAGNOSIS — M79674 Pain in right toe(s): Secondary | ICD-10-CM | POA: Diagnosis not present

## 2019-04-26 DIAGNOSIS — J449 Chronic obstructive pulmonary disease, unspecified: Secondary | ICD-10-CM | POA: Diagnosis not present

## 2019-04-26 DIAGNOSIS — R739 Hyperglycemia, unspecified: Secondary | ICD-10-CM | POA: Diagnosis not present

## 2019-04-26 DIAGNOSIS — Z720 Tobacco use: Secondary | ICD-10-CM

## 2019-04-26 DIAGNOSIS — I4821 Permanent atrial fibrillation: Secondary | ICD-10-CM | POA: Diagnosis not present

## 2019-04-26 NOTE — Progress Notes (Signed)
Patient ID: Danielle Yates, female   DOB: 1941/04/21, 78 y.o.   MRN: [id number]   Virtual Visit via telephone Note  This visit type was conducted due to national recommendations for restrictions regarding the COVID-19 pandemic (e.g. social distancing).  This format is felt to be most appropriate for this patient at this time.  All issues noted in this document were discussed and addressed.  No physical exam was performed (except for noted visual exam findings with Video Visits).   I connected with Danielle Yates by a telephone enabled telemedicine application and verified that I am speaking with the correct person using two identifiers. Location patient: home Location provider: work Persons participating in the telephone visit: patient, provider  I discussed the limitations, risks, security and privacy concerns of performing an evaluation and management service by telephone and the availability of in person appointments. . The patient expressed understanding and agreed to proceed.   Reason for visit: scheduled follow up.    HPI: She reports she is doing relatively well.  Sees cardiology for f/u of her afib.  On eliquis.  Taking diltiazem.  No increased hear rate or palpitations.  No chest pain. Staying in due to COVID restrictions.  No fever.  No cough, congestion or sob.  Breathing doing well. Off breo. Still using spiriva.  Still smoking.  Discussed the need to quit.  She declines to quit at this time.  No acid reflux.  No abdominal pain.  Bowels moving.  Still watching her diet.  Has continued to lose weight.  Taking prozac.  Feels she is doing well on this medication.  Her brother recently passed away. Had metastatic prostate cancer.  Overall she feels she is coping well.  Has good support.  Sleeping ok.  Occasional sinus congestion.  Takes mucinex prn.  Controlled.  Three weeks ago, she fell on her knees.  No knee injury.  Bent 4 toes back.  Third toe is still swollen some and still hurts.   Able to walk without problems.  Declines further evaluation.  Discussed lab results.     ROS: See pertinent positives and negatives per HPI.  Past Medical History:  Diagnosis Date   Allergy    Aortic valve sclerosis    a. TTE 2013: EF 60-65%, no RWMA, Gr1DD, trivial MR, mildly dilated LA 43 mm; b. TTE 08/2017: EF 65-70%, mild LVH, aortic valve sclerosis without stenosis, mildly dilated LA 36 mm, RV size and sys fxn nl   Arthropathy, unspecified, site unspecified    Cataract    Chronic rhinitis    Disorder of bone and cartilage, unspecified    Diverticulosis of colon (without mention of hemorrhage)    Essential hypertension    a. refractory HTN; b. renal US 2013 negative for RAS   External hemorrhoids without mention of complication    Medication intolerance    Obesity, unspecified    Other abnormal glucose    Panic disorder without agoraphobia    Peripheral arterial disease ([place])    a.  Known bilateral common iliac disease-> medical therapy-asymptomatic.   Persistent atrial fibrillation    a. diagnosed 10/19/17; b. CHADS2VASc 5 (HTN, age x 2, vascular disease, female)-->Eliquis   Pure hypercholesterolemia    Tobacco use disorder    Unspecified vitamin D deficiency     Past Surgical History:  Procedure Laterality Date   [place]   with open wound   EYE SURGERY  2011   CATARACTS -  BOTH   Removed spot on face     [name]   TONSILLECTOMY AND ADENOIDECTOMY  1946   VAGINAL HYSTERECTOMY  1982   fibroid and endometriosis; one ovary remaining    Family History  Problem Relation Age of Onset   Hypertension Sister    Heart disease Brother    Hypertension Brother    Hyperlipidemia Brother    Aneurysm Mother    Rheumatic fever Mother        as an adult   Heart disease Father        MI x 2    SOCIAL HX: reviewed.    Current Outpatient Medications:    albuterol (PROVENTIL HFA;VENTOLIN HFA) 108 (90 Base)  MCG/ACT inhaler, Inhale 2 puffs into the lungs every 6 (six) hours as needed for wheezing or shortness of breath., Disp: 1 Inhaler, Rfl: 2   apixaban (ELIQUIS) 5 MG TABS tablet, Take 1 tablet (5 mg total) by mouth 2 (two) times daily., Disp: 180 tablet, Rfl: 2   diltiazem (TIAZAC) 360 MG 24 hr capsule, TAKE 1 CAPSULE(360 MG) BY MOUTH DAILY, Disp: 90 capsule, Rfl: 2   enalapril (VASOTEC) 20 MG tablet, Take one tablet in the am & two tablets in the pm., Disp: 90 tablet, Rfl: 3   FLUoxetine (PROZAC) 20 MG capsule, TAKE 1 CAPSULE(20 MG) BY MOUTH DAILY, Disp: 90 capsule, Rfl: 0   fluticasone (FLONASE) 50 MCG/ACT nasal spray, INSTILL 2 SPRAYS IN EACH NOSTRIL DAILY, Disp: 48 g, Rfl: 2   pravastatin (PRAVACHOL) 10 MG tablet, Take 1 tablet (10 mg total) by mouth daily., Disp: 90 tablet, Rfl: 3   Probiotic Product (PROBIOTIC DAILY PO), Take 1 tablet by mouth., Disp: , Rfl:    SPIRIVA RESPIMAT 1.25 MCG/ACT AERS, INHALE 2 PUFFS INTO THE LUNGS TWICE DAILY, Disp: 4 g, Rfl: 3   triamterene-hydrochlorothiazide (MAXZIDE-25) 37.5-25 MG tablet, Take 1 tablet by mouth daily., Disp: 90 tablet, Rfl: 1   VITAMIN D, CHOLECALCIFEROL, PO, Take by mouth daily., Disp: , Rfl:   EXAM:  VITALS per patient if applicable:  Temp 03.5, weight 231.4 pounds, O2 sat:  98%, pulse 89.    PSYCH/NEURO: pleasant and cooperative, no obvious depression or anxiety, speech and thought processing grossly intact  ASSESSMENT AND PLAN:  Discussed the following assessment and plan:  Anxiety  Permanent atrial fibrillation  Chronic obstructive pulmonary disease, unspecified COPD type ([place])  Essential hypertension, benign - Plan: Basic metabolic panel  Hyperglycemia - Plan: Hemoglobin A1c  Pure hypercholesterolemia - Plan: Hepatic function panel, Lipid panel  Tobacco abuse  Toe pain, right  Anxiety Stable on prozac.   Atrial fibrillation ([place]) Followed by cardiology.  On eliquis. Stable.    Chronic obstructive  pulmonary disease ([facility]) Followed by pulmonary.  Breathing stable.  Continues spiriva.    Essential hypertension, benign Blood pressure has been under reasonable control.  Continue current medication regimen.  Follow pressures.  Follow metabolic panel.   Hyperglycemia Low carb diet and exercise.  Follow met b and a1c.   Pure hypercholesterolemia On pravastatin.  Tolerating.  Low cholesterol diet and exercise.  Follow lipid panel and liver function tests.    Tobacco abuse Discussed the need to stop smoking.  She declines to stop.  Follow.   Toe pain, right Recent injury as outlined.  Declines xray or further evaluation.  Follow.      I discussed the assessment and treatment plan with the patient. The patient was provided an opportunity to ask questions and all were  answered. The patient agreed with the plan and demonstrated an understanding of the instructions.   The patient was advised to call back or seek an in-person evaluation if the symptoms worsen or if the condition fails to improve as anticipated.  I provided 25 minutes of non-face-to-face time during this encounter.   Einar Pheasant, MD

## 2019-04-29 ENCOUNTER — Encounter: Payer: Self-pay | Admitting: Internal Medicine

## 2019-04-29 DIAGNOSIS — M79674 Pain in right toe(s): Secondary | ICD-10-CM | POA: Insufficient documentation

## 2019-04-29 NOTE — Assessment & Plan Note (Signed)
Stable on prozac.  

## 2019-04-29 NOTE — Assessment & Plan Note (Signed)
Discussed the need to stop smoking.  She declines to stop.  Follow.

## 2019-04-29 NOTE — Assessment & Plan Note (Signed)
On pravastatin.  Tolerating.  Low cholesterol diet and exercise.  Follow lipid panel and liver function tests.

## 2019-04-29 NOTE — Assessment & Plan Note (Signed)
Followed by pulmonary.  Breathing stable.  Continues spiriva.

## 2019-04-29 NOTE — Assessment & Plan Note (Signed)
Blood pressure has been under reasonable control.  Continue current medication regimen.  Follow pressures.  Follow metabolic panel.  

## 2019-04-29 NOTE — Assessment & Plan Note (Signed)
Followed by cardiology.  On eliquis.  Stable.   

## 2019-04-29 NOTE — Assessment & Plan Note (Signed)
Recent injury as outlined.  Declines xray or further evaluation.  Follow.

## 2019-04-29 NOTE — Assessment & Plan Note (Signed)
Low carb diet and exercise.  Follow met b and a1c.  

## 2019-05-07 ENCOUNTER — Telehealth: Payer: Self-pay | Admitting: Internal Medicine

## 2019-05-07 DIAGNOSIS — E7800 Pure hypercholesterolemia, unspecified: Secondary | ICD-10-CM

## 2019-05-07 DIAGNOSIS — R739 Hyperglycemia, unspecified: Secondary | ICD-10-CM

## 2019-05-07 DIAGNOSIS — I10 Essential (primary) hypertension: Secondary | ICD-10-CM

## 2019-05-07 NOTE — Telephone Encounter (Signed)
Pt has an appt with Dr. Nicki Reaper on 09/07/2019. Lab orders are in, but pt is going to [facility] to have labs done. Please up date so [facility] will be able to see orders. Thank you.

## 2019-05-16 NOTE — Telephone Encounter (Signed)
Labs ordered for [facility] ?

## 2019-05-18 ENCOUNTER — Other Ambulatory Visit: Payer: Self-pay | Admitting: Cardiovascular Disease

## 2019-05-21 DIAGNOSIS — H353211 Exudative age-related macular degeneration, right eye, with active choroidal neovascularization: Secondary | ICD-10-CM | POA: Diagnosis not present

## 2019-06-13 DIAGNOSIS — D229 Melanocytic nevi, unspecified: Secondary | ICD-10-CM | POA: Diagnosis not present

## 2019-06-13 DIAGNOSIS — L720 Epidermal cyst: Secondary | ICD-10-CM | POA: Diagnosis not present

## 2019-06-13 DIAGNOSIS — L739 Follicular disorder, unspecified: Secondary | ICD-10-CM | POA: Diagnosis not present

## 2019-06-13 DIAGNOSIS — L821 Other seborrheic keratosis: Secondary | ICD-10-CM | POA: Diagnosis not present

## 2019-06-13 DIAGNOSIS — Z85828 Personal history of other malignant neoplasm of skin: Secondary | ICD-10-CM | POA: Diagnosis not present

## 2019-06-13 DIAGNOSIS — Z1283 Encounter for screening for malignant neoplasm of skin: Secondary | ICD-10-CM | POA: Diagnosis not present

## 2019-06-13 DIAGNOSIS — L814 Other melanin hyperpigmentation: Secondary | ICD-10-CM | POA: Diagnosis not present

## 2019-06-21 ENCOUNTER — Other Ambulatory Visit: Payer: Self-pay | Admitting: Internal Medicine

## 2019-06-21 MED ORDER — SPIRIVA RESPIMAT 1.25 MCG/ACT IN AERS: 2.0000 | INHALATION_SPRAY | Freq: Every day | RESPIRATORY_TRACT | 3 refills | Status: DC

## 2019-07-02 ENCOUNTER — Telehealth: Payer: Self-pay | Admitting: Internal Medicine

## 2019-07-02 ENCOUNTER — Other Ambulatory Visit: Payer: Self-pay | Admitting: Physician Assistant

## 2019-07-02 NOTE — Telephone Encounter (Signed)
triamterene-hydrochlorothiazide (MAXZIDE-25) 37.5-25 MG tablet FLUoxetine (PROZAC) 20 MG capsule  Send to [facility]

## 2019-07-03 ENCOUNTER — Other Ambulatory Visit: Payer: Self-pay

## 2019-07-03 MED ORDER — TRIAMTERENE-HCTZ 37.5-25 MG PO TABS: 1.0000 | ORAL_TABLET | Freq: Every day | ORAL | 1 refills | Status: DC

## 2019-07-03 MED ORDER — FLUOXETINE HCL 20 MG PO CAPS: ORAL_CAPSULE | ORAL | 1 refills | Status: DC

## 2019-07-03 NOTE — Telephone Encounter (Signed)
Medications sent in.

## 2019-07-18 ENCOUNTER — Ambulatory Visit (INDEPENDENT_AMBULATORY_CARE_PROVIDER_SITE_OTHER): Payer: PPO | Admitting: Internal Medicine

## 2019-07-18 ENCOUNTER — Encounter: Payer: Self-pay | Admitting: Internal Medicine

## 2019-07-18 ENCOUNTER — Other Ambulatory Visit: Payer: Self-pay

## 2019-07-18 DIAGNOSIS — J449 Chronic obstructive pulmonary disease, unspecified: Secondary | ICD-10-CM | POA: Diagnosis not present

## 2019-07-18 DIAGNOSIS — Z720 Tobacco use: Secondary | ICD-10-CM

## 2019-07-18 DIAGNOSIS — F17210 Nicotine dependence, cigarettes, uncomplicated: Secondary | ICD-10-CM | POA: Diagnosis not present

## 2019-07-18 NOTE — Patient Instructions (Signed)
STOP SMOKING  Continue inhalers as prescribed   

## 2019-07-18 NOTE — Progress Notes (Signed)
Name: Danielle Yates MRN: [id number] DOB: [birth year]     I connected with the patient by telephone enabled telemedicine visit and verified that I am speaking with the correct person using two identifiers.    I discussed the limitations, risks, security and privacy concerns of performing an evaluation and management service by telemedicine and the availability of in-person appointments. I also discussed with the patient that there may be a patient responsible charge related to this service. The patient expressed understanding and agreed to proceed.  PATIENT AGREES AND CONFIRMS -YES   Other persons participating in the visit and their role in the encounter: Patient, nursing   Patient's location: Home Provider's location: Clinic   I discussed the limitations, risks, security and privacy concerns of performing an evaluation and management service by telephone and the availability of in person appointments. I also discussed with the patient that there may be a patient responsible charge related to this service. The patient expressed understanding and agreed to proceed.  This visit type was conducted due to national recommendations for restrictions regarding the COVID-19 Pandemic (e.g. social distancing).  This format is felt to be most appropriate for this patient at this time.  All issues noted in this document were discussed and addressed.         REFERRING MD : ARIDA  CHIEF COMPLAINT: cough and SOB  STUDIES:     CXR independently reviewed by Me +flattenend diaphragms Low lung volumes  Office Spirometry Ratio 64% Fev1 1.5L 79% FEF25/75 0.8L 51%predicted Interpretation Moderate Obstructive airways disease      HISTORY OF PRESENT ILLNESS:  Longtime smoking history approximately 50-pack-year abuse No signs of exacerbation at this time No signs of infection at this time Continues to smoke 1 pack a day   Continues to use Spiriva once daily Albuterol infrequently used  Refuses oxygen therapy and CT scan  Patient qualifies for oxygen at nighttime however she refuses to wear oxygen She knows that she may die at any time and at night and she is not using her oxygen she does not want to use oxygen at nighttime   6-minute walk test was within normal limits    Smoking Assessment and Cessation Counseling   Upon further questioning, Patient smokes 1/2 PPD  I have advised patient to quit/stop smoking as soon as possible due to high risk for multiple medical problems  Patient  is NOT willing to quit smoking  I have advised patient that we can assist and have options of Nicotine replacement therapy. I also advised patient on behavioral therapy and can provide oral medication therapy in conjunction with the other therapies  Follow up next Office visit  for assessment of smoking cessation  Smoking cessation counseling advised for 4 minutes  Smoking cessation strongly   o2 sat 98% weight 233 T97  BP 121/83 patient report  Review of Systems:  Gen:  Denies  fever, sweats, chills weight loss  HEENT: Denies blurred vision, double vision, ear pain, eye pain, hearing loss, nose bleeds, sore throat Cardiac:  No dizziness, chest pain or heaviness, chest tightness,edema, No JVD Resp:   No cough, -sputum production, +shortness of breath,-wheezing, -hemoptysis,  Gi: Denies swallowing difficulty, stomach pain, nausea or vomiting, diarrhea, constipation, bowel incontinence Gu:  Denies bladder incontinence, burning urine Ext:   Denies Joint pain, stiffness or swelling Skin: Denies  skin rash, easy bruising or bleeding or hives Endoc:  Denies polyuria, polydipsia , polyphagia or weight change Psych:   Denies depression,  insomnia or hallucinations  Other:  All other systems negative       ASSESSMENT / PLAN:  78 year old white female seen today for follow-up COPD with ongoing tobacco abuse in the setting of deconditioned state with chronic hypoxic respiratory  failure from COPD  At this time patient refuses to wear oxygen therapy Patient refuses to obtain lung cancer screening referral program Patient refuses to obtain CT scan    Shortness of breath and wheezing Dyspnea and exertion Moderate COPD Gold stage C  Moderate COPD Gold stage C Continue Spiriva Respimat Continue albuterol as needed  Smoking cessation strongly advised She refuses to stop smoking at this time   Chronic hypoxic respiratory failure from COPD At this time patient refuses to wear oxygen therapy I have explained to her she is at high risk for arrhythmias and death if she does not wear her oxygen Patient understands her risks  Obesity -recommend significant weight loss -recommend changing diet  Deconditioned state -Recommend increased daily activity and exercise  Extensive smoking history Patient is a candidate for lung cancer screening referral program however at this time she refuses to obtain CT scan   COVID-19 EDUCATION: The signs and symptoms of COVID-19 were discussed with the patient and how to seek care for testing.  The importance of social distancing was discussed today. Hand Washing Techniques and avoid touching face was advised.  MEDICATION ADJUSTMENTS/LABS AND TESTS ORDERED: Continue Spiriva Respimat Continue albuterol as needed   CURRENT MEDICATIONS REVIEWED AT LENGTH WITH PATIENT TODAY   Patient satisfied with Plan of action and management. All questions answered  Follow up in 1 year  Total time spent 27 minutes  Corrin Parker, M.D.  [facility]  Medical Director [place] Director [facility] Cardio-Pulmonary Department

## 2019-07-19 ENCOUNTER — Telehealth: Payer: Self-pay | Admitting: Cardiovascular Disease

## 2019-07-24 NOTE — Progress Notes (Signed)
Virtual Visit via Video Note   This visit type was conducted due to national recommendations for restrictions regarding the COVID-19 Pandemic (e.g. social distancing) in an effort to limit this patient's exposure and mitigate transmission in our community.  Due to her co-morbid illnesses, this patient is at least at moderate risk for complications without adequate follow up.  This format is felt to be most appropriate for this patient at this time.  All issues noted in this document were discussed and addressed.  A limited physical exam was performed with this format.  Please refer to the patient's chart for her consent to telehealth for [facility].   Date:  07/27/2019   ID:  Danielle Yates, DOB 1941/03/04, MRN [id number]  Patient Location: Home Provider Location: Office  PCP:  Danielle Pheasant, MD  Cardiologist:  Danielle Sacramento, MD  Electrophysiologist:  None   Evaluation Performed:  Follow-Up Visit  Chief Complaint:  Follow up  History of Present Illness:    Danielle Yates is a 78 y.o. female with history of permanent A. fib on Eliquis diagnosed in 10/2017, refractory hypertension, PAD, COPD with ongoing tobacco abuse, hyperlipidemia, and obesity who presents for virtual follow-up of her A. Fib.  She has previously not tolerated metoprolol due to bradycardia and fatigue. She has not tolerated amlodipine or Coreg secondary to headache and dizziness. She is also known to be intolerant to statins, though has been tolerating pravastatin. She has previously undergone echo in 2013 that showed normal LV systolic function with an EF of 60-65%, no RWMA, Gr1DD, trivial mitral regurgitation, and a mildly dilated left atrium measuring 43 mm. Renal artery ultrasound in 2013 showed no evidence of RAS. She has known bilateral common iliac artery disease that is being managed medically given lack of claudication symptoms. She was diagnosed with A. fib with RVR in 10/2017 and was started on Eliquis  at that time given a CHADS2VASc of 5.  She was asymptomatic.  Echo in 08/2017 showed normal LV systolic function with an EF of 65 to 70%, mild LVH, aortic valve sclerosis without stenosis, mildly dilated left atrium, RV cavity size is normal with normal RV systolic function.  She was seen in the office in 07/2018 and was doing reasonably well.  She continued to be in A. fib and was asymptomatic.  She had lost 10 pounds over the prior 6 months with improved diet.  She was trying to taper her tobacco use.  She denied any claudication.  Given her ventricular rate was not optimally controlled on maximal dose diltiazem she was started on Lopressor 25 mg twice daily.  Unfortunately, she called back 6 days later noting she did not tolerate Lopressor 25 mg twice daily or 12.5 mg twice daily.  It was recommended she discontinue this medication at that time. She was seen in the office in 01/2019 for follow up and was doing well. She remained in Afib. Her weight was down another 9 pounds in the setting of a healthier diet. She reported well controlled heart rates at home. She was under increased stress at home. Her ventricular rate was noted to be 111 bpm. She felt like her heart rate was in the setting of stress and being at the cardiology office. With rest, her heart rate improved to the 90s bpm. She was asymptomatic and preferred no changes.   She indicates she is doing well from a cardiac perspective.  She denies any chest pain, shortness of breath, palpitations, dizziness, presyncope, or  syncope.  She did have one mechanical fall in late 03/2019 in which she got up to void in the middle the night and tripped over a rug.  She did suffer an injury to 1 of her toes that has subsequently improved.  She did not hit her head or suffer LOC.  She denies any BRBPR or melena.  She continues to work on a heart healthy diet and has transitioned from Diet Coke to water.  Initially, she was drinking [employer] and with this  began to notice bilateral ankle swelling.  Upon reviewing the label of this water she found it had a small amount of salt in it as a preservative.  Since transitioning to alternative brand of bottled water she has noted resolution of bilateral ankle swelling.  Her weight remains stable.  She denies any orthopnea, abdominal distention, PND, early satiety.  Blood pressure remains well controlled.  She does not have any issues or concerns today.   Labs: 04/2019 - A1c 5.5, albumin 4.5, AST/ALT normal, TC 169, TG 74, HDL 62, LDL 92, SCr 0.93, K+ 3.8 12/2018 - CBC unremarkable, TSH normal, magnesium 1.9    The patient does not have symptoms concerning for COVID-19 infection (fever, chills, cough, or new shortness of breath).    Past Medical History:  Diagnosis Date   Allergy    Aortic valve sclerosis    a. TTE 2013: EF 60-65%, no RWMA, Gr1DD, trivial MR, mildly dilated LA 43 mm; b. TTE 08/2017: EF 65-70%, mild LVH, aortic valve sclerosis without stenosis, mildly dilated LA 36 mm, RV size and sys fxn nl   Arthropathy, unspecified, site unspecified    Cataract    Chronic rhinitis    Disorder of bone and cartilage, unspecified    Diverticulosis of colon (without mention of hemorrhage)    Essential hypertension    a. refractory HTN; b. renal US 2013 negative for RAS   External hemorrhoids without mention of complication    Medication intolerance    Obesity, unspecified    Other abnormal glucose    Panic disorder without agoraphobia    Peripheral arterial disease ([place])    a.  Known bilateral common iliac disease-> medical therapy-asymptomatic.   Persistent atrial fibrillation    a. diagnosed 10/19/17; b. CHADS2VASc 5 (HTN, age x 2, vascular disease, female)-->Eliquis   Pure hypercholesterolemia    Tobacco use disorder    Unspecified vitamin D deficiency    Past Surgical History:  Procedure Laterality Date   [place]   with open wound    EYE SURGERY  2011   CATARACTS -  BOTH   Removed spot on face     [place]   fibroid and endometriosis; one ovary remaining     Current Meds  Medication Sig   albuterol (PROVENTIL HFA;VENTOLIN HFA) 108 (90 Base) MCG/ACT inhaler Inhale 2 puffs into the lungs every 6 (six) hours as needed for wheezing or shortness of breath.   apixaban (ELIQUIS) 5 MG TABS tablet Take 1 tablet (5 mg total) by mouth 2 (two) times daily.   diltiazem (TIAZAC) 360 MG 24 hr capsule TAKE 1 CAPSULE(360 MG) BY MOUTH DAILY   enalapril (VASOTEC) 20 MG tablet TAKE 1 TABLET BY MOUTH DAILY IN THE MORNING, AND 2 TABLETS DAILY IN THE AFTERNOON   FLUoxetine (PROZAC) 20 MG capsule TAKE 1 CAPSULE(20 MG) BY MOUTH DAILY  fluticasone (FLONASE) 50 MCG/ACT nasal spray INSTILL 2 SPRAYS IN EACH NOSTRIL DAILY   pravastatin (PRAVACHOL) 10 MG tablet Take 1 tablet (10 mg total) by mouth daily.   Probiotic Product (PROBIOTIC DAILY PO) Take 1 tablet by mouth.   Tiotropium Bromide Monohydrate (SPIRIVA RESPIMAT) 1.25 MCG/ACT AERS Inhale 2 puffs into the lungs daily.   triamterene-hydrochlorothiazide (MAXZIDE-25) 37.5-25 MG tablet Take 1 tablet by mouth daily.   VITAMIN D, CHOLECALCIFEROL, PO Take by mouth daily.     Allergies:   Amlodipine, Codeine, Coreg [carvedilol], Crestor [rosuvastatin], Metoprolol, Statins, and Nickel   Social History   Tobacco Use   Smoking status: Current Every Day Smoker    Packs/day: 0.50    Years: 50.00    Pack years: 25.00    Types: Cigarettes   Smokeless tobacco: Never Used   Tobacco comment: 10 a day  Substance Use Topics   Alcohol use: No    Alcohol/week: 0.0 standard drinks   Drug use: No     Family Hx: The patient's family history includes Aneurysm in her mother; Heart disease in her brother and father; Hyperlipidemia in her brother; Hypertension in her brother and sister; Rheumatic fever in her  mother.  ROS:   Please see the history of present illness.     All other systems reviewed and are negative.   Prior CV studies:   The following studies were reviewed today:  2D Echo 08/2017: - Left ventricle: The cavity size was normal. Wall thickness was   increased in a pattern of mild LVH. Systolic function was   vigorous. The estimated ejection fraction was in the range of 65%   to 70%. - Aortic valve: Trileaflet; mildly thickened leaflets. Sclerosis   without stenosis. - Mitral valve: Mildly thickened leaflets . - Left atrium: The atrium was mildly dilated. - Right ventricle: The cavity size was normal. Systolic function   was normal.  Labs/Other Tests and Data Reviewed:    EKG:  No ECG reviewed.  Recent Labs: 12/22/2018: Hemoglobin 13.8; Magnesium 1.9; Platelets 273; TSH 3.090 04/24/2019: ALT 14; BUN 19; Creatinine, Ser 0.93; Potassium 3.8; Sodium 138   Recent Lipid Panel Lab Results  Component Value Date/Time   CHOL 169 04/24/2019 08:56 AM   TRIG 74 04/24/2019 08:56 AM   HDL 62 04/24/2019 08:56 AM   CHOLHDL 2.7 04/24/2019 08:56 AM   LDLCALC 92 04/24/2019 08:56 AM    Wt Readings from Last 3 Encounters:  07/27/19 234 lb (106.1 kg)  04/26/19 231 lb 6.4 oz (105 kg)  01/19/19 234 lb (106.1 kg)     Objective:    Vital Signs:  BP 123/84    Pulse 89    Temp 97.7 F (36.5 C)    Ht 5\' 3"  (1.6 m)    Wt 234 lb (106.1 kg)    SpO2 98%    BMI 41.45 kg/m    VITAL SIGNS:  reviewed  ASSESSMENT & PLAN:    1. Permanent A. Fib: She continues to do very well.  She is asymptomatic with well-controlled ventricular rates.  Continue current dose of diltiazem 360 mg daily along with Eliquis 5 mg twice daily.  She denies any symptoms concerning for bleeding.  Recent CBC earlier this month unremarkable.  2. PAD: She denies any symptoms concerning for claudication.  She does note some left hip pain when initially starting to climb steps which is felt to be arthritic in etiology.   Smoking cessation continues to be recommended.  3. HTN: Patient  reported blood pressure is well controlled this morning.  4. HLD: LDL of 92 from 04/2019.  History of statin intolerance, though tolerating pravastatin.  Followed by PCP.  5. Tobacco abuse: She continues to smoke approximately 10 cigarettes/day which is improved from prior to pack per day.  Complete cessation is recommended for the patient feels like she will never be able to fully quit smoking.  COVID-19 Education: The signs and symptoms of COVID-19 were discussed with the patient and how to seek care for testing (follow up with PCP or arrange E-visit).  The importance of social distancing was discussed today.  Time:   Today, I have spent 12 minutes with the patient with telehealth technology discussing the above problems.     Medication Adjustments/Labs and Tests Ordered: Current medicines are reviewed at length with the patient today.  Concerns regarding medicines are outlined above.   Tests Ordered: No orders of the defined types were placed in this encounter.   Medication Changes: No orders of the defined types were placed in this encounter.   Follow Up:  In Person in 6 month(s)  Signed, Christell Faith, PA-C  07/27/2019 7:54 AM    [facility]

## 2019-07-27 ENCOUNTER — Telehealth (INDEPENDENT_AMBULATORY_CARE_PROVIDER_SITE_OTHER): Payer: PPO | Admitting: Physician Assistant

## 2019-07-27 ENCOUNTER — Other Ambulatory Visit: Payer: Self-pay

## 2019-07-27 ENCOUNTER — Encounter: Payer: Self-pay | Admitting: Physician Assistant

## 2019-07-27 VITALS — BP 123/84 | HR 89 | Temp 97.7°F | Ht 63.0 in | Wt 234.0 lb

## 2019-07-27 DIAGNOSIS — E782 Mixed hyperlipidemia: Secondary | ICD-10-CM

## 2019-07-27 DIAGNOSIS — Z720 Tobacco use: Secondary | ICD-10-CM

## 2019-07-27 DIAGNOSIS — I739 Peripheral vascular disease, unspecified: Secondary | ICD-10-CM

## 2019-07-27 DIAGNOSIS — I10 Essential (primary) hypertension: Secondary | ICD-10-CM | POA: Diagnosis not present

## 2019-07-27 DIAGNOSIS — E785 Hyperlipidemia, unspecified: Secondary | ICD-10-CM | POA: Diagnosis not present

## 2019-07-27 DIAGNOSIS — I4821 Permanent atrial fibrillation: Secondary | ICD-10-CM

## 2019-07-27 DIAGNOSIS — Z7901 Long term (current) use of anticoagulants: Secondary | ICD-10-CM | POA: Diagnosis not present

## 2019-07-27 DIAGNOSIS — I482 Chronic atrial fibrillation, unspecified: Secondary | ICD-10-CM

## 2019-07-27 NOTE — Patient Instructions (Signed)
It was a pleasure to speak with you on the phone today! Thank you for allowing [place] to continue taking care of your [facility] needs during this time.   Feel free to call as needed for questions and concerns related to your cardiac needs.   Medication Instructions:  Your physician recommends that you continue on your current medications as directed. Please refer to the Current Medication list given to you today.  If you need a refill on your cardiac medications before your next appointment, please call your pharmacy.   Lab work: None ordered  If you have labs (blood work) drawn today and your tests are completely normal, you will receive your results only by: [name] MyChart Message (if you have MyChart) OR . A paper copy in the mail If you have any lab test that is abnormal or we need to change your treatment, we will call you to review the results.  Testing/Procedures: None ordered   Follow-Up: At [facility], you and your health needs are our priority.  As part of our continuing mission to provide you with exceptional heart care, we have created designated Provider Care Teams.  These Care Teams include your primary Cardiologist (physician) and Advanced Practice Providers (APPs -  Physician Assistants and Nurse Practitioners) who all work together to provide you with the care you need, when you need it. You will need a follow up in person appointment in 6 months.  Please call our office 2 months in advance to schedule this appointment.  You may see Kathlyn Sacramento, MD or Christell Faith, PA-C.

## 2019-08-06 DIAGNOSIS — H353211 Exudative age-related macular degeneration, right eye, with active choroidal neovascularization: Secondary | ICD-10-CM | POA: Diagnosis not present

## 2019-08-24 IMAGING — DX DG CHEST 2V
2 series · 2 of 2 positions shown · non-contrast
Comparison: None.

CLINICAL DATA: Intermittent shortness of breath for several months.
Current smoker.

EXAM:
CHEST - 2 VIEW

[chest pa]
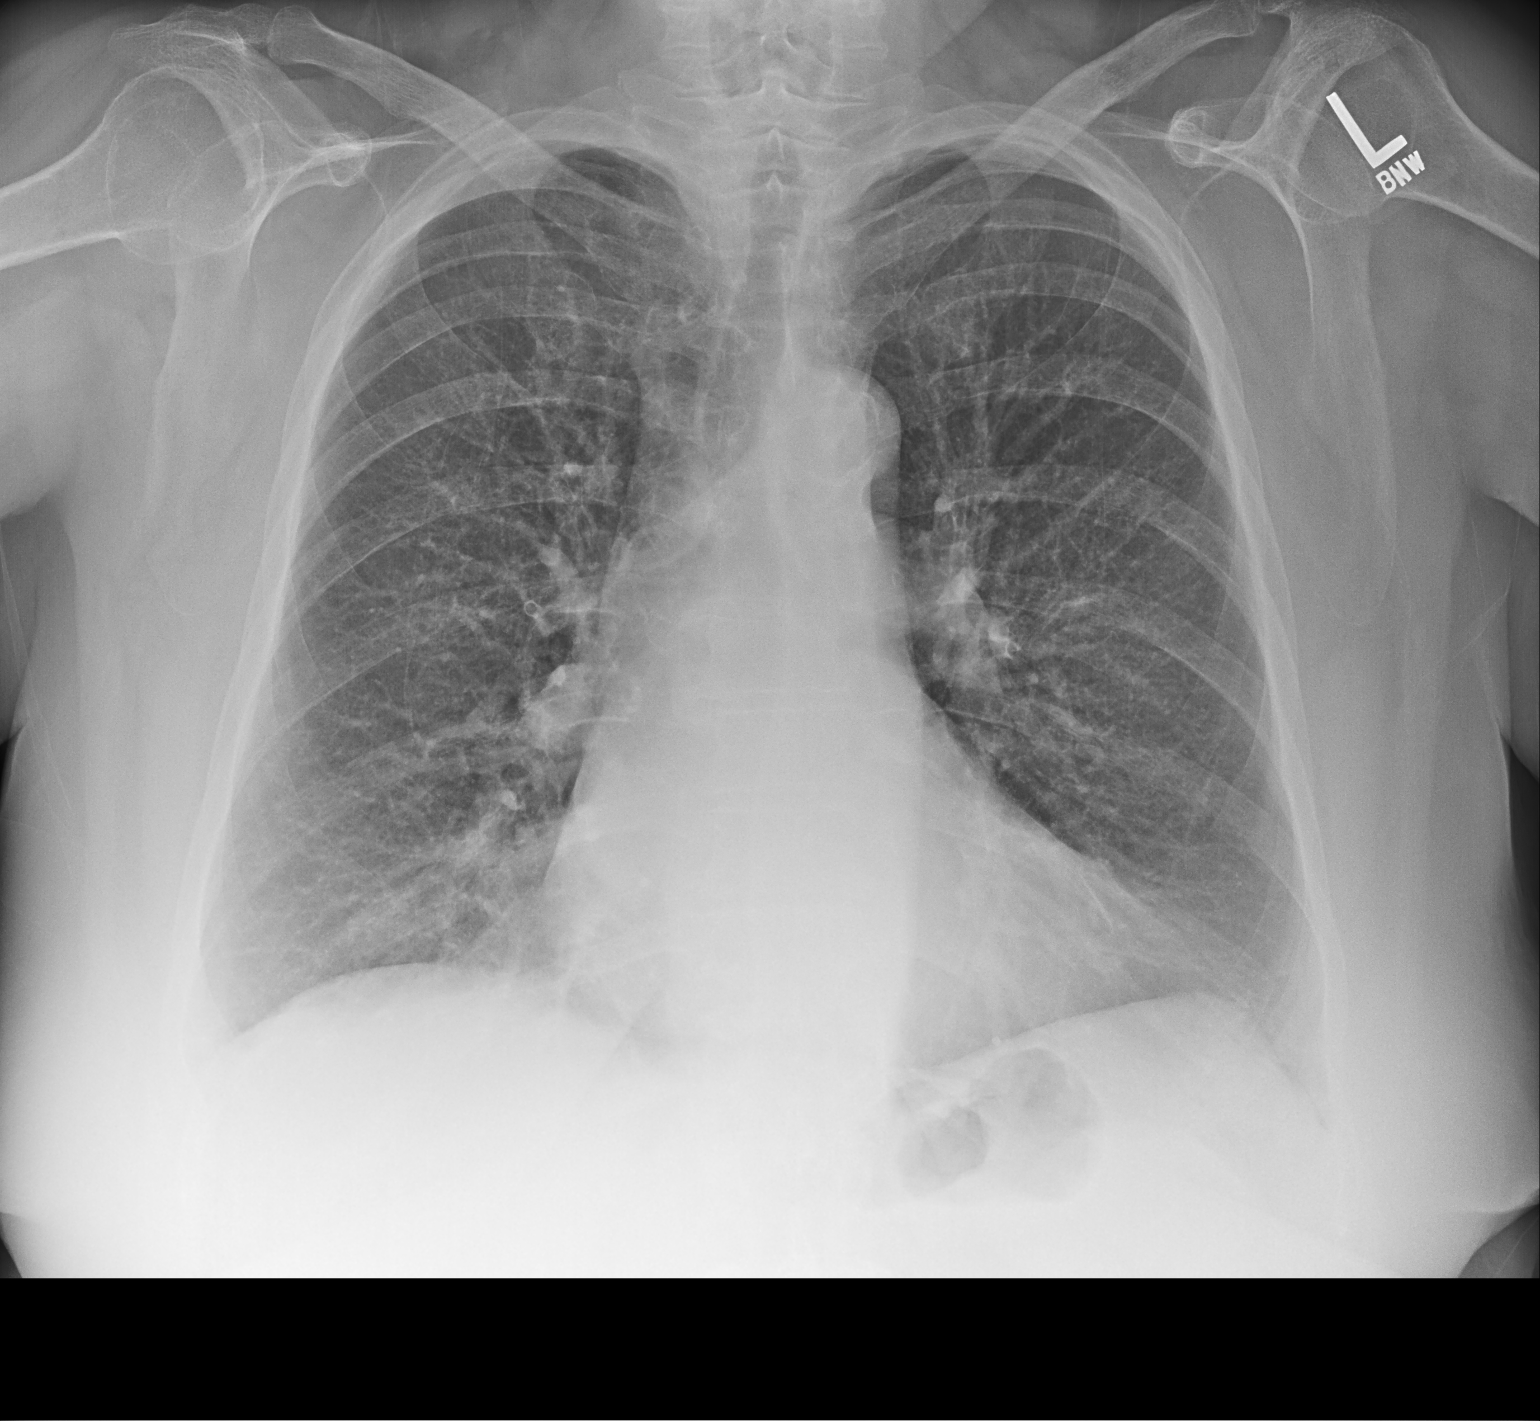

[chest lat]
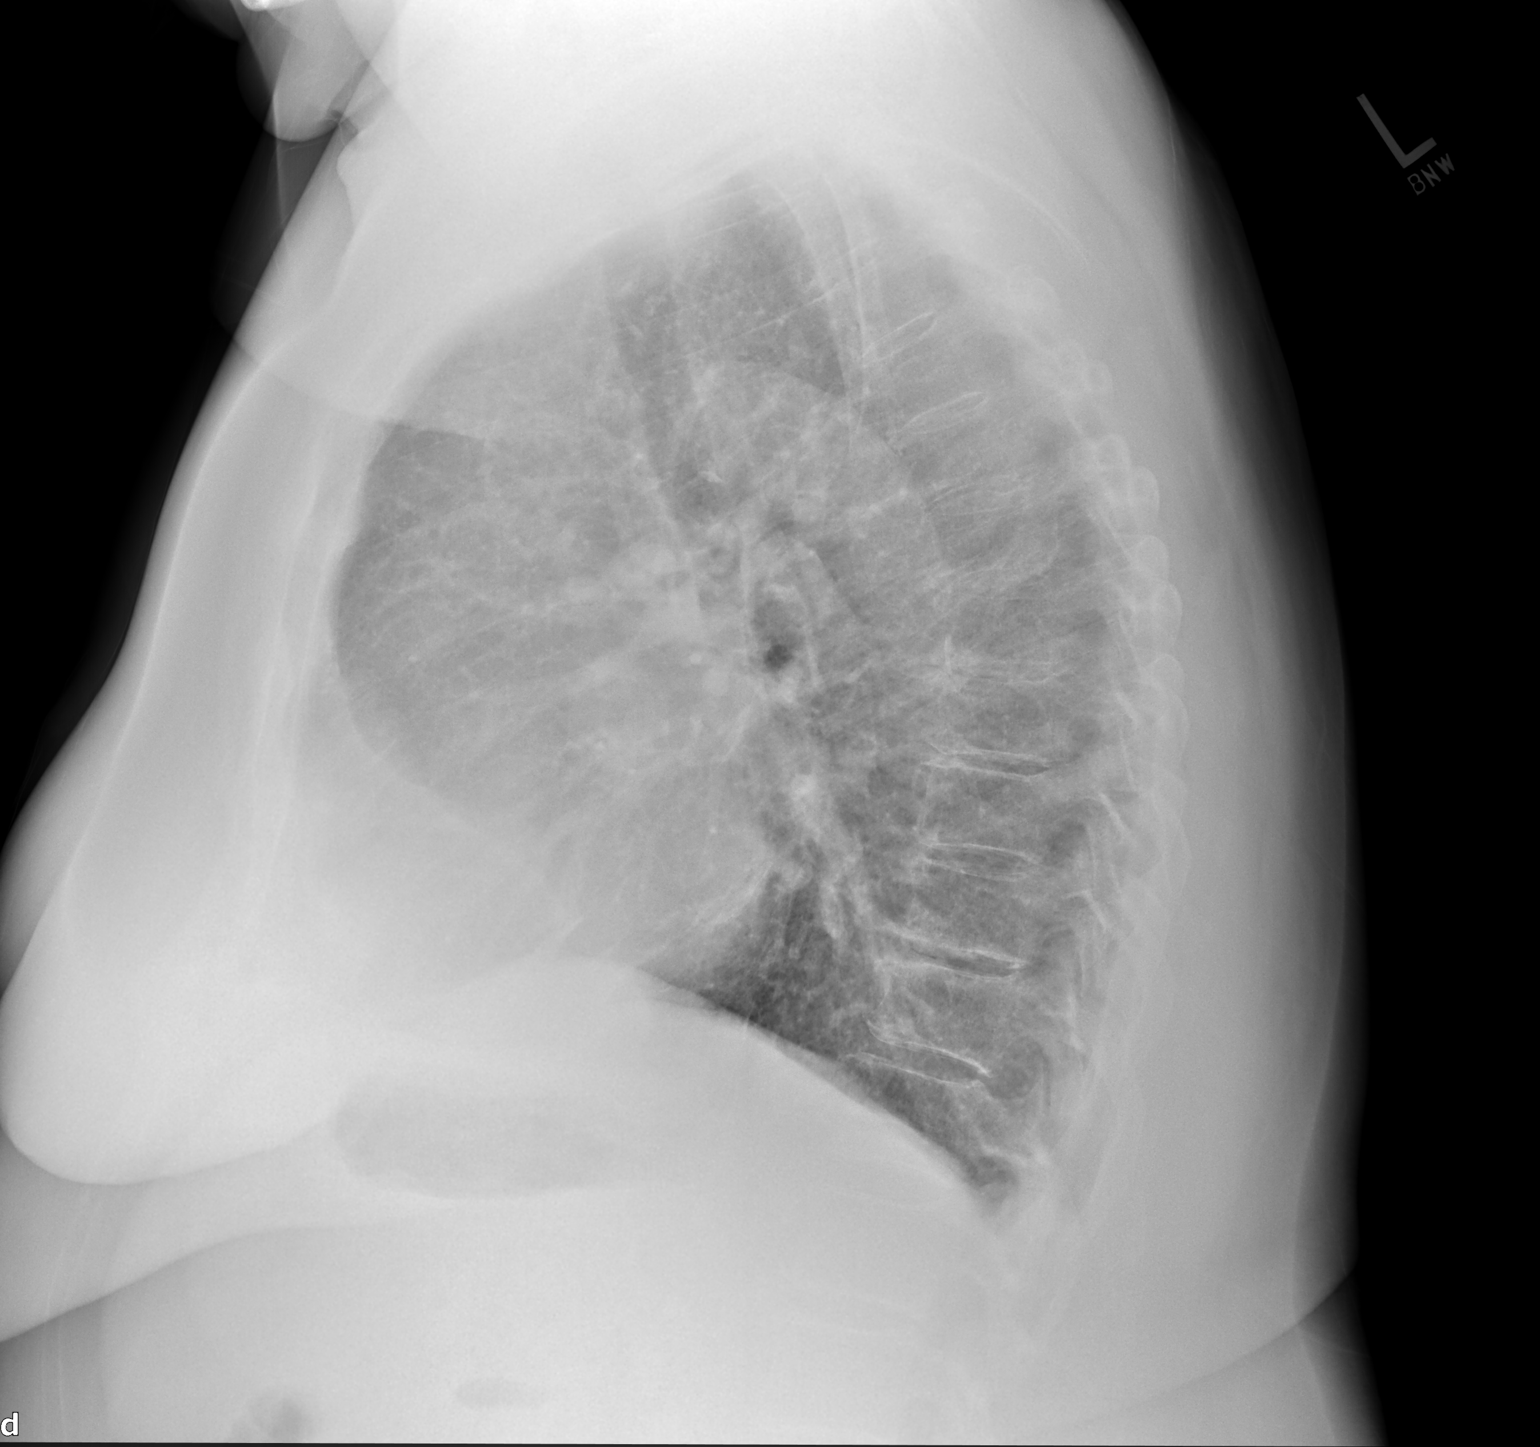

[2 of 2 positions shown; findings below may reference images not displayed]

FINDINGS: The lungs are adequately inflated. The interstitial markings are
coarse. There is no alveolar infiltrate or pleural effusion. The
heart is top-normal in size. The pulmonary vascularity is not
engorged. The mediastinum is normal in width. There is calcification
in the wall of the thoracic aorta. The bony thorax exhibits no acute
abnormality.
IMPRESSION: Chronic bronchitic-smoking related changes. No acute cardiopulmonary
abnormality.

Thoracic aortic atherosclerosis.

## 2019-08-24 IMAGING — DX DG LUMBAR SPINE 2-3V CLEARING
3 series · 3 of 3 positions shown · non-contrast
Comparison: No recent.

CLINICAL DATA: Shortness of breath.

EXAM:
LIMITED LUMBAR SPINE FOR TRAUMA CLEARING - 2-3 VIEW

[lumbar spine ap]
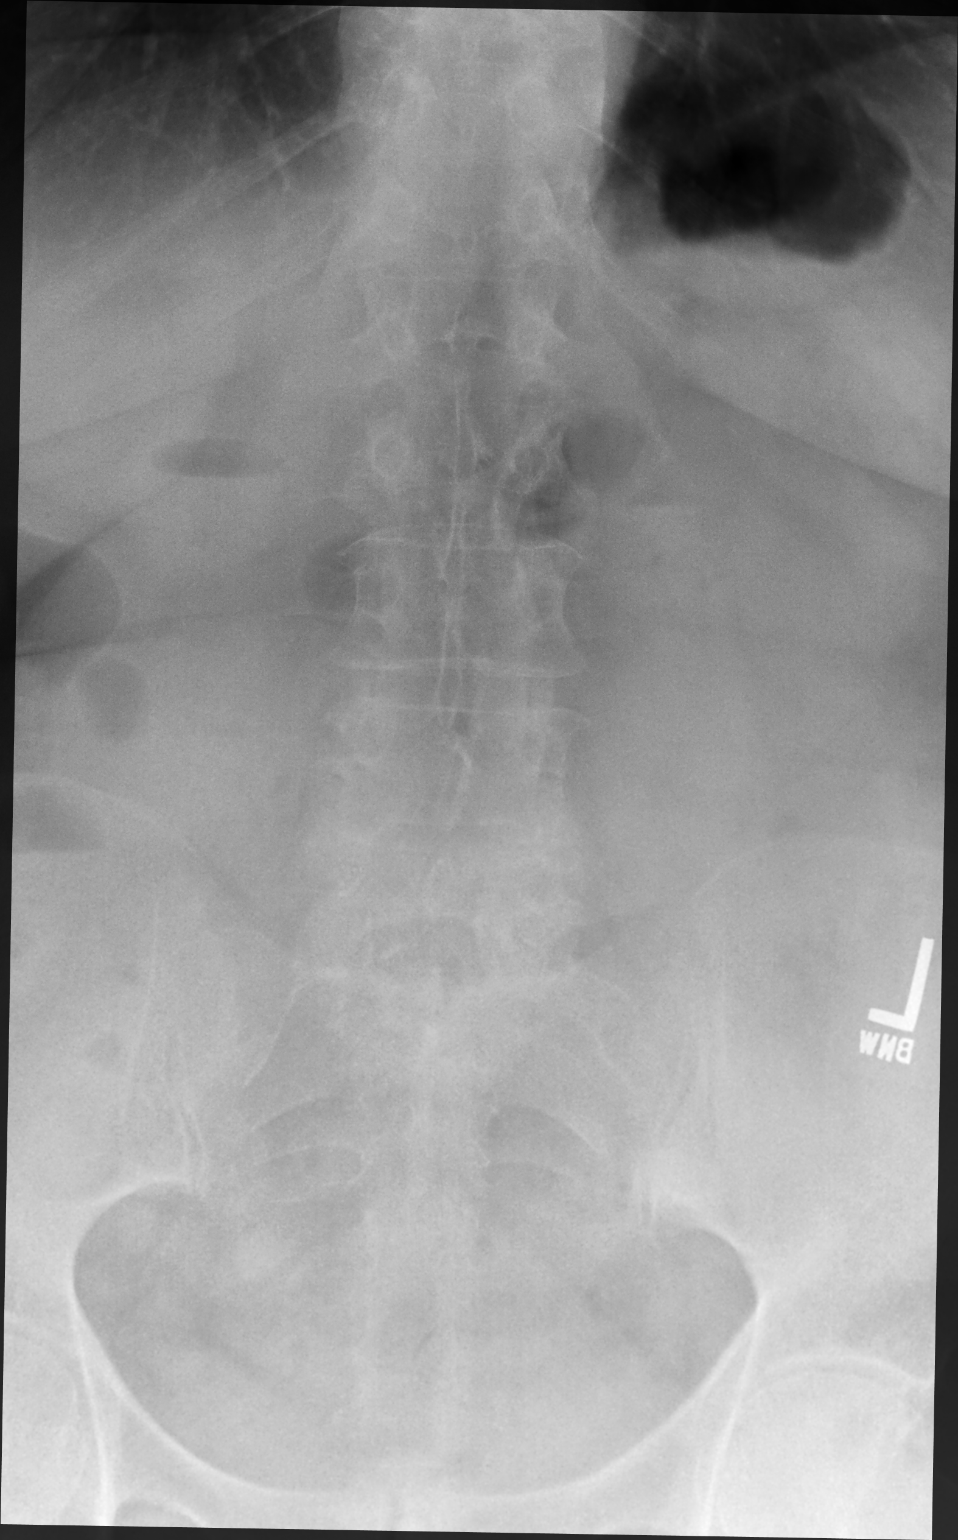

[lumbar spine lat]
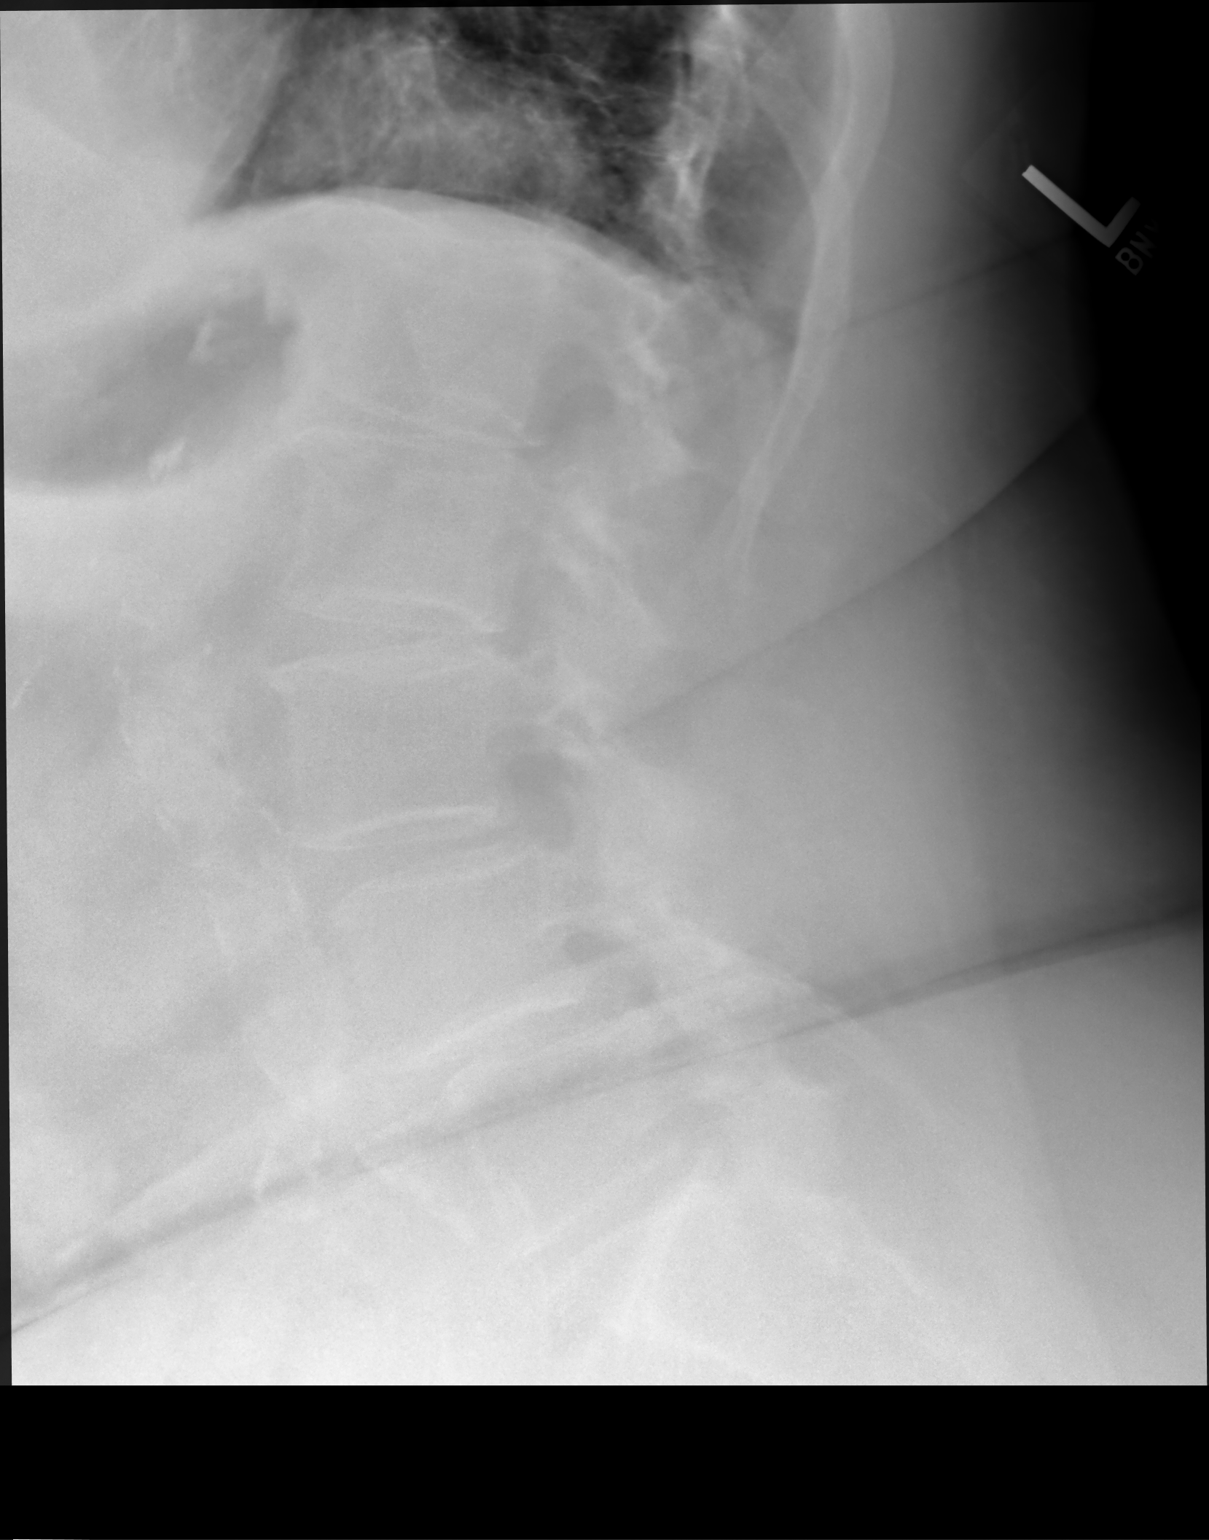

[lumbar spot lat]
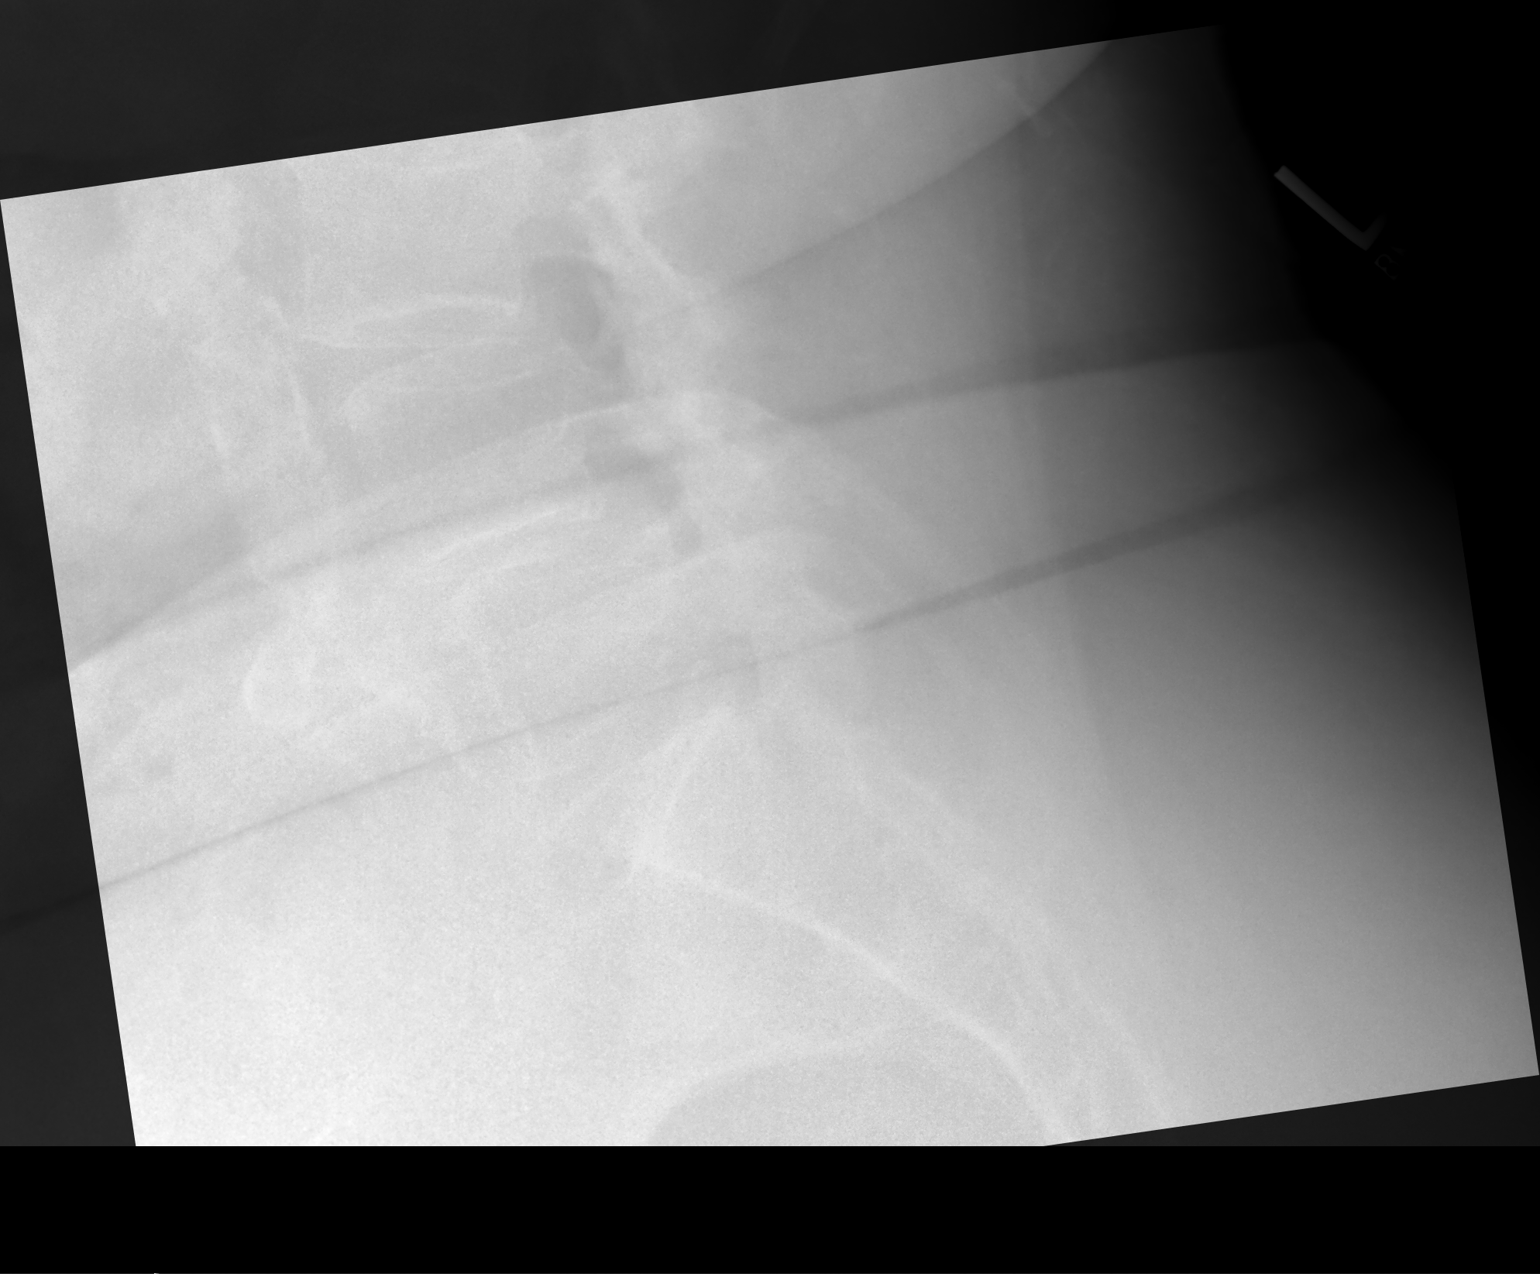

[3 of 3 positions shown; findings below may reference images not displayed]

FINDINGS: Lumbar spine numbered with the lowest segmented appearing lumbar
vertebrae on lateral view as L5. Lumbar spine scratched it diffuse
mild degenerative change scratched it diffuse multilevel
degenerative change with 9 mm anterolisthesis L4 on L5. No acute
bony abnormality. Aortoiliac atherosclerotic vascular calcification.
IMPRESSION: Number diffuse multilevel degenerative change with 9 mm
anterolisthesis of L4 on L5. No acute bony abnormality identified.

2.  Aortoiliac atherosclerotic vascular disease.

## 2019-08-28 ENCOUNTER — Telehealth: Payer: Self-pay | Admitting: *Deleted

## 2019-08-28 NOTE — Telephone Encounter (Signed)
Copied from [place] [id number]. Topic: General - Inquiry >> Aug 28, 2019  3:27 PM Scherrie Gerlach wrote: Reason for CRM:  pt wants to make sure her labs are at [place] because she is going in the morning. It looks like they were sent 6/10, I told her that, but she wants to confirm this was really done. Also advised someone will call her about her appt on Friday - virtual or in office

## 2019-08-29 DIAGNOSIS — R739 Hyperglycemia, unspecified: Secondary | ICD-10-CM | POA: Diagnosis not present

## 2019-08-29 DIAGNOSIS — E7800 Pure hypercholesterolemia, unspecified: Secondary | ICD-10-CM | POA: Diagnosis not present

## 2019-08-29 DIAGNOSIS — I10 Essential (primary) hypertension: Secondary | ICD-10-CM | POA: Diagnosis not present

## 2019-08-29 NOTE — Telephone Encounter (Signed)
Pt had labs done and appt will be virtual

## 2019-08-30 LAB — HEPATIC FUNCTION PANEL
ALT: 13 IU/L (ref 0–32)
AST: 16 IU/L (ref 0–40)
Albumin: 4.5 g/dL (ref 3.7–4.7)
Alkaline Phosphatase: 63 IU/L (ref 39–117)
Bilirubin Total: 0.8 mg/dL (ref 0.0–1.2)
Bilirubin, Direct: 0.22 mg/dL (ref 0.00–0.40)
Total Protein: 7 g/dL (ref 6.0–8.5)

## 2019-08-30 LAB — BASIC METABOLIC PANEL
BUN/Creatinine Ratio: 12 (ref 12–28)
BUN: 11 mg/dL (ref 8–27)
CO2: 24 mmol/L (ref 20–29)
Calcium: 9.8 mg/dL (ref 8.7–10.3)
Chloride: 101 mmol/L (ref 96–106)
Creatinine, Ser: 0.92 mg/dL (ref 0.57–1.00)
GFR calc Af Amer: 69 mL/min/{1.73_m2} (ref >59)
GFR calc non Af Amer: 60 mL/min/{1.73_m2} (ref >59)
Glucose: 104 mg/dL — ABNORMAL HIGH (ref 65–99)
Potassium: 3.8 mmol/L (ref 3.5–5.2)
Sodium: 143 mmol/L (ref 134–144)

## 2019-08-30 LAB — LIPID PANEL
Chol/HDL Ratio: 2.7 ratio (ref 0.0–4.4)
Cholesterol, Total: 166 mg/dL (ref 100–199)
HDL: 62 mg/dL (ref >39)
LDL Chol Calc (NIH): 88 mg/dL (ref 0–99)
Triglycerides: 85 mg/dL (ref 0–149)
VLDL Cholesterol Cal: 16 mg/dL (ref 5–40)

## 2019-08-30 LAB — HEMOGLOBIN A1C
Est. average glucose Bld gHb Est-mCnc: 120 mg/dL
Hgb A1c MFr Bld: 5.8 % — ABNORMAL HIGH (ref 4.8–5.6)

## 2019-08-31 ENCOUNTER — Encounter: Payer: Self-pay | Admitting: Internal Medicine

## 2019-08-31 ENCOUNTER — Other Ambulatory Visit: Payer: Self-pay

## 2019-08-31 ENCOUNTER — Ambulatory Visit (INDEPENDENT_AMBULATORY_CARE_PROVIDER_SITE_OTHER): Payer: PPO | Admitting: Internal Medicine

## 2019-08-31 DIAGNOSIS — I10 Essential (primary) hypertension: Secondary | ICD-10-CM | POA: Diagnosis not present

## 2019-08-31 DIAGNOSIS — M25551 Pain in right hip: Secondary | ICD-10-CM

## 2019-08-31 DIAGNOSIS — J449 Chronic obstructive pulmonary disease, unspecified: Secondary | ICD-10-CM | POA: Diagnosis not present

## 2019-08-31 DIAGNOSIS — F419 Anxiety disorder, unspecified: Secondary | ICD-10-CM

## 2019-08-31 DIAGNOSIS — I4821 Permanent atrial fibrillation: Secondary | ICD-10-CM | POA: Diagnosis not present

## 2019-08-31 DIAGNOSIS — R739 Hyperglycemia, unspecified: Secondary | ICD-10-CM

## 2019-08-31 DIAGNOSIS — Z720 Tobacco use: Secondary | ICD-10-CM | POA: Diagnosis not present

## 2019-08-31 DIAGNOSIS — E7800 Pure hypercholesterolemia, unspecified: Secondary | ICD-10-CM | POA: Diagnosis not present

## 2019-08-31 NOTE — Progress Notes (Signed)
Patient ID: Danielle Yates, female   DOB: 02-27-1941, 78 y.o.   MRN: [id number]   Virtual Visit via video Note  This visit type was conducted due to national recommendations for restrictions regarding the COVID-19 pandemic (e.g. social distancing).  This format is felt to be most appropriate for this patient at this time.  All issues noted in this document were discussed and addressed.  No physical exam was performed (except for noted visual exam findings with Video Visits).   I connected with Danielle Yates by a video enabled telemedicine application and verified that I am speaking with the correct person using two identifiers. Location patient: home Location provider: work Persons participating in the virtual visit: patient, provider  I discussed the limitations, risks, security and privacy concerns of performing an evaluation and management service by telephone and the availability of in person appointments. The patient expressed understanding and agreed to proceed.   Reason for visit: scheduled follow up.    HPI: She reports she is doing relatively well.  Blood pressure has been doing well.  Not exercising regularly.  Some right hip pain.  Feels related to not being as active.  No recent fall or injury.  Worse after sitting and when she goes to stand.  Discussed therapy.  She is in agreement.  No chest pain.  No sob.  No acid reflux.  No abdominal pain.  Bowels moving.  Seeing Dr Mortimer Fries.  Breathing stable. No increased cough or congestion.  Taking prozac.  Handling stress.  Scheduled for mammogram next week.  Discussed the need to quit smoking.    ROS: See pertinent positives and negatives per HPI.  Past Medical History:  Diagnosis Date  . Allergy   . Aortic valve sclerosis    a. TTE 2013: EF 60-65%, no RWMA, Gr1DD, trivial MR, mildly dilated LA 43 mm; b. TTE 08/2017: EF 65-70%, mild LVH, aortic valve sclerosis without stenosis, mildly dilated LA 36 mm, RV size and sys fxn nl  .  Arthropathy, unspecified, site unspecified   . Cataract   . Chronic rhinitis   . Disorder of bone and cartilage, unspecified   . Diverticulosis of colon (without mention of hemorrhage)   . Essential hypertension    a. refractory HTN; b. renal US 2013 negative for RAS  . External hemorrhoids without mention of complication   . Medication intolerance   . Obesity, unspecified   . Other abnormal glucose   . Panic disorder without agoraphobia   . Peripheral arterial disease ([place])    a.  Known bilateral common iliac disease-> medical therapy-asymptomatic.  [name] Persistent atrial fibrillation    a. diagnosed 10/19/17; b. CHADS2VASc 5 (HTN, age x 2, vascular disease, female)-->Eliquis  . Pure hypercholesterolemia   . Tobacco use disorder   . Unspecified vitamin D deficiency     Past Surgical History:  Procedure Laterality Date  . APPENDECTOMY  1985  . CHOLECYSTECTOMY  1985   with open wound  . EYE SURGERY  2011   CATARACTS -  BOTH  . Removed spot on face     [name]  . TONSILLECTOMY AND ADENOIDECTOMY  1946  . VAGINAL HYSTERECTOMY  1982   fibroid and endometriosis; one ovary remaining    Family History  Problem Relation Age of Onset  . Hypertension Sister   . Heart disease Brother   . Hypertension Brother   . Hyperlipidemia Brother   . Aneurysm Mother   . Rheumatic fever Mother  as an adult  . Heart disease Father        MI x 2    SOCIAL HX: reviewed.    Current Outpatient Medications:  .  albuterol (PROVENTIL HFA;VENTOLIN HFA) 108 (90 Base) MCG/ACT inhaler, Inhale 2 puffs into the lungs every 6 (six) hours as needed for wheezing or shortness of breath., Disp: 1 Inhaler, Rfl: 2 .  apixaban (ELIQUIS) 5 MG TABS tablet, Take 1 tablet (5 mg total) by mouth 2 (two) times daily., Disp: 180 tablet, Rfl: 2 .  diltiazem (TIAZAC) 360 MG 24 hr capsule, TAKE 1 CAPSULE(360 MG) BY MOUTH DAILY, Disp: 90 capsule, Rfl: 2 .  enalapril (VASOTEC) 20 MG tablet, TAKE 1 TABLET BY MOUTH DAILY  IN THE MORNING, AND 2 TABLETS DAILY IN THE AFTERNOON, Disp: 90 tablet, Rfl: 3 .  FLUoxetine (PROZAC) 20 MG capsule, TAKE 1 CAPSULE(20 MG) BY MOUTH DAILY, Disp: 90 capsule, Rfl: 1 .  fluticasone (FLONASE) 50 MCG/ACT nasal spray, INSTILL 2 SPRAYS IN EACH NOSTRIL DAILY, Disp: 48 g, Rfl: 2 .  pravastatin (PRAVACHOL) 10 MG tablet, Take 1 tablet (10 mg total) by mouth daily., Disp: 90 tablet, Rfl: 3 .  Probiotic Product (PROBIOTIC DAILY PO), Take 1 tablet by mouth., Disp: , Rfl:  .  Tiotropium Bromide Monohydrate (SPIRIVA RESPIMAT) 1.25 MCG/ACT AERS, Inhale 2 puffs into the lungs daily., Disp: 4 g, Rfl: 3 .  triamterene-hydrochlorothiazide (MAXZIDE-25) 37.5-25 MG tablet, Take 1 tablet by mouth daily., Disp: 90 tablet, Rfl: 1 .  VITAMIN D, CHOLECALCIFEROL, PO, Take by mouth daily., Disp: , Rfl:   EXAM:  VITALS per patient if applicable:  102/58, 52.7, 99%, 80  GENERAL: alert, oriented, appears well and in no acute distress  HEENT: atraumatic, conjunttiva clear, no obvious abnormalities on inspection of external nose and ears  NECK: normal movements of the head and neck  LUNGS: on inspection no signs of respiratory distress, breathing rate appears normal, no obvious gross SOB, gasping or wheezing  CV: no obvious cyanosis  PSYCH/NEURO: pleasant and cooperative, no obvious depression or anxiety, speech and thought processing grossly intact  ASSESSMENT AND PLAN:  Discussed the following assessment and plan:  Anxiety Stable on prozac.    Atrial fibrillation ([place]) Followed by cardiology. Stable.  On eliquis.    Chronic obstructive pulmonary disease ([facility]) Followed by pulmonary.  Breathing stable.    Essential hypertension, benign Blood pressure under good control.  Continue same medication regimen.  Follow pressures.  Follow metabolic panel.    Hyperglycemia Low carb diet and exercise.  Follow met b and a1c.   Pure hypercholesterolemia On pravastatin.  Tolerating.  Low cholesterol diet  and exercise.  Follow lipid panel and liver function tests.   Tobacco abuse Discussed the need to quit smoking.  Follow.   Right hip pain Persistent right hip pain as outlined.  Refer to PT for evaluation and treatment.      I discussed the assessment and treatment plan with the patient. The patient was provided an opportunity to ask questions and all were answered. The patient agreed with the plan and demonstrated an understanding of the instructions.   The patient was advised to call back or seek an in-person evaluation if the symptoms worsen or if the condition fails to improve as anticipated.   Einar Pheasant, MD

## 2019-09-01 ENCOUNTER — Ambulatory Visit (INDEPENDENT_AMBULATORY_CARE_PROVIDER_SITE_OTHER): Payer: PPO

## 2019-09-01 ENCOUNTER — Other Ambulatory Visit: Payer: Self-pay

## 2019-09-01 DIAGNOSIS — Z23 Encounter for immunization: Secondary | ICD-10-CM | POA: Diagnosis not present

## 2019-09-02 ENCOUNTER — Encounter: Payer: Self-pay | Admitting: Internal Medicine

## 2019-09-02 DIAGNOSIS — M25551 Pain in right hip: Secondary | ICD-10-CM | POA: Insufficient documentation

## 2019-09-02 NOTE — Assessment & Plan Note (Signed)
Discussed the need to quit smoking. Follow.  

## 2019-09-02 NOTE — Assessment & Plan Note (Signed)
Blood pressure under good control.  Continue same medication regimen.  Follow pressures.  Follow metabolic panel.   

## 2019-09-02 NOTE — Assessment & Plan Note (Signed)
Persistent right hip pain as outlined.  Refer to PT for evaluation and treatment.

## 2019-09-02 NOTE — Assessment & Plan Note (Signed)
Followed by cardiology.  Stable. On eliquis.  

## 2019-09-02 NOTE — Assessment & Plan Note (Signed)
On pravastatin.  Tolerating.  Low cholesterol diet and exercise.  Follow lipid panel and liver function tests.   

## 2019-09-02 NOTE — Assessment & Plan Note (Signed)
Stable on prozac.  

## 2019-09-02 NOTE — Assessment & Plan Note (Signed)
Followed by pulmonary.  Breathing stable.   

## 2019-09-02 NOTE — Assessment & Plan Note (Signed)
Low carb diet and exercise.  Follow met b and a1c.  

## 2019-09-03 ENCOUNTER — Encounter: Payer: Self-pay | Admitting: Internal Medicine

## 2019-09-03 DIAGNOSIS — Z1231 Encounter for screening mammogram for malignant neoplasm of breast: Secondary | ICD-10-CM | POA: Diagnosis not present

## 2019-09-03 DIAGNOSIS — I4821 Permanent atrial fibrillation: Secondary | ICD-10-CM

## 2019-09-03 DIAGNOSIS — R739 Hyperglycemia, unspecified: Secondary | ICD-10-CM

## 2019-09-03 DIAGNOSIS — I10 Essential (primary) hypertension: Secondary | ICD-10-CM

## 2019-09-03 DIAGNOSIS — E7800 Pure hypercholesterolemia, unspecified: Secondary | ICD-10-CM

## 2019-09-03 LAB — HM MAMMOGRAPHY

## 2019-09-05 NOTE — Telephone Encounter (Signed)
Orders placed for [facility] labs.

## 2019-09-06 NOTE — Telephone Encounter (Signed)
Please clarify and abstract - shingles vaccine.  See my chart message

## 2019-09-06 NOTE — Telephone Encounter (Signed)
done

## 2019-09-07 ENCOUNTER — Ambulatory Visit: Payer: PPO | Admitting: Internal Medicine

## 2019-09-12 ENCOUNTER — Ambulatory Visit: Payer: PPO | Admitting: Physical Therapy

## 2019-09-17 ENCOUNTER — Encounter: Payer: Self-pay | Admitting: Physical Therapy

## 2019-09-17 ENCOUNTER — Other Ambulatory Visit: Payer: Self-pay

## 2019-09-17 ENCOUNTER — Encounter: Payer: PPO | Admitting: Physical Therapy

## 2019-09-17 ENCOUNTER — Ambulatory Visit: Payer: PPO | Attending: Internal Medicine | Admitting: Physical Therapy

## 2019-09-17 DIAGNOSIS — M6281 Muscle weakness (generalized): Secondary | ICD-10-CM | POA: Insufficient documentation

## 2019-09-17 DIAGNOSIS — M25551 Pain in right hip: Secondary | ICD-10-CM | POA: Diagnosis not present

## 2019-09-17 DIAGNOSIS — M5441 Lumbago with sciatica, right side: Secondary | ICD-10-CM | POA: Insufficient documentation

## 2019-09-17 DIAGNOSIS — G8929 Other chronic pain: Secondary | ICD-10-CM | POA: Diagnosis not present

## 2019-09-17 NOTE — Therapy (Addendum)
[facility] [address] [place], [address] Phone: [phone]   Fax:  [phone]  Physical Therapy Evaluation  Patient Details  Name: Danielle Yates MRN: [id number] Date of Birth: 1941-10-24 Referring Provider (PT):  Einar Pheasant, MD   Encounter Date: 09/17/2019  PT End of Session - 09/17/19 0948    Visit Number  1    Number of Visits  12    Date for PT Re-Evaluation  10/29/19    Authorization Type  [employer] reporting from 09/17/2019    Authorization Time Period  certification period: 09/17/2019- 10/29/2019    Authorization - Visit Number  1    Authorization - Number of Visits  10    PT Start Time  0946    PT Stop Time  1049    PT Time Calculation (min)  63 min    Equipment Utilized During Treatment  Gait belt    Activity Tolerance  Patient tolerated treatment well    Behavior During Therapy  [facility] for tasks assessed/performed       Past Medical History:  Diagnosis Date  . Allergy   . Aortic valve sclerosis    a. TTE 2013: EF 60-65%, no RWMA, Gr1DD, trivial MR, mildly dilated LA 43 mm; b. TTE 08/2017: EF 65-70%, mild LVH, aortic valve sclerosis without stenosis, mildly dilated LA 36 mm, RV size and sys fxn nl  . Arthropathy, unspecified, site unspecified   . Cataract   . Chronic rhinitis   . Disorder of bone and cartilage, unspecified   . Diverticulosis of colon (without mention of hemorrhage)   . Essential hypertension    a. refractory HTN; b. renal US 2013 negative for RAS  . External hemorrhoids without mention of complication   . Medication intolerance   . Obesity, unspecified   . Other abnormal glucose   . Panic disorder without agoraphobia   . Peripheral arterial disease ([place])    a.  Known bilateral common iliac disease-> medical therapy-asymptomatic.  [name] Persistent atrial fibrillation ([place])    a. diagnosed 10/19/17; b. CHADS2VASc 5 (HTN, age x 2, vascular disease,  female)-->Eliquis  . Pure hypercholesterolemia   . Tobacco use disorder   . Unspecified vitamin D deficiency     Past Surgical History:  Procedure Laterality Date  . APPENDECTOMY  1985  . CHOLECYSTECTOMY  1985   with open wound  . EYE SURGERY  2011   CATARACTS -  BOTH  . Removed spot on face     [name]  . TONSILLECTOMY AND ADENOIDECTOMY  1946  . VAGINAL HYSTERECTOMY  1982   fibroid and endometriosis; one ovary remaining   . There were no vitals filed for this visit.     Subjective Assessment - 09/17/19 0951    Subjective  Patient reports the onset of pain many years ago located at low back and right hip. Patient had a fall 20 years ago and that her first onset of low back pain. Patient states the recent onset of the pain is taking care of her neighbor because her neighbor had a fall last year before the [place]. The most recent onset of the pain is due to quarantine and she hasn't move much at home. The pain is currently 4/10, and the worse pain can go up to 6/10; lowest pain is 0/10. Patient is currently retired and staying at house. Patient enjoys walking and playing with her dog.    Pertinent History  Patient is  a 78 y.o. female who presents to outpatient physical therapy with a referral for medical diagnosis of R hip pain. This patient's chief complaints consist of constant R hip pain and lower back pain that has been gradually increasing in severity since March 2020, leading to the following functional deficits: difficulty with house working activities, prolonged sitting/standing/walking, quality of life, lifting, etc.. 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  1 hour    How long can you stand comfortably?  20-30 mines    How long can you walk comfortably?  15 min    Diagnostic tests  X-ray Lumbar: Number diffuse multilevel degenerative change with 9 mmanterolisthesis of L4 on L5. No acute bony abnormality identified.  Aortoiliac atherosclerotic vascular disease. 2019    Patient Stated Goals  Loss more weight; start a exercise routine; more active; Reduce the R hip pain    Currently in Pain?  Yes    Pain Score  4     Pain Location  Hip    Pain Orientation  Right    Pain Descriptors / Indicators  Aching;Discomfort    Pain Type  Chronic pain    Pain Onset  More than a month ago    Pain Frequency  Intermittent    Aggravating Factors   prolonged walking/sitting/standing; getting up and down stairs, sit <>standing; sleeping; lifting 20 bottled water    Pain Relieving Factors  pain medication    Effect of Pain on Daily Activities  prolonged walking/sitting/standing; getting up and down stairs, sit <>standing; sleeping; lifting 20 bottled water         OBJECTIVE  Patient Specific Functional Scale: (0 unable to perform; 10 no difficult) . Stairs: 8 . Picking up the water: 7 . Sleep on the R side: 8    MUSCULOSKELETAL: Tremor: None  Posture Excessive lumbar lordosis, kyphosis, and shoulder protraction in sitting/walking/standing.  Gait Bilateral knee valgus and narrow base of support   Palpation No pain at R gluteal region but minor tenderness at R L4-5 lower back.   Strength (out of 5) R/L 3+/3+ Hip flexion; R gluteal pain when MMT on L hip flexor* 3+/3+ Hip abduction with strong quadriceps activation.   4+/4+ Hip adduction. Modified in sitting due to patient uncomfortable lying on treatment table.  3+/3+ Hip extension 5/5 Knee extension 5/5 Knee flexion 5/5 Ankle dorsiflexion/inversion/eversion *Indicates pain   AROM (degrees) Deferred to later session.    Muscle Length Ober: WFL    Functional  test:  FGA: 24 moderate risk for fall (stairs 1/3; walking with NBS; and speed change) 5 x STS:  First trial: 12.86 s with lack of knee extension.  Second Trial: 12.92s with hand over anterior hips with minor support during sit to stand.   Objective measurements completed on examination: See above findings.    TREATMENT:  Therapeutic exercise: to centralize symptoms and improve ROM, strength, muscular endurance, and activity tolerance required for successful completion of functional activities.  - Standing hip extension with BUEs support at stationary bar at treadmill 2x10 reps on each side.  - Standing TA and gluteal co-activation 2x10 reps.  - Ambulating 4 steps with step to gait pattern with BUEs holding on railings.  - Cuing provide for forms  and techniques .  -Patient was educated on diagnosis, anatomy and pathology involved, prognosis, role of PT, and was given an HEP, demonstrating exercise with proper form following verbal and tactile cues, and was given a paper hand out to continue exercise at home. Pt was educated on and agreed to plan of care.   Access Code: [email or web address]  URL: [email or web address]  Date: 09/17/2019  Prepared by: Rosita Kea   Exercises Standing Hip Extension with Counter Support - 3 sets - 10 reps - 3s hold - 1x daily - 7x weekly Standing Gluteal Sets - 3 sets - 10 reps - 3s hold - 1x daily - 7x weekly    PT Education - 09/17/19 0948    Education Details  Education: Exercise purpose/form. Self management techniques. Education on diagnosis, prognosis, POC, anatomy and physiology of current condition Education on HEP including handout    Person(s) Educated  Patient    Methods  Demonstration;Tactile cues;Verbal cues;Handout;Explanation    Comprehension  Verbalized understanding;Returned demonstration;Verbal cues required;Tactile cues required       PT Short Term Goals - 09/17/19 0950      PT SHORT TERM GOAL #1   Title  Be independent with  initial home exercise program for self-management of symptoms.    Baseline  initial HEP provided at IE (09/17/2019);    Time  2    Period  Weeks    Status  New    Target Date  10/01/19        PT Long Term Goals - 09/17/19 0951      PT LONG TERM GOAL #1   Title  Be independent with a long-term home exercise program for self-management of symptoms.    Baseline  initial HEP provided at IE (09/17/2019);    Time  6    Period  Weeks    Status  New    Target Date  10/29/19      PT LONG TERM GOAL #2   Title  Demonstrate improved FOTO score by 10 units to demonstrate improvement in overall condition and self-reported functional ability.    Baseline  FOTO = 50(09/17/2019)    Time  6    Period  Weeks    Status  New    Target Date  10/29/19      PT LONG TERM GOAL #3   Title  Reduce pain with functional activities to equal or less than 1/10 to allow patient to complete usual activities including ADLs, IADLs, and social engagement with less difficulty.    Baseline  4/10 (09/17/2019)    Time  6    Period  Weeks    Status  New    Target Date  10/29/19      PT LONG TERM GOAL #4   Title  Complete community, work and/or recreational activities without limitation due to current condition.    Baseline  prolonged walking/sitting/standing; getting up and down stairs, sit <>standing; sleeping; lifting 20 bottled water(09/17/2019)    Time  6    Period  Weeks    Status  New    Target Date  10/29/19      PT LONG TERM GOAL #5   Title  Pt will increase strength of by at least 1/2 MMT grade in order to demonstrate improvement in strength and function.    Baseline  See objective notes(09/17/2019);    Time  6    Period  Weeks    Status  New    Target Date  10/29/19  Plan - 09/17/19 1216    Clinical Impression Statement  Patient is a 78 y.o. female who presents to outpatient physical therapy with a referral for medical diagnosis of R hip pain. This patient presents with the  sign and symptoms consistent with R hip pain, low back pain, stiffness, muscle spasm with functional activities/recreational activities. Upon assessment, pt demonstrated deficits such as tenderness to palpation of L4-5, as well as deficits in strength (and pain with strength assessment), mobility, weakness, significant pain with mobility. These deficits limit the patient ability to perform things such as ADLs, IADLs, social participation, caring for others, engaging in hobbies (play and walking her dog), and impairs their quality of life. The pt will benefit from skilled PT services to address deficits and return to PLOF and independence, recreational activity and work.    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 etc.; surgeries include appendectomy, cholecystectomy, vaginal hysterectomy    Examination-Activity Limitations  Bend;Caring for Others;Carry;Sleep;Lift;Sit;Stand;Bed Mobility;Stairs;Squat    Examination-Participation Restrictions  Church;Yard Work;Community Activity    Stability/Clinical Decision Making  Stable/Uncomplicated    Clinical Decision Making  Low    Rehab Potential  Good    PT Frequency  2x / week    PT Duration  6 weeks    PT Treatment/Interventions  ADLs/Self Care Home Management;Cryotherapy;Moist Heat;Stair training;Therapeutic activities;Therapeutic exercise;Gait training;Functional mobility training;Patient/family education;Manual techniques;Energy conservation;Joint Manipulations;Other (comment)   joint mobilization grades I-V   PT Next Visit Plan  Strengthening; 10min walk test; HEP update    PT Home Exercise Plan  Medbridge: [id number]    Consulted and Agree with Plan of Care  Patient       Patient will benefit from skilled therapeutic intervention in order to improve the following deficits and impairments:  Abnormal gait, Difficulty  walking, Increased muscle spasms, Impaired sensation, Impaired tone, Decreased activity tolerance, Decreased strength, Pain  Visit Diagnosis: Pain in right hip  Muscle weakness (generalized)  Chronic right-sided low back pain with right-sided sciatica     Problem List Patient Active Problem List   Diagnosis Date Noted  . Right hip pain 09/02/2019  . Toe pain, right 04/29/2019  . Chronic obstructive pulmonary disease ([place]) 01/01/2019  . Atrial fibrillation ([place]) 10/20/2017  . Acute bronchitis 09/10/2015  . Abdominal bruit 02/02/2015  . Health care maintenance 02/02/2015  . BMI 40.0-44.9, adult ([place]) 10/06/2014  . URI (upper respiratory infection) 10/18/2013  . Left shoulder pain 10/02/2013  . Dyspnea 10/20/2012  . Abnormal EKG 10/16/2012  . Tobacco abuse 10/16/2012  . Family history of coronary arteriosclerosis 10/16/2012  . Pure hypercholesterolemia 10/02/2012  . Essential hypertension, benign 10/02/2012  . Hyperglycemia 10/02/2012  . Anxiety 10/02/2012    Sherrilyn Rist, SPT 09/17/19, 1:10 PM  Everlean Alstrom. Graylon Good, PT, DPT 09/24/19, 9:54 AM  Addendum completed by Everlean Alstrom. Graylon Good, PT, DPT to correct subjective information that did not populate from flow sheets on first day.   Everlean Alstrom. Graylon Good, PT, DPT 09/24/19, 9:54 AM   [facility] [address], [place], [address] Phone: [phone]   Fax:  [phone]  Name: ALEXIZ FRERKING MRN: [id number] Date of Birth: 1941/02/27

## 2019-09-19 ENCOUNTER — Other Ambulatory Visit: Payer: Self-pay

## 2019-09-19 ENCOUNTER — Ambulatory Visit: Payer: PPO | Admitting: Physical Therapy

## 2019-09-19 ENCOUNTER — Encounter: Payer: Self-pay | Admitting: Physical Therapy

## 2019-09-19 DIAGNOSIS — G8929 Other chronic pain: Secondary | ICD-10-CM

## 2019-09-19 DIAGNOSIS — M25551 Pain in right hip: Secondary | ICD-10-CM | POA: Diagnosis not present

## 2019-09-19 DIAGNOSIS — M5441 Lumbago with sciatica, right side: Secondary | ICD-10-CM

## 2019-09-19 DIAGNOSIS — M6281 Muscle weakness (generalized): Secondary | ICD-10-CM

## 2019-09-19 NOTE — Therapy (Signed)
[facility] [address] [place], [address] Phone: [phone]   Fax:  [phone]  Physical Therapy Treatment  Patient Details  Name: Danielle Yates MRN: [id number] Date of Birth: [birth year] Referring Provider (PT):  Einar Pheasant, MD   Encounter Date: 09/19/2019  PT End of Session - 09/19/19 2031    Visit Number  2    Number of Visits  12    Date for PT Re-Evaluation  10/29/19    Authorization Type  [employer] reporting from 09/17/2019    Authorization Time Period  certification period: 09/17/2019- 10/29/2019    Authorization - Visit Number  2    Authorization - Number of Visits  10    PT Start Time  0948    PT Stop Time  1030    PT Time Calculation (min)  42 min    Equipment Utilized During Treatment  Gait belt    Activity Tolerance  Patient tolerated treatment well    Behavior During Therapy  [facility] for tasks assessed/performed       Past Medical History:  Diagnosis Date  . Allergy   . Aortic valve sclerosis    a. TTE 2013: EF 60-65%, no RWMA, Gr1DD, trivial MR, mildly dilated LA 43 mm; b. TTE 08/2017: EF 65-70%, mild LVH, aortic valve sclerosis without stenosis, mildly dilated LA 36 mm, RV size and sys fxn nl  . Arthropathy, unspecified, site unspecified   . Cataract   . Chronic rhinitis   . Disorder of bone and cartilage, unspecified   . Diverticulosis of colon (without mention of hemorrhage)   . Essential hypertension    a. refractory HTN; b. renal US 2013 negative for RAS  . External hemorrhoids without mention of complication   . Medication intolerance   . Obesity, unspecified   . Other abnormal glucose   . Panic disorder without agoraphobia   . Peripheral arterial disease ([place])    a.  Known bilateral common iliac disease-> medical therapy-asymptomatic.  [name] Persistent atrial fibrillation ([place])    a. diagnosed 10/19/17; b. CHADS2VASc 5 (HTN, age x 2, vascular disease,  female)-->Eliquis  . Pure hypercholesterolemia   . Tobacco use disorder   . Unspecified vitamin D deficiency     Past Surgical History:  Procedure Laterality Date  . APPENDECTOMY  1985  . CHOLECYSTECTOMY  1985   with open wound  . EYE SURGERY  2011   CATARACTS -  BOTH  . Removed spot on face     [name]  . TONSILLECTOMY AND ADENOIDECTOMY  1946  . VAGINAL HYSTERECTOMY  1982   fibroid and endometriosis; one ovary remaining    There were no vitals filed for this visit.  Subjective Assessment - 09/19/19 2026    Subjective  Patient reports 3/10 pain today and felt moderate soreness after previous session but she recovered well from that.    Pertinent History  Patient is a 78 y.o. female who presents to outpatient physical therapy with a referral for medical diagnosis of R hip pain. This patient's chief complaints consist of constant R hip pain and lower back pain that has been gradually increasing in severity since March 2020, leading to the following functional deficits: difficulty with house working activities, prolonged sitting/standing/walking, quality of life, lifting, etc.. 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  1 hour    How long can you stand comfortably?  20-30 mines    How long can you walk comfortably?  15 min    Diagnostic tests  X-ray Lumbar: Number diffuse multilevel degenerative change with 9 mmanterolisthesis of L4 on L5. No acute bony abnormality identified.  Aortoiliac atherosclerotic vascular disease. 2019    Patient Stated Goals  Loss more weight; start a exercise routine; more active; Reduce the R hip pain     Currently in Pain?  Yes    Pain Score  4     Pain Location  Hip    Pain Orientation  Right    Pain Onset  More than a month ago       TREATMENT:  Therapeutic exercise: to centralize symptoms and improve ROM, strength, muscular endurance, and activity tolerance required for successful completion of functional activities.  - NuStep level 2 using bilateral  lower extremities. Setting 7. For improved extremity mobility, muscular endurance, and activity tolerance; and to induce the analgesic effect of aerobic exercise, stimulate improved joint nutrition, and prepare body structures and systems for following interventions. x 7 minutes  - Seated 100mins marching  - Standing hip extension 3x10 each side.  - Standing hip abduction 3x10 each side  - Sit <>stand from chair 3x10.   - Sit <>stand from chair 1x10 with red thereband.  -Education on HEP including handout   HOME EXERCISE PROGRAM Access Code: [email or web address]  URL: [email or web address]  Date: 09/19/2019  Prepared by: Rosita Kea   Exercises Standing Hip Extension with Counter Support - 3 sets - 10 reps - 3s hold - 1x daily - 7x weekly Standing Gluteal Sets - 3 sets - 10 reps - 3s hold - 1x daily - 7x weekly Sit to Stand with Resistance Around Legs - 3 sets - 10 reps - 1x daily - 7x weekly   Patient tolerated the session well and did not complain aggravating pain at end session. Patient demonstrated good muscle activation at gluteal region but still requires multiple cuing to improve proper form and techniques. Patient demonstrated good understanding of her current condition and will continue benefit from skilled PT services to address deficits and return to PLOF and independence, recreational activity and work.    PT Education - 09/19/19 2031    Education Details  Exercise purpose/form. Self management techniques.    Person(s) Educated  Patient    Methods  Explanation;Demonstration;Tactile cues;Verbal cues    Comprehension   Verbalized understanding;Returned demonstration;Verbal cues required;Tactile cues required       PT Short Term Goals - 09/17/19 0950      PT SHORT TERM GOAL #1   Title  Be independent with initial home exercise program for self-management of symptoms.    Baseline  initial HEP provided at IE (09/17/2019);    Time  2    Period  Weeks    Status  New    Target Date  10/01/19        PT Long Term Goals - 09/17/19 0951      PT LONG TERM GOAL #1   Title  Be independent with a long-term home exercise program for self-management of symptoms.    Baseline  initial HEP provided at IE (09/17/2019);    Time  6    Period  Weeks    Status  New    Target Date  10/29/19      PT LONG TERM GOAL #2   Title  Demonstrate improved FOTO score by 10 units to demonstrate improvement in overall condition and self-reported functional ability.    Baseline  FOTO = 50(09/17/2019)    Time  6    Period  Weeks    Status  New    Target Date  10/29/19      PT LONG TERM GOAL #3   Title  Reduce pain with functional activities to equal or less than 1/10 to allow patient to complete usual activities including ADLs, IADLs, and social engagement with less difficulty.    Baseline  4/10 (09/17/2019)    Time  6    Period  Weeks    Status  New    Target Date  10/29/19      PT LONG TERM GOAL #4   Title  Complete community, work and/or recreational activities without limitation due to current condition.    Baseline  prolonged walking/sitting/standing; getting up and down stairs, sit <>standing; sleeping; lifting 20 bottled water(09/17/2019)    Time  6    Period  Weeks    Status  New    Target Date  10/29/19      PT LONG TERM GOAL #5   Title  Pt will increase strength of by at least 1/2 MMT grade in order to demonstrate improvement in strength and function.    Baseline  See objective notes(09/17/2019);    Time  6    Period  Weeks    Status  New    Target Date  10/29/19            Plan - 09/19/19 2043     Clinical Impression Statement  Patient tolerated the session well and did not complain aggravating pain at end session. Patient demonstrated good muscle activation at gluteal region but still requires multiple cuing to improve proper form and techniques. 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 etc.; surgeries include appendectomy, cholecystectomy, vaginal hysterectomy    Examination-Activity Limitations  Bend;Caring for Others;Carry;Sleep;Lift;Sit;Stand;Bed Mobility;Stairs;Squat    Examination-Participation Restrictions  Church;Yard Work;Community Activity    Stability/Clinical Decision Making  Stable/Uncomplicated    Rehab Potential  Good    PT Frequency  2x / week    PT Duration  6 weeks    PT Treatment/Interventions  ADLs/Self Care Home Management;Cryotherapy;Moist Heat;Stair training;Therapeutic activities;Therapeutic exercise;Gait training;Functional mobility training;Patient/family education;Manual techniques;Energy conservation;Joint Manipulations;Other (comment)   joint mobilization grades I-V   PT Next Visit Plan  Strengthening; 67min walk test; HEP update    PT Home Exercise Plan  Medbridge: [id number]    Consulted and Agree with Plan of Care  Patient       Patient will benefit from skilled therapeutic intervention in order to improve the following deficits and impairments:  Abnormal gait, Difficulty walking, Increased muscle spasms, Impaired sensation, Impaired tone, Decreased activity tolerance, Decreased strength, Pain  Visit Diagnosis: Pain in right hip  Muscle weakness (generalized)  Chronic right-sided low back pain with right-sided  sciatica     Problem List Patient Active Problem List   Diagnosis Date Noted  . Right hip pain 09/02/2019  . Toe pain, right 04/29/2019  . Chronic  obstructive pulmonary disease ([place]) 01/01/2019  . Atrial fibrillation ([place]) 10/20/2017  . Acute bronchitis 09/10/2015  . Abdominal bruit 02/02/2015  . Health care maintenance 02/02/2015  . BMI 40.0-44.9, adult ([place]) 10/06/2014  . URI (upper respiratory infection) 10/18/2013  . Left shoulder pain 10/02/2013  . Dyspnea 10/20/2012  . Abnormal EKG 10/16/2012  . Tobacco abuse 10/16/2012  . Family history of coronary arteriosclerosis 10/16/2012  . Pure hypercholesterolemia 10/02/2012  . Essential hypertension, benign 10/02/2012  . Hyperglycemia 10/02/2012  . Anxiety 10/02/2012    Sherrilyn Rist, SPT 09/19/19, 8:52 PM  Everlean Alstrom. Graylon Good, PT, DPT 09/19/19, 8:52 PM  [facility] [address], [place], [address] Phone: [phone]   Fax:  [phone]  Name: JASARA DARBYSHIRE MRN: [id number] Date of Birth: [birth year]

## 2019-09-24 ENCOUNTER — Ambulatory Visit: Payer: PPO | Admitting: Physical Therapy

## 2019-09-24 ENCOUNTER — Other Ambulatory Visit: Payer: Self-pay

## 2019-09-24 ENCOUNTER — Encounter: Payer: Self-pay | Admitting: Physical Therapy

## 2019-09-24 DIAGNOSIS — M6281 Muscle weakness (generalized): Secondary | ICD-10-CM

## 2019-09-24 DIAGNOSIS — M25551 Pain in right hip: Secondary | ICD-10-CM | POA: Diagnosis not present

## 2019-09-24 DIAGNOSIS — G8929 Other chronic pain: Secondary | ICD-10-CM

## 2019-09-24 DIAGNOSIS — M5441 Lumbago with sciatica, right side: Secondary | ICD-10-CM

## 2019-09-24 NOTE — Therapy (Signed)
[facility] [address] [place], [address] Phone: [phone]   Fax:  [phone]  Physical Therapy Treatment  Patient Details  Name: Danielle Yates MRN: [id number] Date of Birth: 05/08/1941 Referring Provider (PT):  Einar Pheasant, MD   Encounter Date: 09/24/2019  PT End of Session - 09/24/19 1001    Visit Number  3    Number of Visits  12    Date for PT Re-Evaluation  10/29/19    Authorization Type  [employer] reporting from 09/17/2019    Authorization Time Period  certification period: 09/17/2019- 10/29/2019    Authorization - Visit Number  3    Authorization - Number of Visits  10    PT Start Time  0947    PT Stop Time  1030    PT Time Calculation (min)  43 min    Equipment Utilized During Treatment  Gait belt    Activity Tolerance  Patient tolerated treatment well    Behavior During Therapy  [facility] for tasks assessed/performed       Past Medical History:  Diagnosis Date  . Allergy   . Aortic valve sclerosis    a. TTE 2013: EF 60-65%, no RWMA, Gr1DD, trivial MR, mildly dilated LA 43 mm; b. TTE 08/2017: EF 65-70%, mild LVH, aortic valve sclerosis without stenosis, mildly dilated LA 36 mm, RV size and sys fxn nl  . Arthropathy, unspecified, site unspecified   . Cataract   . Chronic rhinitis   . Disorder of bone and cartilage, unspecified   . Diverticulosis of colon (without mention of hemorrhage)   . Essential hypertension    a. refractory HTN; b. renal US 2013 negative for RAS  . External hemorrhoids without mention of complication   . Medication intolerance   . Obesity, unspecified   . Other abnormal glucose   . Panic disorder without agoraphobia   . Peripheral arterial disease ([place])    a.  Known bilateral common iliac disease-> medical therapy-asymptomatic.  [name] Persistent atrial fibrillation ([place])    a. diagnosed 10/19/17; b. CHADS2VASc 5 (HTN, age x 2, vascular disease,  female)-->Eliquis  . Pure hypercholesterolemia   . Tobacco use disorder   . Unspecified vitamin D deficiency     Past Surgical History:  Procedure Laterality Date  . APPENDECTOMY  1985  . CHOLECYSTECTOMY  1985   with open wound  . EYE SURGERY  2011   CATARACTS -  BOTH  . Removed spot on face     [name]  . TONSILLECTOMY AND ADENOIDECTOMY  1946  . VAGINAL HYSTERECTOMY  1982   fibroid and endometriosis; one ovary remaining    There were no vitals filed for this visit.  Subjective Assessment - 09/24/19 0954    Subjective  Patient reports pain 6/10 at her lower back. Patient the worst pain was at Wednesday night and she also states she didn't do any extra activities over the weekend.    Pertinent History  Patient is a 78 y.o. female who presents to outpatient physical therapy with a referral for medical diagnosis of R hip pain. This patient's chief complaints consist of constant R hip pain and lower back pain that has been gradually increasing in severity since March 2020, leading to the following functional deficits: difficulty with house working activities, prolonged sitting/standing/walking, quality of life, lifting, etc.. 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  1 hour    How long can you stand comfortably?  20-30 mines    How long can you walk comfortably?  15 min    Diagnostic tests  X-ray Lumbar: Number diffuse multilevel degenerative change with 9 mmanterolisthesis of L4 on L5. No acute bony abnormality identified.  Aortoiliac atherosclerotic vascular disease. 2019    Patient Stated Goals  Loss more weight; start a exercise  routine; more active; Reduce the R hip pain    Currently in Pain?  Yes    Pain Score  6     Pain Onset  More than a month ago      TREATMENT:  Therapeutic exercise:to centralize symptoms and improve ROM, strength, muscular endurance, and activity tolerance required for successful completion of functional activities.  - Patient unable to proceed Nustep/bike due to back pain.  - Seated 2x 7mins marching  - Side stepping hip abduction 10x3 each side  - Sit <>stand from chair 1x10 with hand over anterior thigh   - Sit <>stand from chair 2x10 with hand assist at chair rest red thereband. - Ambulation 2 mins with gait belt and SBA  - Cuing to improve TA and gluteal muscle activation.  -Education on updated HEP with additional 3x32min walking per day.   Manual therapy: to reduce pain and tissue tension, improve range of motion, neuromodulation, in order to promote improved ability to complete functional activities. - Palpation for trigger point at R posterior back/hip. Education provided to use lacross ball for pain relief and unable to achieve proper pain relief when sitting on chair. Instruct patient to try at home with her own tennis ball.    HOME EXERCISE PROGRAM Access Code: [email or web address]       URL: [email or web address]    Date: 09/19/2019  Prepared by: Rosita Kea   Exercises Standing Hip Extension with Counter Support - 3 sets - 10 reps - 3s hold - 1x daily - 7x weekly Standing Gluteal Sets - 3 sets - 10 reps - 3s hold - 1x daily - 7x weekly Sit to Stand with Resistance Around Legs - 3 sets - 10 reps - 1x daily - 7x weekly - 3x76min walking per day.    Patient tolerated the session well and reports reduced pain at the end session. Patient demonstrated good activity tolerance today and presented well understanding of self-care for pain control and further improvement her muscle weakness with functional daily activities. Patient will continue benefit from skilled PT services  to address deficits and return to PLOF and independence, recreational activity and work.    PT Education - 09/24/19 1001    Education Details  Exercise purpose/form. Self management techniques.    Person(s) Educated  Patient    Methods  Explanation;Demonstration;Tactile cues;Verbal cues    Comprehension  Verbalized understanding;Returned demonstration;Verbal cues required;Tactile cues required       PT Short Term Goals - 09/17/19 0950      PT SHORT TERM GOAL #1   Title  Be independent with initial home exercise program for self-management of symptoms.    Baseline  initial HEP provided at IE (09/17/2019);    Time  2    Period  Weeks    Status  New    Target Date  10/01/19        PT Long Term  Goals - 09/17/19 0951      PT LONG TERM GOAL #1   Title  Be independent with a long-term home exercise program for self-management of symptoms.    Baseline  initial HEP provided at IE (09/17/2019);    Time  6    Period  Weeks    Status  New    Target Date  10/29/19      PT LONG TERM GOAL #2   Title  Demonstrate improved FOTO score by 10 units to demonstrate improvement in overall condition and self-reported functional ability.    Baseline  FOTO = 50(09/17/2019)    Time  6    Period  Weeks    Status  New    Target Date  10/29/19      PT LONG TERM GOAL #3   Title  Reduce pain with functional activities to equal or less than 1/10 to allow patient to complete usual activities including ADLs, IADLs, and social engagement with less difficulty.    Baseline  4/10 (09/17/2019)    Time  6    Period  Weeks    Status  New    Target Date  10/29/19      PT LONG TERM GOAL #4   Title  Complete community, work and/or recreational activities without limitation due to current condition.    Baseline  prolonged walking/sitting/standing; getting up and down stairs, sit <>standing; sleeping; lifting 20 bottled water(09/17/2019)    Time  6    Period  Weeks    Status  New    Target Date  10/29/19       PT LONG TERM GOAL #5   Title  Pt will increase strength of by at least 1/2 MMT grade in order to demonstrate improvement in strength and function.    Baseline  See objective notes(09/17/2019);    Time  6    Period  Weeks    Status  New    Target Date  10/29/19            Plan - 09/24/19 1002    Clinical Impression Statement  Patient tolerated the session well and reports reduced pain at the end session. Patient demonstrated good activity tolerance today and presented well understanding of self-care for pain control and further improvement her muscle weakness with functional daily activities. Patient will continue benefit from skilled PT services to address deficits and return to PLOF and independence, recreational activity and work.    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 etc.; surgeries include appendectomy, cholecystectomy, vaginal hysterectomy    Examination-Activity Limitations  Bend;Caring for Others;Carry;Sleep;Lift;Sit;Stand;Bed Mobility;Stairs;Squat    Examination-Participation Restrictions  Church;Yard Work;Community Activity    Stability/Clinical Decision Making  Stable/Uncomplicated    Rehab Potential  Good    PT Frequency  2x / week    PT Duration  6 weeks    PT Treatment/Interventions  ADLs/Self Care Home Management;Cryotherapy;Moist Heat;Stair training;Therapeutic activities;Therapeutic exercise;Gait training;Functional mobility training;Patient/family education;Manual techniques;Energy conservation;Joint Manipulations;Other (comment)   joint mobilization grades I-V   PT Next Visit Plan  Strengthening; 76min walk test; HEP update    PT Home Exercise Plan  Medbridge: [id number]    Consulted and Agree with Plan of Care  Patient       Patient will benefit from skilled therapeutic intervention in order to improve the following  deficits and impairments:  Abnormal gait, Difficulty walking, Increased  muscle spasms, Impaired sensation, Impaired tone, Decreased activity tolerance, Decreased strength, Pain  Visit Diagnosis: Pain in right hip  Muscle weakness (generalized)  Chronic right-sided low back pain with right-sided sciatica     Problem List Patient Active Problem List   Diagnosis Date Noted  . Right hip pain 09/02/2019  . Toe pain, right 04/29/2019  . Chronic obstructive pulmonary disease ([place]) 01/01/2019  . Atrial fibrillation ([place]) 10/20/2017  . Acute bronchitis 09/10/2015  . Abdominal bruit 02/02/2015  . Health care maintenance 02/02/2015  . BMI 40.0-44.9, adult ([place]) 10/06/2014  . URI (upper respiratory infection) 10/18/2013  . Left shoulder pain 10/02/2013  . Dyspnea 10/20/2012  . Abnormal EKG 10/16/2012  . Tobacco abuse 10/16/2012  . Family history of coronary arteriosclerosis 10/16/2012  . Pure hypercholesterolemia 10/02/2012  . Essential hypertension, benign 10/02/2012  . Hyperglycemia 10/02/2012  . Anxiety 10/02/2012    Sherrilyn Rist, SPT 09/24/19, 10:42 AM  Everlean Alstrom. Graylon Good, PT, DPT 09/24/19, 10:52 AM  [facility] [address], [place], [address] Phone: [phone]   Fax:  [phone]  Name: CASHAY BINGMAN MRN: [id number] Date of Birth: Oct 16, 1941

## 2019-09-26 ENCOUNTER — Ambulatory Visit: Payer: PPO | Admitting: Physical Therapy

## 2019-09-26 ENCOUNTER — Other Ambulatory Visit: Payer: Self-pay

## 2019-09-26 ENCOUNTER — Encounter: Payer: Self-pay | Admitting: Physical Therapy

## 2019-09-26 DIAGNOSIS — M6281 Muscle weakness (generalized): Secondary | ICD-10-CM

## 2019-09-26 DIAGNOSIS — M25551 Pain in right hip: Secondary | ICD-10-CM

## 2019-09-26 DIAGNOSIS — M5441 Lumbago with sciatica, right side: Secondary | ICD-10-CM

## 2019-09-26 DIAGNOSIS — G8929 Other chronic pain: Secondary | ICD-10-CM

## 2019-09-26 NOTE — Therapy (Signed)
[facility] [address] [place], [address] Phone: [phone]   Fax:  [phone]  Physical Therapy Treatment  Patient Details  Name: Danielle Yates MRN: [id number] Date of Birth: Jun 26, 1941 Referring Provider (PT):  Einar Pheasant, MD   Encounter Date: 09/26/2019  PT End of Session - 09/26/19 1015    Visit Number  4    Number of Visits  12    Date for PT Re-Evaluation  10/29/19    Authorization Type  [employer] reporting from 09/17/2019    Authorization Time Period  certification period: 09/17/2019- 10/29/2019    Authorization - Visit Number  4    Authorization - Number of Visits  10    PT Start Time  0946    PT Stop Time  1030    PT Time Calculation (min)  44 min    Activity Tolerance  Patient tolerated treatment well    Behavior During Therapy  [facility] for tasks assessed/performed       Past Medical History:  Diagnosis Date  . Allergy   . Aortic valve sclerosis    a. TTE 2013: EF 60-65%, no RWMA, Gr1DD, trivial MR, mildly dilated LA 43 mm; b. TTE 08/2017: EF 65-70%, mild LVH, aortic valve sclerosis without stenosis, mildly dilated LA 36 mm, RV size and sys fxn nl  . Arthropathy, unspecified, site unspecified   . Cataract   . Chronic rhinitis   . Disorder of bone and cartilage, unspecified   . Diverticulosis of colon (without mention of hemorrhage)   . Essential hypertension    a. refractory HTN; b. renal US 2013 negative for RAS  . External hemorrhoids without mention of complication   . Medication intolerance   . Obesity, unspecified   . Other abnormal glucose   . Panic disorder without agoraphobia   . Peripheral arterial disease ([place])    a.  Known bilateral common iliac disease-> medical therapy-asymptomatic.  [name] Persistent atrial fibrillation ([place])    a. diagnosed 10/19/17; b. CHADS2VASc 5 (HTN, age x 2, vascular disease, female)-->Eliquis  . Pure hypercholesterolemia   .  Tobacco use disorder   . Unspecified vitamin D deficiency     Past Surgical History:  Procedure Laterality Date  . APPENDECTOMY  1985  . CHOLECYSTECTOMY  1985   with open wound  . EYE SURGERY  2011   CATARACTS -  BOTH  . Removed spot on face     [name]  . TONSILLECTOMY AND ADENOIDECTOMY  1946  . VAGINAL HYSTERECTOMY  1982   fibroid and endometriosis; one ovary remaining    There were no vitals filed for this visit.  Subjective Assessment - 09/26/19 0951    Subjective  Patient reports pain 3/10 at her lower back and only muscle soreness today from exercise. She has been feeling pretty good. She also walked 10 mins yesterday with breaks at home.    Pertinent History  Patient is a 78 y.o. female who presents to outpatient physical therapy with a referral for medical diagnosis of R hip pain. This patient's chief complaints consist of constant R hip pain and lower back pain that has been gradually increasing in severity since March 2020, leading to the following functional deficits: difficulty with house working activities, prolonged sitting/standing/walking, quality of life, lifting, etc.. 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  1 hour    How long can you stand comfortably?  20-30 mines    How long can you walk comfortably?  15 min    Diagnostic tests  X-ray Lumbar: Number diffuse multilevel degenerative change with 9 mmanterolisthesis of L4 on L5. No acute bony abnormality identified.  Aortoiliac atherosclerotic vascular disease. 2019    Patient Stated Goals  Loss more weight; start a exercise routine; more active; Reduce the R hip pain     Currently in Pain?  Yes    Pain Score  3     Pain Onset  More than a month ago      TREATMENT: Therapeutic exercise:to centralize symptoms and improve ROM, strength, muscular endurance, and activity tolerance required for successful completion of functional activities. - Seated 2x 30mins marching to improve hip flexor muscles.  - Side stepping hip abduction 20x3 each side (1set with theraband above knee and 1 set with theraband below the knee (red)) - Standing gluteal activation 2x10 reps.  - Cuing to improve TA and gluteal muscle activation.   Therapeutic activities: for functional strengthening and improved functional activity tolerance. - Sit <>stand from chair 1x10 with hand over anterior thigh  -Sit <>stand from chair 2x10 with hand assist at chair rest red thereband. With green  - Half squat with cuing to block B knee flexion to encourage gluteal activation from sitting to standing phase. 3x10 reps.  -Education on updated HEP with additional 5x22min walking per day.   Manual therapy: to reduce pain and tissue tension, improve range of motion, neuromodulation, in order to promote improved ability to complete functional activities. - AAROM at L shoulder per patient request due to concerns for her shoulder pain.(unbilled 6mins)   HOME EXERCISE PROGRAM Access Code: [email or web address] URL: [email or web address] Date: 09/19/2019  Prepared by: Rosita Kea   Exercises Standing Hip Extension with Counter Support - 3 sets - 10 reps - 3s hold - 1x daily - 7x weekly Standing Gluteal Sets - 3 sets - 10 reps - 3s hold - 1x daily - 7x weekly Sit to Stand with Resistance Around Legs - 3 sets - 10 reps - 1x daily - 7x weekly - 3x29min walking per day.    Patient tolerated the session well and continue progress toward her goals.  Patient demonstrated great improvement with postural control during higher level functional activities/tasks. Patient, however, still  requires moderated cuing to perform proper gluteal muscle activation and optimal knee joint alignments. From above presentations, the pt will continue benefit from skilled PT services to address deficits and return to PLOF and independence, recreational activity and work.     PT Education - 09/26/19 1014    Education Details  Exercise purpose/form. Self management techniques.    Person(s) Educated  Patient    Methods  Explanation;Demonstration;Tactile cues;Verbal cues    Comprehension  Verbalized understanding;Returned demonstration;Verbal cues required;Tactile cues required       PT Short Term Goals - 09/26/19 1039      PT SHORT TERM GOAL #1   Title  Be independent with initial home exercise program for self-management of symptoms.    Baseline  initial HEP provided at IE (09/17/2019);    Time  2    Period  Weeks    Status  Achieved    Target Date  10/01/19        PT  Long Term Goals - 09/17/19 0951      PT LONG TERM GOAL #1   Title  Be independent with a long-term home exercise program for self-management of symptoms.    Baseline  initial HEP provided at IE (09/17/2019);    Time  6    Period  Weeks    Status  New    Target Date  10/29/19      PT LONG TERM GOAL #2   Title  Demonstrate improved FOTO score by 10 units to demonstrate improvement in overall condition and self-reported functional ability.    Baseline  FOTO = 50(09/17/2019)    Time  6    Period  Weeks    Status  New    Target Date  10/29/19      PT LONG TERM GOAL #3   Title  Reduce pain with functional activities to equal or less than 1/10 to allow patient to complete usual activities including ADLs, IADLs, and social engagement with less difficulty.    Baseline  4/10 (09/17/2019)    Time  6    Period  Weeks    Status  New    Target Date  10/29/19      PT LONG TERM GOAL #4   Title  Complete community, work and/or recreational activities without limitation due to current condition.    Baseline  prolonged  walking/sitting/standing; getting up and down stairs, sit <>standing; sleeping; lifting 20 bottled water(09/17/2019)    Time  6    Period  Weeks    Status  New    Target Date  10/29/19      PT LONG TERM GOAL #5   Title  Pt will increase strength of by at least 1/2 MMT grade in order to demonstrate improvement in strength and function.    Baseline  See objective notes(09/17/2019);    Time  6    Period  Weeks    Status  New    Target Date  10/29/19            Plan - 09/26/19 1036    Clinical Impression Statement  Patient tolerated the session well and continue progress toward her goals.  Patient demonstrated great improvement with postural control during higher level functional activities/tasks. Patient, however, still requires moderated cuing to perform proper gluteal muscle activation and optimal knee joint alignments. From above presentations, the pt will continue benefit from skilled PT services to address deficits and return to PLOF and independence, recreational activity and work.    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 etc.; surgeries include appendectomy, cholecystectomy, vaginal hysterectomy    Examination-Activity Limitations  Bend;Caring for Others;Carry;Sleep;Lift;Sit;Stand;Bed Mobility;Stairs;Squat    Examination-Participation Restrictions  Church;Yard Work;Community Activity    Stability/Clinical Decision Making  Stable/Uncomplicated    Rehab Potential  Good    PT Frequency  2x / week    PT Duration  6 weeks    PT Treatment/Interventions  ADLs/Self Care Home Management;Cryotherapy;Moist Heat;Stair training;Therapeutic activities;Therapeutic exercise;Gait training;Functional mobility training;Patient/family education;Manual techniques;Energy conservation;Joint Manipulations;Other (comment)   joint mobilization grades I-V   PT  Next Visit Plan  Strengthening; 6min walk test; HEP update    PT Home Exercise Plan  Medbridge: [id number]    Consulted and Agree with Plan of Care  Patient       Patient will benefit from skilled therapeutic intervention in order to improve the following  deficits and impairments:  Abnormal gait, Difficulty walking, Increased muscle spasms, Impaired sensation, Impaired tone, Decreased activity tolerance, Decreased strength, Pain  Visit Diagnosis: Pain in right hip  Muscle weakness (generalized)  Chronic right-sided low back pain with right-sided sciatica     Problem List Patient Active Problem List   Diagnosis Date Noted  . Right hip pain 09/02/2019  . Toe pain, right 04/29/2019  . Chronic obstructive pulmonary disease ([place]) 01/01/2019  . Atrial fibrillation ([place]) 10/20/2017  . Acute bronchitis 09/10/2015  . Abdominal bruit 02/02/2015  . Health care maintenance 02/02/2015  . BMI 40.0-44.9, adult ([place]) 10/06/2014  . URI (upper respiratory infection) 10/18/2013  . Left shoulder pain 10/02/2013  . Dyspnea 10/20/2012  . Abnormal EKG 10/16/2012  . Tobacco abuse 10/16/2012  . Family history of coronary arteriosclerosis 10/16/2012  . Pure hypercholesterolemia 10/02/2012  . Essential hypertension, benign 10/02/2012  . Hyperglycemia 10/02/2012  . Anxiety 10/02/2012   .Sherrilyn Rist, SPT 09/26/19, 10:40 AM  Everlean Alstrom. Graylon Good, PT, DPT 09/26/19, 11:08 AM  [facility] [address], [place], [address] Phone: [phone]   Fax:  [phone]  Name: IREENE PIRAINO MRN: [id number] Date of Birth: 04/08/1941

## 2019-10-01 ENCOUNTER — Ambulatory Visit: Payer: PPO | Admitting: Physical Therapy

## 2019-10-01 ENCOUNTER — Encounter: Payer: Self-pay | Admitting: Physical Therapy

## 2019-10-01 ENCOUNTER — Other Ambulatory Visit: Payer: Self-pay

## 2019-10-01 DIAGNOSIS — M5441 Lumbago with sciatica, right side: Secondary | ICD-10-CM

## 2019-10-01 DIAGNOSIS — G8929 Other chronic pain: Secondary | ICD-10-CM

## 2019-10-01 DIAGNOSIS — M6281 Muscle weakness (generalized): Secondary | ICD-10-CM

## 2019-10-01 DIAGNOSIS — M25551 Pain in right hip: Secondary | ICD-10-CM

## 2019-10-01 NOTE — Therapy (Signed)
[facility] [address] [place], [address] Phone: [phone]   Fax:  [phone]  Physical Therapy Treatment  Patient Details  Name: Danielle Yates MRN: [id number] Date of Birth: 10-20-1941 Referring Provider (PT):  Einar Pheasant, MD   Encounter Date: 10/01/2019  PT End of Session - 10/01/19 0951    Visit Number  5    Number of Visits  12    Date for PT Re-Evaluation  10/29/19    Authorization Type  [employer] reporting from 09/17/2019    Authorization Time Period  certification period: 09/17/2019- 10/29/2019    Authorization - Visit Number  5    Authorization - Number of Visits  10    PT Start Time  0945    PT Stop Time  1025    PT Time Calculation (min)  40 min    Activity Tolerance  Patient tolerated treatment well    Behavior During Therapy  [facility] for tasks assessed/performed       Past Medical History:  Diagnosis Date  . Allergy   . Aortic valve sclerosis    a. TTE 2013: EF 60-65%, no RWMA, Gr1DD, trivial MR, mildly dilated LA 43 mm; b. TTE 08/2017: EF 65-70%, mild LVH, aortic valve sclerosis without stenosis, mildly dilated LA 36 mm, RV size and sys fxn nl  . Arthropathy, unspecified, site unspecified   . Cataract   . Chronic rhinitis   . Disorder of bone and cartilage, unspecified   . Diverticulosis of colon (without mention of hemorrhage)   . Essential hypertension    a. refractory HTN; b. renal US 2013 negative for RAS  . External hemorrhoids without mention of complication   . Medication intolerance   . Obesity, unspecified   . Other abnormal glucose   . Panic disorder without agoraphobia   . Peripheral arterial disease ([place])    a.  Known bilateral common iliac disease-> medical therapy-asymptomatic.  [name] Persistent atrial fibrillation ([place])    a. diagnosed 10/19/17; b. CHADS2VASc 5 (HTN, age x 2, vascular disease, female)-->Eliquis  . Pure hypercholesterolemia   .  Tobacco use disorder   . Unspecified vitamin D deficiency     Past Surgical History:  Procedure Laterality Date  . APPENDECTOMY  1985  . CHOLECYSTECTOMY  1985   with open wound  . EYE SURGERY  2011   CATARACTS -  BOTH  . Removed spot on face     [name]  . TONSILLECTOMY AND ADENOIDECTOMY  1946  . VAGINAL HYSTERECTOMY  1982   fibroid and endometriosis; one ovary remaining    There were no vitals filed for this visit.  Subjective Assessment - 10/01/19 0946    Subjective  Patient reports she was on her feet all day yesterday in very flat shoes prepping for starting her diet. She didn't feel anything during that time but awoke with a lot pain her her back. Describe across low back, both sides. She took a tylenol and was able to go back to sleep. She states once she got going today she feels okay. No pain currently at rest. She rates her pain as 3/10 only when she moves in her familiar gluteal region. She has been doing her walking 8-10 minutes a day now and this is the main exercise she has been doing lately. She would like to get up to 15 minutes of walking a day.    Pertinent History  Patient is a 78 y.o. female  who presents to outpatient physical therapy with a referral for medical diagnosis of R hip pain. This patient's chief complaints consist of constant R hip pain and lower back pain that has been gradually increasing in severity since March 2020, leading to the following functional deficits: difficulty with house working activities, prolonged sitting/standing/walking, quality of life, lifting, etc.. 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  1 hour    How long can you stand comfortably?  20-30 mines    How long can you walk comfortably?  15 min    Diagnostic tests  X-ray Lumbar: Number diffuse multilevel degenerative change with 9 mmanterolisthesis of L4 on L5. No acute bony abnormality identified.  Aortoiliac atherosclerotic vascular disease. 2019    Patient Stated Goals  Loss more weight; start a exercise routine; more active; Reduce the R hip pain    Currently in Pain?  Yes    Pain Score  2     Pain Onset  More than a month ago         TREATMENT: Therapeutic exercise:to centralize symptoms and improve ROM, strength, muscular endurance, and activity tolerance required for successful completion of functional activities. - Seated2x32mins marching to improve hip flexor muscles and trunk control and endurance. - Side steppinghip abductiontwo sets 20x3each side (1set with theraband above knee and 1 set with theraband below the knee (red)   - Cuing to improve TA and gluteal muscle activation.   Lumbar AROM: - Flexion: = 2 inches above ankles - Extension: = minimal extension at lumbar spine. - Rotation: R = WNL but concordant ERP, L = WNL - Side Flexion: R = superioir patella, L = fibular head AROM limited by stiffness  REPEATED MOTIONS TESTING:  Motion/Technique sets x reps During After ROM Functional test** [place] Comments  Flexion in standing x10 decreasing  no better      Extension in standing x10 No pain Mildly worse No change     R side bending x10 ERP each time No worse No change         Therapeutic activities: for functional strengthening and improved functional activity tolerance. - Sit <>stand from chair1x10with hand over anterior thigh - seated abduction with glute and TA squeeze with feet on floor hip width apart. 3 second hold x 10. No back support to improve trunk activation. To prepare for sit <> stand while maintaining hip abduction. cuing for position and coordination, hold  times.  -Sit <>stand from chair2x10 withhand assist at chair rest (1st set) and holding 4# DB as counterweight 2nd set (2nd set)red thereband around knees. With green chair.  - Half squat with cuing to block B knee flexion to encourage gluteal activation from sitting to standing phase. 3x10 reps. (no target, large spike half-ball as target in seat, small spike half-ball as target). Shoulder shrug noted at bottom of squat with lowest target.  Cuing for use of glutes and TA contraction throughout activity.   -Education onupdatedHEPfor lumbar extension to improve lumbar ROM and be able to stand with more upright posture.   Manual therapy:to reduce pain and tissue tension, improve range of motion, neuromodulation, in order to promote improved ability to complete functional activities. - AAROM at L shoulder per patient request due  to concerns for her shoulder pain.(unbilled 51mins)   HOME EXERCISE PROGRAM Access Code: [email or web address] URL: [email or web address] Date: 09/19/2019  Prepared by: Rosita Kea   Exercises Standing Hip Extension with Counter Support - 3 sets - 10 reps - 3s hold - 1x daily - 7x weekly Standing Gluteal Sets - 3 sets - 10 reps - 3s hold - 1x daily - 7x weekly Sit to Stand with Resistance Around Legs - 3 sets - 10 reps - 1x daily - 7x weekly - 3x63min walking per day.   Patient tolerated treatment well with no increase in concordant pain by end of session. Patient was very stiff in lumbar extension and R sidebending with reproduction of concordant pain with R sidebending. No significant change with preliminary repeated motions testing. Continued progressive functional strengthening with focus on  posterior chain conditioning, core strength, and posture. Pt required multimodal cuing for proper technique and to facilitate improved neuromuscular control, strength, range of motion, and functional ability resulting in improved performance and form.  Patient would benefit from continued physical therapy to address remaining impairments and functional limitations to work towards stated goals and return to PLOF or maximal functional independence.      PT Education - 10/01/19 0951    Education Details  Exercise purpose/form. Self management techniques.    Person(s) Educated  Patient    Methods  Explanation;Demonstration;Tactile cues;Verbal cues    Comprehension  Verbalized understanding;Returned demonstration       PT Short Term Goals - 09/26/19 1039      PT SHORT TERM GOAL #1   Title  Be independent with initial home exercise program for self-management of symptoms.    Baseline  initial HEP provided at IE (09/17/2019);    Time  2    Period  Weeks    Status  Achieved    Target Date  10/01/19        PT Long Term Goals - 09/17/19 0951      PT LONG TERM GOAL #1   Title  Be independent with a long-term home exercise program for self-management of symptoms.    Baseline  initial HEP provided at IE (09/17/2019);    Time  6    Period  Weeks    Status  New    Target Date  10/29/19      PT LONG TERM GOAL #2   Title  Demonstrate improved FOTO score by 10 units to demonstrate improvement in overall condition and self-reported functional ability.    Baseline  FOTO = 50(09/17/2019)    Time  6    Period  Weeks    Status  New    Target Date  10/29/19      PT LONG TERM GOAL #3   Title  Reduce pain with functional activities to equal or less than 1/10 to allow patient to complete usual activities including ADLs, IADLs, and social engagement with less difficulty.    Baseline  4/10 (09/17/2019)    Time  6    Period  Weeks    Status  New    Target Date  10/29/19      PT LONG TERM GOAL #4   Title  Complete community, work and/or recreational activities without limitation due to current condition.    Baseline  prolonged walking/sitting/standing; getting up and down stairs, sit <>standing; sleeping; lifting 20 bottled water(09/17/2019)     Time  6    Period  Weeks    Status  New    Target  Date  10/29/19      PT LONG TERM GOAL #5   Title  Pt will increase strength of by at least 1/2 MMT grade in order to demonstrate improvement in strength and function.    Baseline  See objective notes(09/17/2019);    Time  6    Period  Weeks    Status  New    Target Date  10/29/19            Plan - 10/01/19 1032    Clinical Impression Statement  Patient tolerated treatment well with no increase in concordant pain by end of session. Patient was very stiff in lumbar extension and R sidebending with reproduction of concordant pain with R sidebending. No significant change with preliminary repeated motions testing. Continued progressive functional strengthening with focus on  posterior chain conditioning, core strength, and posture. Pt required multimodal cuing for proper technique and to facilitate improved neuromuscular control, strength, range of motion, and functional ability resulting in improved performance and form. Patient would benefit from continued physical therapy to address remaining impairments and functional limitations to work towards stated goals and return to PLOF or maximal functional independence.    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 etc.; surgeries include appendectomy, cholecystectomy, vaginal hysterectomy    Examination-Activity Limitations  Bend;Caring for Others;Carry;Sleep;Lift;Sit;Stand;Bed Mobility;Stairs;Squat    Examination-Participation Restrictions  Church;Yard Work;Community Activity    Stability/Clinical Decision Making  Stable/Uncomplicated    Rehab Potential  Good    PT Frequency  2x / week    PT Duration  6 weeks    PT Treatment/Interventions  ADLs/Self Care Home Management;Cryotherapy;Moist Heat;Stair training;Therapeutic activities;Therapeutic  exercise;Gait training;Functional mobility training;Patient/family education;Manual techniques;Energy conservation;Joint Manipulations;Other (comment)   joint mobilization grades I-V   PT Next Visit Plan  Strengthening; 9min walk test; HEP update    PT Home Exercise Plan  Medbridge: [email or web address]    Consulted and Agree with Plan of Care  Patient       Patient will benefit from skilled therapeutic intervention in order to improve the following deficits and impairments:  Abnormal gait, Difficulty walking, Increased muscle spasms, Impaired sensation, Impaired tone, Decreased activity tolerance, Decreased strength, Pain  Visit Diagnosis: Pain in right hip  Muscle weakness (generalized)  Chronic right-sided low back pain with right-sided sciatica     Problem List Patient Active Problem List   Diagnosis Date Noted  . Right hip pain 09/02/2019  . Toe pain, right 04/29/2019  . Chronic obstructive pulmonary disease ([place]) 01/01/2019  . Atrial fibrillation ([place]) 10/20/2017  . Acute bronchitis 09/10/2015  . Abdominal bruit 02/02/2015  . Health care maintenance 02/02/2015  . BMI 40.0-44.9, adult ([place]) 10/06/2014  . URI (upper respiratory infection) 10/18/2013  . Left shoulder pain 10/02/2013  . Dyspnea 10/20/2012  . Abnormal EKG 10/16/2012  . Tobacco abuse 10/16/2012  . Family history of coronary arteriosclerosis 10/16/2012  . Pure hypercholesterolemia 10/02/2012  . Essential hypertension, benign 10/02/2012  . Hyperglycemia 10/02/2012  . Anxiety 10/02/2012    Everlean Alstrom. Graylon Good, PT, DPT 10/01/19, 10:33 AM  [facility] [address] [place], [address] Phone: [phone]   Fax:  [phone]  Name: Danielle Yates MRN: [id number] Date of Birth: 29-Dec-1940

## 2019-10-03 ENCOUNTER — Encounter: Payer: Self-pay | Admitting: Physical Therapy

## 2019-10-03 ENCOUNTER — Ambulatory Visit: Payer: PPO | Admitting: Physical Therapy

## 2019-10-03 ENCOUNTER — Other Ambulatory Visit: Payer: Self-pay

## 2019-10-03 DIAGNOSIS — G8929 Other chronic pain: Secondary | ICD-10-CM

## 2019-10-03 DIAGNOSIS — M25551 Pain in right hip: Secondary | ICD-10-CM

## 2019-10-03 DIAGNOSIS — M5441 Lumbago with sciatica, right side: Secondary | ICD-10-CM

## 2019-10-03 DIAGNOSIS — M6281 Muscle weakness (generalized): Secondary | ICD-10-CM

## 2019-10-03 NOTE — Therapy (Signed)
[facility] [address] [place], [address] Phone: [phone]   Fax:  [phone]  Physical Therapy Treatment  Patient Details  Name: Danielle Yates MRN: [id number] Date of Birth: [birth year] Referring Provider (PT):  Einar Pheasant, MD   Encounter Date: 10/03/2019  PT End of Session - 10/03/19 0947    Visit Number  6    Number of Visits  12    Date for PT Re-Evaluation  10/29/19    Authorization Type  [employer] reporting from 09/17/2019    Authorization Time Period  certification period: 09/17/2019- 10/29/2019    Authorization - Visit Number  6    Authorization - Number of Visits  10    PT Start Time  0948    PT Stop Time  1020    PT Time Calculation (min)  32 min    Activity Tolerance  Patient tolerated treatment well    Behavior During Therapy  [facility] for tasks assessed/performed       Past Medical History:  Diagnosis Date  . Allergy   . Aortic valve sclerosis    a. TTE 2013: EF 60-65%, no RWMA, Gr1DD, trivial MR, mildly dilated LA 43 mm; b. TTE 08/2017: EF 65-70%, mild LVH, aortic valve sclerosis without stenosis, mildly dilated LA 36 mm, RV size and sys fxn nl  . Arthropathy, unspecified, site unspecified   . Cataract   . Chronic rhinitis   . Disorder of bone and cartilage, unspecified   . Diverticulosis of colon (without mention of hemorrhage)   . Essential hypertension    a. refractory HTN; b. renal US 2013 negative for RAS  . External hemorrhoids without mention of complication   . Medication intolerance   . Obesity, unspecified   . Other abnormal glucose   . Panic disorder without agoraphobia   . Peripheral arterial disease ([place])    a.  Known bilateral common iliac disease-> medical therapy-asymptomatic.  [name] Persistent atrial fibrillation ([place])    a. diagnosed 10/19/17; b. CHADS2VASc 5 (HTN, age x 2, vascular disease, female)-->Eliquis  . Pure hypercholesterolemia   .  Tobacco use disorder   . Unspecified vitamin D deficiency     Past Surgical History:  Procedure Laterality Date  . APPENDECTOMY  1985  . CHOLECYSTECTOMY  1985   with open wound  . EYE SURGERY  2011   CATARACTS -  BOTH  . Removed spot on face     [name]  . TONSILLECTOMY AND ADENOIDECTOMY  1946  . VAGINAL HYSTERECTOMY  1982   fibroid and endometriosis; one ovary remaining    There were no vitals filed for this visit.  Subjective Assessment - 10/03/19 0947    Subjective  Patient reports slight soreness of bilateral posterior and lateral thigh muscles since last sessoin but only very minor pain 1-2/10 at lower back today. No pain after moving around. Paitent requested to leave early 10 min today due to another appointment.    Pertinent History  Patient is a 78 y.o. female who presents to outpatient physical therapy with a referral for medical diagnosis of R hip pain. This patient's chief complaints consist of constant R hip pain and lower back pain that has been gradually increasing in severity since March 2020, leading to the following functional deficits: difficulty with house working activities, prolonged sitting/standing/walking, quality of life, lifting, etc.. 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  1 hour    How long can you stand comfortably?  20-30 mines    How long can you walk comfortably?  15 min    Diagnostic tests  X-ray Lumbar: Number diffuse multilevel degenerative change with 9 mmanterolisthesis of L4 on L5. No acute bony abnormality identified.  Aortoiliac atherosclerotic vascular disease. 2019    Patient Stated Goals   Loss more weight; start a exercise routine; more active; Reduce the R hip pain    Currently in Pain?  Yes    Pain Score  1     Pain Location  Back    Pain Onset  More than a month ago        TREATMENT: Therapeutic exercise:to centralize symptoms and improve ROM, strength, muscular endurance, and activity tolerance required for successful completion of functional activities. - Seated2x69mins marchingto improve hip flexor muscles and trunk control and endurance. - Side steppinghip abductiontwo sets 20x3each side(both sets with theraband below the knee (red)   - Seated knee extension with red therapy band tied on chair 3x10 sets with 3 s hold.  - Seated shoulder Abduction with 2lb DB each hand cuing to improve sitting without back support and TA activation for lumbar stability. - Ambulation x 50 feet to assess pain control between exercises. Patient reports improved balance and endurance with walking.  - Cuing to improve forms and hand postures for upper body stability.  - Education on HEP with progressed 8x2  mins of walking a day.    Therapeutic activities: for functional strengthening and improved functional activity tolerance. - Sit <>stand from chair1x10with hand over anterior thigh -Sit <>stand from chair2x10 withhand assist at chair rest. Cuing provided to improve separation of knees to encourage hip abductor activation.  - Half squat with cuing to block B knee flexion to encourage gluteal activation from sitting to standing phase. 2x10 reps.small spike ball as a target. Tactile cuing provided to prevent knee excessive flexion. Cuing for use of glutes and TA contraction throughout activity.     HOME EXERCISE PROGRAM Access Code: [email or web address] URL: [email or web address] Date: 09/19/2019  Prepared by: Rosita Kea   Exercises Standing Hip Extension with Counter Support - 3 sets - 10 reps - 3s hold - 1x daily - 7x weekly Standing Gluteal Sets -  3 sets - 10 reps - 3s hold - 1x daily - 7x weekly Sit to Stand with Resistance Around Legs - 3 sets - 10 reps - 1x daily - 7x weekly - 8x2 min walking per day.   Patient tolerated treatment well and continue progress towards her goals. Patient demonstrated improved forms and techniques towards her current exercises but still requires mild to moderate cuing to improve gluteal activation during functional activities (Sit to stand). Patient presented improved self-care pain control and exercise habits which will continue benefit from continued physical therapy to address remaining impairments and functional limitations to work towards stated goals and return to PLOF or maximal functional independence.         PT Education - 10/03/19 0947    Education Details  Exercise purpose/form. Self management techniques.    Person(s) Educated  Patient    Methods  Explanation;Demonstration;Verbal cues    Comprehension  Verbalized understanding;Returned demonstration;Verbal cues required;Tactile cues required       PT Short Term Goals -  09/26/19 1039      PT SHORT TERM GOAL #1   Title  Be independent with initial home exercise program for self-management of symptoms.    Baseline  initial HEP provided at IE (09/17/2019);    Time  2    Period  Weeks    Status  Achieved    Target Date  10/01/19        PT Long Term Goals - 09/17/19 0951      PT LONG TERM GOAL #1   Title  Be independent with a long-term home exercise program for self-management of symptoms.    Baseline  initial HEP provided at IE (09/17/2019);    Time  6    Period  Weeks    Status  New    Target Date  10/29/19      PT LONG TERM GOAL #2   Title  Demonstrate improved FOTO score by 10 units to demonstrate improvement in overall condition and self-reported functional ability.    Baseline  FOTO = 50(09/17/2019)    Time  6    Period  Weeks    Status  New    Target Date  10/29/19      PT LONG TERM GOAL #3   Title  Reduce  pain with functional activities to equal or less than 1/10 to allow patient to complete usual activities including ADLs, IADLs, and social engagement with less difficulty.    Baseline  4/10 (09/17/2019)    Time  6    Period  Weeks    Status  New    Target Date  10/29/19      PT LONG TERM GOAL #4   Title  Complete community, work and/or recreational activities without limitation due to current condition.    Baseline  prolonged walking/sitting/standing; getting up and down stairs, sit <>standing; sleeping; lifting 20 bottled water(09/17/2019)    Time  6    Period  Weeks    Status  New    Target Date  10/29/19      PT LONG TERM GOAL #5   Title  Pt will increase strength of by at least 1/2 MMT grade in order to demonstrate improvement in strength and function.    Baseline  See objective notes(09/17/2019);    Time  6    Period  Weeks    Status  New    Target Date  10/29/19            Plan - 10/03/19 1010    Clinical Impression Statement  Patient tolerated treatment well and continue progress towards her goals. Patient demonstrated improved forms and techniques towards her current exercises but still requires mild to moderate cuing to improve gluteal activation during functional activities (Sit to stand). Patient presented improved self-care pain control and exercise habits which will continue benefit from continued physical therapy to address remaining impairments and functional limitations to work towards stated goals and return to PLOF or maximal functional independence.    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 etc.; surgeries include appendectomy, cholecystectomy, vaginal hysterectomy    Examination-Activity Limitations  Bend;Caring for Others;Carry;Sleep;Lift;Sit;Stand;Bed Mobility;Stairs;Squat    Examination-Participation Restrictions   Church;Yard Work;Community Activity    Stability/Clinical Decision Making  Stable/Uncomplicated    Rehab Potential  Good    PT Frequency  2x / week    PT Duration  6 weeks  PT Treatment/Interventions  ADLs/Self Care Home Management;Cryotherapy;Moist Heat;Stair training;Therapeutic activities;Therapeutic exercise;Gait training;Functional mobility training;Patient/family education;Manual techniques;Energy conservation;Joint Manipulations;Other (comment)   joint mobilization grades I-V   PT Next Visit Plan  Strengthening; 110min walk test; HEP update    PT Home Exercise Plan  Medbridge: [id number]    Consulted and Agree with Plan of Care  Patient       Patient will benefit from skilled therapeutic intervention in order to improve the following deficits and impairments:  Abnormal gait, Difficulty walking, Increased muscle spasms, Impaired sensation, Impaired tone, Decreased activity tolerance, Decreased strength, Pain  Visit Diagnosis: Pain in right hip  Muscle weakness (generalized)  Chronic right-sided low back pain with right-sided sciatica     Problem List Patient Active Problem List   Diagnosis Date Noted  . Right hip pain 09/02/2019  . Toe pain, right 04/29/2019  . Chronic obstructive pulmonary disease ([place]) 01/01/2019  . Atrial fibrillation ([place]) 10/20/2017  . Acute bronchitis 09/10/2015  . Abdominal bruit 02/02/2015  . Health care maintenance 02/02/2015  . BMI 40.0-44.9, adult ([place]) 10/06/2014  . URI (upper respiratory infection) 10/18/2013  . Left shoulder pain 10/02/2013  . Dyspnea 10/20/2012  . Abnormal EKG 10/16/2012  . Tobacco abuse 10/16/2012  . Family history of coronary arteriosclerosis 10/16/2012  . Pure hypercholesterolemia 10/02/2012  . Essential hypertension, benign 10/02/2012  . Hyperglycemia 10/02/2012  . Anxiety 10/02/2012    Sherrilyn Rist, SPT 10/03/19, 2:23 PM  Everlean Alstrom. Graylon Good, PT, DPT 10/03/19, 2:26 PM   [facility] [address], [place], [address] Phone: [phone]   Fax:  [phone]  Name: Danielle Yates MRN: [id number] Date of Birth: Feb 13, 1941

## 2019-10-08 ENCOUNTER — Other Ambulatory Visit: Payer: Self-pay

## 2019-10-08 ENCOUNTER — Ambulatory Visit: Payer: PPO | Attending: Internal Medicine | Admitting: Physical Therapy

## 2019-10-08 ENCOUNTER — Encounter: Payer: Self-pay | Admitting: Physical Therapy

## 2019-10-08 DIAGNOSIS — M5441 Lumbago with sciatica, right side: Secondary | ICD-10-CM | POA: Diagnosis not present

## 2019-10-08 DIAGNOSIS — M6281 Muscle weakness (generalized): Secondary | ICD-10-CM | POA: Diagnosis not present

## 2019-10-08 DIAGNOSIS — M25551 Pain in right hip: Secondary | ICD-10-CM | POA: Insufficient documentation

## 2019-10-08 DIAGNOSIS — G8929 Other chronic pain: Secondary | ICD-10-CM

## 2019-10-08 NOTE — Therapy (Signed)
[facility] [address], [place], [address] Phone: [phone]   Fax:  [phone]  Physical Therapy Treatment  Patient Details  Name: Danielle Yates MRN: [id number] Date of Birth: 04/26/1941 Referring Provider (PT):  Einar Pheasant, MD   Encounter Date: 10/08/2019  PT End of Session - 10/08/19 0959    Visit Number  7    Number of Visits  12    Date for PT Re-Evaluation  10/29/19    Authorization Type  [employer] reporting from 09/17/2019    Authorization Time Period  certification period: 09/17/2019- 10/29/2019    Authorization - Visit Number  7    Authorization - Number of Visits  10    PT Start Time  0945    PT Stop Time  1030    PT Time Calculation (min)  45 min    Activity Tolerance  Patient tolerated treatment well    Behavior During Therapy  [facility] for tasks assessed/performed       Past Medical History:  Diagnosis Date  . Allergy   . Aortic valve sclerosis    a. TTE 2013: EF 60-65%, no RWMA, Gr1DD, trivial MR, mildly dilated LA 43 mm; b. TTE 08/2017: EF 65-70%, mild LVH, aortic valve sclerosis without stenosis, mildly dilated LA 36 mm, RV size and sys fxn nl  . Arthropathy, unspecified, site unspecified   . Cataract   . Chronic rhinitis   . Disorder of bone and cartilage, unspecified   . Diverticulosis of colon (without mention of hemorrhage)   . Essential hypertension    a. refractory HTN; b. renal US 2013 negative for RAS  . External hemorrhoids without mention of complication   . Medication intolerance   . Obesity, unspecified   . Other abnormal glucose   . Panic disorder without agoraphobia   . Peripheral arterial disease ([place])    a.  Known bilateral common iliac disease-> medical therapy-asymptomatic.  [name] Persistent atrial fibrillation ([place])    a. diagnosed 10/19/17; b. CHADS2VASc 5 (HTN, age x 2, vascular disease, female)-->Eliquis  . Pure hypercholesterolemia   . Tobacco  use disorder   . Unspecified vitamin D deficiency     Past Surgical History:  Procedure Laterality Date  . APPENDECTOMY  1985  . CHOLECYSTECTOMY  1985   with open wound  . EYE SURGERY  2011   CATARACTS -  BOTH  . Removed spot on face     [name]  . TONSILLECTOMY AND ADENOIDECTOMY  1946  . VAGINAL HYSTERECTOMY  1982   fibroid and endometriosis; one ovary remaining    There were no vitals filed for this visit.  Subjective Assessment - 10/08/19 0955    Subjective  Patient reports 1/10 pain and has been doing HEP at home without any difficulties. Patient also has been participating her walking program 10 mins per day and 23min each.    Pertinent History  Patient is a 78 y.o. female who presents to outpatient physical therapy with a referral for medical diagnosis of R hip pain. This patient's chief complaints consist of constant R hip pain and lower back pain that has been gradually increasing in severity since March 2020, leading to the following functional deficits: difficulty with house working activities, prolonged sitting/standing/walking, quality of life, lifting, etc.. Relevant past medical history and comorbidities include persistent atrial fibrillation, peripheral arterildisease, panic, abnormal glucose, obesity, anterolisthesis of L4 on L5 etc.; surgeries include appendectomy, cholecystectomy, vaginal hysterectomy, and etc., (See  more details in chart.)    Limitations  Sitting;Lifting;Walking;House hold activities;Standing    How long can you sit comfortably?  1 hour    How long can you stand comfortably?  20-30 mines    How long can you walk comfortably?  15 min    Diagnostic tests  X-ray Lumbar: Number diffuse multilevel degenerative change with 9 mmanterolisthesis of L4 on L5. No acute bony abnormality identified.  Aortoiliac atherosclerotic vascular disease. 2019    Patient Stated Goals  Loss more weight; start a exercise routine; more active; Reduce the R hip pain    Currently in  Pain?  Yes    Pain Score  1     Pain Location  Back    Pain Onset  More than a month ago      TREATMENT:  Therapeutic exercise: to centralize symptoms and improve ROM, strength, muscular endurance, and activity tolerance required for successful completion of functional activities.  - Ambulation with SBA to improve muscle endurance and activity tolerance x 61mins. Cuing to improve posture and forms.  - Side stepping hip abduction two sets 20x3 each side (both sets with theraband below the knee (red)   - Seated B hip abductions 4x10 reps with therapy band red below knee.  - Seated knee B extension/and B hip abduction with red therapy band tied on chair 3x10 sets. Cuing to improve sitting upright without using back rest. - Seated shoulder external rotation with red therapy band. 2x10 reps. To improve core stability and TA activation.   - Education on HEP with progressed 3x4 mins of walking a day.  - Seated pulley exercises to improve shoulder ROM per patient request. (unbilled 41mins)     Therapeutic activities: for functional strengthening and improved functional activity tolerance.  With red therapy band - Sit <>stand from chair 2x10 with hand over anterior thigh - Sit <>stand from chair 1x10 without hand support. Cuing provided to improve separation of knees to encourag B hip abductor activation.  - Half squat with cuing to block B knee flexion to encourage gluteal activation from sitting to standing phase. 3x10 reps. small spike ball as a target. Tactile cuing provided to prevent knee excessive flexion. Cuing for use of glutes and TA contraction throughout activity.        HOME EXERCISE PROGRAM Access Code: [email or web address]       URL: [email or web address]    Date: 09/19/2019  Prepared by: Rosita Kea    Exercises Standing Hip Extension with Counter Support - 3 sets - 10 reps - 3s hold - 1x daily - 7x weekly Standing Gluteal Sets - 3 sets - 10 reps - 3s hold - 1x daily - 7x  weekly Sit to Stand with Resistance Around Legs - 3 sets - 10 reps - 1x daily - 7x weekly - 8x2 min walking per day.      Patient tolerated treatment well and continue progress towards her goals. Patient demonstrated improved activity tolerance (walking) as well as strength and power (sit to stand). Patient has been developing exercises habit but still requires cuing to maintain walking programs daily. Patient will continue benefit from continued physical therapy to address remaining impairments and functional limitations to work towards stated goals and return to PLOF or maximal functional independence.     PT Education - 10/08/19 0959    Education Details  Exercise purpose/form. Self management techniques.    Person(s) Educated  Patient    Methods  Explanation;Demonstration;Tactile cues;Verbal cues    Comprehension  Verbalized understanding;Returned demonstration;Verbal cues required;Tactile cues required       PT Short Term Goals - 09/26/19 1039      PT SHORT TERM GOAL #1   Title  Be independent with initial home exercise program for self-management of symptoms.    Baseline  initial HEP provided at IE (09/17/2019);    Time  2    Period  Weeks    Status  Achieved    Target Date  10/01/19        PT Long Term Goals - 09/17/19 0951      PT LONG TERM GOAL #1   Title  Be independent with a long-term home exercise program for self-management of symptoms.    Baseline  initial HEP provided at IE (09/17/2019);    Time  6    Period  Weeks    Status  New    Target Date  10/29/19      PT LONG TERM GOAL #2   Title  Demonstrate improved FOTO score by 10 units to demonstrate improvement in overall condition and self-reported functional ability.    Baseline  FOTO = 50(09/17/2019)    Time  6    Period  Weeks    Status  New    Target Date  10/29/19      PT LONG TERM GOAL #3   Title  Reduce pain with functional activities to equal or less than 1/10 to allow patient to complete usual  activities including ADLs, IADLs, and social engagement with less difficulty.    Baseline  4/10 (09/17/2019)    Time  6    Period  Weeks    Status  New    Target Date  10/29/19      PT LONG TERM GOAL #4   Title  Complete community, work and/or recreational activities without limitation due to current condition.    Baseline  prolonged walking/sitting/standing; getting up and down stairs, sit <>standing; sleeping; lifting 20 bottled water(09/17/2019)    Time  6    Period  Weeks    Status  New    Target Date  10/29/19      PT LONG TERM GOAL #5   Title  Pt will increase strength of by at least 1/2 MMT grade in order to demonstrate improvement in strength and function.    Baseline  See objective notes(09/17/2019);    Time  6    Period  Weeks    Status  New    Target Date  10/29/19            Plan - 10/08/19 1002    Clinical Impression Statement  Patient tolerated treatment well and continue progress towards her goals. Patient demonstrated improved activity tolerance (walking) as well as strength and power (sit to stand). Patient has been developing exercises habit but still requires cuing to maintain walking programs daily. Patient will continue benefit from continued physical therapy to address remaining impairments and functional limitations to work towards stated goals and return to PLOF or maximal functional independence.    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 etc.; surgeries include appendectomy, cholecystectomy, vaginal hysterectomy    Examination-Activity Limitations  Bend;Caring for Others;Carry;Sleep;Lift;Sit;Stand;Bed Mobility;Stairs;Squat    Examination-Participation Restrictions  Church;Yard Work;Community Activity    Stability/Clinical Decision Making  Stable/Uncomplicated    Rehab Potential  Good    PT Frequency  2x / week  PT Duration  6 weeks    PT Treatment/Interventions  ADLs/Self Care Home Management;Cryotherapy;Moist Heat;Stair training;Therapeutic activities;Therapeutic exercise;Gait training;Functional mobility training;Patient/family education;Manual techniques;Energy conservation;Joint Manipulations;Other (comment)   joint mobilization grades I-V   PT Next Visit Plan  Strengthening; 57min walk test; HEP update    PT Home Exercise Plan  Medbridge: [id number]    Consulted and Agree with Plan of Care  Patient       Patient will benefit from skilled therapeutic intervention in order to improve the following deficits and impairments:  Abnormal gait, Difficulty walking, Increased muscle spasms, Impaired sensation, Impaired tone, Decreased activity tolerance, Decreased strength, Pain  Visit Diagnosis: Pain in right hip  Muscle weakness (generalized)  Chronic right-sided low back pain with right-sided sciatica     Problem List Patient Active Problem List   Diagnosis Date Noted  . Right hip pain 09/02/2019  . Toe pain, right 04/29/2019  . Chronic obstructive pulmonary disease ([place]) 01/01/2019  . Atrial fibrillation ([place]) 10/20/2017  . Acute bronchitis 09/10/2015  . Abdominal bruit 02/02/2015  . Health care maintenance 02/02/2015  . BMI 40.0-44.9, adult ([place]) 10/06/2014  . URI (upper respiratory infection) 10/18/2013  . Left shoulder pain 10/02/2013  . Dyspnea 10/20/2012  . Abnormal EKG 10/16/2012  . Tobacco abuse 10/16/2012  . Family history of coronary arteriosclerosis 10/16/2012  . Pure hypercholesterolemia 10/02/2012  . Essential hypertension, benign 10/02/2012  . Hyperglycemia 10/02/2012  . Anxiety 10/02/2012    Sherrilyn Rist, SPT 10/08/19, 10:55 AM  Everlean Alstrom. Graylon Good, PT, DPT 10/08/19, 11:03 AM  [facility] [address], [place], [address] Phone: [phone]   Fax:  [phone]  Name: Danielle Yates MRN:  [id number] Date of Birth: 1941/01/06

## 2019-10-10 ENCOUNTER — Other Ambulatory Visit: Payer: Self-pay

## 2019-10-10 ENCOUNTER — Encounter: Payer: Self-pay | Admitting: Physical Therapy

## 2019-10-10 ENCOUNTER — Ambulatory Visit: Payer: PPO | Admitting: Physical Therapy

## 2019-10-10 DIAGNOSIS — M25551 Pain in right hip: Secondary | ICD-10-CM

## 2019-10-10 DIAGNOSIS — M6281 Muscle weakness (generalized): Secondary | ICD-10-CM

## 2019-10-10 DIAGNOSIS — G8929 Other chronic pain: Secondary | ICD-10-CM

## 2019-10-10 NOTE — Therapy (Signed)
[facility] [address], [place], [address] Phone: [phone]   Fax:  [phone]  Physical Therapy Treatment  Patient Details  Name: MCKELL SCIASCIA MRN: [id number] Date of Birth: Dec 26, 1940 Referring Provider (PT):  Einar Pheasant, MD   Encounter Date: 10/10/2019  PT End of Session - 10/10/19 0956    Visit Number  8    Number of Visits  12    Date for PT Re-Evaluation  10/29/19    Authorization Type  [employer] reporting from 09/17/2019    Authorization Time Period  certification period: 09/17/2019- 10/29/2019    Authorization - Visit Number  8    Authorization - Number of Visits  10    PT Start Time  0950    PT Stop Time  1030    PT Time Calculation (min)  40 min    Activity Tolerance  Patient tolerated treatment well    Behavior During Therapy  [facility] for tasks assessed/performed       Past Medical History:  Diagnosis Date  . Allergy   . Aortic valve sclerosis    a. TTE 2013: EF 60-65%, no RWMA, Gr1DD, trivial MR, mildly dilated LA 43 mm; b. TTE 08/2017: EF 65-70%, mild LVH, aortic valve sclerosis without stenosis, mildly dilated LA 36 mm, RV size and sys fxn nl  . Arthropathy, unspecified, site unspecified   . Cataract   . Chronic rhinitis   . Disorder of bone and cartilage, unspecified   . Diverticulosis of colon (without mention of hemorrhage)   . Essential hypertension    a. refractory HTN; b. renal US 2013 negative for RAS  . External hemorrhoids without mention of complication   . Medication intolerance   . Obesity, unspecified   . Other abnormal glucose   . Panic disorder without agoraphobia   . Peripheral arterial disease ([place])    a.  Known bilateral common iliac disease-> medical therapy-asymptomatic.  [name] Persistent atrial fibrillation ([place])    a. diagnosed 10/19/17; b. CHADS2VASc 5 (HTN, age x 2, vascular disease, female)-->Eliquis  . Pure hypercholesterolemia   . Tobacco  use disorder   . Unspecified vitamin D deficiency     Past Surgical History:  Procedure Laterality Date  . APPENDECTOMY  1985  . CHOLECYSTECTOMY  1985   with open wound  . EYE SURGERY  2011   CATARACTS -  BOTH  . Removed spot on face     [name]  . TONSILLECTOMY AND ADENOIDECTOMY  1946  . VAGINAL HYSTERECTOMY  1982   fibroid and endometriosis; one ovary remaining    There were no vitals filed for this visit.  Subjective Assessment - 10/10/19 0955    Subjective  Patient reports 0/10 pain today and has been doing HEP at home. Patient states she didn't have muscle soreness after last session and has been feeling well overally.    Pertinent History  Patient is a 78 y.o. female who presents to outpatient physical therapy with a referral for medical diagnosis of R hip pain. This patient's chief complaints consist of constant R hip pain and lower back pain that has been gradually increasing in severity since March 2020, leading to the following functional deficits: difficulty with house working activities, prolonged sitting/standing/walking, quality of life, lifting, etc.. 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  1 hour    How long can you stand comfortably?  20-30 mines    How long can you walk comfortably?  15 min    Diagnostic tests  X-ray Lumbar: Number diffuse multilevel degenerative change with 9 mmanterolisthesis of L4 on L5. No acute bony abnormality identified.  Aortoiliac atherosclerotic vascular disease. 2019    Patient Stated Goals  Loss more weight; start a exercise routine; more active; Reduce the R hip pain    Currently in  Pain?  No/denies    Pain Onset  More than a month ago       TREATMENT: Therapeutic exercise:to centralize symptoms and improve ROM, strength, muscular endurance, and activity tolerance required for successful completion of functional activities. - Ambulation with SBA to improve muscle endurance and activity tolerance ( 3+ break + 4 ) mins. Cuing to improve posture and forms.  Ankle weight x 2 lb each LE - Side steppinghip abductiontwo sets20x3each side(bothsetswith theraband below the knee (red) - StandingB hip abductions 4x10 reps with therapy band red below knee.  - Seated shoulder external rotation with red/yellow therapy band. 2x10 reps. To improve core stability and TA activation.  -Education on HEPwith progressed 59mins in total of walking a day.  - Standing gluteal activation x 10 reps.  Cuing for proper form  Therapeutic activities: for functional strengthening and improved functional activity tolerance.  With red therapy band. - Half squat with cuing to block B knee flexion to encourage gluteal activation from sitting to standing phase.3x10 reps.Cuing at L knee to prevent hyper flexion during sitting phase - Step up to stairs with each LE leading x 20 reps each side. Ankle weight on R LE to improve muscle activation.  Cuing for proper form    HOME EXERCISE PROGRAM Access Code: [email or web address] URL: [email or web address] Date: 09/19/2019  Prepared by: Rosita Kea   Exercises Standing Hip Extension with Counter Support - 3 sets - 10 reps - 3s hold - 1x daily - 7x weekly Standing Gluteal Sets - 3 sets - 10 reps - 3s hold - 1x daily - 7x weekly Sit to Stand with Resistance Around Legs - 3 sets - 10 reps - 1x daily - 7x weekly -8x36min walking per day.   Patient tolerated treatment welland continue progress towards her goals. Patient demonstrated improved muscle activation pattern, forms and techniques with half squat exercises but  still requires moderated tactile cuing to encourage hip hinge motion. Patient also demonstrated improve activity tolerance with ambulation distance.  Patient demonstrated positive attitude towards her rehab session. Patient will continuebenefit from continued physical therapy to address remaining impairments and functional limitations to work towards stated goals and return to PLOF or maximal functional independence.     PT Education - 10/10/19 0956    Education Details  Exercise purpose/form. Self management techniques.    Person(s) Educated  Patient    Methods  Explanation;Demonstration;Tactile cues;Verbal cues    Comprehension  Verbalized understanding;Returned demonstration;Verbal cues required;Tactile cues required       PT Short Term Goals - 09/26/19 1039      PT SHORT TERM GOAL #1   Title  Be independent with initial home exercise program for self-management of symptoms.    Baseline  initial HEP provided at IE (09/17/2019);    Time  2    Period  Weeks    Status  Achieved  Target Date  10/01/19        PT Long Term Goals - 09/17/19 0951      PT LONG TERM GOAL #1   Title  Be independent with a long-term home exercise program for self-management of symptoms.    Baseline  initial HEP provided at IE (09/17/2019);    Time  6    Period  Weeks    Status  New    Target Date  10/29/19      PT LONG TERM GOAL #2   Title  Demonstrate improved FOTO score by 10 units to demonstrate improvement in overall condition and self-reported functional ability.    Baseline  FOTO = 50(09/17/2019)    Time  6    Period  Weeks    Status  New    Target Date  10/29/19      PT LONG TERM GOAL #3   Title  Reduce pain with functional activities to equal or less than 1/10 to allow patient to complete usual activities including ADLs, IADLs, and social engagement with less difficulty.    Baseline  4/10 (09/17/2019)    Time  6    Period  Weeks    Status  New    Target Date  10/29/19      PT  LONG TERM GOAL #4   Title  Complete community, work and/or recreational activities without limitation due to current condition.    Baseline  prolonged walking/sitting/standing; getting up and down stairs, sit <>standing; sleeping; lifting 20 bottled water(09/17/2019)    Time  6    Period  Weeks    Status  New    Target Date  10/29/19      PT LONG TERM GOAL #5   Title  Pt will increase strength of by at least 1/2 MMT grade in order to demonstrate improvement in strength and function.    Baseline  See objective notes(09/17/2019);    Time  6    Period  Weeks    Status  New    Target Date  10/29/19            Plan - 10/10/19 1002    Clinical Impression Statement  Patient tolerated treatment well and continue progress towards her goals. Patient demonstrated improved muscle activation pattern, forms and techniques with half squat exercises but still requires moderated tactile cuing to encourage hip hinge motion. Patient also demonstrated improve activity tolerance with ambulation distance.  Patient demonstrated positive attitude towards her rehab session. Patient will continue benefit from continued physical therapy to address remaining impairments and functional limitations to work towards stated goals and return to PLOF or maximal functional independence.    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 etc.; surgeries include appendectomy, cholecystectomy, vaginal hysterectomy    Examination-Activity Limitations  Bend;Caring for Others;Carry;Sleep;Lift;Sit;Stand;Bed Mobility;Stairs;Squat    Examination-Participation Restrictions  Church;Yard Work;Community Activity    Stability/Clinical Decision Making  Stable/Uncomplicated    Rehab Potential  Good    PT Frequency  2x / week    PT Duration  6 weeks    PT Treatment/Interventions  ADLs/Self Care Home  Management;Cryotherapy;Moist Heat;Stair training;Therapeutic activities;Therapeutic exercise;Gait training;Functional mobility training;Patient/family education;Manual techniques;Energy conservation;Joint Manipulations;Other (comment)   joint mobilization grades I-V   PT Next Visit Plan  Strengthening; 37min walk test; HEP update    PT Home Exercise Plan  Medbridge: [id number]    Consulted and  Agree with Plan of Care  Patient       Patient will benefit from skilled therapeutic intervention in order to improve the following deficits and impairments:  Abnormal gait, Difficulty walking, Increased muscle spasms, Impaired sensation, Impaired tone, Decreased activity tolerance, Decreased strength, Pain  Visit Diagnosis: Pain in right hip  Muscle weakness (generalized)  Chronic right-sided low back pain with right-sided sciatica     Problem List Patient Active Problem List   Diagnosis Date Noted  . Right hip pain 09/02/2019  . Toe pain, right 04/29/2019  . Chronic obstructive pulmonary disease ([place]) 01/01/2019  . Atrial fibrillation ([place]) 10/20/2017  . Acute bronchitis 09/10/2015  . Abdominal bruit 02/02/2015  . Health care maintenance 02/02/2015  . BMI 40.0-44.9, adult ([place]) 10/06/2014  . URI (upper respiratory infection) 10/18/2013  . Left shoulder pain 10/02/2013  . Dyspnea 10/20/2012  . Abnormal EKG 10/16/2012  . Tobacco abuse 10/16/2012  . Family history of coronary arteriosclerosis 10/16/2012  . Pure hypercholesterolemia 10/02/2012  . Essential hypertension, benign 10/02/2012  . Hyperglycemia 10/02/2012  . Anxiety 10/02/2012    .Sherrilyn Rist, SPT 10/10/19, 10:52 AM  Everlean Alstrom. Graylon Good, PT, DPT 10/10/19, 11:11 AM  [facility] [address] [place], [address] Phone: [phone]   Fax:  [phone]  Name: FATEMAH MEHROTRA MRN: [id number] Date of Birth: 1941/07/15

## 2019-10-11 ENCOUNTER — Encounter: Payer: Self-pay | Admitting: Internal Medicine

## 2019-10-15 ENCOUNTER — Other Ambulatory Visit: Payer: Self-pay

## 2019-10-15 ENCOUNTER — Encounter: Payer: Self-pay | Admitting: Physical Therapy

## 2019-10-15 ENCOUNTER — Ambulatory Visit: Payer: PPO | Admitting: Physical Therapy

## 2019-10-15 DIAGNOSIS — G8929 Other chronic pain: Secondary | ICD-10-CM

## 2019-10-15 DIAGNOSIS — M25551 Pain in right hip: Secondary | ICD-10-CM

## 2019-10-15 DIAGNOSIS — M6281 Muscle weakness (generalized): Secondary | ICD-10-CM

## 2019-10-15 NOTE — Therapy (Signed)
[facility] [address], [place], [address] Phone: [phone]   Fax:  [phone]  Physical Therapy Treatment  Patient Details  Name: Danielle Yates MRN: [id number] Date of Birth: 01/18/1941 Referring Provider (PT):  Einar Pheasant, MD   Encounter Date: 10/15/2019  PT End of Session - 10/15/19 1018    Visit Number  9    Number of Visits  12    Date for PT Re-Evaluation  10/29/19    Authorization Type  [employer] reporting from 09/17/2019    Authorization Time Period  certification period: 09/17/2019- 10/29/2019    Authorization - Visit Number  9    Authorization - Number of Visits  10    PT Start Time  0946    PT Stop Time  1030    PT Time Calculation (min)  44 min    Activity Tolerance  Patient tolerated treatment well    Behavior During Therapy  [facility] for tasks assessed/performed       Past Medical History:  Diagnosis Date  . Allergy   . Aortic valve sclerosis    a. TTE 2013: EF 60-65%, no RWMA, Gr1DD, trivial MR, mildly dilated LA 43 mm; b. TTE 08/2017: EF 65-70%, mild LVH, aortic valve sclerosis without stenosis, mildly dilated LA 36 mm, RV size and sys fxn nl  . Arthropathy, unspecified, site unspecified   . Cataract   . Chronic rhinitis   . Disorder of bone and cartilage, unspecified   . Diverticulosis of colon (without mention of hemorrhage)   . Essential hypertension    a. refractory HTN; b. renal US 2013 negative for RAS  . External hemorrhoids without mention of complication   . Medication intolerance   . Obesity, unspecified   . Other abnormal glucose   . Panic disorder without agoraphobia   . Peripheral arterial disease ([place])    a.  Known bilateral common iliac disease-> medical therapy-asymptomatic.  [name] Persistent atrial fibrillation ([place])    a. diagnosed 10/19/17; b. CHADS2VASc 5 (HTN, age x 2, vascular disease, female)-->Eliquis  . Pure hypercholesterolemia   . Tobacco  use disorder   . Unspecified vitamin D deficiency     Past Surgical History:  Procedure Laterality Date  . APPENDECTOMY  1985  . CHOLECYSTECTOMY  1985   with open wound  . EYE SURGERY  2011   CATARACTS -  BOTH  . Removed spot on face     [name]  . TONSILLECTOMY AND ADENOIDECTOMY  1946  . VAGINAL HYSTERECTOMY  1982   fibroid and endometriosis; one ovary remaining    There were no vitals filed for this visit.  Subjective Assessment - 10/15/19 0957    Subjective  Patient report no pain at her back today and has been doing her HEP occationally and walking program for 15-20 mins now    Pertinent History  Patient is a 78 y.o. female who presents to outpatient physical therapy with a referral for medical diagnosis of R hip pain. This patient's chief complaints consist of constant R hip pain and lower back pain that has been gradually increasing in severity since March 2020, leading to the following functional deficits: difficulty with house working activities, prolonged sitting/standing/walking, quality of life, lifting, etc.. 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  1 hour    How long can you stand comfortably?  20-30 mines    How long can you walk comfortably?  15 min    Diagnostic tests  X-ray Lumbar: Number diffuse multilevel degenerative change with 9 mmanterolisthesis of L4 on L5. No acute bony abnormality identified.  Aortoiliac atherosclerotic vascular disease. 2019    Patient Stated Goals  Loss more weight; start a exercise routine; more active; Reduce the R hip pain    Currently in Pain?  No/denies    Pain Onset  More than a  month ago       TREATMENT: Therapeutic exercise:to centralize symptoms and improve ROM, strength, muscular endurance, and activity tolerance required for successful completion of functional activities. - Ambulation with SBA to improve muscle endurance and activity tolerance 6 mins with 1 standing break for 10s. Cuing to improve posture and forms. Ankle weight x 2 lb each LE - Side steppinghip abductiontwo sets20x3each side - Standing/seated gluteal activation x 10 reps each direction - StandingB hip abductions 4x10 reps with therapy band red below knee. -Education on HEPwith progress to 20-59mins in total of walking a day. Cuing for proper form and length    Therapeutic activities: for functional strengthening and improved functional activity tolerance. - Half squat with cuing to block B knee flexion to encourage gluteal activation from sitting to standing phase.2x10 reps. - Step up to stairs with each LE leading 2 x 20 reps each side. Ankle weight on R and L LE to improve muscle activation.  Cuing for proper form, anatomy, and muscle activation pattern.    HOME EXERCISE PROGRAM Access Code: [email or web address] URL: [email or web address] Date: 09/19/2019  Prepared by: Rosita Kea   Exercises Standing Hip Extension with Counter Support - 3 sets - 10 reps - 3s hold - 1x daily - 7x weekly Standing Gluteal Sets - 3 sets - 10 reps - 3s hold - 1x daily - 7x weekly Sit to Stand with Resistance Around Legs - 3 sets - 10 reps - 1x daily - 7x weekly -8x76min walking per day.   Patient tolerated treatment welland continue progress towards her goals.Patient demonstrated improved good muscle activation during functional activities but still requires cuing to maintain pelvic stability with activation of gluteal medius. Patient also demonstrated improved activity tolerance with walking program (33min). From the above presentation, patientwill continuebenefit  from continued physical therapy to address remaining impairments and functional limitations to work towards stated goals and return to PLOF or maximal functional independence.    PT Education - 10/15/19 1004    Education Details  Exercise purpose/form. Self management techniques.    Person(s) Educated  Patient    Methods  Explanation;Demonstration;Tactile cues;Verbal cues    Comprehension  Verbalized understanding;Returned demonstration;Verbal cues required;Tactile cues required       PT Short Term Goals - 09/26/19 1039      PT SHORT TERM GOAL #1   Title  Be independent with initial home exercise program for self-management of symptoms.    Baseline  initial HEP provided at IE (09/17/2019);    Time  2    Period  Weeks    Status  Achieved    Target Date  10/01/19        PT Long Term Goals - 09/17/19 0951      PT LONG TERM GOAL #1   Title  Be independent with a long-term home exercise program for self-management  of symptoms.    Baseline  initial HEP provided at IE (09/17/2019);    Time  6    Period  Weeks    Status  New    Target Date  10/29/19      PT LONG TERM GOAL #2   Title  Demonstrate improved FOTO score by 10 units to demonstrate improvement in overall condition and self-reported functional ability.    Baseline  FOTO = 50(09/17/2019)    Time  6    Period  Weeks    Status  New    Target Date  10/29/19      PT LONG TERM GOAL #3   Title  Reduce pain with functional activities to equal or less than 1/10 to allow patient to complete usual activities including ADLs, IADLs, and social engagement with less difficulty.    Baseline  4/10 (09/17/2019)    Time  6    Period  Weeks    Status  New    Target Date  10/29/19      PT LONG TERM GOAL #4   Title  Complete community, work and/or recreational activities without limitation due to current condition.    Baseline  prolonged walking/sitting/standing; getting up and down stairs, sit <>standing; sleeping; lifting 20  bottled water(09/17/2019)    Time  6    Period  Weeks    Status  New    Target Date  10/29/19      PT LONG TERM GOAL #5   Title  Pt will increase strength of by at least 1/2 MMT grade in order to demonstrate improvement in strength and function.    Baseline  See objective notes(09/17/2019);    Time  6    Period  Weeks    Status  New    Target Date  10/29/19            Plan - 10/15/19 1146    Clinical Impression Statement  Patient tolerated treatment well and continue progress towards her goals. Patient demonstrated improved good muscle activation during functional activities but still requires cuing to maintain pelvic stability with activation of gluteal medius. Patient also demonstrated improved activity tolerance with walking program (60min). From the above presentation, patient will continue benefit from continued physical therapy to address remaining impairments and functional limitations to work towards stated goals and return to PLOF or maximal functional independence.    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 etc.; surgeries include appendectomy, cholecystectomy, vaginal hysterectomy    Examination-Activity Limitations  Bend;Caring for Others;Carry;Sleep;Lift;Sit;Stand;Bed Mobility;Stairs;Squat    Examination-Participation Restrictions  Church;Yard Work;Community Activity    Stability/Clinical Decision Making  Stable/Uncomplicated    Rehab Potential  Good    PT Frequency  2x / week    PT Duration  6 weeks    PT Treatment/Interventions  ADLs/Self Care Home Management;Cryotherapy;Moist Heat;Stair training;Therapeutic activities;Therapeutic exercise;Gait training;Functional mobility training;Patient/family education;Manual techniques;Energy conservation;Joint Manipulations;Other (comment)   joint mobilization grades I-V   PT Next Visit  Plan  Strengthening; 62min walk test; HEP update    PT Home Exercise Plan  Medbridge: [id number]    Consulted and Agree with Plan of Care  Patient       Patient will benefit from skilled therapeutic intervention in order to improve the following deficits and impairments:  Abnormal gait, Difficulty walking, Increased muscle spasms, Impaired sensation, Impaired tone, Decreased activity tolerance, Decreased strength, Pain  Visit Diagnosis: Pain in right hip  Muscle weakness (generalized)  Chronic right-sided low back pain with right-sided sciatica     Problem List Patient Active Problem List   Diagnosis Date Noted  . Right hip pain 09/02/2019  . Toe pain, right 04/29/2019  . Chronic obstructive pulmonary disease ([place]) 01/01/2019  . Atrial fibrillation ([place]) 10/20/2017  . Acute bronchitis 09/10/2015  . Abdominal bruit 02/02/2015  . Health care maintenance 02/02/2015  . BMI 40.0-44.9, adult ([place]) 10/06/2014  . URI (upper respiratory infection) 10/18/2013  . Left shoulder pain 10/02/2013  . Dyspnea 10/20/2012  . Abnormal EKG 10/16/2012  . Tobacco abuse 10/16/2012  . Family history of coronary arteriosclerosis 10/16/2012  . Pure hypercholesterolemia 10/02/2012  . Essential hypertension, benign 10/02/2012  . Hyperglycemia 10/02/2012  . Anxiety 10/02/2012    Sherrilyn Rist, SPT 10/15/19, 2:37 PM  Everlean Alstrom. Graylon Good, PT, DPT 10/15/19, 2:37 PM  [facility] [address] [place], [address] Phone: [phone]   Fax:  [phone]  Name: Danielle Yates MRN: [id number] Date of Birth: 1940-12-07

## 2019-10-17 ENCOUNTER — Ambulatory Visit: Payer: PPO | Admitting: Physical Therapy

## 2019-10-17 ENCOUNTER — Other Ambulatory Visit: Payer: Self-pay

## 2019-10-17 ENCOUNTER — Encounter: Payer: Self-pay | Admitting: Physical Therapy

## 2019-10-17 DIAGNOSIS — M25551 Pain in right hip: Secondary | ICD-10-CM

## 2019-10-17 DIAGNOSIS — G8929 Other chronic pain: Secondary | ICD-10-CM

## 2019-10-17 DIAGNOSIS — M5441 Lumbago with sciatica, right side: Secondary | ICD-10-CM

## 2019-10-17 DIAGNOSIS — M6281 Muscle weakness (generalized): Secondary | ICD-10-CM

## 2019-10-17 NOTE — Therapy (Signed)
[facility] [address], [place], [address] Phone: [phone]   Fax:  [phone]  Physical Therapy Treatment/ Progress notes   Reporting period 09/17/2019 - 10/17/2019  Patient Details  Name: Danielle Yates MRN: [id number] Date of Birth: 1941/05/18 Referring Provider (PT):  Einar Pheasant, MD   Encounter Date: 10/17/2019  PT End of Session - 10/17/19 0949    Visit Number  10    Number of Visits  12    Date for PT Re-Evaluation  10/29/19    Authorization Type  [employer] reporting from 09/17/2019    Authorization Time Period  certification period: 09/17/2019- 10/29/2019    Authorization - Visit Number  0    Authorization - Number of Visits  10    PT Start Time  0949    PT Stop Time  1030    PT Time Calculation (min)  41 min    Activity Tolerance  Patient tolerated treatment well    Behavior During Therapy  [facility] for tasks assessed/performed       Past Medical History:  Diagnosis Date  . Allergy   . Aortic valve sclerosis    a. TTE 2013: EF 60-65%, no RWMA, Gr1DD, trivial MR, mildly dilated LA 43 mm; b. TTE 08/2017: EF 65-70%, mild LVH, aortic valve sclerosis without stenosis, mildly dilated LA 36 mm, RV size and sys fxn nl  . Arthropathy, unspecified, site unspecified   . Cataract   . Chronic rhinitis   . Disorder of bone and cartilage, unspecified   . Diverticulosis of colon (without mention of hemorrhage)   . Essential hypertension    a. refractory HTN; b. renal US 2013 negative for RAS  . External hemorrhoids without mention of complication   . Medication intolerance   . Obesity, unspecified   . Other abnormal glucose   . Panic disorder without agoraphobia   . Peripheral arterial disease ([place])    a.  Known bilateral common iliac disease-> medical therapy-asymptomatic.  [name] Persistent atrial fibrillation ([place])    a. diagnosed 10/19/17; b. CHADS2VASc 5 (HTN, age x 2, vascular  disease, female)-->Eliquis  . Pure hypercholesterolemia   . Tobacco use disorder   . Unspecified vitamin D deficiency     Past Surgical History:  Procedure Laterality Date  . APPENDECTOMY  1985  . CHOLECYSTECTOMY  1985   with open wound  . EYE SURGERY  2011   CATARACTS -  BOTH  . Removed spot on face     [name]  . TONSILLECTOMY AND ADENOIDECTOMY  1946  . VAGINAL HYSTERECTOMY  1982   fibroid and endometriosis; one ovary remaining    There were no vitals filed for this visit.  Subjective Assessment - 10/17/19 0949    Subjective  Patient report no pain at her back today and has been doing her HEP occationally and walking program for 15-20 mins now    Pertinent History  Patient is a 78 y.o. female who presents to outpatient physical therapy with a referral for medical diagnosis of R hip pain. This patient's chief complaints consist of constant R hip pain and lower back pain that has been gradually increasing in severity since March 2020, leading to the following functional deficits: difficulty with house working activities, prolonged sitting/standing/walking, quality of life, lifting, etc.. 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  1 hour    How long can you stand comfortably?  20-30 mines    How long can you walk comfortably?  15 min    Diagnostic tests  X-ray Lumbar: Number diffuse multilevel degenerative change with 9 mmanterolisthesis of L4 on L5. No acute bony abnormality identified.  Aortoiliac atherosclerotic vascular disease. 2019    Patient Stated Goals  Loss more weight; start a exercise routine; more active; Reduce the R hip  pain    Currently in Pain?  No/denies    Pain Onset  More than a month ago       [facility] PT Assessment - 10/17/19 0001      Assessment   Medical Diagnosis  Right hip pain    Referring Provider (PT)   Einar Pheasant, MD    Hand Dominance  Right    Next MD Visit  --   12/2019   Prior Therapy  None      Precautions   Precautions  None      Restrictions   Weight Bearing Restrictions  No      Balance Screen   Has the patient fallen in the past 6 months  No      [place] residence    Living Arrangements  Alone    Type of [place] to enter    Entrance Stairs-Number of Steps  5    Entrance Stairs-Rails  Right      Prior Function   Level of [place]  Retired      Associate Professor   Overall Cognitive Status  Within Functional Limits for tasks assessed      Observation/Other Assessments   Focus on Therapeutic Outcomes (FOTO)   65      Functional Gait  Assessment   Gait Level Surface  --    Change in Gait Speed  --    Gait with Horizontal Head Turns  --    Gait with Vertical Head Turns  --    Gait and Pivot Turn  --    Step Over Obstacle  --    Gait with Narrow Base of Support  --    Gait with Eyes Closed  --    Ambulating Backwards  --    Steps  --    Total Score  --      OBJECTIVE  Patient Specific Functional Scale:(0 unable to perform; 10 no difficult)  Stairs: 10  Picking up the water: 10  Sleep on the R side:10  Strength (out of 5) R/L 4/4Hip flexion 4+/4+ Hip abduction. Seated 4+/4+ Hip adduction. Seated *indicate pain.   Functional test:  Stairs: going up and down with alternating gait pattern.  5 x STS:  First trial: 14s with hand over chest and extra time spent on adjust knee placement during standing.  Second trial: 13s with hand over chest.    Outcome measure:  FOTO: 65   Objective measurements completed on examination: See above findings.     TREATMENT:  Therapeutic exercise:to centralize symptoms and improve ROM, strength, muscular endurance, and activity tolerance required for successful completion of functional activities.  - Ambulation with SBA to improve muscle endurance and activity tolerance (2.75 + 2.5) mins with 1 seated break for 2 mins. Cuing  to improve posture and forms.Limited activity tolerance due to humidity of the weather. - Objective assessed (see above data) - Sit to stand from chair x 10 reps  - Cuing provide for forms and techniques for exercises performed during the session. -Patient was educated on diagnosis, anatomy and pathology involved, prognosis, role of PT. Pt was educated on and agreed to plan of care. - HEP updated for walking programs to include a daily log with minimal 3 mins per time.    Patient has attended 10 skilled physical therapy treatment sessions this episode of care and overall presents with good progress towards stated goals. Subjectively, patient reports feeling increased activity tolerance, self care pain control and pain reduction. She can ambuate 2-3 mins per time currently. Objectively, patient demonstrates good progression with increased activity tolerance (ambulation distance; stairs), outcome measure (FOTO), self-care for pain control (pain reduction); and LE power and strength(5xSTS). Patient has been showing good gain in the previous PT treatment. However, patient continues to present with strength and activity tolerance impairments that are limiting ability to complete her usual ADLs, IADLs, community participation, manage her home, bend, lift, and ambulate community distances. Patient will benefit from continued skilled physical therapy intervention to address current body structure impairments and activity limitations to improve function and work towards goals set in current POC in order to return to prior level of function or maximal functional improvement.    PT Education -  10/17/19 0949    Education Details  Exercise purpose/form. Self management techniques.    Person(s) Educated  Patient    Methods  Explanation;Demonstration;Tactile cues;Verbal cues    Comprehension  Verbalized understanding;Returned demonstration;Verbal cues required;Tactile cues required       PT Short Term Goals - 09/26/19 1039      PT SHORT TERM GOAL #1   Title  Be independent with initial home exercise program for self-management of symptoms.    Baseline  initial HEP provided at IE (09/17/2019);    Time  2    Period  Weeks    Status  Achieved    Target Date  10/01/19        PT Long Term Goals - 10/17/19 0953      PT LONG TERM GOAL #1   Title  Be independent with a long-term home exercise program for self-management of symptoms.    Baseline  initial HEP provided at IE (09/17/2019); continue updating HEP (10/17/2019)    Time  6    Period  Weeks    Status  Partially Met    Target Date  10/29/19      PT LONG TERM GOAL #2   Title  Demonstrate improved FOTO score by 10 units to demonstrate improvement in overall condition and self-reported functional ability.    Baseline  FOTO = 50(09/17/2019) FOTO = 65 (10/17/2019)    Time  6    Period  Weeks    Status  Achieved    Target Date  10/29/19      PT LONG TERM GOAL #3   Title  Reduce pain with functional activities to equal or less than 1/10 to allow patient to complete usual activities including ADLs, IADLs, and social engagement with less difficulty.    Baseline  4/10 (09/17/2019); 0/10 with proper sitting positing (10/17/2019)    Time  6    Period  Weeks    Status  Achieved    Target Date  10/29/19      PT LONG TERM GOAL #4  Title  Complete community, work and/or recreational activities without limitation due to current condition.    Baseline  prolonged walking/sitting/standing; getting up and down stairs, sit <>standing; sleeping; lifting 20 bottled water(09/17/2019); Able to go up and down stair with alternating LEs,  no pain from sitting ot standing, patient has modified buying 6 water bottles a time to self-control of pain. Patient has been able to sleep better and didn't wake as much as she used to do (10/17/2019)    Time  6    Period  Weeks    Status  Partially Met    Target Date  10/29/19      PT LONG TERM GOAL #5   Title  Pt will increase strength of by at least 1/2 MMT grade in order to demonstrate improvement in strength and function.    Baseline  See objective notes(09/17/2019); See objective notes on (10/17/2019)    Time  6    Period  Weeks    Status  Achieved    Target Date  10/29/19            Plan - 10/17/19 1104    Clinical Impression Statement  Patient has attended 10 skilled physical therapy treatment sessions this episode of care and overall presents with good progress towards stated goals. Subjectively, patient reports feeling increased activity tolerance, self care pain control and pain reduction. She can ambuate 2-3 mins per time currently. Objectively, patient demonstrates good progression with increased activity tolerance (ambulation distance; stairs), outcome measure (FOTO), self-care for pain control (pain reduction); and LE power and strength(5xSTS). Patient has been showing good gain in the previous PT treatment. However, patient continues to present with strength and activity tolerance impairments that are limiting ability to complete her usual ADLs, IADLs, community participation, manage her home, bend, lift, and ambulate community distances. Patient will benefit from continued skilled physical therapy intervention to address current body structure impairments and activity limitations to improve function and work towards goals set in current POC in order to return to prior level of function or maximal functional improvement.    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 etc.; surgeries include appendectomy, cholecystectomy, vaginal hysterectomy    Examination-Activity Limitations  Bend;Caring for Others;Carry;Sleep;Lift;Sit;Stand;Bed Mobility;Stairs;Squat    Examination-Participation Restrictions  Church;Yard Work;Community Activity    Stability/Clinical Decision Making  Stable/Uncomplicated    Rehab Potential  Good    PT Frequency  2x / week    PT Duration  6 weeks    PT Treatment/Interventions  ADLs/Self Care Home Management;Cryotherapy;Moist Heat;Stair training;Therapeutic activities;Therapeutic exercise;Gait training;Functional mobility training;Patient/family education;Manual techniques;Energy conservation;Joint Manipulations;Other (comment)   joint mobilization grades I-V   PT Next Visit Plan  Strengthening; 18mn walk test; HEP update    PT Home Exercise Plan  [employer]: [email or web address]   Consulted and Agree with Plan of Care  Patient       Patient will benefit from skilled therapeutic intervention in order to improve the following deficits and impairments:  Abnormal gait, Difficulty walking, Increased muscle spasms, Impaired sensation, Impaired tone, Decreased activity tolerance, Decreased strength, Pain  Visit Diagnosis: Pain in right hip  Muscle weakness (generalized)  Chronic right-sided low back pain with right-sided sciatica     Problem List Patient Active Problem List   Diagnosis Date Noted  . Right hip pain 09/02/2019  . Toe pain, right 04/29/2019  . Chronic obstructive pulmonary disease (HCC)  01/01/2019  . Atrial fibrillation ([place]) 10/20/2017  . Acute bronchitis 09/10/2015  . Abdominal bruit 02/02/2015  . Health care maintenance 02/02/2015  . BMI 40.0-44.9, adult ([place]) 10/06/2014  . URI (upper respiratory infection) 10/18/2013  . Left shoulder pain 10/02/2013  . Dyspnea 10/20/2012  . Abnormal EKG 10/16/2012  . Tobacco abuse 10/16/2012  . Family history of coronary arteriosclerosis 10/16/2012   . Pure hypercholesterolemia 10/02/2012  . Essential hypertension, benign 10/02/2012  . Hyperglycemia 10/02/2012  . Anxiety 10/02/2012    Sherrilyn Rist, SPT 10/17/19, 1:05 PM   Everlean Alstrom. Graylon Good, PT, DPT 10/17/19, 1:05 PM   [facility] [address], [place], [address] Phone: [phone]   Fax:  [phone]  Name: Danielle Yates MRN: [id number] Date of Birth: September 05, 1941

## 2019-10-21 ENCOUNTER — Other Ambulatory Visit: Payer: Self-pay | Admitting: Physician Assistant

## 2019-10-22 ENCOUNTER — Other Ambulatory Visit: Payer: Self-pay | Admitting: Physician Assistant

## 2019-10-22 ENCOUNTER — Ambulatory Visit: Payer: PPO | Admitting: Physical Therapy

## 2019-10-22 ENCOUNTER — Other Ambulatory Visit: Payer: Self-pay

## 2019-10-22 ENCOUNTER — Encounter: Payer: Self-pay | Admitting: Physical Therapy

## 2019-10-22 DIAGNOSIS — M25551 Pain in right hip: Secondary | ICD-10-CM | POA: Diagnosis not present

## 2019-10-22 DIAGNOSIS — M6281 Muscle weakness (generalized): Secondary | ICD-10-CM

## 2019-10-22 DIAGNOSIS — G8929 Other chronic pain: Secondary | ICD-10-CM

## 2019-10-22 NOTE — Therapy (Signed)
[facility] [address] [place], [address] Phone: [phone]   Fax:  [phone]  Physical Therapy Treatment/ Discharge notes  Reporting from 09/17/2019 to 10/22/2019  Patient Details  Name: PRESLEE [name] MRN: [id number] Date of Birth: 1941-11-05 Referring Provider (PT):  Einar Pheasant, MD   Encounter Date: 10/22/2019  PT End of Session - 10/22/19 0959    Visit Number  11    Number of Visits  12    Date for PT Re-Evaluation  10/29/19    Authorization Type  [employer] reporting from 09/17/2019    Authorization Time Period  certification period: 09/17/2019- 10/29/2019    Authorization - Visit Number  1    Authorization - Number of Visits  10    PT Start Time  0948    PT Stop Time  1030    PT Time Calculation (min)  42 min    Activity Tolerance  Patient tolerated treatment well    Behavior During Therapy  [facility] for tasks assessed/performed       Past Medical History:  Diagnosis Date  . Allergy   . Aortic valve sclerosis    a. TTE 2013: EF 60-65%, no RWMA, Gr1DD, trivial MR, mildly dilated LA 43 mm; b. TTE 08/2017: EF 65-70%, mild LVH, aortic valve sclerosis without stenosis, mildly dilated LA 36 mm, RV size and sys fxn nl  . Arthropathy, unspecified, site unspecified   . Cataract   . Chronic rhinitis   . Disorder of bone and cartilage, unspecified   . Diverticulosis of colon (without mention of hemorrhage)   . Essential hypertension    a. refractory HTN; b. renal US 2013 negative for RAS  . External hemorrhoids without mention of complication   . Medication intolerance   . Obesity, unspecified   . Other abnormal glucose   . Panic disorder without agoraphobia   . Peripheral arterial disease ([place])    a.  Known bilateral common iliac disease-> medical therapy-asymptomatic.  [name] Persistent atrial fibrillation ([place])    a. diagnosed 10/19/17; b. CHADS2VASc 5 (HTN, age x 2, vascular  disease, female)-->Eliquis  . Pure hypercholesterolemia   . Tobacco use disorder   . Unspecified vitamin D deficiency     Past Surgical History:  Procedure Laterality Date  . APPENDECTOMY  1985  . CHOLECYSTECTOMY  1985   with open wound  . EYE SURGERY  2011   CATARACTS -  BOTH  . Removed spot on face     [name]  . TONSILLECTOMY AND ADENOIDECTOMY  1946  . VAGINAL HYSTERECTOMY  1982   fibroid and endometriosis; one ovary remaining    There were no vitals filed for this visit.  Subjective Assessment - 10/22/19 0954    Subjective  Patient reports no pain at her back today and has been able to sleep well on both side. Patietn has been walking 20 mins daily at home.    Pertinent History  Patient is a 78 y.o. female who presents to outpatient physical therapy with a referral for medical diagnosis of R hip pain. This patient's chief complaints consist of constant R hip pain and lower back pain that has been gradually increasing in severity since March 2020, leading to the following functional deficits: difficulty with house working activities, prolonged sitting/standing/walking, quality of life, lifting, etc.. 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  1 hour    How long can you stand comfortably?  20-30 mines    How long can you walk comfortably?  15 min    Diagnostic tests  X-ray Lumbar: Number diffuse multilevel degenerative change with 9 mmanterolisthesis of L4 on L5. No acute bony abnormality identified.  Aortoiliac atherosclerotic vascular disease. 2019    Patient Stated Goals  Loss more weight; start a exercise routine; more active;  Reduce the R hip pain    Currently in Pain?  No/denies    Pain Onset  More than a month ago          TREATMENT: Therapeutic exercise:to centralize symptoms and improve ROM, strength, muscular endurance, and activity tolerance required for successful completion of functional activities. - Ambulation with SBA to improve muscle endurance and activity tolerance 27mns with 1 standing break for 30s. Cuing to improve posture and forms. - Side steppinghip abductiontwo sets20x3each side - StandingB hip abductions 4x10 reps with therapy band red below knee. - Standing 2 mins high knee marching. With One UE support for balance.  - Seated bilateral knee extension x 2 mins. -Education on HEPwith progress to 20-386msin totalof walking a day. Cuing for proper forms and exercise frequency.    Therapeutic activities: for functional strengthening and improved functional activity tolerance. - Half squat with cuing to block B knee flexion to encourage gluteal activation from sitting to standing phase.with red therapy band 3x10 reps. - Step up to stairs with each LE leading 5x4 steps. Cuing for proper form, anatomy, and muscle activation pattern.     HOME EXERCISE PROGRAM Access Code: [email or web address]: [email or web address] Date: 09/19/2019  Prepared by: SaRosita Kea HOME EXERCISE PROGRAM Access Code: [email or web address]: [email or web address]  Date: 10/22/2019  Prepared by: SaRosita Kea Exercises Seated Hip Abduction with Resistance - 3 sets - 10 reps Side Stepping with Resistance at Thighs and Counter Support - 3 sets - 10 reps Sit to Stand with Resistance Around Legs - 3 sets - 10 reps Standing Marching - 44m64m hold -8x44mi71malking per day.  Patient attended 11 therapy sessions. Patient made great progress with her back pain, back pain management and functional mobility related to back pain. Patient presented ability to teach back of  her long term HEP and demonstrated good forms and techniques. Education provided on her progress and POC. Patient has met or nearly met her goals and now will be discharged and long term HEP provided to maintain the gain from therapy.    PT Education - 10/22/19 0959    Education Details  Exercise purpose/form. Self management techniques.    Person(s) Educated  Patient    Methods  Explanation;Demonstration;Tactile cues;Verbal cues    Comprehension  Verbalized understanding;Returned demonstration;Verbal cues required;Tactile cues required       PT Short Term Goals - 09/26/19 1039      PT SHORT TERM GOAL #1   Title  Be independent with initial home exercise program for self-management of symptoms.    Baseline  initial HEP provided at IE (09/17/2019);    Time  2    Period  Weeks    Status  Achieved    Target Date  10/01/19        PT Long Term Goals - 10/22/19 1036      PT LONG TERM GOAL #1  Title  Be independent with a long-term home exercise program for self-management of symptoms.    Baseline  initial HEP provided at IE (09/17/2019); continue updating HEP (10/17/2019); long term HEP 10/22/2019    Time  6    Period  Weeks    Status  Achieved    Target Date  10/29/19      PT LONG TERM GOAL #2   Title  Demonstrate improved FOTO score by 10 units to demonstrate improvement in overall condition and self-reported functional ability.    Baseline  FOTO = 50(09/17/2019) FOTO = 65 (10/17/2019)    Time  6    Period  Weeks    Status  Achieved    Target Date  10/29/19      PT LONG TERM GOAL #3   Title  Reduce pain with functional activities to equal or less than 1/10 to allow patient to complete usual activities including ADLs, IADLs, and social engagement with less difficulty.    Baseline  4/10 (09/17/2019); 0/10 with proper sitting positing (10/17/2019)    Time  6    Period  Weeks    Status  Achieved    Target Date  10/29/19      PT LONG TERM GOAL #4   Title  Complete  community, work and/or recreational activities without limitation due to current condition.    Baseline  prolonged walking/sitting/standing; getting up and down stairs, sit <>standing; sleeping; lifting 20 bottled water(09/17/2019); Able to go up and down stair with alternating LEs, no pain from sitting ot standing, patient has modified buying 6 water bottles a time to self-control of pain. Patient has been able to sleep better and didn't wake as much as she used to do (10/17/2019)    Time  6    Period  Weeks    Status  Partially Met    Target Date  10/29/19      PT LONG TERM GOAL #5   Title  Pt will increase strength of by at least 1/2 MMT grade in order to demonstrate improvement in strength and function.    Baseline  See objective notes(09/17/2019); See objective notes on (10/17/2019)    Time  6    Period  Weeks    Status  Achieved    Target Date  10/29/19            Plan - 10/22/19 1003    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 etc.; surgeries include appendectomy, cholecystectomy, vaginal hysterectomy    Examination-Activity Limitations  Bend;Caring for Others;Carry;Sleep;Lift;Sit;Stand;Bed Mobility;Stairs;Squat    Examination-Participation Restrictions  Church;Yard Work;Community Activity    Stability/Clinical Decision Making  Stable/Uncomplicated    Rehab Potential  Good    PT Frequency  2x / week    PT Duration  6 weeks    PT Treatment/Interventions  ADLs/Self Care Home Management;Cryotherapy;Moist Heat;Stair training;Therapeutic activities;Therapeutic exercise;Gait training;Functional mobility training;Patient/family education;Manual techniques;Energy conservation;Joint Manipulations;Other (comment)   joint mobilization grades I-V   PT Next Visit Plan  Strengthening; 68mn walk test; HEP update    PT Home Exercise Plan  Medbridge:  [email or web address]   Consulted and Agree with Plan of Care  Patient       Patient will benefit from skilled therapeutic intervention in order to improve the following deficits and impairments:  Abnormal gait, Difficulty walking, Increased muscle spasms, Impaired sensation, Impaired tone, Decreased activity tolerance, Decreased strength,  Pain  Visit Diagnosis: Pain in right hip  Muscle weakness (generalized)  Chronic right-sided low back pain with right-sided sciatica     Problem List Patient Active Problem List   Diagnosis Date Noted  . Right hip pain 09/02/2019  . Toe pain, right 04/29/2019  . Chronic obstructive pulmonary disease ([place]) 01/01/2019  . Atrial fibrillation ([place]) 10/20/2017  . Acute bronchitis 09/10/2015  . Abdominal bruit 02/02/2015  . Health care maintenance 02/02/2015  . BMI 40.0-44.9, adult ([place]) 10/06/2014  . URI (upper respiratory infection) 10/18/2013  . Left shoulder pain 10/02/2013  . Dyspnea 10/20/2012  . Abnormal EKG 10/16/2012  . Tobacco abuse 10/16/2012  . Family history of coronary arteriosclerosis 10/16/2012  . Pure hypercholesterolemia 10/02/2012  . Essential hypertension, benign 10/02/2012  . Hyperglycemia 10/02/2012  . Anxiety 10/02/2012    Sherrilyn Rist, SPT 10/22/19, 10:37 AM  Everlean Alstrom. Graylon Good, PT, DPT 10/22/19, 11:16 AM    [facility] [address] [place], [address] Phone: [phone]   Fax:  [phone]  Name: TRENIKA HUDSON MRN: [id number] Date of Birth: 1941-04-03

## 2019-10-23 ENCOUNTER — Other Ambulatory Visit: Payer: Self-pay

## 2019-10-23 MED ORDER — SPIRIVA RESPIMAT 1.25 MCG/ACT IN AERS: 2.0000 | INHALATION_SPRAY | Freq: Every day | RESPIRATORY_TRACT | 3 refills | Status: DC

## 2019-10-24 ENCOUNTER — Encounter: Payer: PPO | Admitting: Physical Therapy

## 2019-10-27 ENCOUNTER — Other Ambulatory Visit: Payer: Self-pay | Admitting: Internal Medicine

## 2019-10-29 ENCOUNTER — Encounter: Payer: PPO | Admitting: Physical Therapy

## 2019-10-29 DIAGNOSIS — H353211 Exudative age-related macular degeneration, right eye, with active choroidal neovascularization: Secondary | ICD-10-CM | POA: Diagnosis not present

## 2019-10-29 DIAGNOSIS — H353124 Nonexudative age-related macular degeneration, left eye, advanced atrophic with subfoveal involvement: Secondary | ICD-10-CM | POA: Diagnosis not present

## 2019-10-31 ENCOUNTER — Encounter: Payer: PPO | Admitting: Physical Therapy

## 2019-11-05 ENCOUNTER — Encounter: Payer: PPO | Admitting: Physical Therapy

## 2019-11-14 DIAGNOSIS — Z961 Presence of intraocular lens: Secondary | ICD-10-CM | POA: Diagnosis not present

## 2019-11-14 DIAGNOSIS — H26491 Other secondary cataract, right eye: Secondary | ICD-10-CM | POA: Diagnosis not present

## 2019-12-05 ENCOUNTER — Other Ambulatory Visit: Payer: Self-pay | Admitting: Internal Medicine

## 2019-12-05 NOTE — Telephone Encounter (Signed)
Pt called about needing refills for apixaban (ELIQUIS) 5 MG TABS tablet Pt does not have any refills left. Please advise and Thank you!  Pharmacy is [facility] [id number] - [address], [place]  Call pt @ [phone]

## 2019-12-06 MED ORDER — APIXABAN 5 MG PO TABS: 5.0000 mg | ORAL_TABLET | Freq: Two times a day (BID) | ORAL | 2 refills | Status: DC

## 2019-12-06 NOTE — Addendum Note (Signed)
Addended by: Nanci Pina on: 12/06/2019 10:51 AM   Modules accepted: Orders

## 2019-12-06 NOTE — Telephone Encounter (Signed)
See note

## 2019-12-06 NOTE — Telephone Encounter (Signed)
Pt would like a call once sent please and thank you!  Call pt @ [phone]

## 2019-12-06 NOTE — Telephone Encounter (Signed)
Called and notified patient medication has been filled.

## 2019-12-13 DIAGNOSIS — L578 Other skin changes due to chronic exposure to nonionizing radiation: Secondary | ICD-10-CM | POA: Diagnosis not present

## 2019-12-13 DIAGNOSIS — D2339 Other benign neoplasm of skin of other parts of face: Secondary | ICD-10-CM | POA: Diagnosis not present

## 2019-12-13 DIAGNOSIS — D485 Neoplasm of uncertain behavior of skin: Secondary | ICD-10-CM | POA: Diagnosis not present

## 2019-12-13 DIAGNOSIS — L820 Inflamed seborrheic keratosis: Secondary | ICD-10-CM | POA: Diagnosis not present

## 2019-12-13 DIAGNOSIS — Z85828 Personal history of other malignant neoplasm of skin: Secondary | ICD-10-CM | POA: Diagnosis not present

## 2019-12-13 DIAGNOSIS — L821 Other seborrheic keratosis: Secondary | ICD-10-CM | POA: Diagnosis not present

## 2019-12-13 DIAGNOSIS — L739 Follicular disorder, unspecified: Secondary | ICD-10-CM | POA: Diagnosis not present

## 2019-12-13 DIAGNOSIS — D229 Melanocytic nevi, unspecified: Secondary | ICD-10-CM | POA: Diagnosis not present

## 2019-12-13 DIAGNOSIS — L814 Other melanin hyperpigmentation: Secondary | ICD-10-CM | POA: Diagnosis not present

## 2019-12-13 DIAGNOSIS — D225 Melanocytic nevi of trunk: Secondary | ICD-10-CM | POA: Diagnosis not present

## 2019-12-13 DIAGNOSIS — B351 Tinea unguium: Secondary | ICD-10-CM | POA: Diagnosis not present

## 2019-12-27 ENCOUNTER — Other Ambulatory Visit: Payer: Self-pay

## 2019-12-27 ENCOUNTER — Telehealth: Payer: Self-pay | Admitting: Internal Medicine

## 2019-12-27 MED ORDER — FLUOXETINE HCL 20 MG PO CAPS: ORAL_CAPSULE | ORAL | 1 refills | Status: DC

## 2019-12-27 MED ORDER — TRIAMTERENE-HCTZ 37.5-25 MG PO TABS: 1.0000 | ORAL_TABLET | Freq: Every day | ORAL | 1 refills | Status: DC

## 2019-12-27 NOTE — Telephone Encounter (Signed)
Pt called needing refill for FLUoxetine (PROZAC) 20 MG capsule, triamterene-hydrochlorothiazide (MAXZIDE-25) 37.5-25 MG tablet. Please advise and thank you!  Pharmacy is [facility] [id number] - [address], [place]  Call pt @ [phone]

## 2019-12-27 NOTE — Telephone Encounter (Signed)
Meds refilled.

## 2020-01-07 ENCOUNTER — Other Ambulatory Visit: Payer: PPO

## 2020-01-07 DIAGNOSIS — E7800 Pure hypercholesterolemia, unspecified: Secondary | ICD-10-CM | POA: Diagnosis not present

## 2020-01-07 DIAGNOSIS — I10 Essential (primary) hypertension: Secondary | ICD-10-CM | POA: Diagnosis not present

## 2020-01-07 DIAGNOSIS — I4821 Permanent atrial fibrillation: Secondary | ICD-10-CM | POA: Diagnosis not present

## 2020-01-07 DIAGNOSIS — R739 Hyperglycemia, unspecified: Secondary | ICD-10-CM | POA: Diagnosis not present

## 2020-01-08 LAB — CBC WITH DIFFERENTIAL/PLATELET
Basophils Absolute: 0.1 10*3/uL (ref 0.0–0.2)
Basos: 1 %
EOS (ABSOLUTE): 0.1 10*3/uL (ref 0.0–0.4)
Eos: 2 %
Hematocrit: 44.8 % (ref 34.0–46.6)
Hemoglobin: 14.9 g/dL (ref 11.1–15.9)
Immature Grans (Abs): 0 10*3/uL (ref 0.0–0.1)
Immature Granulocytes: 0 %
Lymphocytes Absolute: 1.9 10*3/uL (ref 0.7–3.1)
Lymphs: 30 %
MCH: 29.3 pg (ref 26.6–33.0)
MCHC: 33.3 g/dL (ref 31.5–35.7)
MCV: 88 fL (ref 79–97)
Monocytes Absolute: 0.5 10*3/uL (ref 0.1–0.9)
Monocytes: 9 %
Neutrophils Absolute: 3.7 10*3/uL (ref 1.4–7.0)
Neutrophils: 58 %
Platelets: 242 10*3/uL (ref 150–450)
RBC: 5.08 x10E6/uL (ref 3.77–5.28)
RDW: 11.9 % (ref 11.7–15.4)
WBC: 6.3 10*3/uL (ref 3.4–10.8)

## 2020-01-08 LAB — LIPID PANEL
Chol/HDL Ratio: 2.6 ratio (ref 0.0–4.4)
Cholesterol, Total: 166 mg/dL (ref 100–199)
HDL: 65 mg/dL (ref >39)
LDL Chol Calc (NIH): 86 mg/dL (ref 0–99)
Triglycerides: 79 mg/dL (ref 0–149)
VLDL Cholesterol Cal: 15 mg/dL (ref 5–40)

## 2020-01-08 LAB — BASIC METABOLIC PANEL
BUN/Creatinine Ratio: 19 (ref 12–28)
BUN: 17 mg/dL (ref 8–27)
CO2: 24 mmol/L (ref 20–29)
Calcium: 9.6 mg/dL (ref 8.7–10.3)
Chloride: 102 mmol/L (ref 96–106)
Creatinine, Ser: 0.88 mg/dL (ref 0.57–1.00)
GFR calc Af Amer: 73 mL/min/{1.73_m2} (ref >59)
GFR calc non Af Amer: 63 mL/min/{1.73_m2} (ref >59)
Glucose: 107 mg/dL — ABNORMAL HIGH (ref 65–99)
Potassium: 3.9 mmol/L (ref 3.5–5.2)
Sodium: 142 mmol/L (ref 134–144)

## 2020-01-08 LAB — HEPATIC FUNCTION PANEL
ALT: 10 IU/L (ref 0–32)
AST: 19 IU/L (ref 0–40)
Albumin: 4.4 g/dL (ref 3.7–4.7)
Alkaline Phosphatase: 65 IU/L (ref 39–117)
Bilirubin Total: 1 mg/dL (ref 0.0–1.2)
Bilirubin, Direct: 0.28 mg/dL (ref 0.00–0.40)
Total Protein: 6.8 g/dL (ref 6.0–8.5)

## 2020-01-08 LAB — HEMOGLOBIN A1C
Est. average glucose Bld gHb Est-mCnc: 117 mg/dL
Hgb A1c MFr Bld: 5.7 % — ABNORMAL HIGH (ref 4.8–5.6)

## 2020-01-08 LAB — TSH: TSH: 3.44 u[IU]/mL (ref 0.450–4.500)

## 2020-01-09 ENCOUNTER — Telehealth: Payer: Self-pay

## 2020-01-09 ENCOUNTER — Other Ambulatory Visit: Payer: Self-pay

## 2020-01-09 ENCOUNTER — Ambulatory Visit (INDEPENDENT_AMBULATORY_CARE_PROVIDER_SITE_OTHER): Payer: PPO | Admitting: Internal Medicine

## 2020-01-09 VITALS — BP 117/71 | HR 77 | Temp 96.3°F | Ht 63.0 in | Wt 239.0 lb

## 2020-01-09 DIAGNOSIS — E7800 Pure hypercholesterolemia, unspecified: Secondary | ICD-10-CM | POA: Diagnosis not present

## 2020-01-09 DIAGNOSIS — I4821 Permanent atrial fibrillation: Secondary | ICD-10-CM

## 2020-01-09 DIAGNOSIS — M25551 Pain in right hip: Secondary | ICD-10-CM

## 2020-01-09 DIAGNOSIS — J449 Chronic obstructive pulmonary disease, unspecified: Secondary | ICD-10-CM | POA: Diagnosis not present

## 2020-01-09 DIAGNOSIS — Z720 Tobacco use: Secondary | ICD-10-CM | POA: Diagnosis not present

## 2020-01-09 DIAGNOSIS — I10 Essential (primary) hypertension: Secondary | ICD-10-CM | POA: Diagnosis not present

## 2020-01-09 DIAGNOSIS — F419 Anxiety disorder, unspecified: Secondary | ICD-10-CM

## 2020-01-09 DIAGNOSIS — R739 Hyperglycemia, unspecified: Secondary | ICD-10-CM | POA: Diagnosis not present

## 2020-01-09 NOTE — Progress Notes (Signed)
Virtual Visit via video Note  This visit type was conducted due to national recommendations for restrictions regarding the COVID-19 pandemic (e.g. social distancing).  This format is felt to be most appropriate for this patient at this time.  All issues noted in this document were discussed and addressed.  No physical exam was performed (except for noted visual exam findings with Video Visits).   I connected with today with Danielle Yates by a video enabled telemedicine application and verified that I am speaking with the correct person using two identifiers. Location patient: home Location provider: work Persons participating in the virtual visit: patient, provider  The limitations, risks, security and privacy concerns of performing an evaluation and management service by video and the availability of in person appointments have been discussed. The patient expressed understanding and agreed to proceed.   Reason for visit: scheduled follow up.   HPI: She reports she is doing relatively well.  Went to physical therapy regarding her hip pain.  Helped.  Doing well now.  No chest pain or sob reported.  Breathing stable.  Followed by Dr Mortimer Fries.  Since using spiriva - breathing better.  Blood pressure doing well.  Taking prozac to help with anxiety.  No acid reflux, abdominal pain or vomiting reported.  Bowels stable.  Uses saline nasal flushes at night.  No significant congestion.  Still smoking - 1/2ppd.  Discussed the need to quit.     ROS: See pertinent positives and negatives per HPI.  Past Medical History:  Diagnosis Date  . Allergy   . Aortic valve sclerosis    a. TTE 2013: EF 60-65%, no RWMA, Gr1DD, trivial MR, mildly dilated LA 43 mm; b. TTE 08/2017: EF 65-70%, mild LVH, aortic valve sclerosis without stenosis, mildly dilated LA 36 mm, RV size and sys fxn nl  . Arthropathy, unspecified, site unspecified   . Cataract   . Chronic rhinitis   . Disorder of bone and cartilage, unspecified     . Diverticulosis of colon (without mention of hemorrhage)   . Essential hypertension    a. refractory HTN; b. renal US 2013 negative for RAS  . External hemorrhoids without mention of complication   . Medication intolerance   . Obesity, unspecified   . Other abnormal glucose   . Panic disorder without agoraphobia   . Peripheral arterial disease ([place])    a.  Known bilateral common iliac disease-> medical therapy-asymptomatic.  [name] Persistent atrial fibrillation ([place])    a. diagnosed 10/19/17; b. CHADS2VASc 5 (HTN, age x 2, vascular disease, female)-->Eliquis  . Pure hypercholesterolemia   . Tobacco use disorder   . Unspecified vitamin D deficiency     Past Surgical History:  Procedure Laterality Date  . APPENDECTOMY  1985  . CHOLECYSTECTOMY  1985   with open wound  . EYE SURGERY  2011   CATARACTS -  BOTH  . Removed spot on face     [name]  . TONSILLECTOMY AND ADENOIDECTOMY  1946  . VAGINAL HYSTERECTOMY  1982   fibroid and endometriosis; one ovary remaining    Family History  Problem Relation Age of Onset  . Hypertension Sister   . Heart disease Brother   . Hypertension Brother   . Hyperlipidemia Brother   . Aneurysm Mother   . Rheumatic fever Mother        as an adult  . Heart disease Father        MI x 2    SOCIAL HX: reviewed.  Current Outpatient Medications:  .  albuterol (VENTOLIN HFA) 108 (90 Base) MCG/ACT inhaler, INHALE 2 PUFFS INTO THE LUNGS EVERY 6 HOURS AS NEEDED FOR WHEEZING OR SHORTNESS OF BREATH, Disp: 8.5 g, Rfl: 2 .  apixaban (ELIQUIS) 5 MG TABS tablet, Take 1 tablet (5 mg total) by mouth 2 (two) times daily., Disp: 180 tablet, Rfl: 2 .  diltiazem (TIAZAC) 360 MG 24 hr capsule, TAKE 1 CAPSULE(360 MG) BY MOUTH DAILY, Disp: 90 capsule, Rfl: 2 .  enalapril (VASOTEC) 20 MG tablet, TAKE 1 TABLET BY MOUTH EVERY MORNING AND 2 TABLETS IN THE AFTERNOON, Disp: 270 tablet, Rfl: 3 .  FLUoxetine (PROZAC) 20 MG capsule, TAKE 1 CAPSULE(20 MG) BY MOUTH DAILY, Disp:  90 capsule, Rfl: 1 .  fluticasone (FLONASE) 50 MCG/ACT nasal spray, INSTILL 2 SPRAYS IN EACH NOSTRIL DAILY, Disp: 48 g, Rfl: 2 .  Multiple Vitamins-Minerals (PRESERVISION AREDS) CAPS, , Disp: , Rfl:  .  pravastatin (PRAVACHOL) 10 MG tablet, Take 1 tablet (10 mg total) by mouth daily., Disp: 90 tablet, Rfl: 3 .  Probiotic Product (PROBIOTIC DAILY PO), Take 1 tablet by mouth., Disp: , Rfl:  .  Tiotropium Bromide Monohydrate (SPIRIVA RESPIMAT) 1.25 MCG/ACT AERS, Inhale 2 puffs into the lungs daily., Disp: 4 g, Rfl: 3 .  triamterene-hydrochlorothiazide (MAXZIDE-25) 37.5-25 MG tablet, Take 1 tablet by mouth daily., Disp: 90 tablet, Rfl: 1 .  VITAMIN D, CHOLECALCIFEROL, PO, Take by mouth daily., Disp: , Rfl:   EXAM:  VITALS per patient if applicable: 552/08  GENERAL: alert, oriented, appears well and in no acute distress  HEENT: atraumatic, conjunttiva clear, no obvious abnormalities on inspection of external nose and ears  NECK: normal movements of the head and neck  LUNGS: on inspection no signs of respiratory distress, breathing rate appears normal, no obvious gross SOB, gasping or wheezing  CV: no obvious cyanosis  PSYCH/NEURO: pleasant and cooperative, no obvious depression or anxiety, speech and thought processing grossly intact  ASSESSMENT AND PLAN:  Discussed the following assessment and plan:  Anxiety Stable on prozac.  Follow.    Atrial fibrillation ([place]) Followed by cardiology.  On eliquis.  Doing well.  No increased heart rate or palpitations reported.   Chronic obstructive pulmonary disease ([facility]) Followed by pulmonary.  Breathing stable.  Doing well on spiriva.    Essential hypertension, benign Blood pressure under good control.  Continue same medication regimen.  Follow pressures.  Follow metabolic panel.    Hyperglycemia Low carb diet and exercise.  Follow met b and a1c.   Pure hypercholesterolemia On pravastatin.  Tolerating.  Low cholesterol diet and exercise.   Follow lipid panel and liver function tests.    Right hip pain Resolved with therapy.   Tobacco abuse Discussed the need to quit smoking.  Follow.     I discussed the assessment and treatment plan with the patient. The patient was provided an opportunity to ask questions and all were answered. The patient agreed with the plan and demonstrated an understanding of the instructions.   The patient was advised to call back or seek an in-person evaluation if the symptoms worsen or if the condition fails to improve as anticipated.   Einar Pheasant, MD

## 2020-01-09 NOTE — Telephone Encounter (Signed)
Labs reordered for [facility]  

## 2020-01-13 ENCOUNTER — Encounter: Payer: Self-pay | Admitting: Internal Medicine

## 2020-01-13 NOTE — Assessment & Plan Note (Signed)
On pravastatin.  Tolerating.  Low cholesterol diet and exercise.  Follow lipid panel and liver function tests.

## 2020-01-13 NOTE — Assessment & Plan Note (Signed)
Discussed the need to quit smoking. Follow.  

## 2020-01-13 NOTE — Assessment & Plan Note (Signed)
Low carb diet and exercise.  Follow met b and a1c.  

## 2020-01-13 NOTE — Assessment & Plan Note (Signed)
Blood pressure under good control.  Continue same medication regimen.  Follow pressures.  Follow metabolic panel.   

## 2020-01-13 NOTE — Assessment & Plan Note (Signed)
Stable on prozac.  Follow.

## 2020-01-13 NOTE — Assessment & Plan Note (Signed)
Followed by cardiology.  On eliquis.  Doing well.  No increased heart rate or palpitations reported.

## 2020-01-13 NOTE — Assessment & Plan Note (Signed)
Resolved with therapy 

## 2020-01-13 NOTE — Assessment & Plan Note (Signed)
Followed by pulmonary.  Breathing stable.  Doing well on spiriva.

## 2020-01-21 ENCOUNTER — Other Ambulatory Visit: Payer: Self-pay

## 2020-01-21 MED ORDER — ALBUTEROL SULFATE HFA 108 (90 BASE) MCG/ACT IN AERS: INHALATION_SPRAY | RESPIRATORY_TRACT | 2 refills | Status: AC

## 2020-01-21 NOTE — Progress Notes (Signed)
Cardiology Office Note    Date:  01/30/2020   ID:  Nishitha, Duddy [birth year], MRN [id number]  PCP:  Einar Pheasant, MD  Cardiologist:  Kathlyn Sacramento, MD  Electrophysiologist:  None   Chief Complaint: Follow up  History of Present Illness:   ROISIN VILHAUER is a 79 y.o. 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 Fib.  She has previously not tolerated metoprolol due to bradycardia and fatigue. She has not tolerated amlodipine or Coreg secondary to headache and dizziness. She is also known to be intolerant to high-intensity statins, though has been tolerating pravastatin. She has previously undergone echo in 2013 that showed normal LV systolic function with an EF of 60-65%, no RWMA, Gr1DD, trivial mitral regurgitation, and a mildly dilated left atrium measuring 43 mm. Renal artery ultrasound in 2013 showed no evidence of RAS. She has known bilateral common iliac artery disease that is being managed medically given lack of claudication symptoms. She was diagnosed with A. fib with RVR in 10/2017 and was started on Eliquis at that time given aCHADS2VASc of 5.She was asymptomatic. Echo in 08/2017 showed normal LV systolic function with an EF of 65 to 70%, mild LVH, aortic valve sclerosis without stenosis, mildlydilated left atrium, RV cavity size is normal with normal RV systolic function. She was seen in the office in 07/2018 and was doing reasonably well. She continued to be in A. fib and was asymptomatic. She hadlost 10 pounds over the prior 6 months with improved diet. She was trying to taper her tobacco use. She denied any claudication. Given her ventricular rate was not optimally controlled on maximal dose diltiazem she was started on Lopressor 25 mg twice daily.However, she did not tolerate Lopressor 25 mg twice daily or  12.5 mg twice daily, leading to its discontinuation.  She was seen in the office in 01/2019 for follow up and was doing well. She remained in Afib. Her weight was down another 9 pounds in the setting of a healthier diet.  Overall, her ventricular rates were well controlled and no changes were made.  She was last seen virtually in 07/2019 and was doing well.   No changes were made.  She comes in doing well from a cardiac perspective.  She denies any chest pain, shortness of breath, palpitations, dizziness, recently because syncope, falls, lower extremity swelling, hematochezia, melena, hematuria, hemoptysis, or hematemesis.  She is tolerating Eliquis without any issues.  BP and heart rate remain well controlled at home.  She denies any lifestyle limiting claudication.  She does continue to smoke.  She has received both of her COVID-19 vaccinations.  She does continue to have some right hip discomfort though worked with PT since she was last seen with improvement in her symptoms.  She does not have any issues or concerns at this time.   Labs independently reviewed: 01/2020 - BUN 17, serum creatinine 0.88, potassium 3.9, albumin 4.4, AST/ALT normal, TSH normal, TC 166, TG 79, HDL 65, LDL 86, A1c 5.7, Hgb 14.9, PLT 242  Past Medical History:  Diagnosis Date  . Allergy   . Aortic valve sclerosis    a. TTE 2013: EF 60-65%, no RWMA, Gr1DD, trivial MR, mildly dilated LA 43 mm; b. TTE 08/2017: EF 65-70%, mild LVH, aortic valve sclerosis without stenosis, mildly dilated LA 36 mm, RV size and sys fxn nl  .  Arthropathy, unspecified, site unspecified   . Cataract   . Chronic rhinitis   . Disorder of bone and cartilage, unspecified   . Diverticulosis of colon (without mention of hemorrhage)   . Essential hypertension    a. refractory HTN; b. renal US 2013 negative for RAS  . External hemorrhoids without mention of complication   . Medication intolerance   . Obesity, unspecified   . Other abnormal glucose   .  Panic disorder without agoraphobia   . Peripheral arterial disease ([place])    a.  Known bilateral common iliac disease-> medical therapy-asymptomatic.  [name] Persistent atrial fibrillation ([place])    a. diagnosed 10/19/17; b. CHADS2VASc 5 (HTN, age x 2, vascular disease, female)-->Eliquis  . Pure hypercholesterolemia   . Tobacco use disorder   . Unspecified vitamin D deficiency     Past Surgical History:  Procedure Laterality Date  . APPENDECTOMY  1985  . CHOLECYSTECTOMY  1985   with open wound  . EYE SURGERY  2011   CATARACTS -  BOTH  . Removed spot on face     [name]  . TONSILLECTOMY AND ADENOIDECTOMY  1946  . VAGINAL HYSTERECTOMY  1982   fibroid and endometriosis; one ovary remaining    Current Medications: Current Meds  Medication Sig  . albuterol (VENTOLIN HFA) 108 (90 Base) MCG/ACT inhaler INHALE 2 PUFFS INTO THE LUNGS EVERY 6 HOURS AS NEEDED FOR WHEEZING OR SHORTNESS OF BREATH  . apixaban (ELIQUIS) 5 MG TABS tablet Take 1 tablet (5 mg total) by mouth 2 (two) times daily.  [name] diltiazem (TIAZAC) 360 MG 24 hr capsule TAKE 1 CAPSULE(360 MG) BY MOUTH DAILY  . enalapril (VASOTEC) 20 MG tablet TAKE 1 TABLET BY MOUTH EVERY MORNING AND 2 TABLETS IN THE AFTERNOON  . FLUoxetine (PROZAC) 20 MG capsule TAKE 1 CAPSULE(20 MG) BY MOUTH DAILY  . fluticasone (FLONASE) 50 MCG/ACT nasal spray INSTILL 2 SPRAYS IN EACH NOSTRIL DAILY  . Multiple Vitamins-Minerals (PRESERVISION AREDS) CAPS   . pravastatin (PRAVACHOL) 10 MG tablet Take 1 tablet (10 mg total) by mouth daily.  . Probiotic Product (PROBIOTIC DAILY PO) Take 1 tablet by mouth.  . Tiotropium Bromide Monohydrate (SPIRIVA RESPIMAT) 1.25 MCG/ACT AERS Inhale 2 puffs into the lungs daily.  [name] triamterene-hydrochlorothiazide (MAXZIDE-25) 37.5-25 MG tablet Take 1 tablet by mouth daily.  [name] VITAMIN D, CHOLECALCIFEROL, PO Take by mouth daily.    Allergies:   Amlodipine, Codeine, Coreg [carvedilol], Crestor [rosuvastatin], Metoprolol, Statins, and  Nickel   Social History   Socioeconomic History  . Marital status: Widowed    Spouse name: Not on file  . Number of children: 0  . Years of education: college  . Highest education level: Not on file  Occupational History  . Occupation: Estate manager/land agent    Comment: [employer] x 20 years  Tobacco Use  . Smoking status: Current Every Day Smoker    Packs/day: 0.50    Years: 50.00    Pack years: 25.00    Types: Cigarettes  . Smokeless tobacco: Never Used  . Tobacco comment: 10 a day  Substance and Sexual Activity  . Alcohol use: No    Alcohol/week: 0.0 standard drinks  . Drug use: No  . Sexual activity: Never  Other Topics Concern  . Not on file  Social History Narrative   Lives alone. Widowed after 23 years. Not dating. Has dog. Exercise: Walking; light has not been walking as she used to before her 2 dogs passed away. Tries to walk some.  Caffeine: Carbonated beverages, 3 servings/day;Diet coke. 3 a day.      10/02/12  PER PATIENT'S LAVENDER FORM: WALK 5 DAYS/WEEK FOR 30 MINUTES   Social Determinants of Health   Financial Resource Strain:   . Difficulty of Paying Living Expenses: Not on file  Food Insecurity:   . Worried About Charity fundraiser in the Last Year: Not on file  . Ran Out of Food in the Last Year: Not on file  Transportation Needs:   . Lack of Transportation (Medical): Not on file  . Lack of Transportation (Non-Medical): Not on file  Physical Activity:   . Days of Exercise per Week: Not on file  . Minutes of Exercise per Session: Not on file  Stress:   . Feeling of Stress : Not on file  Social Connections:   . Frequency of Communication with Friends and Family: Not on file  . Frequency of Social Gatherings with Friends and Family: Not on file  . Attends Religious Services: Not on file  . Active Member of Clubs or Organizations: Not on file  . Attends Archivist Meetings: Not on file  . Marital Status: Not on file     Family History:  The  patient's family history includes Aneurysm in her mother; Cancer in her brother; Heart disease in her brother and father; Hyperlipidemia in her brother; Hypertension in her brother and sister; Rheumatic fever in her mother.  ROS:   Review of Systems  Constitutional: Negative for chills, diaphoresis, fever, malaise/fatigue and weight loss.  HENT: Negative for congestion.   Eyes: Negative for discharge and redness.  Respiratory: Negative for cough, hemoptysis, sputum production, shortness of breath and wheezing.   Cardiovascular: Negative for chest pain, palpitations, orthopnea, claudication, leg swelling and PND.  Gastrointestinal: Negative for abdominal pain, blood in stool, heartburn, melena, nausea and vomiting.  Genitourinary: Negative for hematuria.  Musculoskeletal: Positive for joint pain. Negative for falls and myalgias.       Right hip discomfort  Skin: Negative for rash.  Neurological: Negative for dizziness, tingling, tremors, sensory change, speech change, focal weakness, loss of consciousness and weakness.  Endo/Heme/Allergies: Does not bruise/bleed easily.  Psychiatric/Behavioral: Negative for substance abuse. The patient is not nervous/anxious.   All other systems reviewed and are negative.    EKGs/Labs/Other Studies Reviewed:    Studies reviewed were summarized above. The additional studies were reviewed today:  2D echo 08/2017: - Left ventricle: The cavity size was normal. Wall thickness was increased in a pattern of mild LVH. Systolic function was vigorous. The estimated ejection fraction was in the range of 65% to 70%. - Aortic valve: Trileaflet; mildly thickened leaflets. Sclerosis without stenosis. - Mitral valve: Mildly thickened leaflets . - Left atrium: The atrium was mildly dilated. - Right ventricle: The cavity size was normal. Systolic function was normal.   EKG:  EKG is ordered today.  The EKG ordered today demonstrates A. fib, 90 bpm,  nonspecific inferior ST-T changes, essentially unchanged from prior study  Recent Labs: 01/07/2020: ALT 10; BUN 17; Creatinine, Ser 0.88; Hemoglobin 14.9; Platelets 242; Potassium 3.9; Sodium 142; TSH 3.440  Recent Lipid Panel    Component Value Date/Time   CHOL 166 01/07/2020 0858   TRIG 79 01/07/2020 0858   HDL 65 01/07/2020 0858   CHOLHDL 2.6 01/07/2020 0858   LDLCALC 86 01/07/2020 0858    PHYSICAL EXAM:    VS:  BP 128/72 (BP Location: Left Arm, Patient Position: Standing, Cuff Size: Large)  Pulse 90   Ht 5\' 2"  (1.575 m)   Wt 242 lb 8 oz (110 kg)   SpO2 97%   BMI 44.35 kg/m   BMI: Body mass index is 44.35 kg/m.  Physical Exam  Constitutional: She is oriented to person, place, and time. She appears well-developed and well-nourished.  HENT:  Head: Normocephalic and atraumatic.  Eyes: Right eye exhibits no discharge. Left eye exhibits no discharge.  Neck: No JVD present.  Cardiovascular: Normal rate, S1 normal, S2 normal and normal heart sounds. An irregularly irregular rhythm present. Exam reveals no distant heart sounds, no friction rub, no midsystolic click and no opening snap.  No murmur heard. Pulses:      Dorsalis pedis pulses are 2+ on the right side and 2+ on the left side.       Posterior tibial pulses are 2+ on the right side and 2+ on the left side.  Pulmonary/Chest: Effort normal and breath sounds normal. No respiratory distress. She has no decreased breath sounds. She has no wheezes. She has no rales. She exhibits no tenderness.  Abdominal: Soft. She exhibits no distension. There is no abdominal tenderness.  Musculoskeletal:        General: No edema.     Cervical back: Normal range of motion.  Neurological: She is alert and oriented to person, place, and time.  Skin: Skin is warm and dry. No cyanosis. Nails show no clubbing.  Psychiatric: She has a normal mood and affect. Her speech is normal and behavior is normal. Judgment and thought content normal.    Wt  Readings from Last 3 Encounters:  01/30/20 242 lb 8 oz (110 kg)  01/09/20 239 lb (108.4 kg)  08/31/19 235 lb (106.6 kg)     ASSESSMENT & PLAN:   1. Permanent A. fib.  She continues to do very well with well-controlled ventricular rates.  She is asymptomatic.  Continue current dose diltiazem 360 mg daily.  She is intolerant to beta-blocker as outlined above.  Given her CHA2DS2-VASc of 5 (HTN, age x 2, vascular disease, gender) she will be continued on Eliquis 5 mg twice daily.  She does not meet reduced dosing criteria.  Recent CBC showed stable hemoglobin.  2. PAD: Stable.  She continues to deny any symptoms for lifestyle limiting claudication.  Distal lower extremity pulses are 2+ bilaterally on exam today.  With PT, she did note some improvement in her right hip pain.  Smoking cessation continues to be recommended.  3. HTN: Blood pressure is well controlled.  Continue current therapy including long-acting diltiazem and triamterene/HCTZ.  4. HLD: LDL of 86 from 01/2020 with normal liver function.  High intensity statin intolerant.  Remains on pravastatin.  5. Tobacco use: Complete cessation is recommended.  Disposition: F/u with Dr. Fletcher Anon or an APP in 6 months, sooner if needed.   Medication Adjustments/Labs and Tests Ordered: Current medicines are reviewed at length with the patient today.  Concerns regarding medicines are outlined above. Medication changes, Labs and Tests ordered today are summarized above and listed in the Patient Instructions accessible in Encounters.   Signed, Christell Faith, PA-C 01/30/2020 9:41 AM     [facility] [address] [place] [address] [place],  [address] [phone]

## 2020-01-28 ENCOUNTER — Ambulatory Visit: Payer: PPO | Admitting: Physician Assistant

## 2020-01-28 DIAGNOSIS — H353211 Exudative age-related macular degeneration, right eye, with active choroidal neovascularization: Secondary | ICD-10-CM | POA: Diagnosis not present

## 2020-01-30 ENCOUNTER — Ambulatory Visit: Payer: PPO | Admitting: Physician Assistant

## 2020-01-30 ENCOUNTER — Encounter: Payer: Self-pay | Admitting: Physician Assistant

## 2020-01-30 ENCOUNTER — Other Ambulatory Visit: Payer: Self-pay

## 2020-01-30 VITALS — BP 128/72 | HR 90 | Ht 62.0 in | Wt 242.5 lb

## 2020-01-30 DIAGNOSIS — I10 Essential (primary) hypertension: Secondary | ICD-10-CM

## 2020-01-30 DIAGNOSIS — I739 Peripheral vascular disease, unspecified: Secondary | ICD-10-CM | POA: Diagnosis not present

## 2020-01-30 DIAGNOSIS — I4821 Permanent atrial fibrillation: Secondary | ICD-10-CM

## 2020-01-30 DIAGNOSIS — Z720 Tobacco use: Secondary | ICD-10-CM

## 2020-01-30 DIAGNOSIS — E782 Mixed hyperlipidemia: Secondary | ICD-10-CM | POA: Diagnosis not present

## 2020-01-30 NOTE — Patient Instructions (Signed)
Medication Instructions:  - Your physician recommends that you continue on your current medications as directed. Please refer to the Current Medication list given to you today.  *If you need a refill on your cardiac medications before your next appointment, please call your pharmacy*  Lab Work: - none ordered  If you have labs (blood work) drawn today and your tests are completely normal, you will receive your results only by: [name] MyChart Message (if you have MyChart) OR . A paper copy in the mail If you have any lab test that is abnormal or we need to change your treatment, we will call you to review the results.  Testing/Procedures: - none ordered  Follow-Up: At [facility], you and your health needs are our priority.  As part of our continuing mission to provide you with exceptional heart care, we have created designated Provider Care Teams.  These Care Teams include your primary Cardiologist (physician) and Advanced Practice Providers (APPs -  Physician Assistants and Nurse Practitioners) who all work together to provide you with the care you need, when you need it.  Your next appointment:   6 month(s)  The format for your next appointment:   In Person  Provider:    You may see Kathlyn Sacramento, MD or one of the following Advanced Practice Providers on your designated Care Team:    Murray Hodgkins, NP  Christell Faith, PA-C  Marrianne Mood, PA-C   Other Instructions - n/a

## 2020-01-31 NOTE — Telephone Encounter (Signed)
Consented

## 2020-02-21 ENCOUNTER — Other Ambulatory Visit: Payer: Self-pay | Admitting: Cardiovascular Disease

## 2020-02-21 ENCOUNTER — Other Ambulatory Visit: Payer: Self-pay | Admitting: Internal Medicine

## 2020-02-25 ENCOUNTER — Other Ambulatory Visit: Payer: Self-pay | Admitting: Internal Medicine

## 2020-02-25 MED ORDER — SPIRIVA RESPIMAT 1.25 MCG/ACT IN AERS: 2.0000 | INHALATION_SPRAY | Freq: Every day | RESPIRATORY_TRACT | 6 refills | Status: DC

## 2020-03-11 ENCOUNTER — Other Ambulatory Visit: Payer: Self-pay

## 2020-03-11 ENCOUNTER — Ambulatory Visit: Payer: PPO | Admitting: Dermatology

## 2020-03-11 ENCOUNTER — Encounter: Payer: Self-pay | Admitting: Dermatology

## 2020-03-11 DIAGNOSIS — L738 Other specified follicular disorders: Secondary | ICD-10-CM | POA: Diagnosis not present

## 2020-03-11 DIAGNOSIS — D485 Neoplasm of uncertain behavior of skin: Secondary | ICD-10-CM

## 2020-03-11 DIAGNOSIS — B079 Viral wart, unspecified: Secondary | ICD-10-CM | POA: Diagnosis not present

## 2020-03-11 DIAGNOSIS — D489 Neoplasm of uncertain behavior, unspecified: Secondary | ICD-10-CM

## 2020-03-11 NOTE — Patient Instructions (Signed)

## 2020-03-11 NOTE — Progress Notes (Signed)
   Follow-Up Visit   Subjective  Danielle Yates is a 79 y.o. female who presents for the following: crusty spot (on forehead for about 3 weeks. Not itchy or painful).  The following portions of the chart were reviewed this encounter and updated as appropriate: Tobacco  Allergies  Meds  Problems  Med Hx  Surg Hx  Fam Hx      Review of Systems: No other skin or systemic complaints.  Objective  Well appearing patient in no apparent distress; mood and affect are within normal limits.  A focused examination was performed including head, including the scalp, face, neck, nose, ears, eyelids, and lips. Relevant physical exam findings are noted in the Assessment and Plan.  Objective  Mid Forehead: 0.4cm erythematous scaly papule  Assessment & Plan  Neoplasm of uncertain behavior Mid Forehead  Skin / nail biopsy Type of biopsy: tangential   Informed consent: discussed and consent obtained   Timeout: patient name, date of birth, surgical site, and procedure verified   Procedure prep:  Patient was prepped and draped in usual sterile fashion Prep type:  Isopropyl alcohol Anesthesia: the lesion was anesthetized in a standard fashion   Anesthetic:  1% lidocaine w/ epinephrine 1-100,000 buffered w/ 8.4% NaHCO3 Instrument used: flexible razor blade   Hemostasis achieved with: aluminum chloride   Outcome: patient tolerated procedure well   Post-procedure details: sterile dressing applied and wound care instructions given   Dressing type: petrolatum and pressure dressing    Specimen 1 - Surgical pathology Differential Diagnosis: R/O verruca vs nevus vs bcc or scc Check Margins: No  R/O verruca vs nevus vs bcc or scc  Sebaceous Hyperplasia at forehead - Small yellow papules with a central dell - Benign - Observe   Return for follow up as scheduled. sooner if needed.  IDonzetta Kohut, CMA, am acting as scribe for Forest Gleason, MD .  Documentation: I have reviewed the above  documentation for accuracy and completeness, and I agree with the above.  Forest Gleason, MD

## 2020-03-12 NOTE — Progress Notes (Signed)
Skin , mid forehead VERRUCA VULGARIS, IRRITATED  This is a WART caused by the human papilloma virus. It is not dangerous but is contagious and can spread to other areas of skin or other people if it is not completely gone. No additional treatment is needed. However, if it comes back, we can freeze it in clinic with liquid nitrogen (a quick in office procedure) or you can also treat it at home with an over the counter salicylic wart treatment (slower).  Please call the office at [phone] or message [place] if you have have any questions.

## 2020-05-06 ENCOUNTER — Telehealth: Payer: Self-pay | Admitting: Internal Medicine

## 2020-05-06 NOTE — Telephone Encounter (Signed)
Lab orders are in and patient is aware.

## 2020-05-06 NOTE — Telephone Encounter (Signed)
Pt wants to know if her lab orders were sent to [facility] so she can get them done before her visit on 6/8? She would like a call back.

## 2020-05-07 DIAGNOSIS — R739 Hyperglycemia, unspecified: Secondary | ICD-10-CM | POA: Diagnosis not present

## 2020-05-07 DIAGNOSIS — E7800 Pure hypercholesterolemia, unspecified: Secondary | ICD-10-CM | POA: Diagnosis not present

## 2020-05-07 DIAGNOSIS — I10 Essential (primary) hypertension: Secondary | ICD-10-CM | POA: Diagnosis not present

## 2020-05-08 LAB — BASIC METABOLIC PANEL
BUN/Creatinine Ratio: 18 (ref 12–28)
BUN: 16 mg/dL (ref 8–27)
CO2: 23 mmol/L (ref 20–29)
Calcium: 9.8 mg/dL (ref 8.7–10.3)
Chloride: 98 mmol/L (ref 96–106)
Creatinine, Ser: 0.87 mg/dL (ref 0.57–1.00)
GFR calc Af Amer: 74 mL/min/{1.73_m2} (ref >59)
GFR calc non Af Amer: 64 mL/min/{1.73_m2} (ref >59)
Glucose: 125 mg/dL — ABNORMAL HIGH (ref 65–99)
Potassium: 3.8 mmol/L (ref 3.5–5.2)
Sodium: 139 mmol/L (ref 134–144)

## 2020-05-08 LAB — HEMOGLOBIN A1C
Est. average glucose Bld gHb Est-mCnc: 114 mg/dL
Hgb A1c MFr Bld: 5.6 % (ref 4.8–5.6)

## 2020-05-08 LAB — LIPID PANEL
Chol/HDL Ratio: 3 ratio (ref 0.0–4.4)
Cholesterol, Total: 176 mg/dL (ref 100–199)
HDL: 58 mg/dL (ref >39)
LDL Chol Calc (NIH): 98 mg/dL (ref 0–99)
Triglycerides: 113 mg/dL (ref 0–149)
VLDL Cholesterol Cal: 20 mg/dL (ref 5–40)

## 2020-05-08 LAB — HEPATIC FUNCTION PANEL
ALT: 17 IU/L (ref 0–32)
AST: 22 IU/L (ref 0–40)
Albumin: 4.5 g/dL (ref 3.7–4.7)
Alkaline Phosphatase: 64 IU/L (ref 48–121)
Bilirubin Total: 1.1 mg/dL (ref 0.0–1.2)
Bilirubin, Direct: 0.28 mg/dL (ref 0.00–0.40)
Total Protein: 7.1 g/dL (ref 6.0–8.5)

## 2020-05-09 ENCOUNTER — Encounter: Payer: Self-pay | Admitting: Internal Medicine

## 2020-05-12 DIAGNOSIS — H353211 Exudative age-related macular degeneration, right eye, with active choroidal neovascularization: Secondary | ICD-10-CM | POA: Diagnosis not present

## 2020-05-13 ENCOUNTER — Other Ambulatory Visit: Payer: Self-pay

## 2020-05-13 ENCOUNTER — Telehealth (INDEPENDENT_AMBULATORY_CARE_PROVIDER_SITE_OTHER): Payer: PPO | Admitting: Internal Medicine

## 2020-05-13 DIAGNOSIS — I4821 Permanent atrial fibrillation: Secondary | ICD-10-CM | POA: Diagnosis not present

## 2020-05-13 DIAGNOSIS — E7800 Pure hypercholesterolemia, unspecified: Secondary | ICD-10-CM | POA: Diagnosis not present

## 2020-05-13 DIAGNOSIS — F439 Reaction to severe stress, unspecified: Secondary | ICD-10-CM

## 2020-05-13 DIAGNOSIS — Z720 Tobacco use: Secondary | ICD-10-CM

## 2020-05-13 DIAGNOSIS — R739 Hyperglycemia, unspecified: Secondary | ICD-10-CM

## 2020-05-13 DIAGNOSIS — I10 Essential (primary) hypertension: Secondary | ICD-10-CM | POA: Diagnosis not present

## 2020-05-13 DIAGNOSIS — J449 Chronic obstructive pulmonary disease, unspecified: Secondary | ICD-10-CM

## 2020-05-13 NOTE — Progress Notes (Signed)
Patient ID: Danielle Yates, female   DOB: [birth year], 79 y.o.   MRN: [id number]    Virtual Visit via video Note  This visit type was conducted due to national recommendations for restrictions regarding the COVID-19 pandemic (e.g. social distancing).  This format is felt to be most appropriate for this patient at this time.  All issues noted in this document were discussed and addressed.  No physical exam was performed (except for noted visual exam findings with Video Visits).   I connected with Danielle Yates by a video enabled telemedicine application and verified that I am speaking with the correct person using two identifiers. Location patient: home Location provider: work Persons participating in the virtual visit: patient, provider  The limitations, risks, security and privacy concerns of performing an evaluation and management service by video and the availability of in person appointments have been discussed.   It has also been discussed with the patient that there may be a patient responsible charge related to this service. The  Patient has expressed understanding and has agreed to proceed.  We connected initially via video.  Given some technical difficulty, we had to convert to a telephone visit.  We continued the visit with audio only.     Reason for visit: scheduled follow up.    HPI: Follow up regarding her sugar, cholesterol and blood pressure.  She has been seeing pulmonary.  Breathing is stable.  Wants to start getting inhalers refilled here - since stable.  No chest pain or increased sob reported.  No acid reflux.  Previous problems with pollen.  Took mucinex and used flonase.  Helped.  No significant issues now.  No abdominal pain or bowel change reported.  Blood pressures ok - averaging <120/70-80.  Discussed labs.  Increased stress.  Sister passed away recently.  Son with medical issues as well.  Overall handling things relatively well.  Has good support.     ROS: See  pertinent positives and negatives per HPI.  Past Medical History:  Diagnosis Date  . Allergy   . Aortic valve sclerosis    a. TTE 2013: EF 60-65%, no RWMA, Gr1DD, trivial MR, mildly dilated LA 43 mm; b. TTE 08/2017: EF 65-70%, mild LVH, aortic valve sclerosis without stenosis, mildly dilated LA 36 mm, RV size and sys fxn nl  . Arthropathy, unspecified, site unspecified   . Cataract   . Chronic rhinitis   . Disorder of bone and cartilage, unspecified   . Diverticulosis of colon (without mention of hemorrhage)   . Essential hypertension    a. refractory HTN; b. renal US 2013 negative for RAS  . External hemorrhoids without mention of complication   . Medication intolerance   . Obesity, unspecified   . Other abnormal glucose   . Panic disorder without agoraphobia   . Peripheral arterial disease ([place])    a.  Known bilateral common iliac disease-> medical therapy-asymptomatic.  [name] Persistent atrial fibrillation ([place])    a. diagnosed 10/19/17; b. CHADS2VASc 5 (HTN, age x 2, vascular disease, female)-->Eliquis  . Pure hypercholesterolemia   . Tobacco use disorder   . Unspecified vitamin D deficiency     Past Surgical History:  Procedure Laterality Date  . APPENDECTOMY  1985  . CHOLECYSTECTOMY  1985   with open wound  . EYE SURGERY  2011   CATARACTS -  BOTH  . Removed spot on face     [name]  . TONSILLECTOMY AND ADENOIDECTOMY  1946  . VAGINAL HYSTERECTOMY  1982   fibroid and endometriosis; one ovary remaining    Family History  Problem Relation Age of Onset  . Hypertension Sister   . Heart disease Brother   . Hypertension Brother   . Hyperlipidemia Brother   . Cancer Brother   . Aneurysm Mother   . Rheumatic fever Mother        as an adult  . Heart disease Father        MI x 2    SOCIAL HX: reviewed.    Current Outpatient Medications:  .  albuterol (VENTOLIN HFA) 108 (90 Base) MCG/ACT inhaler, INHALE 2 PUFFS INTO THE LUNGS EVERY 6 HOURS AS NEEDED FOR WHEEZING OR  SHORTNESS OF BREATH, Disp: 8.5 g, Rfl: 2 .  apixaban (ELIQUIS) 5 MG TABS tablet, Take 1 tablet (5 mg total) by mouth 2 (two) times daily., Disp: 180 tablet, Rfl: 2 .  diltiazem (TIAZAC) 360 MG 24 hr capsule, TAKE 1 CAPSULE(360 MG) BY MOUTH DAILY, Disp: 90 capsule, Rfl: 1 .  enalapril (VASOTEC) 20 MG tablet, TAKE 1 TABLET BY MOUTH EVERY MORNING AND 2 TABLETS IN THE AFTERNOON, Disp: 270 tablet, Rfl: 3 .  FLUoxetine (PROZAC) 20 MG capsule, TAKE 1 CAPSULE(20 MG) BY MOUTH DAILY, Disp: 90 capsule, Rfl: 1 .  fluticasone (FLONASE) 50 MCG/ACT nasal spray, INSTILL 2 SPRAYS IN EACH NOSTRIL DAILY, Disp: 48 g, Rfl: 2 .  Multiple Vitamins-Minerals (PRESERVISION AREDS) CAPS, , Disp: , Rfl:  .  pravastatin (PRAVACHOL) 10 MG tablet, TAKE 1 TABLET(10 MG) BY MOUTH DAILY, Disp: 90 tablet, Rfl: 3 .  Probiotic Product (PROBIOTIC DAILY PO), Take 1 tablet by mouth., Disp: , Rfl:  .  Tiotropium Bromide Monohydrate (SPIRIVA RESPIMAT) 1.25 MCG/ACT AERS, Inhale 2 puffs into the lungs daily., Disp: 4 g, Rfl: 6 .  triamterene-hydrochlorothiazide (MAXZIDE-25) 37.5-25 MG tablet, Take 1 tablet by mouth daily., Disp: 90 tablet, Rfl: 1 .  VITAMIN D, CHOLECALCIFEROL, PO, Take by mouth daily., Disp: , Rfl:   EXAM:  VITALS per patient if applicable: <101/75-10  GENERAL: alert, oriented, appears well and in no acute distress  HEENT: atraumatic, conjunttiva clear, no obvious abnormalities on inspection of external nose and ears  NECK: normal movements of the head and neck  LUNGS: on inspection no signs of respiratory distress, breathing rate appears normal, no obvious gross SOB, gasping or wheezing  CV: no obvious cyanosis  PSYCH/NEURO: pleasant and cooperative, no obvious depression or anxiety, speech and thought processing grossly intact  ASSESSMENT AND PLAN:  Discussed the following assessment and plan:  Pure hypercholesterolemia On pravastatin.  Tolerating.  Low cholesterol diet and exercise.  Follow lipid panel and  liver function tests.   Lab Results  Component Value Date   CHOL 176 05/07/2020   HDL 58 05/07/2020   LDLCALC 98 05/07/2020   TRIG 113 05/07/2020   CHOLHDL 3.0 05/07/2020    Tobacco abuse Discussed the need to quit smoking.  She declines at this time.  Follow.    Hyperglycemia Low carb diet and exercise.  Follow met b and a1c.   Essential hypertension, benign Blood pressures doing well.  Continue on diltiazem and enalapril.  Follow pressures.  Follow metabolic panel.   Chronic obstructive pulmonary disease (HCC) Has been followed by pulmonary.  Breathing stable.  Continue spiriva.    Atrial fibrillation ([place]) Followed by cardiology.  On eliquis.  No increased heart rate or palpitations.  Follow.    Stress Increased stress as outlined.  Has good support.  Does not feel  needs anything more at this time.  Follow.     Orders Placed This Encounter  Procedures  . Hemoglobin A1c    Standing Status:   Future    Standing Expiration Date:   05/18/2021  . Hepatic function panel    Standing Status:   Future    Standing Expiration Date:   05/18/2021  . Lipid panel    Standing Status:   Future    Standing Expiration Date:   05/18/2021  . Basic metabolic panel    Standing Status:   Future    Standing Expiration Date:   05/18/2021     I discussed the assessment and treatment plan with the patient. The patient was provided an opportunity to ask questions and all were answered. The patient agreed with the plan and demonstrated an understanding of the instructions.   The patient was advised to call back or seek an in-person evaluation if the symptoms worsen or if the condition fails to improve as anticipated.  I provided 23 minutes of non-face-to-face time during this encounter.   Einar Pheasant, MD

## 2020-05-18 ENCOUNTER — Encounter: Payer: Self-pay | Admitting: Internal Medicine

## 2020-05-18 DIAGNOSIS — F439 Reaction to severe stress, unspecified: Secondary | ICD-10-CM | POA: Insufficient documentation

## 2020-05-18 NOTE — Assessment & Plan Note (Signed)
Increased stress as outlined.  Has good support.  Does not feel needs anything more at this time.  Follow.

## 2020-05-18 NOTE — Assessment & Plan Note (Signed)
Low carb diet and exercise.  Follow met b and a1c.  

## 2020-05-18 NOTE — Assessment & Plan Note (Signed)
Has been followed by pulmonary.  Breathing stable. Continue spiriva.    ?

## 2020-05-18 NOTE — Assessment & Plan Note (Signed)
Blood pressures doing well.  Continue on diltiazem and enalapril.  Follow pressures.  Follow metabolic panel.

## 2020-05-18 NOTE — Assessment & Plan Note (Signed)
On pravastatin.  Tolerating.  Low cholesterol diet and exercise.  Follow lipid panel and liver function tests.   Lab Results  Component Value Date   CHOL 176 05/07/2020   HDL 58 05/07/2020   LDLCALC 98 05/07/2020   TRIG 113 05/07/2020   CHOLHDL 3.0 05/07/2020

## 2020-05-18 NOTE — Assessment & Plan Note (Signed)
Discussed the need to quit smoking.  She declines at this time.  Follow.

## 2020-05-18 NOTE — Assessment & Plan Note (Signed)
Followed by cardiology.  On eliquis.  No increased heart rate or palpitations.  Follow.

## 2020-05-22 ENCOUNTER — Other Ambulatory Visit: Payer: Self-pay | Admitting: Internal Medicine

## 2020-06-14 ENCOUNTER — Other Ambulatory Visit: Payer: Self-pay | Admitting: Internal Medicine

## 2020-06-22 ENCOUNTER — Other Ambulatory Visit: Payer: Self-pay | Admitting: Internal Medicine

## 2020-07-17 ENCOUNTER — Other Ambulatory Visit: Payer: Self-pay

## 2020-07-17 ENCOUNTER — Ambulatory Visit: Payer: PPO | Admitting: Dermatology

## 2020-07-17 DIAGNOSIS — L821 Other seborrheic keratosis: Secondary | ICD-10-CM

## 2020-07-17 DIAGNOSIS — Z85828 Personal history of other malignant neoplasm of skin: Secondary | ICD-10-CM

## 2020-07-17 DIAGNOSIS — D1800 Hemangioma unspecified site: Secondary | ICD-10-CM | POA: Diagnosis not present

## 2020-07-17 DIAGNOSIS — L738 Other specified follicular disorders: Secondary | ICD-10-CM | POA: Diagnosis not present

## 2020-07-17 DIAGNOSIS — D229 Melanocytic nevi, unspecified: Secondary | ICD-10-CM | POA: Diagnosis not present

## 2020-07-17 DIAGNOSIS — L578 Other skin changes due to chronic exposure to nonionizing radiation: Secondary | ICD-10-CM

## 2020-07-17 DIAGNOSIS — T148XXA Other injury of unspecified body region, initial encounter: Secondary | ICD-10-CM | POA: Diagnosis not present

## 2020-07-17 DIAGNOSIS — Z1283 Encounter for screening for malignant neoplasm of skin: Secondary | ICD-10-CM | POA: Diagnosis not present

## 2020-07-17 DIAGNOSIS — L814 Other melanin hyperpigmentation: Secondary | ICD-10-CM | POA: Diagnosis not present

## 2020-07-17 NOTE — Progress Notes (Signed)
   Follow-Up Visit   Subjective  Danielle Yates is a 79 y.o. female who presents for the following: full body skin exam and skin cancer screening  Patient here today for TBSE. She has a history of superficial BCC at upper back. Nothing new or changing today that patient is aware of.   The following portions of the chart were reviewed this encounter and updated as appropriate:  Tobacco  Allergies  Meds  Problems  Med Hx  Surg Hx  Fam Hx      Review of Systems:  No other skin or systemic complaints except as noted in HPI or Assessment and Plan.  Objective  Well appearing patient in no apparent distress; mood and affect are within normal limits.  A full examination was performed including scalp, head, eyes, ears, nose, lips, neck, chest, axillae, abdomen, back, buttocks, bilateral upper extremities, bilateral lower extremities, hands, feet, fingers, toes, fingernails, and toenails. All findings within normal limits unless otherwise noted below.  Objective  Right conchal bowl: Excoriation    Assessment & Plan  Excoriation Right conchal bowl  Benign-appearing.  Observation.  Call clinic if this does not resolve or for new or changing lesions.  Recommend daily use of broad spectrum spf 30+ sunscreen to sun-exposed areas.     History of Basal Cell Carcinoma of the Skin - No evidence of recurrence today upper back right of midline - Recommend regular full body skin exams - Recommend daily broad spectrum sunscreen SPF 30+ to sun-exposed areas, reapply every 2 hours as needed.  - Call if any new or changing lesions are noted between office visits  Lentigines - Scattered tan macules - Discussed due to sun exposure - Benign, observe - Call for any changes  Seborrheic Keratoses - Stuck-on, waxy, tan-brown papules and plaques  - Discussed benign etiology and prognosis. - Observe - Call for any changes  Melanocytic Nevi - Tan-brown and/or pink-flesh-colored symmetric macules  and papules - Benign appearing on exam today - Observation - Call clinic for new or changing moles - Recommend daily use of broad spectrum spf 30+ sunscreen to sun-exposed areas.   Hemangiomas - Red papules - Discussed benign nature - Observe - Call for any changes  Actinic Damage - diffuse scaly erythematous macules with underlying dyspigmentation - Recommend daily broad spectrum sunscreen SPF 30+ to sun-exposed areas, reapply every 2 hours as needed.  - Call for new or changing lesions.  Skin cancer screening performed today.  Sebaceous Hyperplasia - Small yellow papules with a central dell at forehead - Benign - Observe  Return in about 6 months (around 01/17/2021) for TBSE.  Graciella Belton, RMA, am acting as scribe for Forest Gleason, MD .  Documentation: I have reviewed the above documentation for accuracy and completeness, and I agree with the above.  Forest Gleason, MD

## 2020-07-17 NOTE — Patient Instructions (Signed)

## 2020-07-27 ENCOUNTER — Encounter: Payer: Self-pay | Admitting: Dermatology

## 2020-08-05 DIAGNOSIS — H353211 Exudative age-related macular degeneration, right eye, with active choroidal neovascularization: Secondary | ICD-10-CM | POA: Diagnosis not present

## 2020-08-07 ENCOUNTER — Ambulatory Visit: Payer: PPO | Admitting: Cardiovascular Disease

## 2020-08-07 ENCOUNTER — Other Ambulatory Visit: Payer: Self-pay

## 2020-08-07 ENCOUNTER — Encounter: Payer: Self-pay | Admitting: Cardiovascular Disease

## 2020-08-07 VITALS — BP 142/60 | HR 88 | Ht 64.0 in | Wt 238.0 lb

## 2020-08-07 DIAGNOSIS — Z720 Tobacco use: Secondary | ICD-10-CM

## 2020-08-07 DIAGNOSIS — I10 Essential (primary) hypertension: Secondary | ICD-10-CM | POA: Diagnosis not present

## 2020-08-07 DIAGNOSIS — E7800 Pure hypercholesterolemia, unspecified: Secondary | ICD-10-CM | POA: Diagnosis not present

## 2020-08-07 DIAGNOSIS — I739 Peripheral vascular disease, unspecified: Secondary | ICD-10-CM | POA: Diagnosis not present

## 2020-08-07 DIAGNOSIS — I4821 Permanent atrial fibrillation: Secondary | ICD-10-CM | POA: Diagnosis not present

## 2020-08-07 MED ORDER — APIXABAN 5 MG PO TABS: 5.0000 mg | ORAL_TABLET | Freq: Two times a day (BID) | ORAL | 0 refills | Status: DC

## 2020-08-07 MED ORDER — TRIAMTERENE-HCTZ 37.5-25 MG PO TABS: 1.0000 | ORAL_TABLET | Freq: Every day | ORAL | 1 refills | Status: DC

## 2020-08-07 MED ORDER — DILTIAZEM HCL ER BEADS 360 MG PO CP24: ORAL_CAPSULE | ORAL | 1 refills | Status: DC

## 2020-08-07 MED ORDER — EZETIMIBE 10 MG PO TABS: 10.0000 mg | ORAL_TABLET | Freq: Every day | ORAL | 1 refills | Status: DC

## 2020-08-07 NOTE — Progress Notes (Signed)
Cardiology Office Note   Date:  08/07/2020   ID:  Danielle, Yates [birth year], MRN [id number]  PCP:  Einar Pheasant, MD  Cardiologist:   Kathlyn Sacramento, MD   Chief Complaint  Patient presents with  . Other    6 month follow up. Meds reviewed verbally with patient.       History of Present Illness: Danielle Yates is a 79 y.o. female who presents for a followup visit regarding chronic atrial fibrillation, refractory hypertension and peripheral arterial disease. She did not tolerate metoprolol due to bradycardia and fatigue. She did not tolerate amlodipine and carvedilol due to headache and dizziness.   She underwent renal duplex ultrasound in 2013 which showed no evidence of renal artery stenosis.   She is known to have bilateral common iliac artery disease with peak velocity around 300 with normal ABI bilaterally. She is being treated medically due to lack of significant claudication. She is taking pravastatin for hyperlipidemia. She did not tolerate other statins in the past.  Echocardiogram in September 2018 showed normal LV systolic function, aortic sclerosis without significant stenosis and mildly dilated left atrium.  She has been doing well with no recent chest pain, worsening dyspnea or palpitations.  No leg claudication.  Unfortunately, her sister died recently from sudden cardiac death.  Past Medical History:  Diagnosis Date  . Allergy   . Aortic valve sclerosis    a. TTE 2013: EF 60-65%, no RWMA, Gr1DD, trivial MR, mildly dilated LA 43 mm; b. TTE 08/2017: EF 65-70%, mild LVH, aortic valve sclerosis without stenosis, mildly dilated LA 36 mm, RV size and sys fxn nl  . Arthropathy, unspecified, site unspecified   . Basal cell carcinoma 06/15/2018   upper back right of midline/superficial  . Cataract   . Chronic rhinitis   . Disorder of bone and cartilage, unspecified   . Diverticulosis of colon (without mention of hemorrhage)   . Essential hypertension    a.  refractory HTN; b. renal US 2013 negative for RAS  . External hemorrhoids without mention of complication   . Medication intolerance   . Obesity, unspecified   . Other abnormal glucose   . Panic disorder without agoraphobia   . Peripheral arterial disease ([place])    a.  Known bilateral common iliac disease-> medical therapy-asymptomatic.  [name] Persistent atrial fibrillation ([place])    a. diagnosed 10/19/17; b. CHADS2VASc 5 (HTN, age x 2, vascular disease, female)-->Eliquis  . Pure hypercholesterolemia   . Tobacco use disorder   . Unspecified vitamin D deficiency     Past Surgical History:  Procedure Laterality Date  . APPENDECTOMY  1985  . CHOLECYSTECTOMY  1985   with open wound  . EYE SURGERY  2011   CATARACTS -  BOTH  . Removed spot on face     [name]  . TONSILLECTOMY AND ADENOIDECTOMY  1946  . VAGINAL HYSTERECTOMY  1982   fibroid and endometriosis; one ovary remaining     Current Outpatient Medications  Medication Sig Dispense Refill  . albuterol (VENTOLIN HFA) 108 (90 Base) MCG/ACT inhaler INHALE 2 PUFFS INTO THE LUNGS EVERY 6 HOURS AS NEEDED FOR WHEEZING OR SHORTNESS OF BREATH 8.5 g 2  . apixaban (ELIQUIS) 5 MG TABS tablet Take 1 tablet (5 mg total) by mouth 2 (two) times daily. 180 tablet 0  . diltiazem (TIAZAC) 360 MG 24 hr capsule TAKE 1 CAPSULE(360 MG) BY MOUTH DAILY 90 capsule 1  . enalapril (VASOTEC) 20 MG tablet TAKE  1 TABLET BY MOUTH EVERY MORNING AND 2 TABLETS IN THE AFTERNOON 270 tablet 3  . FLUoxetine (PROZAC) 20 MG capsule TAKE 1 CAPSULE(20 MG) BY MOUTH DAILY 90 capsule 1  . fluticasone (FLONASE) 50 MCG/ACT nasal spray SHAKE LIQUID AND USE 2 SPRAYS IN EACH NOSTRIL DAILY 48 g 2  . Multiple Vitamins-Minerals (PRESERVISION AREDS) CAPS     . pravastatin (PRAVACHOL) 10 MG tablet TAKE 1 TABLET(10 MG) BY MOUTH DAILY 90 tablet 3  . Probiotic Product (PROBIOTIC DAILY PO) Take 1 tablet by mouth.    . Tiotropium Bromide Monohydrate (SPIRIVA RESPIMAT) 1.25 MCG/ACT AERS Inhale 2  puffs into the lungs daily. 4 g 6  . triamterene-hydrochlorothiazide (MAXZIDE-25) 37.5-25 MG tablet Take 1 tablet by mouth daily. 90 tablet 1  . VITAMIN D, CHOLECALCIFEROL, PO Take by mouth daily.    [name] ezetimibe (ZETIA) 10 MG tablet Take 1 tablet (10 mg total) by mouth daily. 90 tablet 1   No current facility-administered medications for this visit.    Allergies:   Amlodipine, Codeine, Coreg [carvedilol], Crestor [rosuvastatin], Metoprolol, Statins, and Nickel    Social History:  The patient  reports that she has been smoking cigarettes. She has a 25.00 pack-year smoking history. She has never used smokeless tobacco. She reports that she does not drink alcohol and does not use drugs.   Family History:  The patient's family history includes Aneurysm in her mother; Cancer in her brother; Heart disease in her brother and father; Hyperlipidemia in her brother; Hypertension in her brother and sister; Rheumatic fever in her mother.    ROS:  Please see the history of present illness.   Otherwise, review of systems are positive for none.   All other systems are reviewed and negative.    PHYSICAL EXAM: VS:  BP (!) 142/60 (BP Location: Left Arm, Patient Position: Sitting, Cuff Size: Normal)   Pulse 88   Ht 5\' 4"  (1.626 m)   Wt 238 lb (108 kg)   BMI 40.85 kg/m  , BMI Body mass index is 40.85 kg/m. GEN: Well nourished, well developed, in no acute distress  HEENT: normal  Neck: no JVD, carotid bruits, or masses Cardiac: Irregularly irregular; 2/6 systolic ejection murmur in the aortic area. No rubs, gallops or edema  Respiratory:  clear to auscultation bilaterally, normal work of breathing GI: soft, nontender, nondistended, + BS MS: no deformity or atrophy  Skin: warm and dry, no rash Neuro:  Strength and sensation are intact Psych: euthymic mood, full affect Dorsalis pedis is normal bilaterally.  EKG:  EKG is ordered today. The ekg ordered today demonstrates atrial fibrillation with a  ventricular rate of 88 bpm.  Low voltage with nonspecific T wave changes.   Recent Labs: 01/07/2020: Hemoglobin 14.9; Platelets 242; TSH 3.440 05/07/2020: ALT 17; BUN 16; Creatinine, Ser 0.87; Potassium 3.8; Sodium 139    Lipid Panel    Component Value Date/Time   CHOL 176 05/07/2020 0834   TRIG 113 05/07/2020 0834   HDL 58 05/07/2020 0834   CHOLHDL 3.0 05/07/2020 0834   LDLCALC 98 05/07/2020 0834      Wt Readings from Last 3 Encounters:  08/07/20 238 lb (108 kg)  05/13/20 233 lb (105.7 kg)  01/30/20 242 lb 8 oz (110 kg)       No flowsheet data found.    ASSESSMENT AND PLAN:  1.  Permanent atrial fibrillation: Ventricular rate is controlled on diltiazem.  She is on long-term anticoagulation with Eliquis.  Recent labs were unremarkable.  2. Peripheral arterial disease: Mainly with disease affecting the common iliac arteries bilaterally. The patient has palpable distal pulses and has no significant claudication at the present time. Recommend continuing medical therapy.  3.  Aortic sclerosis: No significant change in her cardiac murmur  4. Essential hypertension: Blood pressure is reasonably controlled  5.  Hyperlipidemia: Currently on low-dose pravastatin due to intolerance to other statins.  We should try to get her LDL below 70.  I elected to add Zetia 10 mg daily.  6. Tobacco use: I discussed with her again the importance of smoking cessation.    Disposition:   FU with me in 6 months.  Signed,  Kathlyn Sacramento, MD  08/07/2020 11:03 AM    [place]

## 2020-08-07 NOTE — Patient Instructions (Signed)
Medication Instructions:  Your physician has recommended you make the following change in your medication:   START Zetia 10 mg daily. An Rx has been sent to your pharmacy.  *If you need a refill on your cardiac medications before your next appointment, please call your pharmacy*   Lab Work: None ordered If you have labs (blood work) drawn today and your tests are completely normal, you will receive your results only by: [name] MyChart Message (if you have MyChart) OR . A paper copy in the mail If you have any lab test that is abnormal or we need to change your treatment, we will call you to review the results.   Testing/Procedures: None ordered   Follow-Up: At [facility], you and your health needs are our priority.  As part of our continuing mission to provide you with exceptional heart care, we have created designated Provider Care Teams.  These Care Teams include your primary Cardiologist (physician) and Advanced Practice Providers (APPs -  Physician Assistants and Nurse Practitioners) who all work together to provide you with the care you need, when you need it.  We recommend signing up for the patient portal called "MyChart".  Sign up information is provided on this After Visit Summary.  MyChart is used to connect with patients for Virtual Visits (Telemedicine).  Patients are able to view lab/test results, encounter notes, upcoming appointments, etc.  Non-urgent messages can be sent to your provider as well.   To learn more about what you can do with MyChart, go to [email or web address].    Your next appointment:   6 month(s)  The format for your next appointment:   In Person  Provider:    You may see Kathlyn Sacramento, MD or one of the following Advanced Practice Providers on your designated Care Team:    Murray Hodgkins, NP  Christell Faith, PA-C  Marrianne Mood, PA-C    Other Instructions N/A

## 2020-08-19 DIAGNOSIS — H353211 Exudative age-related macular degeneration, right eye, with active choroidal neovascularization: Secondary | ICD-10-CM | POA: Diagnosis not present

## 2020-09-02 ENCOUNTER — Telehealth: Payer: Self-pay | Admitting: Internal Medicine

## 2020-09-02 DIAGNOSIS — R739 Hyperglycemia, unspecified: Secondary | ICD-10-CM

## 2020-09-02 DIAGNOSIS — I10 Essential (primary) hypertension: Secondary | ICD-10-CM

## 2020-09-02 DIAGNOSIS — E7800 Pure hypercholesterolemia, unspecified: Secondary | ICD-10-CM

## 2020-09-02 NOTE — Telephone Encounter (Signed)
I don't see where she needs anything rechecked. Are we just doing routine fasting labs? I can send them to [facility].

## 2020-09-02 NOTE — Telephone Encounter (Signed)
Pt would like her lab order for October sent to [facility] for them collect.

## 2020-09-02 NOTE — Telephone Encounter (Signed)
Lab orders placed for [facility]

## 2020-09-05 DIAGNOSIS — Z1231 Encounter for screening mammogram for malignant neoplasm of breast: Secondary | ICD-10-CM | POA: Diagnosis not present

## 2020-09-05 LAB — HM MAMMOGRAPHY

## 2020-09-16 ENCOUNTER — Other Ambulatory Visit: Payer: PPO

## 2020-09-16 DIAGNOSIS — R739 Hyperglycemia, unspecified: Secondary | ICD-10-CM | POA: Diagnosis not present

## 2020-09-16 DIAGNOSIS — E7800 Pure hypercholesterolemia, unspecified: Secondary | ICD-10-CM | POA: Diagnosis not present

## 2020-09-16 DIAGNOSIS — I10 Essential (primary) hypertension: Secondary | ICD-10-CM | POA: Diagnosis not present

## 2020-09-17 LAB — BASIC METABOLIC PANEL
BUN/Creatinine Ratio: 16 (ref 12–28)
BUN: 14 mg/dL (ref 8–27)
CO2: 23 mmol/L (ref 20–29)
Calcium: 9.6 mg/dL (ref 8.7–10.3)
Chloride: 99 mmol/L (ref 96–106)
Creatinine, Ser: 0.85 mg/dL (ref 0.57–1.00)
GFR calc Af Amer: 75 mL/min/{1.73_m2} (ref >59)
GFR calc non Af Amer: 65 mL/min/{1.73_m2} (ref >59)
Glucose: 105 mg/dL — ABNORMAL HIGH (ref 65–99)
Potassium: 3.8 mmol/L (ref 3.5–5.2)
Sodium: 140 mmol/L (ref 134–144)

## 2020-09-17 LAB — HEPATIC FUNCTION PANEL
ALT: 11 IU/L (ref 0–32)
AST: 17 IU/L (ref 0–40)
Albumin: 4.6 g/dL (ref 3.7–4.7)
Alkaline Phosphatase: 65 IU/L (ref 44–121)
Bilirubin Total: 1.2 mg/dL (ref 0.0–1.2)
Bilirubin, Direct: 0.28 mg/dL (ref 0.00–0.40)
Total Protein: 7.4 g/dL (ref 6.0–8.5)

## 2020-09-17 LAB — LIPID PANEL
Chol/HDL Ratio: 2.8 ratio (ref 0.0–4.4)
Cholesterol, Total: 180 mg/dL (ref 100–199)
HDL: 65 mg/dL (ref >39)
LDL Chol Calc (NIH): 99 mg/dL (ref 0–99)
Triglycerides: 90 mg/dL (ref 0–149)
VLDL Cholesterol Cal: 16 mg/dL (ref 5–40)

## 2020-09-17 LAB — HEMOGLOBIN A1C
Est. average glucose Bld gHb Est-mCnc: 114 mg/dL
Hgb A1c MFr Bld: 5.6 % (ref 4.8–5.6)

## 2020-09-18 ENCOUNTER — Other Ambulatory Visit: Payer: Self-pay

## 2020-09-18 ENCOUNTER — Ambulatory Visit (INDEPENDENT_AMBULATORY_CARE_PROVIDER_SITE_OTHER): Payer: PPO | Admitting: Internal Medicine

## 2020-09-18 DIAGNOSIS — E7800 Pure hypercholesterolemia, unspecified: Secondary | ICD-10-CM

## 2020-09-18 DIAGNOSIS — Z720 Tobacco use: Secondary | ICD-10-CM

## 2020-09-18 DIAGNOSIS — R739 Hyperglycemia, unspecified: Secondary | ICD-10-CM | POA: Diagnosis not present

## 2020-09-18 DIAGNOSIS — I10 Essential (primary) hypertension: Secondary | ICD-10-CM

## 2020-09-18 DIAGNOSIS — J449 Chronic obstructive pulmonary disease, unspecified: Secondary | ICD-10-CM | POA: Diagnosis not present

## 2020-09-18 DIAGNOSIS — Z6841 Body Mass Index (BMI) 40.0 and over, adult: Secondary | ICD-10-CM

## 2020-09-18 DIAGNOSIS — F439 Reaction to severe stress, unspecified: Secondary | ICD-10-CM | POA: Diagnosis not present

## 2020-09-18 DIAGNOSIS — I4821 Permanent atrial fibrillation: Secondary | ICD-10-CM

## 2020-09-18 MED ORDER — SPIRIVA RESPIMAT 1.25 MCG/ACT IN AERS
2.0000 | INHALATION_SPRAY | Freq: Every day | RESPIRATORY_TRACT | 6 refills | Status: DC
Start: 2020-09-18 — End: 2021-05-15

## 2020-09-18 NOTE — Progress Notes (Signed)
Patient ID: Danielle Yates, female   DOB: May 13, 1941, 79 y.o.   MRN: [id number]   Subjective:    Patient ID: Danielle Yates, female    DOB: 1941-07-24, 79 y.o.   MRN: [id number]  HPI This visit occurred during the SARS-CoV-2 public health emergency.  Safety protocols were in place, including screening questions prior to the visit, additional usage of staff PPE, and extensive cleaning of exam room while observing appropriate contact time as indicated for disinfecting solutions.  Patient here for a scheduled follow up.  She reports she is doing relatively well.  Increased stress recently with her sister passing.  Overall she feels she is handling things relatively well.  Has good support.  Breathing stable.  Smoking <1/2 ppd.  Trying to keep at this amount and no more.   No chest pain.  No increased cough or congestion.  Bowels moving.  Using spiriva.  Saw cardiology 08/07/20 - stable.  Added zetia.  Did not tolerate.     Past Medical History:  Diagnosis Date  . Allergy   . Aortic valve sclerosis    a. TTE 2013: EF 60-65%, no RWMA, Gr1DD, trivial MR, mildly dilated LA 43 mm; b. TTE 08/2017: EF 65-70%, mild LVH, aortic valve sclerosis without stenosis, mildly dilated LA 36 mm, RV size and sys fxn nl  . Arthropathy, unspecified, site unspecified   . Basal cell carcinoma 06/15/2018   upper back right of midline/superficial  . Cataract   . Chronic rhinitis   . Disorder of bone and cartilage, unspecified   . Diverticulosis of colon (without mention of hemorrhage)   . Essential hypertension    a. refractory HTN; b. renal US 2013 negative for RAS  . External hemorrhoids without mention of complication   . Medication intolerance   . Obesity, unspecified   . Other abnormal glucose   . Panic disorder without agoraphobia   . Peripheral arterial disease ([place])    a.  Known bilateral common iliac disease-> medical therapy-asymptomatic.  [name] Persistent atrial fibrillation ([place])    a. diagnosed 10/19/17;  b. CHADS2VASc 5 (HTN, age x 2, vascular disease, female)-->Eliquis  . Pure hypercholesterolemia   . Tobacco use disorder   . Unspecified vitamin D deficiency    Past Surgical History:  Procedure Laterality Date  . APPENDECTOMY  1985  . CHOLECYSTECTOMY  1985   with open wound  . EYE SURGERY  2011   CATARACTS -  BOTH  . Removed spot on face     [name]  . TONSILLECTOMY AND ADENOIDECTOMY  1946  . VAGINAL HYSTERECTOMY  1982   fibroid and endometriosis; one ovary remaining   Family History  Problem Relation Age of Onset  . Hypertension Sister   . Heart disease Brother   . Hypertension Brother   . Hyperlipidemia Brother   . Cancer Brother   . Aneurysm Mother   . Rheumatic fever Mother        as an adult  . Heart disease Father        MI x 2   Social History   Socioeconomic History  . Marital status: Widowed    Spouse name: Not on file  . Number of children: 0  . Years of education: college  . Highest education level: Not on file  Occupational History  . Occupation: Estate manager/land agent    Comment: [employer] x 20 years  Tobacco Use  . Smoking status: Current Every Day Smoker    Packs/day: 0.50  Years: 50.00    Pack years: 25.00    Types: Cigarettes  . Smokeless tobacco: Never Used  . Tobacco comment: 10 a day  Vaping Use  . Vaping Use: Never used  Substance and Sexual Activity  . Alcohol use: No    Alcohol/week: 0.0 standard drinks  . Drug use: No  . Sexual activity: Never  Other Topics Concern  . Not on file  Social History Narrative   Lives alone. Widowed after 23 years. Not dating. Has dog. Exercise: Walking; light has not been walking as she used to before her 2 dogs passed away. Tries to walk some. Caffeine: Carbonated beverages, 3 servings/day;Diet coke. 3 a day.      10/02/12  PER PATIENT'S LAVENDER FORM: WALK 5 DAYS/WEEK FOR 30 MINUTES   Social Determinants of Health   Financial Resource Strain:   . Difficulty of Paying Living Expenses: Not on file    Food Insecurity:   . Worried About Charity fundraiser in the Last Year: Not on file  . Ran Out of Food in the Last Year: Not on file  Transportation Needs:   . Lack of Transportation (Medical): Not on file  . Lack of Transportation (Non-Medical): Not on file  Physical Activity:   . Days of Exercise per Week: Not on file  . Minutes of Exercise per Session: Not on file  Stress:   . Feeling of Stress : Not on file  Social Connections:   . Frequency of Communication with Friends and Family: Not on file  . Frequency of Social Gatherings with Friends and Family: Not on file  . Attends Religious Services: Not on file  . Active Member of Clubs or Organizations: Not on file  . Attends Archivist Meetings: Not on file  . Marital Status: Not on file    Outpatient Encounter Medications as of 09/18/2020  Medication Sig  . albuterol (VENTOLIN HFA) 108 (90 Base) MCG/ACT inhaler INHALE 2 PUFFS INTO THE LUNGS EVERY 6 HOURS AS NEEDED FOR WHEEZING OR SHORTNESS OF BREATH  . apixaban (ELIQUIS) 5 MG TABS tablet Take 1 tablet (5 mg total) by mouth 2 (two) times daily.  [name] diltiazem (TIAZAC) 360 MG 24 hr capsule TAKE 1 CAPSULE(360 MG) BY MOUTH DAILY  . enalapril (VASOTEC) 20 MG tablet TAKE 1 TABLET BY MOUTH EVERY MORNING AND 2 TABLETS IN THE AFTERNOON  . ezetimibe (ZETIA) 10 MG tablet Take 1 tablet (10 mg total) by mouth daily.  [name] FLUoxetine (PROZAC) 20 MG capsule TAKE 1 CAPSULE(20 MG) BY MOUTH DAILY  . fluticasone (FLONASE) 50 MCG/ACT nasal spray SHAKE LIQUID AND USE 2 SPRAYS IN EACH NOSTRIL DAILY  . Multiple Vitamins-Minerals (PRESERVISION AREDS) CAPS   . pravastatin (PRAVACHOL) 10 MG tablet TAKE 1 TABLET(10 MG) BY MOUTH DAILY  . Probiotic Product (PROBIOTIC DAILY PO) Take 1 tablet by mouth.  . Tiotropium Bromide Monohydrate (SPIRIVA RESPIMAT) 1.25 MCG/ACT AERS Inhale 2 puffs into the lungs daily.  [name] triamterene-hydrochlorothiazide (MAXZIDE-25) 37.5-25 MG tablet Take 1 tablet by mouth daily.   [name] VITAMIN D, CHOLECALCIFEROL, PO Take by mouth daily.  . [DISCONTINUED] Tiotropium Bromide Monohydrate (SPIRIVA RESPIMAT) 1.25 MCG/ACT AERS Inhale 2 puffs into the lungs daily.   No facility-administered encounter medications on file as of 09/18/2020.    Review of Systems  Constitutional: Negative for appetite change and unexpected weight change.  HENT: Negative for congestion and sinus pressure.   Respiratory: Negative for cough, chest tightness and shortness of breath.   Cardiovascular: Negative  for chest pain, palpitations and leg swelling.  Gastrointestinal: Negative for abdominal pain, diarrhea and nausea.  Genitourinary: Negative for difficulty urinating and dysuria.  Musculoskeletal: Negative for joint swelling and myalgias.  Skin: Negative for color change and rash.  Neurological: Negative for dizziness, light-headedness and headaches.  Psychiatric/Behavioral: Negative for agitation and dysphoric mood.       Objective:    Physical Exam Vitals reviewed.  Constitutional:      General: She is not in acute distress.    Appearance: Normal appearance.  HENT:     Head: Normocephalic and atraumatic.     Right Ear: External ear normal.     Left Ear: External ear normal.  Eyes:     General: No scleral icterus.       Right eye: No discharge.        Left eye: No discharge.     Conjunctiva/sclera: Conjunctivae normal.  Neck:     Thyroid: No thyromegaly.  Cardiovascular:     Rate and Rhythm: Normal rate.     Comments: Rate controlled.  Pulmonary:     Effort: No respiratory distress.     Breath sounds: Normal breath sounds. No wheezing.  Abdominal:     General: Bowel sounds are normal.     Palpations: Abdomen is soft.     Tenderness: There is no abdominal tenderness.  Musculoskeletal:        General: No swelling or tenderness.     Cervical back: Neck supple. No tenderness.  Lymphadenopathy:     Cervical: No cervical adenopathy.  Skin:    Findings: No erythema or rash.   Neurological:     Mental Status: She is alert.  Psychiatric:        Mood and Affect: Mood normal.        Behavior: Behavior normal.     BP 124/72   Pulse 85   Temp (!) 97.5 F (36.4 C) (Oral)   Resp 16   Ht _0  (1.626 m)   Wt 238 lb 9.6 oz (108.2 kg)   SpO2 98%   BMI 40.96 kg/m  Wt Readings from Last 3 Encounters:  09/18/20 238 lb 9.6 oz (108.2 kg)  08/07/20 238 lb (108 kg)  05/13/20 233 lb (105.7 kg)     Lab Results  Component Value Date   WBC 6.3 01/07/2020   HGB 14.9 01/07/2020   HCT 44.8 01/07/2020   PLT 242 01/07/2020   GLUCOSE 105 (H) 09/16/2020   CHOL 180 09/16/2020   TRIG 90 09/16/2020   HDL 65 09/16/2020   LDLCALC 99 09/16/2020   ALT 11 09/16/2020   AST 17 09/16/2020   NA 140 09/16/2020   K 3.8 09/16/2020   CL 99 09/16/2020   CREATININE 0.85 09/16/2020   BUN 14 09/16/2020   CO2 23 09/16/2020   TSH 3.440 01/07/2020   HGBA1C 5.6 09/16/2020       Assessment & Plan:   Problem List Items Addressed This Visit    Tobacco abuse    Discussed the need to quit smoking.  She declines to quit.  She is trying to keep <1/2 ppd.  Follow.       Stress    Increased stress as outlined.  Has good support.  Does not feel needs any further intervention.  Follow.       Pure hypercholesterolemia    On pravastatin. Did not tolerate addition of zetia.  Low cholesterol diet and exercise. Follow lipid panel.  Relevant Orders   Hepatic function panel   Lipid panel   Hyperglycemia    Low carb diet and exercise.  Follow met b and a1c       Relevant Orders   Hemoglobin A1c   Essential hypertension, benign    Blood pressure doing well.  Continue diltiazem and enalapril.  Follow pressures.  Follow metabolic panel.       Relevant Orders   CBC with Differential/Platelet   TSH   Basic metabolic panel   Chronic obstructive pulmonary disease ([place])    Has been followed by pulmonary.  Breathing stable.       Relevant Medications   Tiotropium Bromide  Monohydrate (SPIRIVA RESPIMAT) 1.25 MCG/ACT AERS   BMI 40.0-44.9, adult ([facility])    Diet and exercise.       Atrial fibrillation ([facility])    No increased heart rate or palpitations.  On eliquis.  Followed by cardiology.           Einar Pheasant, MD

## 2020-09-22 ENCOUNTER — Encounter: Payer: Self-pay | Admitting: Internal Medicine

## 2020-09-22 NOTE — Assessment & Plan Note (Signed)
Increased stress as outlined.  Has good support.  Does not feel needs any further intervention.  Follow.

## 2020-09-22 NOTE — Assessment & Plan Note (Signed)
Diet and exercise.   

## 2020-09-22 NOTE — Assessment & Plan Note (Signed)
On pravastatin. Did not tolerate addition of zetia.  Low cholesterol diet and exercise. Follow lipid panel.

## 2020-09-22 NOTE — Assessment & Plan Note (Signed)
Low carb diet and exercise.  Follow met b and a1c.   

## 2020-09-22 NOTE — Assessment & Plan Note (Signed)
Discussed the need to quit smoking.  She declines to quit.  She is trying to keep <1/2 ppd.  Follow.

## 2020-09-22 NOTE — Assessment & Plan Note (Signed)
No increased heart rate or palpitations.  On eliquis.  Followed by cardiology.

## 2020-09-22 NOTE — Assessment & Plan Note (Signed)
Has been followed by pulmonary.  Breathing stable.  

## 2020-09-22 NOTE — Assessment & Plan Note (Signed)
Blood pressure doing well.  Continue diltiazem and enalapril.  Follow pressures.  Follow metabolic panel.

## 2020-10-06 ENCOUNTER — Other Ambulatory Visit: Payer: Self-pay | Admitting: Physician Assistant

## 2020-10-06 NOTE — Telephone Encounter (Signed)
Rx request sent to pharmacy.  

## 2020-11-03 ENCOUNTER — Other Ambulatory Visit: Payer: Self-pay | Admitting: Cardiovascular Disease

## 2020-11-03 NOTE — Telephone Encounter (Signed)
Refill Request.  

## 2020-11-03 NOTE — Telephone Encounter (Signed)
Pt's age 79, wt 108.2, SCr 0.85, CrCl 91.67, last ov w/ MA 08/07/20.

## 2020-11-08 ENCOUNTER — Other Ambulatory Visit: Payer: Self-pay | Admitting: Internal Medicine

## 2020-12-01 DIAGNOSIS — H353211 Exudative age-related macular degeneration, right eye, with active choroidal neovascularization: Secondary | ICD-10-CM | POA: Diagnosis not present

## 2020-12-04 ENCOUNTER — Other Ambulatory Visit: Payer: Self-pay | Admitting: Internal Medicine

## 2021-01-15 ENCOUNTER — Telehealth: Payer: Self-pay

## 2021-01-15 NOTE — Addendum Note (Signed)
Addended by: Lars Masson on: 01/15/2021 01:03 PM   Modules accepted: Orders

## 2021-01-15 NOTE — Telephone Encounter (Signed)
Pt has a cpe coming next week and she needs lab orders sent to [facility] so she can get them done. She would like a call once they have been sent.

## 2021-01-15 NOTE — Telephone Encounter (Signed)
Patient aware labs are placed

## 2021-01-20 DIAGNOSIS — I10 Essential (primary) hypertension: Secondary | ICD-10-CM | POA: Diagnosis not present

## 2021-01-20 DIAGNOSIS — R739 Hyperglycemia, unspecified: Secondary | ICD-10-CM | POA: Diagnosis not present

## 2021-01-20 DIAGNOSIS — E7800 Pure hypercholesterolemia, unspecified: Secondary | ICD-10-CM | POA: Diagnosis not present

## 2021-01-21 ENCOUNTER — Encounter: Payer: Self-pay | Admitting: Dermatology

## 2021-01-21 ENCOUNTER — Ambulatory Visit: Payer: PPO | Admitting: Dermatology

## 2021-01-21 ENCOUNTER — Other Ambulatory Visit: Payer: Self-pay

## 2021-01-21 DIAGNOSIS — L814 Other melanin hyperpigmentation: Secondary | ICD-10-CM | POA: Diagnosis not present

## 2021-01-21 DIAGNOSIS — D1800 Hemangioma unspecified site: Secondary | ICD-10-CM

## 2021-01-21 DIAGNOSIS — L738 Other specified follicular disorders: Secondary | ICD-10-CM | POA: Diagnosis not present

## 2021-01-21 DIAGNOSIS — L821 Other seborrheic keratosis: Secondary | ICD-10-CM | POA: Diagnosis not present

## 2021-01-21 DIAGNOSIS — L578 Other skin changes due to chronic exposure to nonionizing radiation: Secondary | ICD-10-CM | POA: Diagnosis not present

## 2021-01-21 DIAGNOSIS — Z85828 Personal history of other malignant neoplasm of skin: Secondary | ICD-10-CM

## 2021-01-21 DIAGNOSIS — Z1283 Encounter for screening for malignant neoplasm of skin: Secondary | ICD-10-CM

## 2021-01-21 DIAGNOSIS — L820 Inflamed seborrheic keratosis: Secondary | ICD-10-CM

## 2021-01-21 DIAGNOSIS — L720 Epidermal cyst: Secondary | ICD-10-CM | POA: Diagnosis not present

## 2021-01-21 DIAGNOSIS — D229 Melanocytic nevi, unspecified: Secondary | ICD-10-CM

## 2021-01-21 LAB — BASIC METABOLIC PANEL
BUN/Creatinine Ratio: 15 (ref 12–28)
BUN: 14 mg/dL (ref 8–27)
CO2: 22 mmol/L (ref 20–29)
Calcium: 9.5 mg/dL (ref 8.7–10.3)
Chloride: 97 mmol/L (ref 96–106)
Creatinine, Ser: 0.93 mg/dL (ref 0.57–1.00)
GFR calc Af Amer: 68 mL/min/{1.73_m2} (ref >59)
GFR calc non Af Amer: 59 mL/min/{1.73_m2} — ABNORMAL LOW (ref >59)
Glucose: 106 mg/dL — ABNORMAL HIGH (ref 65–99)
Potassium: 3.9 mmol/L (ref 3.5–5.2)
Sodium: 139 mmol/L (ref 134–144)

## 2021-01-21 LAB — CBC WITH DIFFERENTIAL/PLATELET
Basophils Absolute: 0.1 10*3/uL (ref 0.0–0.2)
Basos: 1 %
EOS (ABSOLUTE): 0.1 10*3/uL (ref 0.0–0.4)
Eos: 2 %
Hematocrit: 45.4 % (ref 34.0–46.6)
Hemoglobin: 14.7 g/dL (ref 11.1–15.9)
Immature Grans (Abs): 0 10*3/uL (ref 0.0–0.1)
Immature Granulocytes: 0 %
Lymphocytes Absolute: 1.7 10*3/uL (ref 0.7–3.1)
Lymphs: 28 %
MCH: 28.7 pg (ref 26.6–33.0)
MCHC: 32.4 g/dL (ref 31.5–35.7)
MCV: 89 fL (ref 79–97)
Monocytes Absolute: 0.6 10*3/uL (ref 0.1–0.9)
Monocytes: 10 %
Neutrophils Absolute: 3.6 10*3/uL (ref 1.4–7.0)
Neutrophils: 59 %
Platelets: 248 10*3/uL (ref 150–450)
RBC: 5.13 x10E6/uL (ref 3.77–5.28)
RDW: 12.1 % (ref 11.7–15.4)
WBC: 6.1 10*3/uL (ref 3.4–10.8)

## 2021-01-21 LAB — LIPID PANEL
Chol/HDL Ratio: 2.6 ratio (ref 0.0–4.4)
Cholesterol, Total: 163 mg/dL (ref 100–199)
HDL: 62 mg/dL (ref >39)
LDL Chol Calc (NIH): 85 mg/dL (ref 0–99)
Triglycerides: 84 mg/dL (ref 0–149)
VLDL Cholesterol Cal: 16 mg/dL (ref 5–40)

## 2021-01-21 LAB — HEPATIC FUNCTION PANEL
ALT: 11 IU/L (ref 0–32)
AST: 19 IU/L (ref 0–40)
Albumin: 4.4 g/dL (ref 3.7–4.7)
Alkaline Phosphatase: 66 IU/L (ref 44–121)
Bilirubin Total: 1.1 mg/dL (ref 0.0–1.2)
Bilirubin, Direct: 0.29 mg/dL (ref 0.00–0.40)
Total Protein: 6.9 g/dL (ref 6.0–8.5)

## 2021-01-21 LAB — TSH: TSH: 2.56 u[IU]/mL (ref 0.450–4.500)

## 2021-01-21 LAB — HEMOGLOBIN A1C
Est. average glucose Bld gHb Est-mCnc: 117 mg/dL
Hgb A1c MFr Bld: 5.7 % — ABNORMAL HIGH (ref 4.8–5.6)

## 2021-01-21 NOTE — Progress Notes (Signed)
   Follow-Up Visit   Subjective  Danielle Yates is a 80 y.o. female who presents for the following: TBSE (Patient here for full body skin exam and skin cancer screening. Patient with a history of BCC. She does have a spot at right ear that she noticed about 5 months ago. No symptoms with it. ).  Patient does have a spot at right breast that is itchy sometimes.   The following portions of the chart were reviewed this encounter and updated as appropriate:   Tobacco  Allergies  Meds  Problems  Med Hx  Surg Hx  Fam Hx      Review of Systems:  No other skin or systemic complaints except as noted in HPI or Assessment and Plan.  Objective  Well appearing patient in no apparent distress; mood and affect are within normal limits.  A full examination was performed including scalp, head, eyes, ears, nose, lips, neck, chest, axillae, abdomen, back, buttocks, bilateral upper extremities, bilateral lower extremities, hands, feet, fingers, toes, fingernails, and toenails. All findings within normal limits unless otherwise noted below.  Objective  Right Ear: Subcutaneous nodule.   Objective  face: Small yellow papules with a central dell.    Assessment & Plan  Epidermal inclusion cyst Right Ear  Benign-appearing. Exam most consistent with milia or superficial cyst. Call for changes.  Sebaceous hyperplasia face  Benign-appearing.  Observation.  Call clinic for new or changing lesions.  Recommend daily use of broad spectrum spf 30+ sunscreen to sun-exposed areas.     Lentigines - Scattered tan macules - Discussed due to sun exposure - Benign, observe - Call for any changes  Inflamed seborrheic keratosis right breast - Benign-appearing - Patient deferred treatment at this time.  Seborrheic Keratoses - Stuck-on, waxy, tan-brown papules and plaques  - Discussed benign etiology and prognosis. - Observe - Call for any changes  Melanocytic Nevi - Tan-brown and/or  pink-flesh-colored symmetric macules and papules - Benign appearing on exam today - Observation - Call clinic for new or changing moles - Recommend daily use of broad spectrum spf 30+ sunscreen to sun-exposed areas.   Hemangiomas - Red papules - Discussed benign nature - Observe - Call for any changes  Actinic Damage - Chronic, secondary to cumulative UV/sun exposure - diffuse scaly erythematous macules with underlying dyspigmentation - Recommend daily broad spectrum sunscreen SPF 30+ to sun-exposed areas, reapply every 2 hours as needed.  - Call for new or changing lesions.  Skin cancer screening performed today.  History of Basal Cell Carcinoma of the Skin - No evidence of recurrence today at upper back right of midline, superficial - Recommend regular full body skin exams - Recommend daily broad spectrum sunscreen SPF 30+ to sun-exposed areas, reapply every 2 hours as needed.  - Call if any new or changing lesions are noted between office visits - Recommend Nicotinamide 500mg  twice per day to lower risk of non-melanoma skin cancer by approximately 25%.     Return in about 6 months (around 07/21/2021) for TBSE.  Graciella Belton, RMA, am acting as scribe for Forest Gleason, MD .  Documentation: I have reviewed the above documentation for accuracy and completeness, and I agree with the above.  Forest Gleason, MD

## 2021-01-21 NOTE — Patient Instructions (Addendum)
Melanoma ABCDEs  Melanoma is the most dangerous type of skin cancer, and is the leading cause of death from skin disease.  You are more likely to develop melanoma if you:  Have light-colored skin, light-colored eyes, or red or blond hair  Spend a lot of time in the sun  Tan regularly, either outdoors or in a tanning bed  Have had blistering sunburns, especially during childhood  Have a close family member who has had a melanoma  Have atypical moles or large birthmarks  Early detection of melanoma is key since treatment is typically straightforward and cure rates are extremely high if we catch it early.   The first sign of melanoma is often a change in a mole or a new dark spot.  The ABCDE system is a way of remembering the signs of melanoma.  A for asymmetry:  The two halves do not match. B for border:  The edges of the growth are irregular. C for color:  A mixture of colors are present instead of an even brown color. D for diameter:  Melanomas are usually (but not always) greater than 39mm - the size of a pencil eraser. E for evolution:  The spot keeps changing in size, shape, and color.  Please check your skin once per month between visits. You can use a small mirror in front and a large mirror behind you to keep an eye on the back side or your body.   If you see any new or changing lesions before your next follow-up, please call to schedule a visit.  Please continue daily skin protection including broad spectrum sunscreen SPF 30+ to sun-exposed areas, reapplying every 2 hours as needed when you're outdoors.    Gentle Skin Care Guide  1. Bathe no more than once a day.  2. Avoid bathing in hot water  3. Use a mild soap like Dove, Vanicream, Cetaphil, CeraVe. Can use Lever 2000 or Cetaphil antibacterial soap  4. Use soap only where you need it. On most days, use it under your arms, between your legs, and on your feet. Let the water rinse other areas unless visibly dirty.  5.  When you get out of the bath/shower, use a towel to gently blot your skin dry, don't rub it.  6. While your skin is still a little damp, apply a moisturizing cream such as Vanicream, CeraVe, Cetaphil, Eucerin, Sarna lotion or plain Vaseline Jelly. For hands apply Neutrogena [place] Hand Cream or Excipial Hand Cream.  7. Reapply moisturizer any time you start to itch or feel dry.  8. Sometimes using free and clear laundry detergents can be helpful. Fabric softener sheets should be avoided. Downy Free & Gentle liquid, or any liquid fabric softener that is free of dyes and perfumes, it acceptable to use  9. If your doctor has given you prescription creams you may apply moisturizers over them    Recommend over the counter urea cream to feet or consider using BabyFoot.   Recommend taking Heliocare sun protection supplement daily in sunny weather for additional sun protection. For maximum protection on the sunniest days, you can take up to 2 capsules of regular Heliocare OR take 1 capsule of Heliocare Ultra. For prolonged exposure (such as a full day in the sun), you can repeat your dose of the supplement 4 hours after your first dose. Heliocare can be purchased at [facility] or at [email or web address].    Recommend Nicotinamide 500mg  twice per day to lower risk of non-melanoma  skin cancer by approximately 25%.

## 2021-01-22 ENCOUNTER — Encounter: Payer: Self-pay | Admitting: Internal Medicine

## 2021-01-22 ENCOUNTER — Ambulatory Visit (INDEPENDENT_AMBULATORY_CARE_PROVIDER_SITE_OTHER): Payer: PPO | Admitting: Internal Medicine

## 2021-01-22 VITALS — BP 128/72 | HR 85 | Temp 98.0°F | Resp 16 | Ht 64.0 in | Wt 238.0 lb

## 2021-01-22 DIAGNOSIS — F439 Reaction to severe stress, unspecified: Secondary | ICD-10-CM | POA: Diagnosis not present

## 2021-01-22 DIAGNOSIS — Z6841 Body Mass Index (BMI) 40.0 and over, adult: Secondary | ICD-10-CM | POA: Diagnosis not present

## 2021-01-22 DIAGNOSIS — J449 Chronic obstructive pulmonary disease, unspecified: Secondary | ICD-10-CM

## 2021-01-22 DIAGNOSIS — F419 Anxiety disorder, unspecified: Secondary | ICD-10-CM | POA: Diagnosis not present

## 2021-01-22 DIAGNOSIS — Z0000 Encounter for general adult medical examination without abnormal findings: Secondary | ICD-10-CM

## 2021-01-22 DIAGNOSIS — E7800 Pure hypercholesterolemia, unspecified: Secondary | ICD-10-CM | POA: Diagnosis not present

## 2021-01-22 DIAGNOSIS — I739 Peripheral vascular disease, unspecified: Secondary | ICD-10-CM | POA: Diagnosis not present

## 2021-01-22 DIAGNOSIS — R739 Hyperglycemia, unspecified: Secondary | ICD-10-CM

## 2021-01-22 DIAGNOSIS — Z720 Tobacco use: Secondary | ICD-10-CM

## 2021-01-22 DIAGNOSIS — I4821 Permanent atrial fibrillation: Secondary | ICD-10-CM | POA: Diagnosis not present

## 2021-01-22 DIAGNOSIS — I10 Essential (primary) hypertension: Secondary | ICD-10-CM | POA: Diagnosis not present

## 2021-01-22 NOTE — Progress Notes (Signed)
Patient ID: Danielle Yates, female   DOB: Jan 06, 1941, 80 y.o.   MRN: [id number]   Subjective:    Patient ID: Danielle Yates, female    DOB: 1941-06-22, 80 y.o.   MRN: [id number]  HPI This visit occurred during the SARS-CoV-2 public health emergency.  Safety protocols were in place, including screening questions prior to the visit, additional usage of staff PPE, and extensive cleaning of exam room while observing appropriate contact time as indicated for disinfecting solutions.  Patient here for her physical exam.  She is doing relatively well.  Tries to stay active.  Discussed diet and exercise.  No chest pain or sob reported.  No abdominal pain or bowel change reported.  Discussed labs.  a1c 5.7.  Discussed the need for smoking cessation.   Past Medical History:  Diagnosis Date  . Allergy   . Aortic valve sclerosis    a. TTE 2013: EF 60-65%, no RWMA, Gr1DD, trivial MR, mildly dilated LA 43 mm; b. TTE 08/2017: EF 65-70%, mild LVH, aortic valve sclerosis without stenosis, mildly dilated LA 36 mm, RV size and sys fxn nl  . Arthropathy, unspecified, site unspecified   . Basal cell carcinoma 06/15/2018   upper back right of midline/superficial  . Cataract   . Chronic rhinitis   . Disorder of bone and cartilage, unspecified   . Diverticulosis of colon (without mention of hemorrhage)   . Essential hypertension    a. refractory HTN; b. renal US 2013 negative for RAS  . External hemorrhoids without mention of complication   . Medication intolerance   . Obesity, unspecified   . Other abnormal glucose   . Panic disorder without agoraphobia   . Peripheral arterial disease ([place])    a.  Known bilateral common iliac disease-> medical therapy-asymptomatic.  [name] Persistent atrial fibrillation ([place])    a. diagnosed 10/19/17; b. CHADS2VASc 5 (HTN, age x 2, vascular disease, female)-->Eliquis  . Pure hypercholesterolemia   . Tobacco use disorder   . Unspecified vitamin D deficiency    Past Surgical  History:  Procedure Laterality Date  . APPENDECTOMY  1985  . CHOLECYSTECTOMY  1985   with open wound  . EYE SURGERY  2011   CATARACTS -  BOTH  . Removed spot on face     [name]  . TONSILLECTOMY AND ADENOIDECTOMY  1946  . VAGINAL HYSTERECTOMY  1982   fibroid and endometriosis; one ovary remaining   Family History  Problem Relation Age of Onset  . Hypertension Sister   . Heart disease Brother   . Hypertension Brother   . Hyperlipidemia Brother   . Cancer Brother   . Aneurysm Mother   . Rheumatic fever Mother        as an adult  . Heart disease Father        MI x 2   Social History   Socioeconomic History  . Marital status: Widowed    Spouse name: Not on file  . Number of children: 0  . Years of education: college  . Highest education level: Not on file  Occupational History  . Occupation: Estate manager/land agent    Comment: [employer] x 20 years  Tobacco Use  . Smoking status: Current Every Day Smoker    Packs/day: 0.50    Years: 50.00    Pack years: 25.00    Types: Cigarettes  . Smokeless tobacco: Never Used  . Tobacco comment: 10 a day  Vaping Use  . Vaping Use: Never used  Substance and Sexual Activity  . Alcohol use: No    Alcohol/week: 0.0 standard drinks  . Drug use: No  . Sexual activity: Never  Other Topics Concern  . Not on file  Social History Narrative   Lives alone. Widowed after 23 years. Not dating. Has dog. Exercise: Walking; light has not been walking as she used to before her 2 dogs passed away. Tries to walk some. Caffeine: Carbonated beverages, 3 servings/day;Diet coke. 3 a day.      10/02/12  PER PATIENT'S LAVENDER FORM: WALK 5 DAYS/WEEK FOR 30 MINUTES   Social Determinants of Health   Financial Resource Strain: Not on file  Food Insecurity: Not on file  Transportation Needs: Not on file  Physical Activity: Not on file  Stress: Not on file  Social Connections: Not on file    Outpatient Encounter Medications as of 01/22/2021  Medication  Sig  . albuterol (VENTOLIN HFA) 108 (90 Base) MCG/ACT inhaler INHALE 2 PUFFS INTO THE LUNGS EVERY 6 HOURS AS NEEDED FOR WHEEZING OR SHORTNESS OF BREATH  . diltiazem (TIAZAC) 360 MG 24 hr capsule TAKE 1 CAPSULE(360 MG) BY MOUTH DAILY  . ELIQUIS 5 MG TABS tablet TAKE 1 TABLET(5 MG) BY MOUTH TWICE DAILY  . enalapril (VASOTEC) 20 MG tablet TAKE 1 TABLET BY MOUTH EVERY MORNING AND 2 TABLETS BY MOUTH EVERY AFTERNOON  . FLUoxetine (PROZAC) 20 MG capsule TAKE 1 CAPSULE(20 MG) BY MOUTH DAILY  . fluticasone (FLONASE) 50 MCG/ACT nasal spray SHAKE LIQUID AND USE 2 SPRAYS IN EACH NOSTRIL DAILY  . Multiple Vitamins-Minerals (PRESERVISION AREDS) CAPS   . pravastatin (PRAVACHOL) 10 MG tablet TAKE 1 TABLET(10 MG) BY MOUTH DAILY  . Probiotic Product (PROBIOTIC DAILY PO) Take 1 tablet by mouth.  . Tiotropium Bromide Monohydrate (SPIRIVA RESPIMAT) 1.25 MCG/ACT AERS Inhale 2 puffs into the lungs daily.  [name] triamterene-hydrochlorothiazide (MAXZIDE-25) 37.5-25 MG tablet Take 1 tablet by mouth daily.  [name] VITAMIN D, CHOLECALCIFEROL, PO Take by mouth daily.   No facility-administered encounter medications on file as of 01/22/2021.    Review of Systems  Constitutional: Negative for appetite change and unexpected weight change.  HENT: Negative for congestion and sinus pressure.   Respiratory: Negative for cough, chest tightness and shortness of breath.   Cardiovascular: Negative for chest pain, palpitations and leg swelling.  Gastrointestinal: Negative for abdominal pain, diarrhea, nausea and vomiting.  Genitourinary: Negative for difficulty urinating and dysuria.  Musculoskeletal: Negative for joint swelling and myalgias.  Skin: Negative for color change and rash.  Neurological: Negative for dizziness, light-headedness and headaches.  Psychiatric/Behavioral: Negative for agitation and dysphoric mood.       Objective:    Physical Exam Vitals reviewed.  Constitutional:      General: She is not in acute  distress.    Appearance: Normal appearance. She is well-developed and well-nourished.  HENT:     Head: Normocephalic and atraumatic.     Right Ear: External ear normal.     Left Ear: External ear normal.     Mouth/Throat:     Mouth: Oropharynx is clear and moist.  Eyes:     General: No scleral icterus.       Right eye: No discharge.        Left eye: No discharge.     Conjunctiva/sclera: Conjunctivae normal.  Neck:     Thyroid: No thyromegaly.  Cardiovascular:     Rate and Rhythm: Normal rate and regular rhythm.  Pulmonary:     Effort: No tachypnea,  accessory muscle usage or respiratory distress.     Breath sounds: Normal breath sounds. No decreased breath sounds or wheezing.  Chest:  Breasts:     Right: No inverted nipple, mass, nipple discharge or tenderness (no axillary adenopathy).     Left: No inverted nipple, mass, nipple discharge or tenderness (no axilarry adenopathy).    Abdominal:     General: Bowel sounds are normal.     Palpations: Abdomen is soft.     Tenderness: There is no abdominal tenderness.  Musculoskeletal:        General: No swelling, tenderness or edema.     Cervical back: Neck supple. No tenderness.  Lymphadenopathy:     Cervical: No cervical adenopathy.  Skin:    Findings: No erythema or rash.  Neurological:     Mental Status: She is alert and oriented to person, place, and time.  Psychiatric:        Mood and Affect: Mood and affect and mood normal.        Behavior: Behavior normal.     BP 128/72   Pulse 85   Temp 98 F (36.7 C) (Oral)   Resp 16   Ht 5' 4" (1.626 m)   Wt 238 lb (108 kg)   SpO2 97%   BMI 40.85 kg/m  Wt Readings from Last 3 Encounters:  01/22/21 238 lb (108 kg)  09/18/20 238 lb 9.6 oz (108.2 kg)  08/07/20 238 lb (108 kg)     Lab Results  Component Value Date   WBC 6.1 01/20/2021   HGB 14.7 01/20/2021   HCT 45.4 01/20/2021   PLT 248 01/20/2021   GLUCOSE 106 (H) 01/20/2021   CHOL 163 01/20/2021   TRIG 84  01/20/2021   HDL 62 01/20/2021   LDLCALC 85 01/20/2021   ALT 11 01/20/2021   AST 19 01/20/2021   NA 139 01/20/2021   K 3.9 01/20/2021   CL 97 01/20/2021   CREATININE 0.93 01/20/2021   BUN 14 01/20/2021   CO2 22 01/20/2021   TSH 2.560 01/20/2021   HGBA1C 5.7 (H) 01/20/2021       Assessment & Plan:   Problem List Items Addressed This Visit    Anxiety    Stable.  On prozac.        Atrial fibrillation (HCC)    No increased heart rate or palpitations.  Continue eliquis.  On diltiazem.  Follow.        BMI 40.0-44.9, adult (HCC)    Diet and exercise.  Follow.        Chronic obstructive pulmonary disease (HCC)    Breathing stable. Continue spiriva. Has rescue inhaler if needed.        Essential hypertension, benign    Continue diltiazem and enalapril.  Follow pressures.  Follow metabolic panel.       Relevant Orders   Basic metabolic panel   Health care maintenance    Physical today 01/22/21.  Mammogram 09/05/20 - birads I.  colonoscopy 2012.  Recommended f/u in 10 years.        Hyperglycemia    Low carb diet and exercise.  Follow met b and a1c.       Relevant Orders   Hemoglobin A1c   PAD (peripheral artery disease) (HCC)    Affecting common iliac arteries mainly.  Continue risk factor modification.  Continue pravastatin.        Pure hypercholesterolemia    On pravastatin.  Did not tolerate additions of zetia.  Had  intolerance to other statin medication.  Discussed repatha.  Follow.       Relevant Orders   Hepatic function panel   Lipid panel   Stress    Overall appears to be handling things well.  Follow.       Tobacco abuse    Discussed the need to quit.  She declines to quit at this time.  Discussed screening CT - lung cancer screening. Declines at this time.  Will notify me if changes her mind about getting the scan.            Einar Pheasant, MD

## 2021-01-22 NOTE — Assessment & Plan Note (Addendum)
Physical today 01/22/21.  Mammogram 09/05/20 - birads I.  colonoscopy 2012.  Recommended f/u in 10 years.

## 2021-01-25 ENCOUNTER — Encounter: Payer: Self-pay | Admitting: Internal Medicine

## 2021-01-25 NOTE — Assessment & Plan Note (Signed)
Stable.  On prozac.

## 2021-01-25 NOTE — Assessment & Plan Note (Signed)
On pravastatin.  Did not tolerate additions of zetia.  Had intolerance to other statin medication.  Discussed repatha.  Follow.

## 2021-01-25 NOTE — Assessment & Plan Note (Signed)
Discussed the need to quit.  She declines to quit at this time.  Discussed screening CT - lung cancer screening. Declines at this time.  Will notify me if changes her mind about getting the scan.

## 2021-01-25 NOTE — Assessment & Plan Note (Signed)
Overall appears to be handling things well.  Follow.  ?

## 2021-01-25 NOTE — Assessment & Plan Note (Signed)
Affecting common iliac arteries mainly.  Continue risk factor modification.  Continue pravastatin.  

## 2021-01-25 NOTE — Assessment & Plan Note (Signed)
Diet and exercise.  Follow.  

## 2021-01-25 NOTE — Assessment & Plan Note (Signed)
Low carb diet and exercise.  Follow met b and a1c.  

## 2021-01-25 NOTE — Assessment & Plan Note (Signed)
No increased heart rate or palpitations.  Continue eliquis.  On diltiazem.  Follow.   

## 2021-01-25 NOTE — Assessment & Plan Note (Signed)
Continue diltiazem and enalapril.  Follow pressures.  Follow metabolic panel.

## 2021-01-25 NOTE — Assessment & Plan Note (Signed)
Breathing stable. Continue spiriva. Has rescue inhaler if needed.

## 2021-02-05 ENCOUNTER — Other Ambulatory Visit: Payer: Self-pay | Admitting: Cardiovascular Disease

## 2021-02-05 NOTE — Progress Notes (Signed)
Cardiology Office Note   Date:  02/06/2021   ID:  Danielle Yates 11/18/1941, MRN [id number]  PCP:  Danielle Pheasant, MD  Cardiologist:   Kathlyn Sacramento, MD   Chief Complaint  Patient presents with  . 12 month follow up     "doing well." Medications reviewed by the patient verbally.       History of Present Illness: Danielle Yates is a 80 y.o. female who presents for a followup visit regarding chronic atrial fibrillation, refractory hypertension and peripheral arterial disease. She did not tolerate metoprolol due to bradycardia and fatigue. She did not tolerate amlodipine and carvedilol due to headache and dizziness.   She underwent renal duplex ultrasound in 2013 which showed no evidence of renal artery stenosis.   She is known to have bilateral common iliac artery disease with peak velocity around 300 with normal ABI bilaterally. She is being treated medically due to lack of significant claudication. She is taking pravastatin for hyperlipidemia. She did not tolerate other statins in the past.  Echocardiogram in September 2018 showed normal LV systolic function, aortic sclerosis without significant stenosis and mildly dilated left atrium.  She was started on Zetia during last visit but did not tolerate the medication due to brain fog.  She has been doing reasonably well with no chest pain or worsening dyspnea.  No leg claudication.  Past Medical History:  Diagnosis Date  . Allergy   . Aortic valve sclerosis    a. TTE 2013: EF 60-65%, no RWMA, Gr1DD, trivial MR, mildly dilated LA 43 mm; b. TTE 08/2017: EF 65-70%, mild LVH, aortic valve sclerosis without stenosis, mildly dilated LA 36 mm, RV size and sys fxn nl  . Arthropathy, unspecified, site unspecified   . Basal cell carcinoma 06/15/2018   upper back right of midline/superficial  . Cataract   . Chronic rhinitis   . Disorder of bone and cartilage, unspecified   . Diverticulosis of colon (without mention of  hemorrhage)   . Essential hypertension    a. refractory HTN; b. renal US 2013 negative for RAS  . External hemorrhoids without mention of complication   . Medication intolerance   . Obesity, unspecified   . Other abnormal glucose   . Panic disorder without agoraphobia   . Peripheral arterial disease ([place])    a.  Known bilateral common iliac disease-> medical therapy-asymptomatic.  [name] Persistent atrial fibrillation ([place])    a. diagnosed 10/19/17; b. CHADS2VASc 5 (HTN, age x 2, vascular disease, female)-->Eliquis  . Pure hypercholesterolemia   . Tobacco use disorder   . Unspecified vitamin D deficiency     Past Surgical History:  Procedure Laterality Date  . APPENDECTOMY  1985  . CHOLECYSTECTOMY  1985   with open wound  . EYE SURGERY  2011   CATARACTS -  BOTH  . Removed spot on face     [name]  . TONSILLECTOMY AND ADENOIDECTOMY  1946  . VAGINAL HYSTERECTOMY  1982   fibroid and endometriosis; one ovary remaining     Current Outpatient Medications  Medication Sig Dispense Refill  . albuterol (VENTOLIN HFA) 108 (90 Base) MCG/ACT inhaler INHALE 2 PUFFS INTO THE LUNGS EVERY 6 HOURS AS NEEDED FOR WHEEZING OR SHORTNESS OF BREATH 8.5 g 2  . diltiazem (TIAZAC) 360 MG 24 hr capsule TAKE 1 CAPSULE(360 MG) BY MOUTH DAILY 90 capsule 0  . ELIQUIS 5 MG TABS tablet TAKE 1 TABLET(5 MG) BY MOUTH TWICE DAILY 180 tablet 1  .  enalapril (VASOTEC) 20 MG tablet TAKE 1 TABLET BY MOUTH EVERY MORNING AND 2 TABLETS BY MOUTH EVERY AFTERNOON 270 tablet 3  . FLUoxetine (PROZAC) 20 MG capsule TAKE 1 CAPSULE(20 MG) BY MOUTH DAILY 90 capsule 1  . fluticasone (FLONASE) 50 MCG/ACT nasal spray SHAKE LIQUID AND USE 2 SPRAYS IN EACH NOSTRIL DAILY 48 g 2  . Multiple Vitamins-Minerals (PRESERVISION AREDS) CAPS     . pravastatin (PRAVACHOL) 10 MG tablet TAKE 1 TABLET(10 MG) BY MOUTH DAILY 90 tablet 3  . Probiotic Product (PROBIOTIC DAILY PO) Take 1 tablet by mouth.    . Tiotropium Bromide Monohydrate (SPIRIVA  RESPIMAT) 1.25 MCG/ACT AERS Inhale 2 puffs into the lungs daily. 4 g 6  . triamterene-hydrochlorothiazide (MAXZIDE-25) 37.5-25 MG tablet Take 1 tablet by mouth daily. 90 tablet 1  . VITAMIN D, CHOLECALCIFEROL, PO Take by mouth daily.     No current facility-administered medications for this visit.    Allergies:   Amlodipine, Codeine, Coreg [carvedilol], Crestor [rosuvastatin], Metoprolol, Statins, and Nickel    Social History:  The patient  reports that she has been smoking cigarettes. She has a 25.00 pack-year smoking history. She has never used smokeless tobacco. She reports that she does not drink alcohol and does not use drugs.   Family History:  The patient's family history includes Aneurysm in her mother; Cancer in her brother; Heart disease in her brother and father; Hyperlipidemia in her brother; Hypertension in her brother and sister; Rheumatic fever in her mother.    ROS:  Please see the history of present illness.   Otherwise, review of systems are positive for none.   All other systems are reviewed and negative.    PHYSICAL EXAM: VS:  BP 130/80 (BP Location: Left Wrist, Patient Position: Sitting, Cuff Size: Normal)   Pulse (!) 108   Ht 5\' 4"  (1.626 m)   Wt 238 lb 6 oz (108.1 kg)   SpO2 98%   BMI 40.92 kg/m  , BMI Body mass index is 40.92 kg/m. GEN: Well nourished, well developed, in no acute distress  HEENT: normal  Neck: no JVD, carotid bruits, or masses Cardiac: Irregularly irregular; 2/6 systolic ejection murmur in the aortic area. No rubs, gallops or edema  Respiratory:  clear to auscultation bilaterally, normal work of breathing GI: soft, nontender, nondistended, + BS MS: no deformity or atrophy  Skin: warm and dry, no rash Neuro:  Strength and sensation are intact Psych: euthymic mood, full affect Dorsalis pedis is normal bilaterally.  EKG:  EKG is ordered today. The ekg ordered today demonstrates atrial fibrillation with a ventricular rate of 108 bpm.  Low  voltage and nonspecific T wave changes.   Recent Labs: 01/20/2021: ALT 11; BUN 14; Creatinine, Ser 0.93; Hemoglobin 14.7; Platelets 248; Potassium 3.9; Sodium 139; TSH 2.560    Lipid Panel    Component Value Date/Time   CHOL 163 01/20/2021 0836   TRIG 84 01/20/2021 0836   HDL 62 01/20/2021 0836   CHOLHDL 2.6 01/20/2021 0836   LDLCALC 85 01/20/2021 0836      Wt Readings from Last 3 Encounters:  02/06/21 238 lb 6 oz (108.1 kg)  01/22/21 238 lb (108 kg)  09/18/20 238 lb 9.6 oz (108.2 kg)       No flowsheet data found.    ASSESSMENT AND PLAN:  1.  Permanent atrial fibrillation: Ventricular rate is usually well controlled on diltiazem.  She is mildly tachycardic but she walked from the medical mall entrance.  She is  on long-term anticoagulation with Eliquis.  I reviewed recent labs which were unremarkable.    2. Peripheral arterial disease: Mainly with disease affecting the common iliac arteries bilaterally. The patient has palpable distal pulses and has no significant claudication at the present time. Recommend continuing medical therapy.  3.  Aortic sclerosis: No significant change in her cardiac murmur  4. Essential hypertension: Blood pressure is reasonably controlled  5.  Hyperlipidemia: Currently on low-dose pravastatin due to intolerance to other statins.  She did not tolerate Zetia.  Most recent LDL was 85.  6. Tobacco use: She has not been able to quit smoking.    Disposition:   FU with me in 12 months.  Signed,  Kathlyn Sacramento, MD  02/06/2021 8:18 AM    [place]

## 2021-02-06 ENCOUNTER — Ambulatory Visit: Payer: PPO | Admitting: Cardiovascular Disease

## 2021-02-06 ENCOUNTER — Encounter: Payer: Self-pay | Admitting: Cardiovascular Disease

## 2021-02-06 ENCOUNTER — Other Ambulatory Visit: Payer: Self-pay

## 2021-02-06 VITALS — BP 130/80 | HR 108 | Ht 64.0 in | Wt 238.4 lb

## 2021-02-06 DIAGNOSIS — I4821 Permanent atrial fibrillation: Secondary | ICD-10-CM | POA: Diagnosis not present

## 2021-02-06 DIAGNOSIS — E785 Hyperlipidemia, unspecified: Secondary | ICD-10-CM | POA: Diagnosis not present

## 2021-02-06 DIAGNOSIS — I358 Other nonrheumatic aortic valve disorders: Secondary | ICD-10-CM | POA: Diagnosis not present

## 2021-02-06 DIAGNOSIS — Z720 Tobacco use: Secondary | ICD-10-CM

## 2021-02-06 DIAGNOSIS — I739 Peripheral vascular disease, unspecified: Secondary | ICD-10-CM | POA: Diagnosis not present

## 2021-02-06 DIAGNOSIS — I10 Essential (primary) hypertension: Secondary | ICD-10-CM | POA: Diagnosis not present

## 2021-02-06 MED ORDER — DILTIAZEM HCL ER BEADS 360 MG PO CP24: 360.0000 mg | ORAL_CAPSULE | Freq: Every day | ORAL | 1 refills | Status: DC

## 2021-02-06 NOTE — Patient Instructions (Signed)

## 2021-02-14 ENCOUNTER — Other Ambulatory Visit: Payer: Self-pay | Admitting: Internal Medicine

## 2021-03-10 ENCOUNTER — Telehealth: Payer: Self-pay | Admitting: Internal Medicine

## 2021-03-10 NOTE — Telephone Encounter (Signed)
Tried calling patient to [employer] Visit (AWV) either virtually or in office.  No answer   Last AWV ;02/02/16  please schedule at anytime

## 2021-03-16 DIAGNOSIS — H353211 Exudative age-related macular degeneration, right eye, with active choroidal neovascularization: Secondary | ICD-10-CM | POA: Diagnosis not present

## 2021-04-20 ENCOUNTER — Telehealth (INDEPENDENT_AMBULATORY_CARE_PROVIDER_SITE_OTHER): Payer: PPO | Admitting: Internal Medicine

## 2021-04-20 ENCOUNTER — Other Ambulatory Visit: Payer: Self-pay

## 2021-04-20 ENCOUNTER — Telehealth: Payer: Self-pay | Admitting: Internal Medicine

## 2021-04-20 DIAGNOSIS — I10 Essential (primary) hypertension: Secondary | ICD-10-CM | POA: Diagnosis not present

## 2021-04-20 DIAGNOSIS — U071 COVID-19: Secondary | ICD-10-CM | POA: Diagnosis not present

## 2021-04-20 NOTE — Telephone Encounter (Signed)
Patient was exposed to [place] 04/11/21. Symptoms started on 04/17/21. Lymphnode in neck swollen, sinus symptoms, diarrhea, fatigue, slight intermittent headache. Tested negative for COVID yesterday but positive this morning. She is wondering if she shoud take the antiviral treatment. Confirmed no SOB, chest tightness, etc. Do you want to do a virtual with her to discuss and if so do you want me to add her on todays schedule or tomorrow?

## 2021-04-20 NOTE — Telephone Encounter (Signed)
Ok to work in today

## 2021-04-20 NOTE — Telephone Encounter (Signed)
Pt scheduled for today.  

## 2021-04-20 NOTE — Progress Notes (Signed)
Patient ID: Danielle Yates, female   DOB: 1941-07-26, 80 y.o.   MRN: [id number]   Virtual Visit via video Note  This visit type was conducted due to national recommendations for restrictions regarding the COVID-19 pandemic (e.g. social distancing).  This format is felt to be most appropriate for this patient at this time.  All issues noted in this document were discussed and addressed.  No physical exam was performed (except for noted visual exam findings with Video Visits).   I connected with Danielle Yates  by a video enabled telemedicine application and verified that I am speaking with the correct person using two identifiers. Location patient: home Location provider: work  Persons participating in the virtual visit: patient, provider  The limitations, risks, security and privacy concerns of performing an evaluation and management service by video and the availability of in person appointments have been discussed.  It has also been discussed with the patient that there may be a patient responsible charge related to this service. The patient expressed understanding and agreed to proceed.   Reason for visit: work in appt  HPI: Work in - covid positive.  Exposed - friend sic - friend covid positive.   She developed symptoms (slight headache) around 04/12/21.  Some runny nose with nasal stuffiness.  Two days ago and yesterday - diarrhea.  None today.  Some cough.  No sore throat. Using flonase.  Did notice left lymph node swelling (intermittent).  Present for 4-5 days.  She is eating.  No nausea or vomiting.  Good appetite.  Took mucinex yesterday.  Using spiriva daily.  No sob, chest pain or chest tightness.  covid test positive this am.     ROS: See pertinent positives and negatives per HPI.  Past Medical History:  Diagnosis Date  . Allergy   . Aortic valve sclerosis    a. TTE 2013: EF 60-65%, no RWMA, Gr1DD, trivial MR, mildly dilated LA 43 mm; b. TTE 08/2017: EF 65-70%, mild LVH, aortic  valve sclerosis without stenosis, mildly dilated LA 36 mm, RV size and sys fxn nl  . Arthropathy, unspecified, site unspecified   . Basal cell carcinoma 06/15/2018   upper back right of midline/superficial  . Cataract   . Chronic rhinitis   . Disorder of bone and cartilage, unspecified   . Diverticulosis of colon (without mention of hemorrhage)   . Essential hypertension    a. refractory HTN; b. renal US 2013 negative for RAS  . External hemorrhoids without mention of complication   . Medication intolerance   . Obesity, unspecified   . Other abnormal glucose   . Panic disorder without agoraphobia   . Peripheral arterial disease ([place])    a.  Known bilateral common iliac disease-> medical therapy-asymptomatic.  [name] Persistent atrial fibrillation ([place])    a. diagnosed 10/19/17; b. CHADS2VASc 5 (HTN, age x 2, vascular disease, female)-->Eliquis  . Pure hypercholesterolemia   . Tobacco use disorder   . Unspecified vitamin D deficiency     Past Surgical History:  Procedure Laterality Date  . APPENDECTOMY  1985  . CHOLECYSTECTOMY  1985   with open wound  . EYE SURGERY  2011   CATARACTS -  BOTH  . Removed spot on face     [name]  . TONSILLECTOMY AND ADENOIDECTOMY  1946  . VAGINAL HYSTERECTOMY  1982   fibroid and endometriosis; one ovary remaining    Family History  Problem Relation Age of Onset  . Hypertension Sister   .  Heart disease Brother   . Hypertension Brother   . Hyperlipidemia Brother   . Cancer Brother   . Aneurysm Mother   . Rheumatic fever Mother        as an adult  . Heart disease Father        MI x 2    SOCIAL HX: reviewed.    Current Outpatient Medications:  .  albuterol (VENTOLIN HFA) 108 (90 Base) MCG/ACT inhaler, INHALE 2 PUFFS INTO THE LUNGS EVERY 6 HOURS AS NEEDED FOR WHEEZING OR SHORTNESS OF BREATH, Disp: 8.5 g, Rfl: 2 .  diltiazem (TIAZAC) 360 MG 24 hr capsule, Take 1 capsule (360 mg total) by mouth daily., Disp: 90 capsule, Rfl: 1 .  ELIQUIS 5 MG  TABS tablet, TAKE 1 TABLET(5 MG) BY MOUTH TWICE DAILY, Disp: 180 tablet, Rfl: 1 .  enalapril (VASOTEC) 20 MG tablet, TAKE 1 TABLET BY MOUTH EVERY MORNING AND 2 TABLETS BY MOUTH EVERY AFTERNOON, Disp: 270 tablet, Rfl: 3 .  FLUoxetine (PROZAC) 20 MG capsule, TAKE 1 CAPSULE(20 MG) BY MOUTH DAILY, Disp: 90 capsule, Rfl: 1 .  fluticasone (FLONASE) 50 MCG/ACT nasal spray, SHAKE LIQUID AND USE 2 SPRAYS IN EACH NOSTRIL DAILY, Disp: 48 g, Rfl: 2 .  Multiple Vitamins-Minerals (PRESERVISION AREDS) CAPS, , Disp: , Rfl:  .  pravastatin (PRAVACHOL) 10 MG tablet, TAKE 1 TABLET(10 MG) BY MOUTH DAILY, Disp: 90 tablet, Rfl: 3 .  Probiotic Product (PROBIOTIC DAILY PO), Take 1 tablet by mouth., Disp: , Rfl:  .  Tiotropium Bromide Monohydrate (SPIRIVA RESPIMAT) 1.25 MCG/ACT AERS, Inhale 2 puffs into the lungs daily., Disp: 4 g, Rfl: 6 .  triamterene-hydrochlorothiazide (MAXZIDE-25) 37.5-25 MG tablet, Take 1 tablet by mouth daily., Disp: 90 tablet, Rfl: 1 .  VITAMIN D, CHOLECALCIFEROL, PO, Take by mouth daily., Disp: , Rfl:   EXAM:  GENERAL: alert, oriented, appears well and in no acute distress  HEENT: atraumatic, conjunttiva clear, no obvious abnormalities on inspection of external nose and ears  NECK: normal movements of the head and neck  LUNGS: on inspection no signs of respiratory distress, breathing rate appears normal, no obvious gross SOB, gasping or wheezing  CV: no obvious cyanosis  PSYCH/NEURO: pleasant and cooperative, no obvious depression or anxiety, speech and thought processing grossly intact  ASSESSMENT AND PLAN:  Discussed the following assessment and plan:  Problem List Items Addressed This Visit    COVID-19 virus infection    Tested positive for covid this am.  Symptoms began 04/12/21 with minimal headache.  Progressed over the last two days.  No diarrhea today.  No chest pain or sob.  Continue spiriva.  Continue flonase and saline nasal spray as directed.  Mucinex/robitussin if needed.   Rest.  Fluids.  Follow closely. Discussed quarantine guidelines.  Hold on oral antiviral medications given > 5 days since symptoms started.  Wants to hold on MAB infusion.  Monitor symptoms. Call with update.  Positive lymph node.  Feel related to covid.  Follow.  See if resolves as infection clears.  Call with update.  Further w/up if persistent.       Essential hypertension, benign    Continue diltiazem and enalapril.  Follow pressures.          Return if symptoms worsen or fail to improve.   I discussed the assessment and treatment plan with the patient. The patient was provided an opportunity to ask questions and all were answered. The patient agreed with the plan and demonstrated an understanding of the  instructions.   The patient was advised to call back or seek an in-person evaluation if the symptoms worsen or if the condition fails to improve as anticipated.   Einar Pheasant, MD

## 2021-04-20 NOTE — Telephone Encounter (Signed)
PT called in stating she was watching someone over a week ago that had symptoms and now she is exhibiting symptoms and would like to know what to do or take to feel better.

## 2021-04-21 ENCOUNTER — Telehealth: Payer: Self-pay | Admitting: Internal Medicine

## 2021-04-21 NOTE — Telephone Encounter (Signed)
My chart message sent to pt for update.   

## 2021-04-23 ENCOUNTER — Encounter: Payer: Self-pay | Admitting: Internal Medicine

## 2021-04-26 ENCOUNTER — Encounter: Payer: Self-pay | Admitting: Internal Medicine

## 2021-04-26 DIAGNOSIS — U071 COVID-19: Secondary | ICD-10-CM | POA: Insufficient documentation

## 2021-04-26 NOTE — Assessment & Plan Note (Signed)
Continue diltiazem and enalapril.  Follow pressures.  

## 2021-04-26 NOTE — Assessment & Plan Note (Addendum)
Tested positive for covid this am.  Symptoms began 04/12/21 with minimal headache.  Progressed over the last two days.  No diarrhea today.  No chest pain or sob.  Continue spiriva.  Continue flonase and saline nasal spray as directed.  Mucinex/robitussin if needed.  Rest.  Fluids.  Follow closely. Discussed quarantine guidelines.  Hold on oral antiviral medications given > 5 days since symptoms started.  Wants to hold on MAB infusion.  Monitor symptoms. Call with update.  Positive lymph node.  Feel related to covid.  Follow.  See if resolves as infection clears.  Call with update.  Further w/up if persistent.

## 2021-05-10 ENCOUNTER — Other Ambulatory Visit: Payer: Self-pay | Admitting: Cardiovascular Disease

## 2021-05-11 ENCOUNTER — Other Ambulatory Visit: Payer: Self-pay | Admitting: Cardiovascular Disease

## 2021-05-11 NOTE — Telephone Encounter (Signed)
53f, 108.1kg, scr 0.93 01/20/21, lovw/arida 02/06/21

## 2021-05-11 NOTE — Telephone Encounter (Signed)
Please review for refill, thanks ! 

## 2021-05-15 ENCOUNTER — Other Ambulatory Visit: Payer: Self-pay | Admitting: Internal Medicine

## 2021-05-20 ENCOUNTER — Telehealth: Payer: Self-pay | Admitting: Internal Medicine

## 2021-05-20 DIAGNOSIS — I10 Essential (primary) hypertension: Secondary | ICD-10-CM

## 2021-05-20 NOTE — Addendum Note (Signed)
Addended by: Lars Masson on: 05/20/2021 09:13 AM   Modules accepted: Orders

## 2021-05-20 NOTE — Telephone Encounter (Signed)
Added CBC per patients request

## 2021-05-20 NOTE — Telephone Encounter (Signed)
Called to screen pt for next weeks appt. She needs her lab order to go to [facility]

## 2021-05-20 NOTE — Telephone Encounter (Signed)
Labs changed to [facility]. Patient is aware.

## 2021-05-20 NOTE — Addendum Note (Signed)
Addended by: Lars Masson on: 05/20/2021 09:11 AM   Modules accepted: Orders

## 2021-05-25 DIAGNOSIS — R739 Hyperglycemia, unspecified: Secondary | ICD-10-CM | POA: Diagnosis not present

## 2021-05-25 DIAGNOSIS — I10 Essential (primary) hypertension: Secondary | ICD-10-CM | POA: Diagnosis not present

## 2021-05-25 DIAGNOSIS — E7800 Pure hypercholesterolemia, unspecified: Secondary | ICD-10-CM | POA: Diagnosis not present

## 2021-05-26 LAB — LIPID PANEL
Chol/HDL Ratio: 2.5 ratio (ref 0.0–4.4)
Cholesterol, Total: 152 mg/dL (ref 100–199)
HDL: 60 mg/dL (ref >39)
LDL Chol Calc (NIH): 78 mg/dL (ref 0–99)
Triglycerides: 74 mg/dL (ref 0–149)
VLDL Cholesterol Cal: 14 mg/dL (ref 5–40)

## 2021-05-26 LAB — BASIC METABOLIC PANEL
BUN/Creatinine Ratio: 14 (ref 12–28)
BUN: 13 mg/dL (ref 8–27)
CO2: 26 mmol/L (ref 20–29)
Calcium: 9.3 mg/dL (ref 8.7–10.3)
Chloride: 97 mmol/L (ref 96–106)
Creatinine, Ser: 0.9 mg/dL (ref 0.57–1.00)
Glucose: 98 mg/dL (ref 65–99)
Potassium: 3.4 mmol/L — ABNORMAL LOW (ref 3.5–5.2)
Sodium: 136 mmol/L (ref 134–144)
eGFR: 65 mL/min/{1.73_m2} (ref >59)

## 2021-05-26 LAB — CBC WITH DIFFERENTIAL/PLATELET
Basophils Absolute: 0 10*3/uL (ref 0.0–0.2)
Basos: 0 %
EOS (ABSOLUTE): 0.1 10*3/uL (ref 0.0–0.4)
Eos: 1 %
Hematocrit: 41.9 % (ref 34.0–46.6)
Hemoglobin: 14.2 g/dL (ref 11.1–15.9)
Immature Grans (Abs): 0 10*3/uL (ref 0.0–0.1)
Immature Granulocytes: 0 %
Lymphocytes Absolute: 1.6 10*3/uL (ref 0.7–3.1)
Lymphs: 16 %
MCH: 29.8 pg (ref 26.6–33.0)
MCHC: 33.9 g/dL (ref 31.5–35.7)
MCV: 88 fL (ref 79–97)
Monocytes Absolute: 0.7 10*3/uL (ref 0.1–0.9)
Monocytes: 8 %
Neutrophils Absolute: 7.2 10*3/uL — ABNORMAL HIGH (ref 1.4–7.0)
Neutrophils: 75 %
Platelets: 239 10*3/uL (ref 150–450)
RBC: 4.77 x10E6/uL (ref 3.77–5.28)
RDW: 11.9 % (ref 11.7–15.4)
WBC: 9.6 10*3/uL (ref 3.4–10.8)

## 2021-05-26 LAB — HEPATIC FUNCTION PANEL
ALT: 10 IU/L (ref 0–32)
AST: 15 IU/L (ref 0–40)
Albumin: 4.2 g/dL (ref 3.7–4.7)
Alkaline Phosphatase: 65 IU/L (ref 44–121)
Bilirubin Total: 1.2 mg/dL (ref 0.0–1.2)
Bilirubin, Direct: 0.32 mg/dL (ref 0.00–0.40)
Total Protein: 6.7 g/dL (ref 6.0–8.5)

## 2021-05-26 LAB — HEMOGLOBIN A1C
Est. average glucose Bld gHb Est-mCnc: 111 mg/dL
Hgb A1c MFr Bld: 5.5 % (ref 4.8–5.6)

## 2021-05-27 ENCOUNTER — Other Ambulatory Visit: Payer: Self-pay

## 2021-05-27 ENCOUNTER — Ambulatory Visit (INDEPENDENT_AMBULATORY_CARE_PROVIDER_SITE_OTHER): Payer: PPO | Admitting: Internal Medicine

## 2021-05-27 VITALS — BP 126/80 | HR 90 | Temp 98.0°F | Resp 18 | Ht 64.0 in | Wt 231.0 lb

## 2021-05-27 DIAGNOSIS — R739 Hyperglycemia, unspecified: Secondary | ICD-10-CM | POA: Diagnosis not present

## 2021-05-27 DIAGNOSIS — E7800 Pure hypercholesterolemia, unspecified: Secondary | ICD-10-CM | POA: Diagnosis not present

## 2021-05-27 DIAGNOSIS — I10 Essential (primary) hypertension: Secondary | ICD-10-CM

## 2021-05-27 DIAGNOSIS — F439 Reaction to severe stress, unspecified: Secondary | ICD-10-CM | POA: Diagnosis not present

## 2021-05-27 DIAGNOSIS — J449 Chronic obstructive pulmonary disease, unspecified: Secondary | ICD-10-CM | POA: Diagnosis not present

## 2021-05-27 DIAGNOSIS — E876 Hypokalemia: Secondary | ICD-10-CM | POA: Insufficient documentation

## 2021-05-27 DIAGNOSIS — J029 Acute pharyngitis, unspecified: Secondary | ICD-10-CM

## 2021-05-27 DIAGNOSIS — I739 Peripheral vascular disease, unspecified: Secondary | ICD-10-CM | POA: Diagnosis not present

## 2021-05-27 DIAGNOSIS — I4821 Permanent atrial fibrillation: Secondary | ICD-10-CM | POA: Diagnosis not present

## 2021-05-27 DIAGNOSIS — R059 Cough, unspecified: Secondary | ICD-10-CM | POA: Diagnosis not present

## 2021-05-27 NOTE — Progress Notes (Signed)
Patient ID: Danielle Yates, female   DOB: 23-Mar-1941, 80 y.o.   MRN: [id number]   Virtual Visit via video Note  This visit type was conducted due to national recommendations for restrictions regarding the COVID-19 pandemic (e.g. social distancing).  This format is felt to be most appropriate for this patient at this time.  All issues noted in this document were discussed and addressed.  No physical exam was performed (except for noted visual exam findings with Video Visits).   I connected with Erasmo Downer by a video enabled telemedicine application and verified that I am speaking with the correct person using two identifiers. Location patient: home Location provider: work  Persons participating in the virtual visit: patient, provider  The limitations, risks, security and privacy concerns of performing an evaluation and management service by video and the availability of in person appointments have been discussed.  It has also been discussed with the patient that there may be a patient responsible charge related to this service. The patient expressed understanding and agreed to proceed.   Reason for visit:  follow up appt  HPI: Here to follow up regarding her blood pressure, cholesterol and blood sugar.  Recently had covid.  Had persistent gland enlargement and tenderness.  She had been feeling better.  Gland - almost resolved.  Developed symptoms started a few days ago.  Sore throat - scratchy throat.  Ears hurting.  No sinus congestion/pressure.  No fever.  No chest congestion, chest pain or chest tightness.  No sob.  No nausea or vomiting.  Eating.  No diarrhea.  AM cough.  Started mucinex.  Using flonase.  Using spiriva.     ROS: See pertinent positives and negatives per HPI.  Past Medical History:  Diagnosis Date   Allergy    Aortic valve sclerosis    a. TTE 2013: EF 60-65%, no RWMA, Gr1DD, trivial MR, mildly dilated LA 43 mm; b. TTE 08/2017: EF 65-70%, mild LVH, aortic valve  sclerosis without stenosis, mildly dilated LA 36 mm, RV size and sys fxn nl   Arthropathy, unspecified, site unspecified    Basal cell carcinoma 06/15/2018   upper back right of midline/superficial   Cataract    Chronic rhinitis    Disorder of bone and cartilage, unspecified    Diverticulosis of colon (without mention of hemorrhage)    Essential hypertension    a. refractory HTN; b. renal US 2013 negative for RAS   External hemorrhoids without mention of complication    Medication intolerance    Obesity, unspecified    Other abnormal glucose    Panic disorder without agoraphobia    Peripheral arterial disease ([place])    a.  Known bilateral common iliac disease-> medical therapy-asymptomatic.   Persistent atrial fibrillation ([place])    a. diagnosed 10/19/17; b. CHADS2VASc 5 (HTN, age x 2, vascular disease, female)-->Eliquis   Pure hypercholesterolemia    Tobacco use disorder    Unspecified vitamin D deficiency     Past Surgical History:  Procedure Laterality Date   [place]   with open wound   EYE SURGERY  2011   CATARACTS -  BOTH   Removed spot on face     [place]   fibroid and endometriosis; one ovary remaining    Family History  Problem Relation Age of Onset   Hypertension Sister    Heart disease Brother  Hypertension Brother    Hyperlipidemia Brother    Cancer Brother    Aneurysm Mother    Rheumatic fever Mother        as an adult   Heart disease Father        MI x 2    SOCIAL HX: reviewed.    Current Outpatient Medications:    ELIQUIS 5 MG TABS tablet, TAKE 1 TABLET(5 MG) BY MOUTH TWICE DAILY, Disp: 180 tablet, Rfl: 1   albuterol (VENTOLIN HFA) 108 (90 Base) MCG/ACT inhaler, INHALE 2 PUFFS INTO THE LUNGS EVERY 6 HOURS AS NEEDED FOR WHEEZING OR SHORTNESS OF BREATH, Disp: 8.5 g, Rfl: 2   amoxicillin (AMOXIL) 875 MG tablet, Take 1 tablet (875 mg total) by  mouth 2 (two) times daily for 10 days., Disp: 20 tablet, Rfl: 0   diltiazem (TIAZAC) 360 MG 24 hr capsule, TAKE 1 CAPSULE(360 MG) BY MOUTH DAILY, Disp: 90 capsule, Rfl: 1   enalapril (VASOTEC) 20 MG tablet, TAKE 1 TABLET BY MOUTH EVERY MORNING AND 2 TABLETS BY MOUTH EVERY AFTERNOON, Disp: 270 tablet, Rfl: 3   FLUoxetine (PROZAC) 20 MG capsule, TAKE 1 CAPSULE(20 MG) BY MOUTH DAILY, Disp: 90 capsule, Rfl: 1   fluticasone (FLONASE) 50 MCG/ACT nasal spray, SHAKE LIQUID AND USE 2 SPRAYS IN EACH NOSTRIL DAILY, Disp: 48 g, Rfl: 2   Multiple Vitamins-Minerals (PRESERVISION AREDS) CAPS, , Disp: , Rfl:    pravastatin (PRAVACHOL) 10 MG tablet, TAKE 1 TABLET(10 MG) BY MOUTH DAILY, Disp: 90 tablet, Rfl: 3   Probiotic Product (PROBIOTIC DAILY PO), Take 1 tablet by mouth., Disp: , Rfl:    SPIRIVA RESPIMAT 1.25 MCG/ACT AERS, INHALE 2 PUFFS INTO THE LUNGS DAILY, Disp: 4 g, Rfl: 6   triamterene-hydrochlorothiazide (MAXZIDE-25) 37.5-25 MG tablet, Take 1 tablet by mouth daily., Disp: 90 tablet, Rfl: 1   VITAMIN D, CHOLECALCIFEROL, PO, Take by mouth daily., Disp: , Rfl:   EXAM:  GENERAL: alert, oriented, appears well and in no acute distress  HEENT: atraumatic, conjunttiva clear, no obvious abnormalities on inspection of external nose and ears  NECK: normal movements of the head and neck  LUNGS: on inspection no signs of respiratory distress, breathing rate appears normal, no obvious gross SOB, gasping or wheezing  CV: no obvious cyanosis  PSYCH/NEURO: pleasant and cooperative, no obvious depression or anxiety, speech and thought processing grossly intact  ASSESSMENT AND PLAN:  Discussed the following assessment and plan:  Problem List Items Addressed This Visit     Atrial fibrillation (HCC)    No increased heart rate or palpitations.  Continue eliquis.  On diltiazem.  Follow.         Chronic obstructive pulmonary disease (HCC)    Has been followed by pulmonary.  Breathing stable.        Cough     Cough/congestion as outlined.  Continue mucinex.  Saline nasal spray and flonase as directed.  Continue spiriva.  Amoxicillin as directed.  Follow.  Call with update.  Discussed covid.  Agreeable for covid testing.  Discussed quarantine guidelines.         Essential hypertension, benign    Continue diltiazem and enalapril.  Follow pressures.        Hyperglycemia    Low carb diet and exercise.  Follow met b and a1c.        Hypokalemia   Relevant Orders   Potassium   PAD (peripheral artery disease) ([place])    Affecting common iliac arteries mainly.  Continue risk factor  modification.  Continue pravastatin.        Pure hypercholesterolemia    On pravastatin.  Did not tolerate additions of zetia.  Had intolerance to other statin medication.  Follow lipid panel and liver function tests.         Stress    Increased stress.  Discussed.  Does not feel needs any further intervention.  Follow.        Other Visit Diagnoses     Sore throat    -  Primary   Relevant Orders   Novel Coronavirus, NAA ([facility]) (Completed)       Return in about 2 months (around 07/27/2021) for follow up appt (49mn).   I discussed the assessment and treatment plan with the patient. The patient was provided an opportunity to ask questions and all were answered. The patient agreed with the plan and demonstrated an understanding of the instructions.   The patient was advised to call back or seek an in-person evaluation if the symptoms worsen or if the condition fails to improve as anticipated.   CEinar Pheasant MD

## 2021-05-28 LAB — NOVEL CORONAVIRUS, NAA: SARS-CoV-2, NAA: NOT DETECTED

## 2021-05-28 LAB — SARS-COV-2, NAA 2 DAY TAT

## 2021-05-29 ENCOUNTER — Encounter: Payer: Self-pay | Admitting: Internal Medicine

## 2021-05-29 ENCOUNTER — Other Ambulatory Visit: Payer: Self-pay | Admitting: Internal Medicine

## 2021-05-29 ENCOUNTER — Telehealth: Payer: Self-pay

## 2021-05-29 MED ORDER — AMOXICILLIN 875 MG PO TABS: 875.0000 mg | ORAL_TABLET | Freq: Two times a day (BID) | ORAL | 0 refills | Status: AC

## 2021-05-29 NOTE — Telephone Encounter (Signed)
LM for patient to call back to let her know that amoxicillin had been sent in for patient & advise probiotic.

## 2021-05-29 NOTE — Progress Notes (Signed)
Rx sent in for amoxicillin

## 2021-06-07 ENCOUNTER — Encounter: Payer: Self-pay | Admitting: Internal Medicine

## 2021-06-07 DIAGNOSIS — R059 Cough, unspecified: Secondary | ICD-10-CM | POA: Insufficient documentation

## 2021-06-07 NOTE — Assessment & Plan Note (Signed)
Affecting common iliac arteries mainly.  Continue risk factor modification.  Continue pravastatin.  

## 2021-06-07 NOTE — Assessment & Plan Note (Signed)
Cough/congestion as outlined.  Continue mucinex.  Saline nasal spray and flonase as directed.  Continue spiriva.  Amoxicillin as directed.  Follow.  Call with update.  Discussed covid.  Agreeable for covid testing.  Discussed quarantine guidelines.

## 2021-06-07 NOTE — Assessment & Plan Note (Signed)
Continue diltiazem and enalapril.  Follow pressures.  

## 2021-06-07 NOTE — Assessment & Plan Note (Signed)
On pravastatin.  Did not tolerate additions of zetia.  Had intolerance to other statin medication.  Follow lipid panel and liver function tests.

## 2021-06-07 NOTE — Assessment & Plan Note (Signed)
Low carb diet and exercise.  Follow met b and a1c.  

## 2021-06-07 NOTE — Assessment & Plan Note (Signed)
Has been followed by pulmonary.  Breathing stable.  

## 2021-06-07 NOTE — Assessment & Plan Note (Signed)
Increased stress.  Discussed.  Does not feel needs any further intervention.  Follow.   

## 2021-06-07 NOTE — Assessment & Plan Note (Signed)
No increased heart rate or palpitations.  Continue eliquis.  On diltiazem.  Follow.   

## 2021-06-09 ENCOUNTER — Other Ambulatory Visit: Payer: Self-pay

## 2021-06-09 ENCOUNTER — Ambulatory Visit: Payer: PPO | Admitting: Podiatry

## 2021-06-09 ENCOUNTER — Encounter: Payer: Self-pay | Admitting: Podiatry

## 2021-06-09 DIAGNOSIS — L600 Ingrowing nail: Secondary | ICD-10-CM | POA: Diagnosis not present

## 2021-06-09 MED ORDER — GENTAMICIN SULFATE 0.1 % EX CREA: 1.0000 "application " | TOPICAL_CREAM | Freq: Two times a day (BID) | CUTANEOUS | 1 refills | Status: DC

## 2021-06-09 NOTE — Progress Notes (Signed)
   Subjective: Patient presents today for evaluation of pain to the lateral border left great toe. Patient is concerned for possible ingrown nail.  It is very sensitive to touch.  Patient presents today for further treatment and evaluation.  Past Medical History:  Diagnosis Date   Allergy    Aortic valve sclerosis    a. TTE 2013: EF 60-65%, no RWMA, Gr1DD, trivial MR, mildly dilated LA 43 mm; b. TTE 08/2017: EF 65-70%, mild LVH, aortic valve sclerosis without stenosis, mildly dilated LA 36 mm, RV size and sys fxn nl   Arthropathy, unspecified, site unspecified    Basal cell carcinoma 06/15/2018   upper back right of midline/superficial   Cataract    Chronic rhinitis    Disorder of bone and cartilage, unspecified    Diverticulosis of colon (without mention of hemorrhage)    Essential hypertension    a. refractory HTN; b. renal US 2013 negative for RAS   External hemorrhoids without mention of complication    Medication intolerance    Obesity, unspecified    Other abnormal glucose    Panic disorder without agoraphobia    Peripheral arterial disease ([place])    a.  Known bilateral common iliac disease-> medical therapy-asymptomatic.   Persistent atrial fibrillation ([place])    a. diagnosed 10/19/17; b. CHADS2VASc 5 (HTN, age x 2, vascular disease, female)-->Eliquis   Pure hypercholesterolemia    Tobacco use disorder    Unspecified vitamin D deficiency     Objective:  General: Well developed, nourished, in no acute distress, alert and oriented x3   Dermatology: Skin is warm, dry and supple bilateral.  Lateral border left great toe appears to be erythematous with evidence of an ingrowing nail. Pain on palpation noted to the border of the nail fold. The remaining nails appear unremarkable at this time. There are no open sores, lesions.  Vascular: Dorsalis Pedis artery and Posterior Tibial artery pedal pulses palpable. No lower extremity edema noted.   Neruologic: Grossly intact via light  touch bilateral.  Musculoskeletal: Muscular strength within normal limits in all groups bilateral. Normal range of motion noted to all pedal and ankle joints.   Assesement: #1 Paronychia with ingrowing nail lateral border left great toe #2 Pain in toe  Plan of Care:  1. Patient evaluated.  2. Discussed treatment alternatives and plan of care. Explained nail avulsion procedure and post procedure course to patient. 3. Patient opted for permanent partial nail avulsion of the lateral border left great toe.  4. Prior to procedure, local anesthesia infiltration utilized using 3 ml of a 50:50 mixture of 2% plain lidocaine and 0.5% plain marcaine in a normal hallux block fashion and a betadine prep performed.  5. Partial permanent nail avulsion with chemical matrixectomy performed using [id number] applications of phenol followed by alcohol flush.  6. Light dressing applied.  Post care instructions provided 7.  Prescription for gentamicin 2% cream  8.  Return to clinic 2 weeks.  Edrick Kins, DPM [facility]  Dr. Edrick Kins, DPM    [address], [place] [address]                Office [phone]  Fax [phone]

## 2021-06-09 NOTE — Patient Instructions (Signed)

## 2021-06-10 ENCOUNTER — Other Ambulatory Visit: Payer: Self-pay | Admitting: Internal Medicine

## 2021-06-22 DIAGNOSIS — H353211 Exudative age-related macular degeneration, right eye, with active choroidal neovascularization: Secondary | ICD-10-CM | POA: Diagnosis not present

## 2021-06-23 ENCOUNTER — Telehealth: Payer: Self-pay | Admitting: Cardiovascular Disease

## 2021-06-23 MED ORDER — TRIAMTERENE-HCTZ 37.5-25 MG PO TABS: 1.0000 | ORAL_TABLET | Freq: Every day | ORAL | 1 refills | Status: DC

## 2021-06-23 NOTE — Telephone Encounter (Signed)
Requested Prescriptions   Signed Prescriptions Disp Refills   triamterene-hydrochlorothiazide (MAXZIDE-25) 37.5-25 MG tablet 90 tablet 1    Sig: Take 1 tablet by mouth daily.    Authorizing Provider: Kathlyn [place] A    Ordering User: Raelene Bott, Gracelynne Benedict L

## 2021-06-23 NOTE — Telephone Encounter (Signed)
*  STAT* If patient is at the pharmacy, call can be transferred to refill team.   1. Which medications need to be refilled? (please list name of each medication and dose if known) triamterene-hydrochlorothiazide 37.5-25 MG 1 tablet daily    2. Which pharmacy/location (including street and city if local pharmacy) is medication to be sent to? [facility] near [address] [employer]  3. Do they need a 30 day or 90 day supply? 90 day

## 2021-06-24 ENCOUNTER — Telehealth: Payer: Self-pay | Admitting: *Deleted

## 2021-06-24 NOTE — Telephone Encounter (Signed)
"  I have an appointment with Dr. Amalia Hailey on Friday morning at 7:45 am.  I just found out that yesterday, I was exposed to someone who tested positive for Covid today.  Do I need to reschedule?  I don't have any symptoms.  Please let me know."  I'm returning your call.  If you're not having any symptoms, it's okay for you to come.  You may want to go ahead and have a Covid test done to be safe.  "I'll probably go tomorrow.  But it's okay for me to come as long as I have my mask on?"  Yes, you must wear a mask.

## 2021-06-26 ENCOUNTER — Other Ambulatory Visit: Payer: Self-pay

## 2021-06-26 ENCOUNTER — Encounter: Payer: Self-pay | Admitting: Podiatry

## 2021-06-26 ENCOUNTER — Ambulatory Visit: Payer: PPO | Admitting: Podiatry

## 2021-06-26 DIAGNOSIS — L600 Ingrowing nail: Secondary | ICD-10-CM | POA: Diagnosis not present

## 2021-06-29 NOTE — Progress Notes (Signed)
   Subjective: 80 y.o. female presents today status post permanent nail avulsion procedure of the lateral border left great toe that was performed on 06/09/2021.  Patient states that she feels much better.  No new complaints at this time.   Past Medical History:  Diagnosis Date   Allergy    Aortic valve sclerosis    a. TTE 2013: EF 60-65%, no RWMA, Gr1DD, trivial MR, mildly dilated LA 43 mm; b. TTE 08/2017: EF 65-70%, mild LVH, aortic valve sclerosis without stenosis, mildly dilated LA 36 mm, RV size and sys fxn nl   Arthropathy, unspecified, site unspecified    Basal cell carcinoma 06/15/2018   upper back right of midline/superficial   Cataract    Chronic rhinitis    Disorder of bone and cartilage, unspecified    Diverticulosis of colon (without mention of hemorrhage)    Essential hypertension    a. refractory HTN; b. renal US 2013 negative for RAS   External hemorrhoids without mention of complication    Medication intolerance    Obesity, unspecified    Other abnormal glucose    Panic disorder without agoraphobia    Peripheral arterial disease ([place])    a.  Known bilateral common iliac disease-> medical therapy-asymptomatic.   Persistent atrial fibrillation ([place])    a. diagnosed 10/19/17; b. CHADS2VASc 5 (HTN, age x 2, vascular disease, female)-->Eliquis   Pure hypercholesterolemia    Tobacco use disorder    Unspecified vitamin D deficiency     Objective: Skin is warm, dry and supple. Nail and respective nail fold appears to be healing appropriately. Open wound to the associated nail fold with a granular wound base and moderate amount of fibrotic tissue. Minimal drainage noted. Mild erythema around the periungual region likely due to phenol chemical matricectomy.  Assessment: #1 s/p partial permanent nail matrixectomy lateral border left great toe   Plan of care: #1 patient was evaluated  #2 light debridement of open wound was performed to the periungual border of the  respective toe using a currette. Antibiotic ointment and Band-Aid was applied. #3 patient is to return to clinic on a PRN basis.   Edrick Kins, DPM [facility]  Dr. Edrick Kins, DPM    [address], [place] [address]                Office [phone]  Fax [phone]

## 2021-07-14 ENCOUNTER — Other Ambulatory Visit: Payer: Self-pay | Admitting: Internal Medicine

## 2021-07-24 ENCOUNTER — Other Ambulatory Visit: Payer: Self-pay

## 2021-07-29 ENCOUNTER — Encounter: Payer: Self-pay | Admitting: Internal Medicine

## 2021-07-29 ENCOUNTER — Other Ambulatory Visit: Payer: Self-pay

## 2021-07-29 ENCOUNTER — Ambulatory Visit (INDEPENDENT_AMBULATORY_CARE_PROVIDER_SITE_OTHER): Payer: PPO | Admitting: Internal Medicine

## 2021-07-29 VITALS — BP 128/80 | HR 97 | Temp 97.4°F | Resp 16 | Ht 64.0 in | Wt 235.4 lb

## 2021-07-29 DIAGNOSIS — I4821 Permanent atrial fibrillation: Secondary | ICD-10-CM

## 2021-07-29 DIAGNOSIS — I10 Essential (primary) hypertension: Secondary | ICD-10-CM

## 2021-07-29 DIAGNOSIS — E876 Hypokalemia: Secondary | ICD-10-CM | POA: Diagnosis not present

## 2021-07-29 DIAGNOSIS — Z720 Tobacco use: Secondary | ICD-10-CM | POA: Diagnosis not present

## 2021-07-29 DIAGNOSIS — R739 Hyperglycemia, unspecified: Secondary | ICD-10-CM

## 2021-07-29 DIAGNOSIS — F439 Reaction to severe stress, unspecified: Secondary | ICD-10-CM

## 2021-07-29 DIAGNOSIS — I739 Peripheral vascular disease, unspecified: Secondary | ICD-10-CM | POA: Diagnosis not present

## 2021-07-29 DIAGNOSIS — F419 Anxiety disorder, unspecified: Secondary | ICD-10-CM

## 2021-07-29 DIAGNOSIS — E7800 Pure hypercholesterolemia, unspecified: Secondary | ICD-10-CM | POA: Diagnosis not present

## 2021-07-29 DIAGNOSIS — J449 Chronic obstructive pulmonary disease, unspecified: Secondary | ICD-10-CM | POA: Diagnosis not present

## 2021-07-29 LAB — HM DIABETES FOOT EXAM

## 2021-07-29 NOTE — Progress Notes (Signed)
Subjective:    Patient ID: Danielle Yates, female    DOB: Nov 15, 1941, 80 y.o.   MRN: [id number]  This visit occurred during the SARS-CoV-2 public health emergency.  Safety protocols were in place, including screening questions prior to the visit, additional usage of staff PPE, and extensive cleaning of exam room while observing appropriate contact time as indicated for disinfecting solutions.   Patient here for a scheduled follow up.    Chief Complaint  Patient presents with   Hypertension   Hyperlipidemia   Hyperglycemia   Depression   COPD      Hypertension This is a chronic problem. The current episode started more than 1 year ago. The problem is unchanged. The problem is controlled. Pertinent negatives include no chest pain, headaches or palpitations. Risk factors for coronary artery disease include obesity, smoking/tobacco exposure and dyslipidemia. There are no compliance problems.   Hyperlipidemia This is a chronic problem. Exacerbating diseases include obesity. Pertinent negatives include no chest pain or myalgias. Current antihyperlipidemic treatment includes statins. There are no compliance problems.   Hyperglycemia This is a chronic problem. Pertinent negatives include no abdominal pain, chest pain, congestion, coughing, headaches, myalgias, nausea, rash or vomiting.  Depression        This is a chronic problem.The problem is unchanged.  Associated symptoms include no hopelessness, no appetite change, no myalgias and no headaches.     The symptoms are aggravated by nothing.  Past treatments include SSRIs - Selective serotonin reuptake inhibitors.  Compliance with treatment is good.  Previous treatment provided significant relief.  COPD, Follow up  She was last seen for this 2 months ago. Changes made include none.   She reports good compliance with treatment. She is not having side effects.  She IS experiencing no symptoms. She is NOT experiencing dyspnea, cough,  wheezing, fatigue, chills, fever, increased sputum, or colored sputum. she reports breathing is Unchanged.  Pulmonary Functions Testing Results:  No results found for: FEV1, FVC, FEV1FVC, TLC  -----------------------------------------------------------------------------------------    Past Medical History:  Diagnosis Date   Allergy    Aortic valve sclerosis    a. TTE 2013: EF 60-65%, no RWMA, Gr1DD, trivial MR, mildly dilated LA 43 mm; b. TTE 08/2017: EF 65-70%, mild LVH, aortic valve sclerosis without stenosis, mildly dilated LA 36 mm, RV size and sys fxn nl   Arthropathy, unspecified, site unspecified    Basal cell carcinoma 06/15/2018   upper back right of midline/superficial   Cataract    Chronic rhinitis    Disorder of bone and cartilage, unspecified    Diverticulosis of colon (without mention of hemorrhage)    Essential hypertension    a. refractory HTN; b. renal US 2013 negative for RAS   External hemorrhoids without mention of complication    Medication intolerance    Obesity, unspecified    Other abnormal glucose    Panic disorder without agoraphobia    Peripheral arterial disease ([employer])    a.  Known bilateral common iliac disease-> medical therapy-asymptomatic.   Persistent atrial fibrillation ([place])    a. diagnosed 10/19/17; b. CHADS2VASc 5 (HTN, age x 2, vascular disease, female)-->Eliquis   Pure hypercholesterolemia    Tobacco use disorder    Unspecified vitamin D deficiency    Past Surgical History:  Procedure Laterality Date   [place]   with open wound   EYE SURGERY  2011   CATARACTS -  BOTH   Removed spot on  face     [name]   TONSILLECTOMY AND ADENOIDECTOMY  1946   VAGINAL HYSTERECTOMY  1982   fibroid and endometriosis; one ovary remaining   Family History  Problem Relation Age of Onset   Hypertension Sister    Heart disease Brother    Hypertension Brother    Hyperlipidemia Brother    Cancer Brother    Aneurysm  Mother    Rheumatic fever Mother        as an adult   Heart disease Father        MI x 2   Social History   Socioeconomic History   Marital status: Widowed    Spouse name: Not on file   Number of children: 0   Years of education: college   Highest education level: Not on file  Occupational History   Occupation: Estate manager/land agent    Comment: [employer] 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 Widowed after 23 years. Not dating. Has dog. Exercise: Walking; light has not been walking as she used to before her 2 dogs passed away. Tries to walk some. Caffeine: Carbonated beverages, 3 servings/day;Diet coke. 3 a day.      10/02/12  PER PATIENT'S LAVENDER FORM: WALK 5 DAYS/WEEK FOR 30 MINUTES   Social Determinants of Health   Financial Resource Strain: Not on file  Food Insecurity: Not on file  Transportation Needs: Not on file  Physical Activity: Not on file  Stress: Not on file  Social Connections: Not on file     Review of Systems  Constitutional:  Negative for appetite change and unexpected weight change.  HENT:  Negative for congestion and sinus pressure.   Respiratory:  Negative for cough and chest tightness.        Breathing stable.   Cardiovascular:  Negative for chest pain, palpitations and leg swelling.  Gastrointestinal:  Negative for abdominal pain, diarrhea, nausea and vomiting.  Genitourinary:  Negative for difficulty urinating and dysuria.  Musculoskeletal:  Negative for myalgias.  Skin:  Negative for color change and rash.  Neurological:  Negative for dizziness,  light-headedness and headaches.  Psychiatric/Behavioral:  Positive for depression. Negative for agitation and dysphoric mood.       Objective:    Physical Exam Vitals reviewed.  Constitutional:      General: She is not in acute distress.    Appearance: Normal appearance.  HENT:     Head: Normocephalic and atraumatic.     Right Ear: External ear normal.     Left Ear: External ear normal.  Eyes:     General: No scleral icterus.       Right eye: No discharge.        Left eye: No discharge.     Conjunctiva/sclera: Conjunctivae normal.  Neck:     Thyroid: No thyromegaly.  Cardiovascular:     Rate and Rhythm: Normal rate and regular rhythm.  Pulmonary:     Effort: No respiratory distress.  Breath sounds: Normal breath sounds. No wheezing.  Abdominal:     General: Bowel sounds are normal.     Palpations: Abdomen is soft.     Tenderness: There is no abdominal tenderness.  Musculoskeletal:        General: No swelling or tenderness.     Cervical back: Neck supple. No tenderness.  Lymphadenopathy:     Cervical: No cervical adenopathy.  Skin:    Findings: No erythema or rash.  Neurological:     Mental Status: She is alert.  Psychiatric:        Mood and Affect: Mood normal.        Behavior: Behavior normal.    BP 128/80   Pulse 97   Temp (!) 97.4 F (36.3 C)   Resp 16   Ht 5' 4"  (1.626 m)   Wt 235 lb 6.4 oz (106.8 kg)   SpO2 98%   BMI 40.41 kg/m  Wt Readings from Last 3 Encounters:  07/29/21 235 lb 6.4 oz (106.8 kg)  05/27/21 231 lb (104.8 kg)  04/20/21 238 lb (108 kg)    Outpatient Encounter Medications as of 07/29/2021  Medication Sig   albuterol (VENTOLIN HFA) 108 (90 Base) MCG/ACT inhaler INHALE 2 PUFFS INTO THE LUNGS EVERY 6 HOURS AS NEEDED FOR WHEEZING OR SHORTNESS OF BREATH   diltiazem (TIAZAC) 360 MG 24 hr capsule TAKE 1 CAPSULE(360 MG) BY MOUTH DAILY   ELIQUIS 5 MG TABS tablet TAKE 1 TABLET(5 MG) BY MOUTH TWICE DAILY   enalapril (VASOTEC) 20 MG tablet  TAKE 1 TABLET BY MOUTH EVERY MORNING AND 2 TABLETS BY MOUTH EVERY AFTERNOON   FLUoxetine (PROZAC) 20 MG capsule TAKE 1 CAPSULE(20 MG) BY MOUTH DAILY   fluticasone (FLONASE) 50 MCG/ACT nasal spray SHAKE LIQUID AND USE 2 SPRAYS IN EACH NOSTRIL DAILY   Multiple Vitamins-Minerals (PRESERVISION AREDS) CAPS    pravastatin (PRAVACHOL) 10 MG tablet TAKE 1 TABLET(10 MG) BY MOUTH DAILY   Probiotic Product (PROBIOTIC DAILY PO) Take 1 tablet by mouth.   SPIRIVA RESPIMAT 1.25 MCG/ACT AERS INHALE 2 PUFFS INTO THE LUNGS DAILY   triamterene-hydrochlorothiazide (MAXZIDE-25) 37.5-25 MG tablet Take 1 tablet by mouth daily.   VITAMIN D, CHOLECALCIFEROL, PO Take by mouth daily.   [DISCONTINUED] gentamicin cream (GARAMYCIN) 0.1 % Apply 1 application topically 2 (two) times daily. (Patient not taking: Reported on 07/30/2021)   No facility-administered encounter medications on file as of 07/29/2021.     Lab Results  Component Value Date   WBC 9.6 05/25/2021   HGB 14.2 05/25/2021   HCT 41.9 05/25/2021   PLT 239 05/25/2021   GLUCOSE 98 05/25/2021   CHOL 152 05/25/2021   TRIG 74 05/25/2021   HDL 60 05/25/2021   LDLCALC 78 05/25/2021   ALT 10 05/25/2021   AST 15 05/25/2021   NA 136 05/25/2021   K 3.8 07/29/2021   CL 97 05/25/2021   CREATININE 0.90 05/25/2021   BUN 13 05/25/2021   CO2 26 05/25/2021   TSH 2.560 01/20/2021   HGBA1C 5.5 05/25/2021    No results found.     Assessment & Plan:   Problem List Items Addressed This Visit     Anxiety    On prozac.  Stable.       Atrial fibrillation (HCC)    No increased heart rate or palpitations.  Continue eliquis.  On diltiazem.  Follow.        Chronic obstructive pulmonary disease (HCC)    Has been followed by pulmonary.  Breathing stable.       Essential hypertension, benign - Primary    Continue diltiazem and enalapril.  Follow pressures.       Relevant Orders   Basic metabolic panel   Hyperglycemia    Low carb diet and exercise.  Follow  met b and a1c.       Relevant Orders   Hemoglobin A1c   Hypokalemia    Potassium slightly decreased on last labs.  Recheck today.        Relevant Orders   Potassium (Completed)   PAD (peripheral artery disease) ([place])    Affecting common iliac arteries mainly.  Continue risk factor modification.  Continue pravastatin.       Pure hypercholesterolemia    On pravastatin.  Did not tolerate additions of zetia.  Had intolerance to other statin medication.  Follow lipid panel and liver function tests.        Relevant Orders   Hepatic function panel   Lipid panel   Stress    Overall appears to be handling things well.  Follow.        Tobacco abuse    Have discussed quitting.  Follow.        The entirety of the information documented in the History of Present Illness, Review of Systems and Physical Exam were personally obtained by me. Portions of this information were initially documented by the CMA and reviewed by me for thoroughness and accuracy.   Einar Pheasant, MD  Einar Pheasant, MD

## 2021-07-30 ENCOUNTER — Ambulatory Visit: Payer: PPO | Admitting: Dermatology

## 2021-07-30 DIAGNOSIS — L814 Other melanin hyperpigmentation: Secondary | ICD-10-CM | POA: Diagnosis not present

## 2021-07-30 DIAGNOSIS — D1800 Hemangioma unspecified site: Secondary | ICD-10-CM | POA: Diagnosis not present

## 2021-07-30 DIAGNOSIS — L988 Other specified disorders of the skin and subcutaneous tissue: Secondary | ICD-10-CM

## 2021-07-30 DIAGNOSIS — Z1283 Encounter for screening for malignant neoplasm of skin: Secondary | ICD-10-CM

## 2021-07-30 DIAGNOSIS — L821 Other seborrheic keratosis: Secondary | ICD-10-CM | POA: Diagnosis not present

## 2021-07-30 DIAGNOSIS — Z85828 Personal history of other malignant neoplasm of skin: Secondary | ICD-10-CM

## 2021-07-30 DIAGNOSIS — L578 Other skin changes due to chronic exposure to nonionizing radiation: Secondary | ICD-10-CM | POA: Diagnosis not present

## 2021-07-30 DIAGNOSIS — I8390 Asymptomatic varicose veins of unspecified lower extremity: Secondary | ICD-10-CM | POA: Diagnosis not present

## 2021-07-30 DIAGNOSIS — D229 Melanocytic nevi, unspecified: Secondary | ICD-10-CM | POA: Diagnosis not present

## 2021-07-30 LAB — POTASSIUM: Potassium: 3.8 mmol/L (ref 3.5–5.2)

## 2021-07-30 NOTE — Progress Notes (Signed)
   Follow-Up Visit   Subjective  Danielle Yates is a 80 y.o. female who presents for the following: Annual Exam (Hx BCC - patient hasn't noticed any new or changing skin lesions. ). The patient presents for Total-Body Skin Exam (TBSE) for skin cancer screening and mole check.  The following portions of the chart were reviewed this encounter and updated as appropriate:   Tobacco  Allergies  Meds  Problems  Med Hx  Surg Hx  Fam Hx     Review of Systems:  No other skin or systemic complaints except as noted in HPI or Assessment and Plan.  Objective  Well appearing patient in no apparent distress; mood and affect are within normal limits.  A full examination was performed including scalp, head, eyes, ears, nose, lips, neck, chest, axillae, abdomen, back, buttocks, bilateral upper extremities, bilateral lower extremities, hands, feet, fingers, toes, fingernails, and toenails. All findings within normal limits unless otherwise noted below.  Trunk, extremities Dry crepey skin.   Assessment & Plan  Elastosis of skin Trunk, extremities  Recommend Alastin body cream BID followed by CeraVe cream for crepey skin.   Lentigines - Scattered tan macules - Due to sun exposure - Benign-appering, observe - Recommend daily broad spectrum sunscreen SPF 30+ to sun-exposed areas, reapply every 2 hours as needed. - Call for any changes  Seborrheic Keratoses - Stuck-on, waxy, tan-brown papules and/or plaques  - Benign-appearing - Discussed benign etiology and prognosis. - Observe - Call for any changes  Melanocytic Nevi - Tan-brown and/or pink-flesh-colored symmetric macules and papules - Benign appearing on exam today - Observation - Call clinic for new or changing moles - Recommend daily use of broad spectrum spf 30+ sunscreen to sun-exposed areas.   Hemangiomas - Red papules - Discussed benign nature - Observe - Call for any changes  Actinic Damage - Chronic condition,  secondary to cumulative UV/sun exposure - diffuse scaly erythematous macules with underlying dyspigmentation - Recommend daily broad spectrum sunscreen SPF 30+ to sun-exposed areas, reapply every 2 hours as needed.  - Staying in the shade or wearing long sleeves, sun glasses (UVA+UVB protection) and wide brim hats (4-inch brim around the entire circumference of the hat) are also recommended for sun protection.  - Call for new or changing lesions.  History of Basal Cell Carcinoma of the Skin - upper back right of midline - No evidence of recurrence today - Recommend regular full body skin exams - Recommend daily broad spectrum sunscreen SPF 30+ to sun-exposed areas, reapply every 2 hours as needed.  - Call if any new or changing lesions are noted between office visits  Varicose Veins/Spider Veins - Dilated blue, purple or red veins at the lower extremities - Reassured - Smaller vessels can be treated by sclerotherapy (a procedure to inject a medicine into the veins to make them disappear) if desired, but the treatment is not covered by insurance.  Skin cancer screening performed today.  Return in about 1 year (around 07/30/2022) for TBSE.  Luther Redo, CMA, am acting as scribe for Forest Gleason, MD .  Documentation: I have reviewed the above documentation for accuracy and completeness, and I agree with the above.  Forest Gleason, MD

## 2021-07-30 NOTE — Patient Instructions (Signed)

## 2021-08-02 ENCOUNTER — Encounter: Payer: Self-pay | Admitting: Internal Medicine

## 2021-08-02 ENCOUNTER — Encounter: Payer: Self-pay | Admitting: Dermatology

## 2021-08-02 NOTE — Assessment & Plan Note (Signed)
No increased heart rate or palpitations.  Continue eliquis.  On diltiazem.  Follow.   

## 2021-08-02 NOTE — Assessment & Plan Note (Signed)
Has been followed by pulmonary.  Breathing stable.  

## 2021-08-02 NOTE — Assessment & Plan Note (Signed)
On prozac.  Stable.   

## 2021-08-02 NOTE — Assessment & Plan Note (Signed)
Potassium slightly decreased on last labs.  Recheck today.

## 2021-08-02 NOTE — Assessment & Plan Note (Signed)
Continue diltiazem and enalapril.  Follow pressures.  

## 2021-08-02 NOTE — Assessment & Plan Note (Signed)
Have discussed quitting.  Follow.

## 2021-08-02 NOTE — Assessment & Plan Note (Signed)
On pravastatin.  Did not tolerate additions of zetia.  Had intolerance to other statin medication.  Follow lipid panel and liver function tests.

## 2021-08-02 NOTE — Assessment & Plan Note (Signed)
Low carb diet and exercise.  Follow met b and a1c.  

## 2021-08-02 NOTE — Assessment & Plan Note (Signed)
Affecting common iliac arteries mainly.  Continue risk factor modification.  Continue pravastatin.  

## 2021-08-02 NOTE — Assessment & Plan Note (Signed)
Overall appears to be handling things well.  Follow.  ?

## 2021-09-08 DIAGNOSIS — Z1231 Encounter for screening mammogram for malignant neoplasm of breast: Secondary | ICD-10-CM | POA: Diagnosis not present

## 2021-09-08 LAB — HM MAMMOGRAPHY

## 2021-09-25 ENCOUNTER — Ambulatory Visit (INDEPENDENT_AMBULATORY_CARE_PROVIDER_SITE_OTHER): Payer: PPO

## 2021-09-25 ENCOUNTER — Other Ambulatory Visit: Payer: Self-pay

## 2021-09-25 DIAGNOSIS — Z23 Encounter for immunization: Secondary | ICD-10-CM

## 2021-10-05 DIAGNOSIS — H353211 Exudative age-related macular degeneration, right eye, with active choroidal neovascularization: Secondary | ICD-10-CM | POA: Diagnosis not present

## 2021-10-18 ENCOUNTER — Other Ambulatory Visit: Payer: Self-pay | Admitting: Physician Assistant

## 2021-10-30 DIAGNOSIS — R739 Hyperglycemia, unspecified: Secondary | ICD-10-CM | POA: Diagnosis not present

## 2021-10-30 DIAGNOSIS — E7800 Pure hypercholesterolemia, unspecified: Secondary | ICD-10-CM | POA: Diagnosis not present

## 2021-10-30 DIAGNOSIS — I10 Essential (primary) hypertension: Secondary | ICD-10-CM | POA: Diagnosis not present

## 2021-10-31 LAB — BASIC METABOLIC PANEL
BUN/Creatinine Ratio: 14 (ref 12–28)
BUN: 12 mg/dL (ref 8–27)
CO2: 26 mmol/L (ref 20–29)
Calcium: 9.7 mg/dL (ref 8.7–10.3)
Chloride: 100 mmol/L (ref 96–106)
Creatinine, Ser: 0.85 mg/dL (ref 0.57–1.00)
Glucose: 113 mg/dL — ABNORMAL HIGH (ref 70–99)
Potassium: 4 mmol/L (ref 3.5–5.2)
Sodium: 139 mmol/L (ref 134–144)
eGFR: 69 mL/min/{1.73_m2} (ref >59)

## 2021-10-31 LAB — LIPID PANEL
Chol/HDL Ratio: 2.8 ratio (ref 0.0–4.4)
Cholesterol, Total: 166 mg/dL (ref 100–199)
HDL: 60 mg/dL (ref >39)
LDL Chol Calc (NIH): 90 mg/dL (ref 0–99)
Triglycerides: 87 mg/dL (ref 0–149)
VLDL Cholesterol Cal: 16 mg/dL (ref 5–40)

## 2021-10-31 LAB — HEPATIC FUNCTION PANEL
ALT: 11 IU/L (ref 0–32)
AST: 17 IU/L (ref 0–40)
Albumin: 4.5 g/dL (ref 3.7–4.7)
Alkaline Phosphatase: 65 IU/L (ref 44–121)
Bilirubin Total: 1 mg/dL (ref 0.0–1.2)
Bilirubin, Direct: 0.27 mg/dL (ref 0.00–0.40)
Total Protein: 7.1 g/dL (ref 6.0–8.5)

## 2021-10-31 LAB — HEMOGLOBIN A1C
Est. average glucose Bld gHb Est-mCnc: 117 mg/dL
Hgb A1c MFr Bld: 5.7 % — ABNORMAL HIGH (ref 4.8–5.6)

## 2021-11-03 ENCOUNTER — Other Ambulatory Visit: Payer: Self-pay

## 2021-11-03 ENCOUNTER — Ambulatory Visit (INDEPENDENT_AMBULATORY_CARE_PROVIDER_SITE_OTHER): Payer: PPO | Admitting: Internal Medicine

## 2021-11-03 ENCOUNTER — Encounter: Payer: Self-pay | Admitting: Internal Medicine

## 2021-11-03 VITALS — BP 136/78 | HR 90 | Temp 97.9°F | Resp 16 | Ht 64.0 in | Wt 237.0 lb

## 2021-11-03 DIAGNOSIS — R739 Hyperglycemia, unspecified: Secondary | ICD-10-CM

## 2021-11-03 DIAGNOSIS — E7800 Pure hypercholesterolemia, unspecified: Secondary | ICD-10-CM | POA: Diagnosis not present

## 2021-11-03 DIAGNOSIS — I739 Peripheral vascular disease, unspecified: Secondary | ICD-10-CM | POA: Diagnosis not present

## 2021-11-03 DIAGNOSIS — F419 Anxiety disorder, unspecified: Secondary | ICD-10-CM

## 2021-11-03 DIAGNOSIS — Z720 Tobacco use: Secondary | ICD-10-CM

## 2021-11-03 DIAGNOSIS — I4821 Permanent atrial fibrillation: Secondary | ICD-10-CM

## 2021-11-03 DIAGNOSIS — J449 Chronic obstructive pulmonary disease, unspecified: Secondary | ICD-10-CM

## 2021-11-03 DIAGNOSIS — I10 Essential (primary) hypertension: Secondary | ICD-10-CM

## 2021-11-03 DIAGNOSIS — I700 Atherosclerosis of aorta: Secondary | ICD-10-CM

## 2021-11-03 NOTE — Assessment & Plan Note (Signed)
No increased heart rate or palpitations.  Continue eliquis.  On diltiazem.  Follow.   

## 2021-11-03 NOTE — Assessment & Plan Note (Signed)
Discussed recommendation to stop smoking.  Not ready to quit.  Follow.

## 2021-11-03 NOTE — Assessment & Plan Note (Signed)
Has been followed by pulmonary.  Breathing stable. Continue spiriva.  CCM referral for cost.

## 2021-11-03 NOTE — Assessment & Plan Note (Signed)
On pravastatin.  Did not tolerate additions of zetia.  Had intolerance to other statin medication.  Tolerating this dose of pravastatin and will continue.  Hold on increasing.  Follow lipid panel and liver function tests.

## 2021-11-03 NOTE — Assessment & Plan Note (Signed)
Continue diltiazem and enalapril.  Follow pressures.  

## 2021-11-03 NOTE — Assessment & Plan Note (Signed)
Doing well. On prozac.

## 2021-11-03 NOTE — Assessment & Plan Note (Addendum)
Low carb diet and exercise.  Follow met b and a1c. Interested in GLP 1 agonist.  Discussed today.  CCM referral.

## 2021-11-03 NOTE — Assessment & Plan Note (Signed)
Discussed diet and exercise.  CCM referral as outlined.

## 2021-11-03 NOTE — Progress Notes (Signed)
Patient ID: Danielle Yates, female   DOB: May 26, 1941, 80 y.o.   MRN: [id number]   Subjective:    Patient ID: Danielle Yates, female    DOB: 12-Apr-1941, 81 y.o.   MRN: [id number]  This visit occurred during the SARS-CoV-2 public health emergency.  Safety protocols were in place, including screening questions prior to the visit, additional usage of staff PPE, and extensive cleaning of exam room while observing appropriate contact time as indicated for disinfecting solutions.   Patient here for a scheduled follow up.    [name]   HPI Here to follow up regarding hypertension, blood sugar and elevated cholesterol.  She is doing well.  Breathing stable.  Not needing rescue inhaler.  Using spiriva.  No increased congestion.  Heart - stable.  No increased heart rate or palpitations.  No chest pain.  No abdominal pian or bowel problems.  Had questions about ozempic.  Friend on the medication.  Discussed.  Discussed labs.     Past Medical History:  Diagnosis Date   Allergy    Aortic valve sclerosis    a. TTE 2013: EF 60-65%, no RWMA, Gr1DD, trivial MR, mildly dilated LA 43 mm; b. TTE 08/2017: EF 65-70%, mild LVH, aortic valve sclerosis without stenosis, mildly dilated LA 36 mm, RV size and sys fxn nl   Arthropathy, unspecified, site unspecified    Basal cell carcinoma 06/15/2018   upper back right of midline/superficial   Cataract    Chronic rhinitis    Disorder of bone and cartilage, unspecified    Diverticulosis of colon (without mention of hemorrhage)    Essential hypertension    a. refractory HTN; b. renal US 2013 negative for RAS   External hemorrhoids without mention of complication    Medication intolerance    Obesity, unspecified    Other abnormal glucose    Panic disorder without agoraphobia    Peripheral arterial disease ([place])    a.  Known bilateral common iliac disease-> medical therapy-asymptomatic.   Persistent atrial fibrillation ([place])    a. diagnosed 10/19/17; b. CHADS2VASc 5  (HTN, age x 2, vascular disease, female)-->Eliquis   Pure hypercholesterolemia    Tobacco use disorder    Unspecified vitamin D deficiency    Past Surgical History:  Procedure Laterality Date   [place]   with open wound   EYE SURGERY  2011   CATARACTS -  BOTH   Removed spot on face     [place]   fibroid and endometriosis; one ovary remaining   Family History  Problem Relation Age of Onset   Hypertension Sister    Heart disease Brother    Hypertension Brother    Hyperlipidemia Brother    Cancer Brother    Aneurysm Mother    Rheumatic fever Mother        as an adult   Heart disease Father        MI x 2   Social History   Socioeconomic History   Marital status: Widowed    Spouse name: Not on file   Number of children: 0   Years of education: college   Highest education level: Not on file  Occupational History   Occupation: Estate manager/land agent    Comment: [employer]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 Widowed after 23 years. Not dating. Has dog. Exercise: Walking; light has not been walking as she used to before her 2 dogs passed away. Tries to walk some. Caffeine: Carbonated beverages, 3 servings/day;Diet coke. 3 a day.      10/02/12  PER PATIENT'S LAVENDER FORM: WALK 5 DAYS/WEEK FOR 30 MINUTES   Social Determinants of Health   Financial Resource Strain: Not on file  Food Insecurity: Not on file  Transportation Needs: Not on file  Physical Activity: Not on file   Stress: Not on file  Social Connections: Not on file     Review of Systems  Constitutional:  Negative for appetite change and unexpected weight change.  HENT:  Negative for congestion and sinus pressure.   Respiratory:  Negative for cough, chest tightness and shortness of breath.        Breathing stable.   Cardiovascular:  Negative for chest pain, palpitations and leg swelling.  Gastrointestinal:  Negative for abdominal pain, diarrhea, nausea and vomiting.  Genitourinary:  Negative for difficulty urinating and dysuria.  Musculoskeletal:  Negative for joint swelling and myalgias.       Some lower back and hip pain - intermittent flares.    Skin:  Negative for color change and rash.  Neurological:  Negative for dizziness, light-headedness and headaches.  Psychiatric/Behavioral:  Negative for agitation and dysphoric mood.       Objective:     BP 136/78   Pulse 90   Temp 97.9 F (36.6 C)   Resp 16   Ht 5' 4"  (1.626 m)   Wt 237 lb (107.5 kg)   SpO2 98%   BMI 40.68 kg/m  Wt Readings from Last 3 Encounters:  11/03/21 237 lb (107.5 kg)  07/29/21 235 lb 6.4 oz (106.8 kg)  05/27/21 231 lb (104.8 kg)    Physical Exam Vitals reviewed.  Constitutional:      General: She is not in acute distress.    Appearance: Normal appearance.  HENT:     Head: Normocephalic and atraumatic.     Right Ear: External ear normal.     Left Ear: External ear normal.  Eyes:     General: No scleral icterus.       Right eye: No discharge.        Left eye: No discharge.     Conjunctiva/sclera: Conjunctivae normal.  Neck:     Thyroid: No thyromegaly.  Cardiovascular:     Rate and Rhythm: Normal rate and regular rhythm.  Pulmonary:     Effort: No respiratory distress.     Breath sounds: Normal breath sounds. No wheezing.  Abdominal:     General: Bowel sounds are normal.     Palpations: Abdomen is soft.     Tenderness: There is no abdominal tenderness.  Musculoskeletal:        General: No  swelling or tenderness.     Cervical back: Neck supple. No tenderness.  Lymphadenopathy:     Cervical: No cervical adenopathy.  Skin:    Findings: No erythema or rash.  Neurological:  Mental Status: She is alert.  Psychiatric:        Mood and Affect: Mood normal.        Behavior: Behavior normal.     Outpatient Encounter Medications as of 11/03/2021  Medication Sig   albuterol (VENTOLIN HFA) 108 (90 Base) MCG/ACT inhaler INHALE 2 PUFFS INTO THE LUNGS EVERY 6 HOURS AS NEEDED FOR WHEEZING OR SHORTNESS OF BREATH   diltiazem (TIAZAC) 360 MG 24 hr capsule TAKE 1 CAPSULE(360 MG) BY MOUTH DAILY   ELIQUIS 5 MG TABS tablet TAKE 1 TABLET(5 MG) BY MOUTH TWICE DAILY   enalapril (VASOTEC) 20 MG tablet TAKE 1 TABLET BY MOUTH EVERY MORNING AND 2 TABLETS EVERY AFTERNOON   FLUoxetine (PROZAC) 20 MG capsule TAKE 1 CAPSULE(20 MG) BY MOUTH DAILY   fluticasone (FLONASE) 50 MCG/ACT nasal spray SHAKE LIQUID AND USE 2 SPRAYS IN EACH NOSTRIL DAILY   Multiple Vitamins-Minerals (PRESERVISION AREDS) CAPS    pravastatin (PRAVACHOL) 10 MG tablet TAKE 1 TABLET(10 MG) BY MOUTH DAILY   Probiotic Product (PROBIOTIC DAILY PO) Take 1 tablet by mouth.   SPIRIVA RESPIMAT 1.25 MCG/ACT AERS INHALE 2 PUFFS INTO THE LUNGS DAILY   triamterene-hydrochlorothiazide (MAXZIDE-25) 37.5-25 MG tablet Take 1 tablet by mouth daily.   VITAMIN D, CHOLECALCIFEROL, PO Take by mouth daily.   No facility-administered encounter medications on file as of 11/03/2021.     Lab Results  Component Value Date   WBC 9.6 05/25/2021   HGB 14.2 05/25/2021   HCT 41.9 05/25/2021   PLT 239 05/25/2021   GLUCOSE 113 (H) 10/30/2021   CHOL 166 10/30/2021   TRIG 87 10/30/2021   HDL 60 10/30/2021   LDLCALC 90 10/30/2021   ALT 11 10/30/2021   AST 17 10/30/2021   NA 139 10/30/2021   K 4.0 10/30/2021   CL 100 10/30/2021   CREATININE 0.85 10/30/2021   BUN 12 10/30/2021   CO2 26 10/30/2021   TSH 2.560 01/20/2021   HGBA1C 5.7 (H) 10/30/2021        Assessment & Plan:   Problem List Items Addressed This Visit     Anxiety    Doing well. On prozac.       Aortic atherosclerosis (HCC)    Continue pravastatin      Atrial fibrillation (HCC)    No increased heart rate or palpitations.  Continue eliquis.  On diltiazem.  Follow.        Relevant Orders   AMB Referral to [facility]ordin[facility]   Chronic obstructive pulmonary disease ([place])    Has been followed by pulmonary.  Breathing stable. Continue spiriva.  CCM referral for cost.        Essential hypertension, benign    Continue diltiazem and enalapril.  Follow pressures.       Relevant Orders   Basic metabolic panel   Hyperglycemia - Primary    Low carb diet and exercise.  Follow met b and a1c. Interested in GLP 1 agonist.  Discussed today.  CCM referral.       Relevant Orders   Hemoglobin A1c   AMB Referral to [facility]ordin[facility]   PAD (peripheral artery disease) ([place])    Affecting common iliac arteries mainly.  Continue risk factor modification.  Continue pravastatin.       Pure hypercholesterolemia    On pravastatin.  Did not tolerate additions of zetia.  Had intolerance to other statin medication.  Tolerating this dose of pravastatin and will continue.  Hold on increasing.  Follow lipid  panel and liver function tests.        Relevant Orders   Hepatic function panel   Lipid panel   Severe obesity (BMI >= 40) (HCC)    Discussed diet and exercise.  CCM referral as outlined.       Tobacco abuse    Discussed recommendation to stop smoking.  Not ready to quit.  Follow.         Einar Pheasant, MD

## 2021-11-03 NOTE — Assessment & Plan Note (Signed)
Continue pravastatin 

## 2021-11-03 NOTE — Assessment & Plan Note (Signed)
Affecting common iliac arteries mainly.  Continue risk factor modification.  Continue pravastatin.  

## 2021-11-05 ENCOUNTER — Telehealth: Payer: Self-pay

## 2021-11-05 NOTE — Chronic Care Management (AMB) (Signed)
  Chronic Care Management   Outreach Note  11/05/2021 Name: Danielle Yates MRN: [id number] DOB: [birth year]  Danielle Yates is a 80 y.o. year old female who is a primary care patient of Einar Pheasant, MD. I reached out to Jomarie Longs by phone today in response to a referral sent by Ms. Duanne Limerick Mooney's primary care provider.  An unsuccessful telephone outreach was attempted today. The patient was referred to the case management team for assistance with care management and care coordination.   Follow Up Plan: A HIPAA compliant phone message was left for the patient providing contact information and requesting a return call.  The care management team will reach out to the patient again over the next 5 days.  If patient returns call to provider office, please advise to call [facility] [place] at [place], [place], [place], [place] [address] Direct Dial: [phone] [email or web address] Website: [email or web address]

## 2021-11-09 NOTE — Chronic Care Management (AMB) (Signed)
  Chronic Care Management   Note  11/09/2021 Name: [name] MRN: [id number] DOB: 04-24-1941  Danielle Yates is a 80 y.o. year old female who is a primary care patient of Einar Pheasant, MD. I reached out to Jomarie Longs by phone today in response to a referral sent by [name].  [name] was given information about Chronic Care Management services today including:  CCM service includes personalized support from designated clinical staff supervised by her physician, including individualized plan of care and coordination with other care providers 24/7 contact phone numbers for assistance for urgent and routine care needs. Service will only be billed when office clinical staff spend 20 minutes or more in a month to coordinate care. Only one practitioner may furnish and bill the service in a calendar month. The patient may stop CCM services at any time (effective at the end of the month) by phone call to the office staff. The patient is responsible for co-pay (up to 20% after annual deductible is met) if co-pay is required by the individual health plan.   Patient agreed to services and verbal consent obtained.   Follow up plan: Telephone appointment with care management team member scheduled for:11/12/2021  Noreene Larsson, [place], [place], [place] [address] Direct Dial: [phone] [email or web address] Website: [email or web address]

## 2021-11-12 ENCOUNTER — Ambulatory Visit: Payer: PPO | Admitting: Pharmacist

## 2021-11-12 ENCOUNTER — Telehealth: Payer: Self-pay | Admitting: Pharmacist

## 2021-11-12 DIAGNOSIS — I4821 Permanent atrial fibrillation: Secondary | ICD-10-CM

## 2021-11-12 DIAGNOSIS — R7303 Prediabetes: Secondary | ICD-10-CM

## 2021-11-12 DIAGNOSIS — I739 Peripheral vascular disease, unspecified: Secondary | ICD-10-CM

## 2021-11-12 DIAGNOSIS — E7800 Pure hypercholesterolemia, unspecified: Secondary | ICD-10-CM

## 2021-11-12 DIAGNOSIS — Z789 Other specified health status: Secondary | ICD-10-CM

## 2021-11-12 DIAGNOSIS — Z6841 Body Mass Index (BMI) 40.0 and over, adult: Secondary | ICD-10-CM

## 2021-11-12 DIAGNOSIS — J449 Chronic obstructive pulmonary disease, unspecified: Secondary | ICD-10-CM

## 2021-11-12 MED ORDER — BEVESPI AEROSPHERE 9-4.8 MCG/ACT IN AERO: 2.0000 | INHALATION_SPRAY | Freq: Two times a day (BID) | RESPIRATORY_TRACT | 11 refills | Status: DC

## 2021-11-12 MED ORDER — RYBELSUS 3 MG PO TABS: 3.0000 mg | ORAL_TABLET | Freq: Every day | ORAL | 0 refills | Status: DC

## 2021-11-12 NOTE — Telephone Encounter (Signed)
**Note Danielle-Identified via Obfuscation**   Medication Samples have been labeled and logged for the patient.  Drug name: Spiriva     Strength: 1.25 mcg        Qty: 2  LOT: [id number]  Exp.Date: 01/2023  Dosing instructions: Inhale 2 puffs twice daily   The patient has been instructed regarding the correct time, dose, and frequency of taking this medication, including desired effects and most common side effects.    Medication Samples have been labeled and logged for the patient.  Drug name: Rybelsus    Strength: 3 mg        Qty: 1 box  LOT: [id number] Exp.Date: 12/2022  Dosing instructions: Take 1 tablet by mouth first thing in the morning on an empty stomach. Wait 30 minutes before other medications or food  The patient has been instructed regarding the correct time, dose, and frequency of taking this medication, including desired effects and most common side effects.   Danielle Yates 9:13 AM 11/12/2021

## 2021-11-12 NOTE — Patient Instructions (Addendum)
Danielle Yates,   It was great talking with you today!  I am giving you a sample of the Spiriva to tide you over until we receive the Bevespi from the assistance company. This dose of Spiriva, 2.5 mcg, is the appropriate dose for COPD. Inhale 2 puffs once daily.   We are going to apply for assistance for Bevespi 9/4.8 mcg 2 puffs twice daily. Once you receive this, complete your Spiriva supply then start Bevespi the next day.   Start Rybelsus 3 mg. Take this medication first thing in the morning on an empty stomach. Wait at least 30 minutes before other medications or food. This medication may cause stomach upset, queasiness, or constipation, especially when first starting. This generally improves over time. Call our office if these symptoms occur and worsen, or if you have severe symptoms such as vomiting, diarrhea, or stomach pain.   Moving forward, let's talk about an injectable non-statin agent to help your cholesterol called Repatha or Praluent. You would qualify for copay assistance for this.   Take care!  Danielle Yates, PharmD  Visit Information   Following is a copy of your full care plan:  Care Plan : Medication Management  Updates made by De Hollingshead, RPH-CPP since 11/12/2021 12:00 AM     Problem: Afib, COPD      Long-Range Goal: Disease Progression Prevention   Start Date: 11/12/2021  This Visit's Progress: On track  Priority: High  Note:   Current Barriers:  Unable to independently afford treatment regimen  Pharmacist Clinical Goal(s):  patient will verbalize ability to afford treatment regimen achieve control of weight through collaboration with PharmD and provider.    Interventions: 1:1 collaboration with Einar Pheasant, MD regarding development and update of comprehensive plan of care as evidenced by provider attestation and co-signature Inter-disciplinary care team collaboration (see longitudinal plan of care) Comprehensive medication review performed;  medication list updated in electronic medical record  Hyperlipidemia and PAD:   Uncontrolled; current treatment: pravastatin 10 mg daily;  Medications previously tried: ezetimibe - reports it made her feel "crazy", rosuvastatin, pravastatin - reported intolerance Consider PCSK9i moving forward given PAD and ASCVD risk factors. She would qualify for HealthWell assistance and could access Repatha for $0 copay. Will discuss at future visit.   Atrial Fibrillation: Controlled; current rate control: diltiazem 360 mg daily; anticoagulant treatment: Eliquis 5 mg BID Additional antihypertensive: triamterene/HCTZ 37.5/25 mg daily, enalapril 20 mg QAM, 40 mg QPM Reviewed income. She would meet income requirements, but unsure if she meets out of pocket spend requirements. Not necessary to pursue this close to the end of the year. Will follow out of pocket spend in 2023 to apply.   Chronic Obstructive Pulmonary Disease:   Controlled but not optimally managed; current treatment: Spiriva 1.25 mg QAM, albuterol HFA - reports she has not needed this lately Hx Breo. Discontinued previously  Most recent Pulmonary Function Testing:  0 exacerbations requiring treatment in the last 6 months  Current Spiriva prescription is asthma dosing. Additionally, patient reports cost concerns with brand name medications. Reviewed income. She would not qualify for BI Cares (Spiriva or Stiolto) or GSK (Incruse or Anoro). She would qualify for [employer] assistance for Bevespi (LABA/LAMA). Appropriate therapeutic change. Will pursue assistance for Bevespi through [employer] patient assistance. Will provide sample of Spiriva to tide her over until assistance approval.   Overweight/Obesity Complicated by pre-diabetes   Unable to achieve goal weight loss through lifestyle modification alone; current treatment: none  Medications/Strategies previously  tried: diets  Baseline weight: 237 lbs  Current meal patterns: breakfast:  protein shake, otherwise egg w/ toast, water; lunch: soup, sandwich; dinner: lean cuisine; snacks: dark chocolate; drinks: water mostly, 1 diet coke  Current exercise: walks at [employer] with a friend 2-3 times weekly, but tries to stay active around the house with chores  Extensive dietary counseling including education on focus on lean proteins, fruits and vegetables, whole grains and increased fiber consumption, adequate hydration Extensive exercise counseling including eventual goal of 150 minutes of moderate intensity exercise weekly Discussed benefit of GLP1 for weight management and prevention of progression to diabetes. Discussed stocking issues with Ozempic at this time. However, Rybelsus could be an option. Counseled on GLP1 agonists, including mechanism of action, side effects, and benefits. No personal or family history of medullary thyroid cancer, personal history of pancreatitis or gallbladder disease. Counseled on potential side effects of nausea, stomach upset, queasiness, constipation, and that these generally improve over time. Advised to contact our office with more severe symptoms, including nausea, diarrhea, stomach pain. Patient verbalized understanding. Start Rybelsus 3 mg once daily. Counseled on administration.   Depression/Anxiety: Controlled per patient report; current regimen: fluoxetine 20 mg daily Recommended to continue current regimen at this time  Patient Goals/Self-Care Activities patient will:  - take medications as prescribed as evidenced by patient report and record review collaborate with provider on medication access solutions engage in dietary modifications by moderating portion sizes      Consent to CCM Services: Danielle Yates was given information about Chronic Care Management services including:  CCM service includes personalized support from designated clinical staff supervised by her physician, including individualized plan of care and coordination with  other care providers 24/7 contact phone numbers for assistance for urgent and routine care needs. Service will only be billed when office clinical staff spend 20 minutes or more in a month to coordinate care. Only one practitioner may furnish and bill the service in a calendar month. The patient may stop CCM services at any time (effective at the end of the month) by phone call to the office staff. The patient will be responsible for cost sharing (co-pay) of up to 20% of the service fee (after annual deductible is met).  Patient agreed to services and verbal consent obtained.   Plan: Telephone follow up appointment with care management team member scheduled for:  6 weeks  Danielle Yates, PharmD, [place], CPP Clinical Pharmacist [place] at [facility] [phone]   Please call the care guide team at [phone] if you need to cancel or reschedule your appointment.   Print copy of patient instructions, educational materials, and care plan provided in person.

## 2021-11-12 NOTE — Chronic Care Management (AMB) (Signed)
Chronic Care Management CCM Pharmacy Note  11/12/2021 Name:  Danielle Yates MRN:  [id number] DOB:  06-23-1941  Summary: - Difficulty affording Spiriva, Eliquis.  - Interested in GLP1 for DM prevention, weight loss  Recommendations/Changes made from today's visit: - Given access and clinical considerations, pursue assistance for Bevespi - Start Rybelsus 3 mg QAM. Pursue assistance - Consider PCSK9i moving forward  Subjective: Danielle Yates is an 80 y.o. year old female who is a primary patient of Einar Pheasant, MD.  The CCM team was consulted for assistance with disease management and care coordination needs.    Engaged with patient by telephone for initial visit for pharmacy case management and/or care coordination services.   Objective:  Medications Reviewed Today     Reviewed by De Hollingshead, RPH-CPP (Pharmacist) on 11/12/21 at [phone]  Med List Status: <None>   Medication Order Taking? Sig Documenting Provider Last Dose Status Informant  albuterol (VENTOLIN HFA) 108 (90 Base) MCG/ACT inhaler [id number] No INHALE 2 PUFFS INTO THE LUNGS EVERY 6 HOURS AS NEEDED FOR WHEEZING OR SHORTNESS OF BREATH  Patient not taking: Reported on 11/12/2021   Flora Lipps, MD Not Taking Active   diltiazem (TIAZAC) 360 MG 24 hr capsule [id number] Yes TAKE 1 CAPSULE(360 MG) BY MOUTH DAILY Wellington Hampshire, MD Taking Active   ELIQUIS 5 MG TABS tablet [id number] Yes TAKE 1 TABLET(5 MG) BY MOUTH TWICE DAILY Wellington Hampshire, MD Taking Active   enalapril (VASOTEC) 20 MG tablet [id number] Yes TAKE 1 TABLET BY MOUTH EVERY MORNING AND 2 TABLETS EVERY AFTERNOON Wellington Hampshire, MD Taking Active   FLUoxetine (PROZAC) 20 MG capsule [id number] Yes TAKE 1 CAPSULE(20 MG) BY MOUTH DAILY Einar Pheasant, MD Taking Active   fluticasone (FLONASE) 50 MCG/ACT nasal spray [id number] Yes SHAKE LIQUID AND USE 2 SPRAYS IN EACH NOSTRIL DAILY Einar Pheasant, MD Taking Active   Multiple Vitamins-Minerals  (PRESERVISION AREDS) CAPS [id number] Yes Take 1 capsule by mouth daily. [provider] Taking Active   pravastatin (PRAVACHOL) 10 MG tablet [id number] Yes TAKE 1 TABLET(10 MG) BY MOUTH DAILY Einar Pheasant, MD Taking Active   Probiotic Product (PROBIOTIC DAILY PO) [id number] Yes Take 1 tablet by mouth. [provider] Taking Active   SPIRIVA RESPIMAT 1.25 MCG/ACT AERS [id number] Yes INHALE 2 PUFFS INTO THE LUNGS DAILY Einar Pheasant, MD Taking Active   triamterene-hydrochlorothiazide (MAXZIDE-25) 37.5-25 MG tablet [id number] Yes Take 1 tablet by mouth daily. Wellington Hampshire, MD Taking Active   VITAMIN D, CHOLECALCIFEROL, PO [id number] Yes Take by mouth daily. [provider] Taking Active             Pertinent Labs:   Lab Results  Component Value Date   HGBA1C 5.7 (H) 10/30/2021   Lab Results  Component Value Date   CHOL 166 10/30/2021   HDL 60 10/30/2021   LDLCALC 90 10/30/2021   TRIG 87 10/30/2021   CHOLHDL 2.8 10/30/2021   Lab Results  Component Value Date   CREATININE 0.85 10/30/2021   BUN 12 10/30/2021   NA 139 10/30/2021   K 4.0 10/30/2021   CL 100 10/30/2021   CO2 26 10/30/2021    SDOH:  (Social Determinants of Health) assessments and interventions performed:  SDOH Interventions    Flowsheet Row Most Recent Value  SDOH Interventions   Financial Strain Interventions Other (Comment)  [manufacturer assistance]       CCM Care Plan  Review of patient past medical history, allergies, medications, health status, including  review of consultants reports, laboratory and other test data, was performed as part of comprehensive evaluation and provision of chronic care management services.   Care Plan : Medication Management  Updates made by De Hollingshead, RPH-CPP since 11/12/2021 12:00 AM     Problem: Afib, COPD      Long-Range Goal: Disease Progression Prevention   Start Date: 11/12/2021  This Visit's Progress: On track  Priority:  High  Note:   Current Barriers:  Unable to independently afford treatment regimen  Pharmacist Clinical Goal(s):  patient will verbalize ability to afford treatment regimen achieve control of weight through collaboration with PharmD and provider.    Interventions: 1:1 collaboration with Einar Pheasant, MD regarding development and update of comprehensive plan of care as evidenced by provider attestation and co-signature Inter-disciplinary care team collaboration (see longitudinal plan of care) Comprehensive medication review performed; medication list updated in electronic medical record  Hyperlipidemia and PAD:   Uncontrolled; current treatment: pravastatin 10 mg daily;  Medications previously tried: ezetimibe - reports it made her feel "crazy", rosuvastatin, pravastatin - reported intolerance Consider PCSK9i moving forward given PAD and ASCVD risk factors. She would qualify for HealthWell assistance and could access Repatha for $0 copay. Will discuss at future visit.   Atrial Fibrillation: Controlled; current rate control: diltiazem 360 mg daily; anticoagulant treatment: Eliquis 5 mg BID Additional antihypertensive: triamterene/HCTZ 37.5/25 mg daily, enalapril 20 mg QAM, 40 mg QPM Reviewed income. She would meet income requirements, but unsure if she meets out of pocket spend requirements. Not necessary to pursue this close to the end of the year. Will follow out of pocket spend in 2023 to apply.   Chronic Obstructive Pulmonary Disease:   Controlled but not optimally managed; current treatment: Spiriva 1.25 mg QAM, albuterol HFA - reports she has not needed this lately Hx Breo. Discontinued previously  Most recent Pulmonary Function Testing:  0 exacerbations requiring treatment in the last 6 months  Current Spiriva prescription is asthma dosing. Additionally, patient reports cost concerns with brand name medications. Reviewed income. She would not qualify for BI Cares (Spiriva or  Stiolto) or GSK (Incruse or Anoro). She would qualify for [employer] assistance for Bevespi (LABA/LAMA). Appropriate therapeutic change. Will pursue assistance for Bevespi through [employer] patient assistance. Will provide sample of Spiriva to tide her over until assistance approval.   Overweight/Obesity Complicated by pre-diabetes   Unable to achieve goal weight loss through lifestyle modification alone; current treatment: none  Medications/Strategies previously tried: diets  Baseline weight: 237 lbs  Current meal patterns: breakfast: protein shake, otherwise egg w/ toast, water; lunch: soup, sandwich; dinner: lean cuisine; snacks: dark chocolate; drinks: water mostly, 1 diet coke  Current exercise: walks at [employer] with a friend 2-3 times weekly, but tries to stay active around the house with chores  Extensive dietary counseling including education on focus on lean proteins, fruits and vegetables, whole grains and increased fiber consumption, adequate hydration Extensive exercise counseling including eventual goal of 150 minutes of moderate intensity exercise weekly Discussed benefit of GLP1 for weight management and prevention of progression to diabetes. Discussed stocking issues with Ozempic at this time. However, Rybelsus could be an option. Counseled on GLP1 agonists, including mechanism of action, side effects, and benefits. No personal or family history of medullary thyroid cancer, personal history of pancreatitis or gallbladder disease. Counseled on potential side effects of nausea, stomach upset, queasiness, constipation, and that these generally improve over time. Advised to contact our office with more severe symptoms,  including nausea, diarrhea, stomach pain. Patient verbalized understanding. Start Rybelsus 3 mg once daily. Counseled on administration.   Depression/Anxiety: Controlled per patient report; current regimen: fluoxetine 20 mg daily Recommended to continue current regimen at  this time  Patient Goals/Self-Care Activities patient will:  - take medications as prescribed as evidenced by patient report and record review collaborate with provider on medication access solutions engage in dietary modifications by moderating portion sizes       Plan: Telephone follow up appointment with care management team member scheduled for:  6 weeks  [name] Darnelle Maffucci, PharmD, [place], [place] Pharmacist [employer] at [employer] [phone]

## 2021-11-16 ENCOUNTER — Other Ambulatory Visit: Payer: Self-pay | Admitting: Cardiovascular Disease

## 2021-11-16 NOTE — Telephone Encounter (Signed)
liquis 5 mg refill request received. Patient is 80 years old, weight- 107.5 kg, Crea-0.85 on 10/30/21, Diagnosis- afib, and last seen by Dr. Fletcher Anon on 02/06/21. Dose is appropriate based on dosing criteria. Will send in refill to requested pharmacy.

## 2021-11-16 NOTE — Telephone Encounter (Signed)
Refill request for Eliquis

## 2021-11-19 ENCOUNTER — Telehealth: Payer: Self-pay | Admitting: Pharmacy Technician

## 2021-11-19 DIAGNOSIS — Z596 Low income: Secondary | ICD-10-CM

## 2021-11-19 NOTE — Progress Notes (Signed)
[place])                                            [place] Team    11/19/2021  ABEER DESKINS 1941/01/16 [id number]                                      Medication Assistance Referral  Referral From: [facility] Embedded RPh [name] T.   Medication/Company: Charolotte Eke / [place] Patient application portion: Interoffice Mailed Provider application portion: Interoffice Mailed to Dr. Nicki Reaper Provider address/fax verified via: Office website  Medication/Company: [employer] Patient application portion: Gaffer portion: [employer] to Dr. Nicki Reaper Provider address/fax verified via: [employer]. Johndaniel Catlin, [place]  [phone]

## 2021-11-23 ENCOUNTER — Telehealth: Payer: Self-pay | Admitting: Pharmacy Technician

## 2021-11-23 DIAGNOSIS — Z596 Low income: Secondary | ICD-10-CM

## 2021-11-23 NOTE — Progress Notes (Signed)
[facility])                                            [place] Team    11/23/2021  Danielle Yates 12/28/1940 [id number]  Received both patient and provider portion(s) of patient assistance application(s) for Bevespi and Rybelsus. Faxed completed application and required documents into [place] and Novo Nordisk respectively.    Danielle Novell P. Danielle Yates, [place]  [phone]

## 2021-11-25 ENCOUNTER — Other Ambulatory Visit: Payer: Self-pay | Admitting: Cardiovascular Disease

## 2021-12-02 ENCOUNTER — Telehealth: Payer: Self-pay | Admitting: Pharmacy Technician

## 2021-12-02 DIAGNOSIS — Z596 Low income: Secondary | ICD-10-CM

## 2021-12-02 NOTE — Progress Notes (Signed)
[facility])                                            [place] Team    12/02/2021  HAYDEN MABIN 18-Nov-1941 [id number]  Care coordination call placed to [place] in regard to [facility] application.  Spoke to [place] who informs patient was APPROVED 11/24/21-12/05/2022. Medication will be delivered to patient's home.  Britnay Magnussen P. Mone Commisso, [place]  [phone]

## 2021-12-03 ENCOUNTER — Telehealth: Payer: Self-pay | Admitting: Internal Medicine

## 2021-12-03 NOTE — Telephone Encounter (Signed)
Patient tested positive today for covid. She has cold symptoms, no fever. She wants to know what she can take for the symptoms, Oxygen level at time of call is 98. No appointments available. Patient was sent to Access Nurse.

## 2021-12-03 NOTE — Telephone Encounter (Signed)
FYI for you- Called patient to discuss. Symptoms started 11/25/21. Tested positive 12/03/21. Denies sob, chest tightness, etc. Feels like she has a cold. O2 sats are 97-98% on room air. No changes in her breathing. Using mucinex and nasal spray. Will continue to monitor at home and let me know if she needs anything. Advised pcp is out of the office but sending to doc of day as fyi

## 2021-12-03 NOTE — Telephone Encounter (Signed)
Noted and agree.  Follow treatment plan from office  if not improving or any worsening within 72 hours and also return to office or open medical facility at ANYTIME if any symptoms persist, change, or worsen or you have any further concerns or questions. Call 911 immediately for emergencies.

## 2021-12-03 NOTE — Telephone Encounter (Signed)
Noted. Patient aware 

## 2021-12-15 ENCOUNTER — Other Ambulatory Visit: Payer: Self-pay | Admitting: Cardiovascular Disease

## 2021-12-15 ENCOUNTER — Other Ambulatory Visit: Payer: Self-pay | Admitting: Internal Medicine

## 2021-12-15 ENCOUNTER — Telehealth: Payer: PPO | Admitting: Physician Assistant

## 2021-12-15 DIAGNOSIS — B9689 Other specified bacterial agents as the cause of diseases classified elsewhere: Secondary | ICD-10-CM | POA: Diagnosis not present

## 2021-12-15 DIAGNOSIS — J019 Acute sinusitis, unspecified: Secondary | ICD-10-CM | POA: Diagnosis not present

## 2021-12-15 MED ORDER — DOXYCYCLINE HYCLATE 100 MG PO TABS: 100.0000 mg | ORAL_TABLET | Freq: Two times a day (BID) | ORAL | 0 refills | Status: DC

## 2021-12-15 NOTE — Progress Notes (Signed)

## 2021-12-15 NOTE — Progress Notes (Signed)
I have spent 5 minutes in review of e-visit questionnaire, review and updating patient chart, medical decision making and response to patient.   Jazzmyne Rasnick Cody Jemiah [place]burg, PA-C    

## 2021-12-25 ENCOUNTER — Telehealth: Payer: Self-pay | Admitting: Internal Medicine

## 2021-12-25 NOTE — Telephone Encounter (Signed)
Patient confirmed nothing acute going on. Had some questions for [name] about her new inhaler. Advised [name] is out of office. Pt asked that [name] give her a call some time next week.

## 2021-12-25 NOTE — Telephone Encounter (Signed)
Pt called in stating that the pharmacist had prescribe a new medication for her. Pt stating that she had called her pharmacy to ask the pharmacy question about the medication. Pharmacy advise Pt to contact her pcp to speak with her. Pt has question about medication (Glycopyrrolate-Formoterol (BEVESPI AEROSPHERE) 9-4.8 MCG/ACT AERO). Pt requesting callback

## 2021-12-28 ENCOUNTER — Telehealth: Payer: Self-pay | Admitting: Pharmacy Technician

## 2021-12-28 DIAGNOSIS — Z596 Low income: Secondary | ICD-10-CM

## 2021-12-28 NOTE — Progress Notes (Signed)
[facility])                                            [place] Team    12/28/2021  Danielle Yates 1941-11-15 [id number]  Care coordination call placed to [place] in regard to Rybelsus application.  Spoke to Mardene Celeste who informs patient is APPROVED 12/06/21-12/05/22. She informs medication will be shipped out based on last fill date in 2022 and going forward with delivery to the provider's office.  Leslieann Whisman P. [name], [place]  [phone]

## 2021-12-28 NOTE — Telephone Encounter (Signed)
Attempted to return patient's call. Left voicemail for her to return my call at her convenience.   She was approved for assistance with Rybelsus through [place] through [place] through 2023. Will send the following information in a MyChart message:  [employer] Patient Assistance Program has an auto-refill program where you do not need to call and request a refill each time. You should receive your medication shipment before you run out of your medication. However, if you have any issues or need assistance, you can call them at [phone]. They are available Monday though Friday 8:00 AM - 8:00 PM.   For prescription refills through the [place] Prescription Savings Program, call [phone]. Once you have been enrolled in the program, your prescriptions can easily be refilled by contacting the phone number above Monday though Friday 9:00 AM - 6:00 PM.

## 2021-12-31 ENCOUNTER — Ambulatory Visit (INDEPENDENT_AMBULATORY_CARE_PROVIDER_SITE_OTHER): Payer: PPO | Admitting: Pharmacist

## 2021-12-31 DIAGNOSIS — R7303 Prediabetes: Secondary | ICD-10-CM

## 2021-12-31 DIAGNOSIS — I10 Essential (primary) hypertension: Secondary | ICD-10-CM

## 2021-12-31 DIAGNOSIS — I739 Peripheral vascular disease, unspecified: Secondary | ICD-10-CM

## 2021-12-31 DIAGNOSIS — J449 Chronic obstructive pulmonary disease, unspecified: Secondary | ICD-10-CM

## 2021-12-31 DIAGNOSIS — E7800 Pure hypercholesterolemia, unspecified: Secondary | ICD-10-CM

## 2021-12-31 DIAGNOSIS — Z789 Other specified health status: Secondary | ICD-10-CM

## 2021-12-31 NOTE — Patient Instructions (Signed)
Danielle Yates,   It was great talking with you!  I'm glad the Charolotte Eke is helping your breathing.   Since we can't guarantee we can get you the Rybelsus in the long run (since you do not have a diagnosis of diabetes), let's not start that agent.   Moving forward, I recommend we start a medication called Repatha in addition to pravastatin to improve your cholesterol and reduce your risk of cardiovascular disease. We can apply for grant funding from the [employer] to bring your cost to $0.   It looks like you are due for a Tdap vaccine. It should have a $0 copay on all Medicare plans this year. You can pursue this without a prescription at your local pharmacy, or feel free to call our [place] at [facility] at [phone].  Take care!  [name] Darnelle Maffucci, PharmD  Visit Information  Following are the goals we discussed today:  Patient Goals/Self-Care Activities patient will:  - take medications as prescribed as evidenced by patient report and record review collaborate with provider on medication access solutions engage in dietary modifications by moderating portion sizes          Plan: Telephone follow up appointment with care management team member scheduled for:  3 months   [name] Darnelle Maffucci, PharmD, [place], CPP Clinical Pharmacist [place] at [facility] [phone]     Please call the care guide team at [phone] if you need to cancel or reschedule your appointment.   Patient verbalizes understanding of instructions and care plan provided today and agrees to view in [place]. Active MyChart status confirmed with patient.

## 2021-12-31 NOTE — Chronic Care Management (AMB) (Signed)
**Note Danielle-Identified via Obfuscation** Chronic Care Management CCM Pharmacy Note  12/31/2021 Name:  Danielle Yates MRN:  [id number] DOB:  Mar 19, 1941  Summary: - Tolerating regimen well. Approved for Bevespi assistance for 2023  Recommendations/Changes made from today's visit: - Discussed that we cannot guarantee long term access to GLP1 in patient without diabetes. Will not start Rybelsus at this time - Discussed addition of PCSK9i. She will discuss with cardiology and PCP - Recommend Tdap  Subjective: Danielle Yates is an 81 y.o. year old female who is a primary patient of Danielle Pheasant, MD.  The CCM team was consulted for assistance with disease management and care coordination needs.    Engaged with patient by telephone for follow up visit for pharmacy case management and/or care coordination services.   Objective:  Medications Reviewed Today     Reviewed by Danielle Yates, RPH-CPP (Pharmacist) on 12/31/21 at [place] List Status: <None>   Medication Order Taking? Sig Documenting Provider Last Dose Status Informant  albuterol (VENTOLIN HFA) 108 (90 Base) MCG/ACT inhaler [id number] No INHALE 2 PUFFS INTO THE LUNGS EVERY 6 HOURS AS NEEDED FOR WHEEZING OR SHORTNESS OF BREATH  Patient not taking: Reported on 11/12/2021   Danielle Lipps, MD Not Taking Active   diltiazem (TIAZAC) 360 MG 24 hr capsule [id number] Yes TAKE 1 CAPSULE(360 MG) BY MOUTH DAILY Wellington Hampshire, MD Taking Active   ELIQUIS 5 MG TABS tablet [id number] Yes TAKE 1 TABLET(5 MG) BY MOUTH TWICE DAILY Wellington Hampshire, MD Taking Active   enalapril (VASOTEC) 20 MG tablet [id number] Yes TAKE 1 TABLET BY MOUTH EVERY MORNING AND 2 TABLETS EVERY AFTERNOON Wellington Hampshire, MD Taking Active   FLUoxetine (PROZAC) 20 MG capsule [id number] Yes TAKE 1 CAPSULE(20 MG) BY MOUTH DAILY Danielle Pheasant, MD Taking Active   fluticasone (FLONASE) 50 MCG/ACT nasal spray [id number] Yes SHAKE LIQUID AND USE 2 SPRAYS IN EACH NOSTRIL DAILY Danielle Pheasant, MD Taking Active    Glycopyrrolate-Formoterol (BEVESPI AEROSPHERE) 9-4.8 MCG/ACT AERO [id number] Yes Inhale 2 puffs into the lungs in the morning and at bedtime. Complete supply of Spiriva, then switch to [facility] when received from patient assistance Danielle Pheasant, MD Taking Active   Multiple Vitamins-Minerals (PRESERVISION AREDS) CAPS [id number] Yes Take 1 capsule by mouth daily. [provider] Taking Active   pravastatin (PRAVACHOL) 10 MG tablet [id number] Yes TAKE 1 TABLET(10 MG) BY MOUTH DAILY Danielle Pheasant, MD Taking Active   Probiotic Product (PROBIOTIC DAILY PO) [id number] Yes Take 1 tablet by mouth. [provider] Taking Active   triamterene-hydrochlorothiazide (MAXZIDE-25) 37.5-25 MG tablet [id number] Yes TAKE 1 TABLET BY MOUTH DAILY Wellington Hampshire, MD Taking Active   VITAMIN D, CHOLECALCIFEROL, PO [id number] Yes Take by mouth daily. [provider] Taking Active             Pertinent Labs:   Lab Results  Component Value Date   HGBA1C 5.7 (H) 10/30/2021   Lab Results  Component Value Date   CHOL 166 10/30/2021   HDL 60 10/30/2021   LDLCALC 90 10/30/2021   TRIG 87 10/30/2021   CHOLHDL 2.8 10/30/2021   Lab Results  Component Value Date   CREATININE 0.85 10/30/2021   BUN 12 10/30/2021   NA 139 10/30/2021   K 4.0 10/30/2021   CL 100 10/30/2021   CO2 26 10/30/2021    SDOH:  (Social Determinants of Health) assessments and interventions performed:  SDOH Interventions    Flowsheet Row Most Recent Value  SDOH Interventions  Financial Strain Interventions Other (Comment)  [manufacturer assistance]       CCM Care Plan  Review of patient past medical history, allergies, medications, health status, including review of consultants reports, laboratory and other test data, was performed as part of comprehensive evaluation and provision of chronic care management services.   Care Plan : Medication Management  Updates made by Danielle Yates, RPH-CPP since  12/31/2021 12:00 AM     Problem: Afib, COPD      Long-Range Goal: Disease Progression Prevention   Start Date: 11/12/2021  Recent Progress: On track  Priority: High  Note:   Current Barriers:  Unable to independently afford treatment regimen  Pharmacist Clinical Goal(s):  patient will verbalize ability to afford treatment regimen achieve control of weight through collaboration with PharmD and provider.   Interventions: 1:1 collaboration with Danielle Pheasant, MD regarding development and update of comprehensive plan of care as evidenced by provider attestation and co-signature Inter-disciplinary care team collaboration (see longitudinal plan of care) Comprehensive medication review performed; medication list updated in electronic medical record  Health Maintenance   Yearly influenza vaccination: up to date Td/Tdap vaccination: due - discuss moving forward Pneumonia vaccination: up to date COVID vaccinations: up to date Shingrix vaccinations: up to date Colonoscopy: up to date Bone density scan: up to date Mammogram: due  Hyperlipidemia and PAD:   Uncontrolled; current treatment: pravastatin 10 mg daily;  Medications previously tried: ezetimibe - reports it made her feel "crazy", rosuvastatin, pravastatin higher doses - reported intolerance due to brain fog Discussed more stringent LDL goal of <70 for secondary prevention. Counseled on PCSK9i. She would qualify for [employer] assistance and could access Repatha for $0 copay. She will discuss with cardiology and PCP at upcoming visits.   Atrial Fibrillation: Controlled; current rate control: diltiazem 360 mg daily; anticoagulant treatment: Eliquis 5 mg BID Additional antihypertensive: triamterene/HCTZ 37.5/25 mg daily, enalapril 20 mg QAM, 40 mg QPM She meets income requirements, but will need to meet out of pocket spend requirements this calendar year. Will follow total drug spend.   Chronic Obstructive Pulmonary Disease:    Controlled; current treatment: Bevespi 9/4.8 mcg 2 puffs twice daily, albuterol HFA - reports she has not needed this lately Approved for Bevespi assistance through [employer] through 12/05/22 Hx [place]. Discontinued previously  0 exacerbations requiring treatment in the last 6 months  Reports improvement in breathing with use of LABA/LAMA over LAMA monotherapy, even given recent respiratory infections.  Recommended to continue current regimen at this time  Overweight/Obesity Complicated by pre-diabetes   Unable to achieve goal weight loss through lifestyle modification alone; current treatment: prescribed Rybelsus 3 mg, but has not started given several recent infections Medications/Strategies previously tried: diets  Baseline weight: 237 lbs  Current meal patterns: breakfast: protein shake, otherwise egg w/ toast, water; lunch: soup, sandwich; dinner: lean cuisine; snacks: dark chocolate; drinks: water mostly, 1 diet coke  Current exercise: walks at [employer] with a friend 2-3 times weekly, but tries to stay active around the house with chores  Discussed insurance and patient assistance foundation changes resulting in inability to guarantee long term access to GLP1 indicated for diabetes in patients without diabetes. We decided to not start Rybelsus at this time.   Depression/Anxiety: Controlled per patient report; current regimen: fluoxetine 20 mg daily Recommended to continue current regimen at this time  Patient Goals/Self-Care Activities patient will:  - take medications as prescribed as evidenced by patient report and record review collaborate with provider on medication  access solutions engage in dietary modifications by moderating portion sizes       Plan: Telephone follow up appointment with care management team member scheduled for:  3 months  [name] Darnelle Maffucci, PharmD, [place], [place] Clinical Pharmacist [employer] at [facility] [employer] [facility] [phone]

## 2022-01-04 NOTE — Addendum Note (Signed)
Addended by: Lars Masson on: 01/04/2022 07:18 AM   Modules accepted: Orders

## 2022-01-05 DIAGNOSIS — J449 Chronic obstructive pulmonary disease, unspecified: Secondary | ICD-10-CM

## 2022-01-05 DIAGNOSIS — I10 Essential (primary) hypertension: Secondary | ICD-10-CM | POA: Diagnosis not present

## 2022-01-05 DIAGNOSIS — E7800 Pure hypercholesterolemia, unspecified: Secondary | ICD-10-CM

## 2022-01-14 ENCOUNTER — Other Ambulatory Visit: Payer: Self-pay | Admitting: Cardiovascular Disease

## 2022-01-25 DIAGNOSIS — H353124 Nonexudative age-related macular degeneration, left eye, advanced atrophic with subfoveal involvement: Secondary | ICD-10-CM | POA: Diagnosis not present

## 2022-01-25 DIAGNOSIS — H353211 Exudative age-related macular degeneration, right eye, with active choroidal neovascularization: Secondary | ICD-10-CM | POA: Diagnosis not present

## 2022-02-09 ENCOUNTER — Ambulatory Visit: Payer: Self-pay | Admitting: Pharmacist

## 2022-02-09 NOTE — Patient Instructions (Signed)
Hi Nita,  ? ?Unfortunately, I am being asked to quickly transition into another role within the health system, so I am unable to keep our next appointment. Please continue to follow up with your primary care provider as scheduled.  ? ?It has been a pleasure working with you! ? ?[name] Darnelle Maffucci, PharmD ? ?

## 2022-02-09 NOTE — Chronic Care Management (AMB) (Signed)
?  Chronic Care Management  ? ?Note ? ?02/09/2022 ?Name: MALYIAH FELLOWS MRN: [id number] DOB: Apr 17, 1941 ? ? ? ?Closing pharmacy CCM case at this time. Patient has clinic contact information for future questions or concerns.  ? ?[name] Darnelle Maffucci, PharmD, [place], CPP ?Clinical Pharmacist ?Therapist, music at [facility] ?[phone] ? ?

## 2022-02-14 ENCOUNTER — Other Ambulatory Visit: Payer: Self-pay | Admitting: Internal Medicine

## 2022-02-14 ENCOUNTER — Other Ambulatory Visit: Payer: Self-pay | Admitting: Cardiovascular Disease

## 2022-02-19 ENCOUNTER — Telehealth: Payer: Self-pay | Admitting: Internal Medicine

## 2022-02-19 NOTE — Telephone Encounter (Signed)
Patient aware that labs are in  ?

## 2022-02-19 NOTE — Telephone Encounter (Signed)
Pt called in requesting for lab orders to be sent to [facility]... No lab orders placed in system... Pt is requesting for labs before her schedule appt on 03/02/2022 at 8:30am... ?

## 2022-02-22 NOTE — Progress Notes (Signed)
? ?Cardiology Office Note   ? ?Date:  02/24/2022  ? ?ID:  Danielle Yates, DOB 15-Apr-1941, MRN [id number] ? ?PCP:  Einar Pheasant, MD  ?Cardiologist:  Kathlyn Sacramento, MD  ?Electrophysiologist:  None  ? ?Chief Complaint: Follow-up ? ?History of Present Illness:  ? ?Danielle Yates is a 81 y.o. female with history of permanent A. fib on Eliquis diagnosed in 10/2017, refractory hypertension, PAD, COPD with ongoing tobacco use, hyperlipidemia, and obesity who presents for follow-up of her A. Fib. ?  ?She has previously not tolerated metoprolol due to bradycardia and fatigue. She has not tolerated amlodipine or Coreg secondary to headache and dizziness. She is also known to be intolerant to high-intensity statins, tolerating pravastatin. She has previously undergone echo in 2013 that showed normal LV systolic function with an EF of 60-65%, no RWMA, Gr1DD, trivial mitral regurgitation, and a mildly dilated left atrium measuring 43 mm. Renal artery ultrasound in 2013 showed no evidence of RAS. She has known bilateral common iliac artery disease that is being managed medically given lack of claudication symptoms. She was diagnosed with A. fib with RVR in 10/2017 and was started on Eliquis at that time given a CHADS2VASc of 5.  She was asymptomatic.  Echo in 08/2017 showed normal LV systolic function with an EF of 65 to 70%, mild LVH, aortic valve sclerosis without stenosis, mildly dilated left atrium, RV cavity size is normal with normal RV systolic function.   ? ?She was last seen in the office in 02/2021 and was doing well from a cardiac perspective.  It was noted she had brain fog after undergoing a trial of Zetia. ? ?She comes in doing well from a cardiac perspective.  She is without symptoms of angina or decompensation.  No dizziness, presyncope, or syncope.  No falls, hematochezia, or melena.  She is tolerating anticoagulation without issues.  She is interested in pursuing PCSK9 inhibitor given continued brain fog  associated with pravastatin.  No nonhealing wounds or symptoms of lifestyle limiting claudication. ? ? ?Labs independently reviewed: ?02/2022 - TC 165, TG 84, HDL 62, LDL 87, albumin 4.6, AST/ALT normal, A1c 5.8, BUN 14, serum creatinine 0.93, potassium 3.8 ?05/2021 - Hgb 14.2, PLT 239 ?01/2021 - TSH normal ? ?Past Medical History:  ?Diagnosis Date  ? Allergy   ? Aortic valve sclerosis   ? a. TTE 2013: EF 60-65%, no RWMA, Gr1DD, trivial MR, mildly dilated LA 43 mm; b. TTE 08/2017: EF 65-70%, mild LVH, aortic valve sclerosis without stenosis, mildly dilated LA 36 mm, RV size and sys fxn nl  ? Arthropathy, unspecified, site unspecified   ? Basal cell carcinoma 06/15/2018  ? upper back right of midline/superficial  ? Cataract   ? Chronic rhinitis   ? Disorder of bone and cartilage, unspecified   ? Diverticulosis of colon (without mention of hemorrhage)   ? Essential hypertension   ? a. refractory HTN; b. renal US 2013 negative for RAS  ? External hemorrhoids without mention of complication   ? Medication intolerance   ? Obesity, unspecified   ? Other abnormal glucose   ? Panic disorder without agoraphobia   ? Peripheral arterial disease ([place])   ? a.  Known bilateral common iliac disease-> medical therapy-asymptomatic.  ? Persistent atrial fibrillation ([place])   ? a. diagnosed 10/19/17; b. CHADS2VASc 5 (HTN, age x 2, vascular disease, female)-->Eliquis  ? Pure hypercholesterolemia   ? Tobacco use disorder   ? Unspecified vitamin D deficiency   ? ? ?  Past Surgical History:  ?Procedure Laterality Date  ? APPENDECTOMY  1985  ? CHOLECYSTECTOMY  1985  ? with open wound  ? EYE SURGERY  2011  ? CATARACTS -  BOTH  ? Removed spot on face    ? [name]  ? TONSILLECTOMY AND ADENOIDECTOMY  1946  ? VAGINAL HYSTERECTOMY  1982  ? fibroid and endometriosis; one ovary remaining  ? ? ?Current Medications: ?Current Meds  ?Medication Sig  ? albuterol (VENTOLIN HFA) 108 (90 Base) MCG/ACT inhaler INHALE 2 PUFFS INTO THE LUNGS EVERY 6 HOURS AS NEEDED  FOR WHEEZING OR SHORTNESS OF BREATH  ? diltiazem (TIAZAC) 360 MG 24 hr capsule TAKE 1 CAPSULE(360 MG) BY MOUTH DAILY  ? ELIQUIS 5 MG TABS tablet TAKE 1 TABLET(5 MG) BY MOUTH TWICE DAILY  ? enalapril (VASOTEC) 20 MG tablet TAKE 1 TABLET BY MOUTH EVERY MORNING AND 2 TABLETS EVERY AFTERNOON  ? FLUoxetine (PROZAC) 20 MG capsule TAKE 1 CAPSULE(20 MG) BY MOUTH DAILY  ? fluticasone (FLONASE) 50 MCG/ACT nasal spray SHAKE LIQUID AND USE 2 SPRAYS IN EACH NOSTRIL DAILY  ? Glycopyrrolate-Formoterol (BEVESPI AEROSPHERE) 9-4.8 MCG/ACT AERO Inhale 2 puffs into the lungs in the morning and at bedtime. Complete supply of Spiriva, then switch to [facility] when received from patient assistance  ? Multiple Vitamins-Minerals (PRESERVISION AREDS) CAPS Take 1 capsule by mouth daily.  ? pravastatin (PRAVACHOL) 10 MG tablet TAKE 1 TABLET(10 MG) BY MOUTH DAILY  ? Probiotic Product (PROBIOTIC DAILY PO) Take 1 tablet by mouth.  ? triamterene-hydrochlorothiazide (MAXZIDE-25) 37.5-25 MG tablet TAKE 1 TABLET BY MOUTH DAILY  ? VITAMIN D, CHOLECALCIFEROL, PO Take by mouth daily.  ? ? ?Allergies:   Amlodipine, Codeine, Coreg [carvedilol], Crestor [rosuvastatin], Metoprolol, Statins, and Nickel  ? ?Social History  ? ?Socioeconomic History  ? Marital status: Widowed  ?  Spouse name: Not on file  ? Number of children: 0  ? Years of education: college  ? Highest education level: Not on file  ?Occupational History  ? Occupation: Estate manager/land agent  ?  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 Smoking status: Every Day  ?  Packs/day: 0.50  ?  Years: 50.00  ?  Pack years: 25.00  ?  Types: Cigarettes  ? Smokeless tobacco: Never  ? Tobacco comments:  ?  10 a day  ?Vaping Use  ? Vaping Use: Never used  ?Substance and Sexual Activity  ? Alcohol use: No  ?  Alcohol/week: 0.0 standard drinks  ? Drug use: No  ? Sexual activity: Never  ?Other Topics Concern  ? Not on file  ?Social History Narrative  ? Lives alone. Widowed after 23 years. Not dating. Has dog.  Exercise: Walking; light has not been walking as she used to before her 2 dogs passed away. Tries to walk some. Caffeine: Carbonated beverages, 3 servings/day;Diet coke. 3 a day.  ?   ? 10/02/12  PER PATIENT'S LAVENDER FORM: WALK 5 DAYS/WEEK FOR 30 MINUTES  ? ?Social Determinants of Health  ? ?Financial Resource Strain: Medium Risk  ? Difficulty of Paying Living Expenses: Somewhat hard  ?Food Insecurity: Not on file  ?Transportation Needs: Not on file  ?Physical Activity: Not on file  ?Stress: Not on file  ?Social Connections: Not on file  ?  ? ?Family History:  ?The patient's family history includes Aneurysm in her mother; Cancer in her brother; Heart disease in her brother and father; Hyperlipidemia in her brother; Hypertension in her brother and sister; Rheumatic fever in her  mother. ? ?ROS:   ?12-point review of system is negative unless otherwise noted in the HPI. ? ? ?EKGs/Labs/Other Studies Reviewed:   ? ?Studies reviewed were summarized above. The additional studies were reviewed today: ? ?2D echo 08/2017: ?- Left ventricle: The cavity size was normal. Wall thickness was ?  increased in a pattern of mild LVH. Systolic function was ?  vigorous. The estimated ejection fraction was in the range of 65% ?  to 70%. ?- Aortic valve: Trileaflet; mildly thickened leaflets. Sclerosis ?  without stenosis. ?- Mitral valve: Mildly thickened leaflets . ?- Left atrium: The atrium was mildly dilated. ?- Right ventricle: The cavity size was normal. Systolic function ?  was normal. ? ? ?EKG:  EKG is ordered today.  The EKG ordered today demonstrates A-fib with RVR, 112 bpm, nonspecific ST-T changes largely unchanged when compared to prior tracing ? ?Recent Labs: ?05/25/2021: Hemoglobin 14.2; Platelets 239 ?02/23/2022: ALT 10; BUN 14; Creatinine, Ser 0.93; Potassium 3.8; Sodium 141  ?Recent Lipid Panel ?   ?Component Value Date/Time  ? CHOL 165 02/23/2022 0835  ? TRIG 84 02/23/2022 0835  ? HDL 62 02/23/2022 0835  ? CHOLHDL 2.7  02/23/2022 0835  ? [place] 87 02/23/2022 0835  ? ? ?PHYSICAL EXAM:   ? ?VS:  BP 120/74 (BP Location: Left Arm, Patient Position: Sitting, Cuff Size: Large)   Pulse (!) 112   Ht '5\' 4"'$  (1.626 m)   Wt 229 lb 6 o

## 2022-02-23 DIAGNOSIS — E7800 Pure hypercholesterolemia, unspecified: Secondary | ICD-10-CM | POA: Diagnosis not present

## 2022-02-23 DIAGNOSIS — I10 Essential (primary) hypertension: Secondary | ICD-10-CM | POA: Diagnosis not present

## 2022-02-23 DIAGNOSIS — R739 Hyperglycemia, unspecified: Secondary | ICD-10-CM | POA: Diagnosis not present

## 2022-02-24 ENCOUNTER — Other Ambulatory Visit: Payer: Self-pay

## 2022-02-24 ENCOUNTER — Ambulatory Visit: Payer: PPO | Admitting: Physician Assistant

## 2022-02-24 ENCOUNTER — Encounter: Payer: Self-pay | Admitting: Physician Assistant

## 2022-02-24 VITALS — BP 120/74 | HR 112 | Ht 64.0 in | Wt 229.4 lb

## 2022-02-24 DIAGNOSIS — I4821 Permanent atrial fibrillation: Secondary | ICD-10-CM

## 2022-02-24 DIAGNOSIS — I739 Peripheral vascular disease, unspecified: Secondary | ICD-10-CM

## 2022-02-24 DIAGNOSIS — I358 Other nonrheumatic aortic valve disorders: Secondary | ICD-10-CM

## 2022-02-24 DIAGNOSIS — I10 Essential (primary) hypertension: Secondary | ICD-10-CM | POA: Diagnosis not present

## 2022-02-24 DIAGNOSIS — E785 Hyperlipidemia, unspecified: Secondary | ICD-10-CM | POA: Diagnosis not present

## 2022-02-24 LAB — BASIC METABOLIC PANEL
BUN/Creatinine Ratio: 15 (ref 12–28)
BUN: 14 mg/dL (ref 8–27)
CO2: 24 mmol/L (ref 20–29)
Calcium: 10 mg/dL (ref 8.7–10.3)
Chloride: 99 mmol/L (ref 96–106)
Creatinine, Ser: 0.93 mg/dL (ref 0.57–1.00)
Glucose: 106 mg/dL — ABNORMAL HIGH (ref 70–99)
Potassium: 3.8 mmol/L (ref 3.5–5.2)
Sodium: 141 mmol/L (ref 134–144)
eGFR: 62 mL/min/{1.73_m2} (ref >59)

## 2022-02-24 LAB — LIPID PANEL
Chol/HDL Ratio: 2.7 ratio (ref 0.0–4.4)
Cholesterol, Total: 165 mg/dL (ref 100–199)
HDL: 62 mg/dL (ref >39)
LDL Chol Calc (NIH): 87 mg/dL (ref 0–99)
Triglycerides: 84 mg/dL (ref 0–149)
VLDL Cholesterol Cal: 16 mg/dL (ref 5–40)

## 2022-02-24 LAB — HEPATIC FUNCTION PANEL
ALT: 10 IU/L (ref 0–32)
AST: 20 IU/L (ref 0–40)
Albumin: 4.6 g/dL (ref 3.7–4.7)
Alkaline Phosphatase: 60 IU/L (ref 44–121)
Bilirubin Total: 1.2 mg/dL (ref 0.0–1.2)
Bilirubin, Direct: 0.29 mg/dL (ref 0.00–0.40)
Total Protein: 7.3 g/dL (ref 6.0–8.5)

## 2022-02-24 LAB — HEMOGLOBIN A1C
Est. average glucose Bld gHb Est-mCnc: 120 mg/dL
Hgb A1c MFr Bld: 5.8 % — ABNORMAL HIGH (ref 4.8–5.6)

## 2022-02-24 NOTE — Patient Instructions (Signed)
Medication Instructions:  ?- Your physician recommends that you continue on your current medications as directed. Please refer to the Current Medication list given to you today. ? ?*If you need a refill on your cardiac medications before your next appointment, please call your pharmacy* ? ? ?Lab Work: ?- none ordered ? ?If you have labs (blood work) drawn today and your tests are completely normal, you will receive your results only by: ?MyChart Message (if you have MyChart) OR ?A paper copy in the mail ?If you have any lab test that is abnormal or we need to change your treatment, we will call you to review the results. ? ? ?Testing/Procedures: ?-  You have been referred to: Lipid Clinic ?The Lipid Clinic scheduler will contact you directly to arrange for an appointment ? ? ?Follow-Up: ?At [facility], you and your health needs are our priority.  As part of our continuing mission to provide you with exceptional heart care, we have created designated Provider Care Teams.  These Care Teams include your primary Cardiologist (physician) and Advanced Practice Providers (APPs -  Physician Assistants and Nurse Practitioners) who all work together to provide you with the care you need, when you need it. ? ?We recommend signing up for the patient portal called "MyChart".  Sign up information is provided on this After Visit Summary.  MyChart is used to connect with patients for Virtual Visits (Telemedicine).  Patients are able to view lab/test results, encounter notes, upcoming appointments, etc.  Non-urgent messages can be sent to your provider as well.   ?To learn more about what you can do with MyChart, go to [email or web address].   ? ?Your next appointment:   ?1 year(s) ? ?The format for your next appointment:   ?In Person ? ?Provider:   ?You may see Kathlyn Sacramento, MD or one of the following Advanced Practice Providers on your designated Care Team:   ? ?Christell Faith, PA-C ?  ? ? ?Other Instructions ?N/a ? ?

## 2022-03-02 ENCOUNTER — Encounter: Payer: Self-pay | Admitting: Internal Medicine

## 2022-03-02 ENCOUNTER — Ambulatory Visit (INDEPENDENT_AMBULATORY_CARE_PROVIDER_SITE_OTHER): Payer: PPO | Admitting: Internal Medicine

## 2022-03-02 ENCOUNTER — Other Ambulatory Visit: Payer: Self-pay

## 2022-03-02 VITALS — BP 136/84 | HR 90 | Temp 98.2°F | Resp 18 | Ht 63.0 in | Wt 230.1 lb

## 2022-03-02 DIAGNOSIS — I739 Peripheral vascular disease, unspecified: Secondary | ICD-10-CM

## 2022-03-02 DIAGNOSIS — E876 Hypokalemia: Secondary | ICD-10-CM | POA: Diagnosis not present

## 2022-03-02 DIAGNOSIS — Z720 Tobacco use: Secondary | ICD-10-CM | POA: Diagnosis not present

## 2022-03-02 DIAGNOSIS — J449 Chronic obstructive pulmonary disease, unspecified: Secondary | ICD-10-CM | POA: Diagnosis not present

## 2022-03-02 DIAGNOSIS — I700 Atherosclerosis of aorta: Secondary | ICD-10-CM | POA: Diagnosis not present

## 2022-03-02 DIAGNOSIS — R7303 Prediabetes: Secondary | ICD-10-CM

## 2022-03-02 DIAGNOSIS — I4821 Permanent atrial fibrillation: Secondary | ICD-10-CM

## 2022-03-02 DIAGNOSIS — I10 Essential (primary) hypertension: Secondary | ICD-10-CM

## 2022-03-02 DIAGNOSIS — R739 Hyperglycemia, unspecified: Secondary | ICD-10-CM

## 2022-03-02 DIAGNOSIS — D6869 Other thrombophilia: Secondary | ICD-10-CM | POA: Diagnosis not present

## 2022-03-02 DIAGNOSIS — Z0000 Encounter for general adult medical examination without abnormal findings: Secondary | ICD-10-CM | POA: Diagnosis not present

## 2022-03-02 DIAGNOSIS — L989 Disorder of the skin and subcutaneous tissue, unspecified: Secondary | ICD-10-CM

## 2022-03-02 DIAGNOSIS — E7800 Pure hypercholesterolemia, unspecified: Secondary | ICD-10-CM | POA: Diagnosis not present

## 2022-03-02 MED ORDER — MUPIROCIN 2 % EX OINT: 1.0000 "application " | TOPICAL_OINTMENT | Freq: Two times a day (BID) | CUTANEOUS | 0 refills | Status: DC

## 2022-03-02 NOTE — Progress Notes (Signed)
Patient ID: Danielle Yates, female   DOB: [birth year], 81 y.o.   MRN: [id number] ? ? ?Subjective:  ? ? Patient ID: Danielle Yates, female    DOB: 1941/01/03, 81 y.o.   MRN: [id number] ? ?This visit occurred during the SARS-CoV-2 public health emergency.  Safety protocols were in place, including screening questions prior to the visit, additional usage of staff PPE, and extensive cleaning of exam room while observing appropriate contact time as indicated for disinfecting solutions.  ? ?Patient here for her physical exam.  ? ?Chief Complaint  ?Patient presents with  ? Annual Exam  ? .  ? ?HPI ?She is doing well.  Breathing well.  Breztri working well for her.  No chest pain.  No increased cough or congestion.  Feels from a cardiac standpoint - stable.  No acid reflux or abdominal pain reported.  Bowels moving. Saw cardiology 02/24/22.  Reported persistent brain fog with pravastatin.  Being referred to lipid clinic for repatha.  ? ? ?Past Medical History:  ?Diagnosis Date  ? Allergy   ? Aortic valve sclerosis   ? a. TTE 2013: EF 60-65%, no RWMA, Gr1DD, trivial MR, mildly dilated LA 43 mm; b. TTE 08/2017: EF 65-70%, mild LVH, aortic valve sclerosis without stenosis, mildly dilated LA 36 mm, RV size and sys fxn nl  ? Arthropathy, unspecified, site unspecified   ? Basal cell carcinoma 06/15/2018  ? upper back right of midline/superficial  ? Cataract   ? Chronic rhinitis   ? Disorder of bone and cartilage, unspecified   ? Diverticulosis of colon (without mention of hemorrhage)   ? Essential hypertension   ? a. refractory HTN; b. renal US 2013 negative for RAS  ? External hemorrhoids without mention of complication   ? Medication intolerance   ? Obesity, unspecified   ? Other abnormal glucose   ? Panic disorder without agoraphobia   ? Peripheral arterial disease ([place])   ? a.  Known bilateral common iliac disease-> medical therapy-asymptomatic.  ? Persistent atrial fibrillation ([place])   ? a. diagnosed 10/19/17; b. CHADS2VASc 5  (HTN, age x 2, vascular disease, female)-->Eliquis  ? Pure hypercholesterolemia   ? Tobacco use disorder   ? Unspecified vitamin D deficiency   ? ?Past Surgical History:  ?Procedure Laterality Date  ? APPENDECTOMY  1985  ? CHOLECYSTECTOMY  1985  ? with open wound  ? EYE SURGERY  2011  ? CATARACTS -  BOTH  ? Removed spot on face    ? [name]  ? TONSILLECTOMY AND ADENOIDECTOMY  1946  ? VAGINAL HYSTERECTOMY  1982  ? fibroid and endometriosis; one ovary remaining  ? ?Family History  ?Problem Relation Age of Onset  ? Hypertension Sister   ? Heart disease Brother   ? Hypertension Brother   ? Hyperlipidemia Brother   ? Cancer Brother   ? Aneurysm Mother   ? Rheumatic fever Mother   ?     as an adult  ? Heart disease Father   ?     MI x 2  ? ?Social History  ? ?Socioeconomic History  ? Marital status: Widowed  ?  Spouse name: Not on file  ? Number of children: 0  ? Years of education: college  ? Highest education level: Not on file  ?Occupational History  ? Occupation: Estate manager/land agent  ?  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 Smoking status: Every Day  ?  Packs/day: 0.50  ?  Years: 50.00  ?  Pack years: 25.00  ?  Types: Cigarettes  ? Smokeless tobacco: Never  ? Tobacco comments:  ?  10 a day  ?Vaping Use  ? Vaping Use: Never used  ?Substance and Sexual Activity  ? Alcohol use: No  ?  Alcohol/week: 0.0 standard drinks  ? Drug use: No  ? Sexual activity: Never  ?Other Topics Concern  ? Not on file  ?Social History Narrative  ? Lives alone. Widowed after 23 years. Not dating. Has dog. Exercise: Walking; light has not been walking as she used to before her 2 dogs passed away. Tries to walk some. Caffeine: Carbonated beverages, 3 servings/day;Diet coke. 3 a day.  ?   ? 10/02/12  PER PATIENT'S LAVENDER FORM: WALK 5 DAYS/WEEK FOR 30 MINUTES  ? ?Social Determinants of Health  ? ?Financial Resource Strain: Medium Risk  ? Difficulty of Paying Living Expenses: Somewhat hard  ?Food Insecurity: Not on file  ?Transportation  Needs: Not on file  ?Physical Activity: Not on file  ?Stress: Not on file  ?Social Connections: Not on file  ? ? ? ?Review of Systems  ?Constitutional:  Negative for appetite change and unexpected weight change.  ?HENT:  Negative for congestion and sinus pressure.   ?Respiratory:  Negative for cough, chest tightness and shortness of breath.   ?Cardiovascular:  Negative for chest pain, palpitations and leg swelling.  ?Gastrointestinal:  Negative for abdominal pain, diarrhea, nausea and vomiting.  ?Genitourinary:  Negative for difficulty urinating and dysuria.  ?Musculoskeletal:  Negative for joint swelling and myalgias.  ?Skin:  Negative for color change and rash.  ?Neurological:  Negative for dizziness, light-headedness and headaches.  ?Psychiatric/Behavioral:  Negative for agitation and dysphoric mood.   ? ?   ?Objective:  ?  ? ?BP 136/84   Pulse 90 Comment: 112-90  Temp 98.2 ?F (36.8 ?C) (Oral)   Resp 18   Ht '5\' 3"'$  (1.6 m)   Wt 230 lb 2 oz (104.4 kg)   SpO2 98%   BMI 40.76 kg/m?  ?Wt Readings from Last 3 Encounters:  ?03/02/22 230 lb 2 oz (104.4 kg)  ?02/24/22 229 lb 6 oz (104 kg)  ?11/03/21 237 lb (107.5 kg)  ? ? ?Physical Exam ?Vitals reviewed.  ?Constitutional:   ?   General: She is not in acute distress. ?   Appearance: Normal appearance. She is well-developed.  ?HENT:  ?   Head: Normocephalic and atraumatic.  ?   Right Ear: External ear normal.  ?   Left Ear: External ear normal.  ?Eyes:  ?   General: No scleral icterus.    ?   Right eye: No discharge.     ?   Left eye: No discharge.  ?   Conjunctiva/sclera: Conjunctivae normal.  ?Neck:  ?   Thyroid: No thyromegaly.  ?Cardiovascular:  ?   Rate and Rhythm: Normal rate and regular rhythm.  ?Pulmonary:  ?   Effort: No tachypnea, accessory muscle usage or respiratory distress.  ?   Breath sounds: Normal breath sounds. No decreased breath sounds or wheezing.  ?Chest:  ?Breasts: ?   Right: No inverted nipple, mass, nipple discharge or tenderness (no axillary  adenopathy).  ?   Left: No inverted nipple, mass, nipple discharge or tenderness (no axilarry adenopathy).  ?Abdominal:  ?   General: Bowel sounds are normal.  ?   Palpations: Abdomen is soft.  ?   Tenderness: There is no abdominal tenderness.  ?Musculoskeletal:     ?   General: No  swelling or tenderness.  ?   Cervical back: Neck supple. No tenderness.  ?Lymphadenopathy:  ?   Cervical: No cervical adenopathy.  ?Skin: ?   Findings: No erythema or rash.  ?Neurological:  ?   Mental Status: She is alert and oriented to person, place, and time.  ?Psychiatric:     ?   Mood and Affect: Mood normal.     ?   Behavior: Behavior normal.  ? ? ? ?Outpatient Encounter Medications as of 03/02/2022  ?Medication Sig  ? albuterol (VENTOLIN HFA) 108 (90 Base) MCG/ACT inhaler INHALE 2 PUFFS INTO THE LUNGS EVERY 6 HOURS AS NEEDED FOR WHEEZING OR SHORTNESS OF BREATH  ? diltiazem (TIAZAC) 360 MG 24 hr capsule TAKE 1 CAPSULE(360 MG) BY MOUTH DAILY  ? ELIQUIS 5 MG TABS tablet TAKE 1 TABLET(5 MG) BY MOUTH TWICE DAILY  ? enalapril (VASOTEC) 20 MG tablet TAKE 1 TABLET BY MOUTH EVERY MORNING AND 2 TABLETS EVERY AFTERNOON  ? FLUoxetine (PROZAC) 20 MG capsule TAKE 1 CAPSULE(20 MG) BY MOUTH DAILY  ? fluticasone (FLONASE) 50 MCG/ACT nasal spray SHAKE LIQUID AND USE 2 SPRAYS IN EACH NOSTRIL DAILY  ? Glycopyrrolate-Formoterol (BEVESPI AEROSPHERE) 9-4.8 MCG/ACT AERO Inhale 2 puffs into the lungs in the morning and at bedtime. Complete supply of Spiriva, then switch to [facility] when received from patient assistance  ? Multiple Vitamins-Minerals (PRESERVISION AREDS) CAPS Take 1 capsule by mouth daily.  ? mupirocin ointment (BACTROBAN) 2 % Apply 1 application. topically 2 (two) times daily.  ? pravastatin (PRAVACHOL) 10 MG tablet TAKE 1 TABLET(10 MG) BY MOUTH DAILY  ? Probiotic Product (PROBIOTIC DAILY PO) Take 1 tablet by mouth.  ? triamterene-hydrochlorothiazide (MAXZIDE-25) 37.5-25 MG tablet TAKE 1 TABLET BY MOUTH DAILY  ? VITAMIN D, CHOLECALCIFEROL, PO  Take by mouth daily.  ? ?No facility-administered encounter medications on file as of 03/02/2022.  ?  ? ?Lab Results  ?Component Value Date  ? WBC 9.6 05/25/2021  ? HGB 14.2 05/25/2021  ? HCT 41.9 05/25/2021

## 2022-03-02 NOTE — Assessment & Plan Note (Addendum)
Physical today 03/02/22.  Mammogram 09/08/21 - [facility] I.  Colonoscopy 2012.  Overdue.  Notify when agreeable.  ?

## 2022-03-08 ENCOUNTER — Encounter: Payer: Self-pay | Admitting: Internal Medicine

## 2022-03-08 DIAGNOSIS — D6869 Other thrombophilia: Secondary | ICD-10-CM | POA: Insufficient documentation

## 2022-03-08 DIAGNOSIS — L989 Disorder of the skin and subcutaneous tissue, unspecified: Secondary | ICD-10-CM | POA: Insufficient documentation

## 2022-03-08 NOTE — Assessment & Plan Note (Signed)
Have discussed recommendation to quit.  Follow.  ?

## 2022-03-08 NOTE — Assessment & Plan Note (Signed)
Low carb diet and exercise.  Follow met b and a1c.  ?

## 2022-03-08 NOTE — Assessment & Plan Note (Signed)
On eliquis.  Afib.  

## 2022-03-08 NOTE — Assessment & Plan Note (Signed)
Affecting common iliac arteries mainly.  Continue risk factor modification.  Continue pravastatin.  

## 2022-03-08 NOTE — Assessment & Plan Note (Signed)
Has been followed by pulmonary.  Breathing stable. Continue spiriva.    ?

## 2022-03-08 NOTE — Assessment & Plan Note (Signed)
Continue pravastatin 

## 2022-03-08 NOTE — Assessment & Plan Note (Signed)
On pravastatin.  Did not tolerate additions of zetia.  Had intolerance to other statin medication.  Feels having brain fog with pravastatin.  Discussed with cardiology.  They are referring her to lipid clinic.  Discussed repatha.  Low cholesterol diet and exercise.  Follow lipid panel and liver function tests.   ?

## 2022-03-08 NOTE — Assessment & Plan Note (Signed)
Continue diltiazem and enalapril.  Follow pressures.  

## 2022-03-08 NOTE — Assessment & Plan Note (Signed)
Abdomen.  She will schedule dermatology appt.  ?

## 2022-03-08 NOTE — Assessment & Plan Note (Signed)
No increased heart rate or palpitations.  Continue eliquis.  On diltiazem.  Follow.   

## 2022-03-08 NOTE — Assessment & Plan Note (Addendum)
Potassium - recent check wnl.   ?

## 2022-03-16 ENCOUNTER — Other Ambulatory Visit: Payer: Self-pay | Admitting: Internal Medicine

## 2022-03-18 ENCOUNTER — Encounter: Payer: Self-pay | Admitting: Internal Medicine

## 2022-03-24 ENCOUNTER — Telehealth: Payer: PPO

## 2022-04-21 ENCOUNTER — Other Ambulatory Visit: Payer: Self-pay | Admitting: Cardiovascular Disease

## 2022-05-10 DIAGNOSIS — H353211 Exudative age-related macular degeneration, right eye, with active choroidal neovascularization: Secondary | ICD-10-CM | POA: Diagnosis not present

## 2022-05-18 ENCOUNTER — Other Ambulatory Visit: Payer: Self-pay | Admitting: Cardiovascular Disease

## 2022-05-18 NOTE — Telephone Encounter (Signed)
Refill Request.  

## 2022-05-18 NOTE — Telephone Encounter (Signed)
Prescription refill request for Eliquis received. Indication: PAF Last office visit: 02/24/22  R Dunn PA-C Scr: 0.93 on 02/23/22 Age:  81 Weight: 104kg  Based on above findings Eliquis '5mg'$  twice daily is the appropriate dose.  Refill approved.

## 2022-05-20 ENCOUNTER — Other Ambulatory Visit: Payer: Self-pay | Admitting: Cardiovascular Disease

## 2022-05-22 ENCOUNTER — Other Ambulatory Visit: Payer: Self-pay | Admitting: Cardiovascular Disease

## 2022-05-23 ENCOUNTER — Other Ambulatory Visit: Payer: Self-pay | Admitting: Cardiovascular Disease

## 2022-05-24 ENCOUNTER — Telehealth: Payer: Self-pay | Admitting: Cardiovascular Disease

## 2022-05-24 NOTE — Telephone Encounter (Signed)
diltiazem (TIAZAC) 360 MG 24 hr capsule 90 caps ule 2 05/24/2022    Sig: TAKE 1 CAPSULE(360 MG) BY MOUTH DAILY   Sent to pharmacy as: diltiazem (TIAZAC) 360 MG 24 hr capsule   E-Prescribing Status: Receipt confirmed by pharmacy (05/24/2022 10:50 AM EDT)

## 2022-05-24 NOTE — Telephone Encounter (Signed)
*  STAT* If patient is at the pharmacy, call can be transferred to refill team.   1. Which medications need to be refilled? (please list name of each medication and dose if known) diltiazem (TIAZAC) 360 MG 24 hr capsule  2. Which pharmacy/location (including street and city if local pharmacy) is medication to be sent to? [facility] [place], [place]  3. Do they need a 30 day or 90 day supply? 90  Pt only has enough to last until 06/21

## 2022-06-05 ENCOUNTER — Other Ambulatory Visit: Payer: Self-pay | Admitting: Internal Medicine

## 2022-06-25 ENCOUNTER — Telehealth: Payer: Self-pay | Admitting: Internal Medicine

## 2022-06-25 ENCOUNTER — Telehealth: Payer: Self-pay

## 2022-06-25 DIAGNOSIS — I10 Essential (primary) hypertension: Secondary | ICD-10-CM

## 2022-06-25 DIAGNOSIS — R739 Hyperglycemia, unspecified: Secondary | ICD-10-CM

## 2022-06-25 DIAGNOSIS — E7800 Pure hypercholesterolemia, unspecified: Secondary | ICD-10-CM

## 2022-06-25 NOTE — Telephone Encounter (Signed)
Patient is requesting labs to be sent to [facility] so they can be completed before her visit next week.

## 2022-06-25 NOTE — Telephone Encounter (Signed)
Order placed for tsh to go along with cbc, met b, a1c, lipid and liver panel - [facility] labs.

## 2022-06-25 NOTE — Telephone Encounter (Signed)
Labs to [facility]

## 2022-06-25 NOTE — Telephone Encounter (Signed)
Orders placed. Pt aware.

## 2022-07-02 ENCOUNTER — Ambulatory Visit: Payer: PPO | Admitting: Internal Medicine

## 2022-07-15 ENCOUNTER — Other Ambulatory Visit: Payer: Self-pay | Admitting: Internal Medicine

## 2022-07-15 ENCOUNTER — Other Ambulatory Visit: Payer: Self-pay | Admitting: Cardiovascular Disease

## 2022-07-26 DIAGNOSIS — I10 Essential (primary) hypertension: Secondary | ICD-10-CM | POA: Diagnosis not present

## 2022-07-26 DIAGNOSIS — R739 Hyperglycemia, unspecified: Secondary | ICD-10-CM | POA: Diagnosis not present

## 2022-07-26 DIAGNOSIS — E7800 Pure hypercholesterolemia, unspecified: Secondary | ICD-10-CM | POA: Diagnosis not present

## 2022-07-27 LAB — CBC WITH DIFFERENTIAL/PLATELET
Basophils Absolute: 0.1 10*3/uL (ref 0.0–0.2)
Basos: 1 %
EOS (ABSOLUTE): 0.1 10*3/uL (ref 0.0–0.4)
Eos: 1 %
Hematocrit: 45.2 % (ref 34.0–46.6)
Hemoglobin: 14.8 g/dL (ref 11.1–15.9)
Immature Grans (Abs): 0 10*3/uL (ref 0.0–0.1)
Immature Granulocytes: 0 %
Lymphocytes Absolute: 1.5 10*3/uL (ref 0.7–3.1)
Lymphs: 24 %
MCH: 29.5 pg (ref 26.6–33.0)
MCHC: 32.7 g/dL (ref 31.5–35.7)
MCV: 90 fL (ref 79–97)
Monocytes Absolute: 0.5 10*3/uL (ref 0.1–0.9)
Monocytes: 7 %
Neutrophils Absolute: 4.3 10*3/uL (ref 1.4–7.0)
Neutrophils: 67 %
Platelets: 232 10*3/uL (ref 150–450)
RBC: 5.01 x10E6/uL (ref 3.77–5.28)
RDW: 12.2 % (ref 11.7–15.4)
WBC: 6.5 10*3/uL (ref 3.4–10.8)

## 2022-07-27 LAB — LIPID PANEL
Chol/HDL Ratio: 2.8 ratio (ref 0.0–4.4)
Cholesterol, Total: 158 mg/dL (ref 100–199)
HDL: 57 mg/dL (ref >39)
LDL Chol Calc (NIH): 86 mg/dL (ref 0–99)
Triglycerides: 76 mg/dL (ref 0–149)
VLDL Cholesterol Cal: 15 mg/dL (ref 5–40)

## 2022-07-27 LAB — BASIC METABOLIC PANEL
BUN/Creatinine Ratio: 15 (ref 12–28)
BUN: 14 mg/dL (ref 8–27)
CO2: 24 mmol/L (ref 20–29)
Calcium: 9.6 mg/dL (ref 8.7–10.3)
Chloride: 97 mmol/L (ref 96–106)
Creatinine, Ser: 0.91 mg/dL (ref 0.57–1.00)
Glucose: 106 mg/dL — ABNORMAL HIGH (ref 70–99)
Potassium: 3.8 mmol/L (ref 3.5–5.2)
Sodium: 139 mmol/L (ref 134–144)
eGFR: 64 mL/min/{1.73_m2} (ref >59)

## 2022-07-27 LAB — HEPATIC FUNCTION PANEL
ALT: 13 IU/L (ref 0–32)
AST: 14 IU/L (ref 0–40)
Albumin: 4.3 g/dL (ref 3.8–4.8)
Alkaline Phosphatase: 62 IU/L (ref 44–121)
Bilirubin Total: 1.1 mg/dL (ref 0.0–1.2)
Bilirubin, Direct: 0.29 mg/dL (ref 0.00–0.40)
Total Protein: 6.9 g/dL (ref 6.0–8.5)

## 2022-07-27 LAB — HEMOGLOBIN A1C
Est. average glucose Bld gHb Est-mCnc: 120 mg/dL
Hgb A1c MFr Bld: 5.8 % — ABNORMAL HIGH (ref 4.8–5.6)

## 2022-07-27 LAB — TSH: TSH: 3.22 u[IU]/mL (ref 0.450–4.500)

## 2022-07-29 ENCOUNTER — Encounter: Payer: Self-pay | Admitting: Internal Medicine

## 2022-07-29 ENCOUNTER — Ambulatory Visit (INDEPENDENT_AMBULATORY_CARE_PROVIDER_SITE_OTHER): Payer: PPO | Admitting: Internal Medicine

## 2022-07-29 VITALS — BP 134/80 | HR 76 | Temp 97.9°F | Resp 15 | Ht 63.0 in | Wt 229.3 lb

## 2022-07-29 DIAGNOSIS — E7800 Pure hypercholesterolemia, unspecified: Secondary | ICD-10-CM

## 2022-07-29 DIAGNOSIS — M25551 Pain in right hip: Secondary | ICD-10-CM

## 2022-07-29 DIAGNOSIS — Z720 Tobacco use: Secondary | ICD-10-CM | POA: Diagnosis not present

## 2022-07-29 DIAGNOSIS — D6869 Other thrombophilia: Secondary | ICD-10-CM | POA: Diagnosis not present

## 2022-07-29 DIAGNOSIS — I10 Essential (primary) hypertension: Secondary | ICD-10-CM

## 2022-07-29 DIAGNOSIS — I4821 Permanent atrial fibrillation: Secondary | ICD-10-CM | POA: Diagnosis not present

## 2022-07-29 DIAGNOSIS — J449 Chronic obstructive pulmonary disease, unspecified: Secondary | ICD-10-CM

## 2022-07-29 DIAGNOSIS — I700 Atherosclerosis of aorta: Secondary | ICD-10-CM | POA: Diagnosis not present

## 2022-07-29 DIAGNOSIS — F439 Reaction to severe stress, unspecified: Secondary | ICD-10-CM | POA: Diagnosis not present

## 2022-07-29 DIAGNOSIS — Z1231 Encounter for screening mammogram for malignant neoplasm of breast: Secondary | ICD-10-CM

## 2022-07-29 DIAGNOSIS — I739 Peripheral vascular disease, unspecified: Secondary | ICD-10-CM | POA: Diagnosis not present

## 2022-07-29 DIAGNOSIS — R739 Hyperglycemia, unspecified: Secondary | ICD-10-CM | POA: Diagnosis not present

## 2022-07-29 NOTE — Progress Notes (Signed)
Patient ID: SELBY SLOVACEK, female   DOB: [birth year], 81 y.o.   MRN: [id number]   Subjective:    Patient ID: NOLIA TSCHANTZ, female    DOB: 13-Dec-1940, 81 y.o.   MRN: [id number]   Patient here for a scheduled follow up.   Chief Complaint  Patient presents with   Hypertension   Hyperlipidemia   .   HPI Increased stress. Dog has been sick.  Overall she feels she is handling things relatively well.  Some right hip pain/buttock pain.  Just started.  Noticed after sleeping on the couch- with her dog being sick.  No radicular symptoms.  Breathing stable.  No increased cough or congestion.  No abdominal pain.  Bowels stable.    Past Medical History:  Diagnosis Date   Allergy    Aortic valve sclerosis    a. TTE 2013: EF 60-65%, no RWMA, Gr1DD, trivial MR, mildly dilated LA 43 mm; b. TTE 08/2017: EF 65-70%, mild LVH, aortic valve sclerosis without stenosis, mildly dilated LA 36 mm, RV size and sys fxn nl   Arthropathy, unspecified, site unspecified    Basal cell carcinoma 06/15/2018   upper back right of midline/superficial   Cataract    Chronic rhinitis    Disorder of bone and cartilage, unspecified    Diverticulosis of colon (without mention of hemorrhage)    Essential hypertension    a. refractory HTN; b. renal US 2013 negative for RAS   External hemorrhoids without mention of complication    Medication intolerance    Obesity, unspecified    Other abnormal glucose    Panic disorder without agoraphobia    Peripheral arterial disease ([place])    a.  Known bilateral common iliac disease-> medical therapy-asymptomatic.   Persistent atrial fibrillation ([place])    a. diagnosed 10/19/17; b. CHADS2VASc 5 (HTN, age x 2, vascular disease, female)-->Eliquis   Pure hypercholesterolemia    Tobacco use disorder    Unspecified vitamin D deficiency    Past Surgical History:  Procedure Laterality Date   [place]   with open wound   EYE SURGERY  2011    CATARACTS -  BOTH   Removed spot on face     [place]   fibroid and endometriosis; one ovary remaining   Family History  Problem Relation Age of Onset   Hypertension Sister    Heart disease Brother    Hypertension Brother    Hyperlipidemia Brother    Cancer Brother    Aneurysm Mother    Rheumatic fever Mother        as an adult   Heart disease Father        MI x 2   Social History   Socioeconomic History   Marital status: Widowed    Spouse name: Not on file   Number of children: 0   Years of education: college   Highest education level: Not on file  Occupational History   Occupation: Estate manager/land agent    Comment: [employer] 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 Widowed after 23 years. Not dating. Has dog. Exercise: Walking; light has not been walking as she used to before her 2 dogs passed away. Tries to walk some. Caffeine: Carbonated beverages, 3 servings/day;Diet coke. 3 a day.      10/02/12  PER PATIENT'S LAVENDER FORM: WALK 5 DAYS/WEEK FOR 30 MINUTES   Social Determinants of Health   Financial Resource Strain: Medium Risk (12/31/2021)   Overall Financial Resource Strain (CARDIA)    Difficulty of Paying Living Expenses: Somewhat hard  Food Insecurity: Not on file  Transportation Needs: Not on file  Physical Activity: Not on file  Stress: Not on file  Social Connections: Not on file     Review of Systems  Constitutional:  Negative for appetite change and unexpected weight change.   HENT:  Negative for congestion and sinus pressure.   Respiratory:  Negative for cough, chest tightness and shortness of breath.   Cardiovascular:  Negative for chest pain, palpitations and leg swelling.  Gastrointestinal:  Negative for abdominal pain, diarrhea, nausea and vomiting.  Genitourinary:  Negative for difficulty urinating and dysuria.  Musculoskeletal:  Negative for joint swelling and myalgias.       Right hip/buttock pain.    Skin:  Negative for color change and rash.  Neurological:  Negative for dizziness, light-headedness and headaches.  Psychiatric/Behavioral:  Negative for agitation and dysphoric mood.        Increased stress as outlined.        Objective:     BP 134/80 (BP Location: Left Arm, Patient Position: Sitting, Cuff Size: Large)   Pulse 76   Temp 97.9 F (36.6 C) (Temporal)   Resp 15   Ht _0  (1.6 m)   Wt 229 lb 4.8 oz (104 kg)   SpO2 97%   BMI 40.62 kg/m  Wt Readings from Last 3 Encounters:  07/29/22 229 lb 4.8 oz (104 kg)  03/02/22 230 lb 2 oz (104.4 kg)  02/24/22 229 lb 6 oz (104 kg)    Physical Exam Vitals reviewed.  Constitutional:      General: She is not in acute distress.    Appearance: Normal appearance.  HENT:     Head: Normocephalic and atraumatic.     Right Ear: External ear normal.     Left Ear: External ear normal.  Eyes:     General: No scleral icterus.       Right eye: No discharge.        Left eye: No discharge.     Conjunctiva/sclera: Conjunctivae normal.  Neck:     Thyroid: No thyromegaly.  Cardiovascular:     Rate and Rhythm: Normal rate and regular rhythm.  Pulmonary:     Effort: No respiratory distress.     Breath sounds: Normal breath sounds. No wheezing.  Abdominal:     General: Bowel sounds are normal.     Palpations: Abdomen is soft.     Tenderness: There is no abdominal tenderness.  Musculoskeletal:        General: No swelling or tenderness.     Cervical back: Neck supple. No tenderness.   Lymphadenopathy:     Cervical: No cervical adenopathy.  Skin:    Findings: No erythema or rash.  Neurological:     Mental Status: She is  alert.  Psychiatric:        Mood and Affect: Mood normal.        Behavior: Behavior normal.      Outpatient Encounter Medications as of 07/29/2022  Medication Sig   albuterol (VENTOLIN HFA) 108 (90 Base) MCG/ACT inhaler INHALE 2 PUFFS INTO THE LUNGS EVERY 6 HOURS AS NEEDED FOR WHEEZING OR SHORTNESS OF BREATH   diltiazem (TIAZAC) 360 MG 24 hr capsule TAKE 1 CAPSULE(360 MG) BY MOUTH DAILY   ELIQUIS 5 MG TABS tablet TAKE 1 TABLET(5 MG) BY MOUTH TWICE DAILY   enalapril (VASOTEC) 20 MG tablet TAKE 1 TABLET BY MOUTH EVERY MORNING AND 2 TABLETS EVERY AFTERNOON   FLUoxetine (PROZAC) 20 MG capsule TAKE 1 CAPSULE(20 MG) BY MOUTH DAILY   fluticasone (FLONASE) 50 MCG/ACT nasal spray SHAKE LIQUID AND USE 2 SPRAYS IN EACH NOSTRIL DAILY   Glycopyrrolate-Formoterol (BEVESPI AEROSPHERE) 9-4.8 MCG/ACT AERO Inhale 2 puffs into the lungs in the morning and at bedtime. Complete supply of Spiriva, then switch to Bevespi when received from patient assistance   Multiple Vitamins-Minerals (PRESERVISION AREDS) CAPS Take 1 capsule by mouth daily.   mupirocin ointment (BACTROBAN) 2 % Apply 1 application. topically 2 (two) times daily.   pravastatin (PRAVACHOL) 10 MG tablet TAKE 1 TABLET(10 MG) BY MOUTH DAILY   Probiotic Product (PROBIOTIC DAILY PO) Take 1 tablet by mouth.   triamterene-hydrochlorothiazide (MAXZIDE-25) 37.5-25 MG tablet TAKE 1 TABLET BY MOUTH DAILY   VITAMIN D, CHOLECALCIFEROL, PO Take by mouth daily.   No facility-administered encounter medications on file as of 07/29/2022.     Lab Results  Component Value Date   WBC 6.5 07/26/2022   HGB 14.8 07/26/2022   HCT 45.2 07/26/2022   PLT 232 07/26/2022   GLUCOSE 106 (H) 07/26/2022   CHOL 158 07/26/2022   TRIG 76 07/26/2022   HDL 57 07/26/2022   LDLCALC 86 07/26/2022   ALT 13 07/26/2022   AST 14 07/26/2022    NA 139 07/26/2022   K 3.8 07/26/2022   CL 97 07/26/2022   CREATININE 0.91 07/26/2022   BUN 14 07/26/2022   CO2 24 07/26/2022   TSH 3.220 07/26/2022   HGBA1C 5.8 (H) 07/26/2022       Assessment & Plan:   Problem List Items Addressed This Visit     Acquired thrombophilia ([place])    On eliquis.  Afib.       Aortic atherosclerosis (H[facility])    Continue pravastatin      Atrial fibrillation ([facility])    No increased heart rate or palpitations.  Continue eliquis.  On diltiazem.  Follow.        Chronic obstructive pulmonary disease (H[facility])    Has been followed by pulmonary.  Breathing stable.         Essential hypertension, benign    Continue diltiazem and enalapril.  Follow pressures.       Relevant Orders   Basic Metabolic Panel (BMET)   Hyperglycemia    Low carb diet and exercise.  Follow met b and a1c.       Relevant Orders   HgB A1c   PAD (peripheral artery disease) (HCC)    Affecting common iliac arteries mainly.  Continue risk factor modification.  Continue pravastatin.       Pure hypercholesterolemia    Pravastatin.  Follow lipid panel and liver function tests.  Low cholesterol diet and exercise.  Intolerance to other statin medication. Intolerance to zetia.       Relevant Orders  Hepatic function panel   Lipid Profile   Right hip pain    Right hip/buttock pain.  Recent aggravation - sleeping on couch.  Does not feel needs any further intervention.  Follow.       Severe obesity (BMI >= 40) (HCC)    Diet and exercise.  Follow.      Stress    Increased stress as outlined.  Does not feel needs any further intervention.  Follow.       Tobacco abuse    Discussed recommendation to quit.  Follow.       Other Visit Diagnoses     Encounter for screening mammogram for malignant neoplasm of breast    -  Primary   Relevant Orders   MM 3D SCREEN BREAST BILATERAL        Einar Pheasant, MD

## 2022-08-01 ENCOUNTER — Encounter: Payer: Self-pay | Admitting: Internal Medicine

## 2022-08-01 NOTE — Assessment & Plan Note (Signed)
Continue pravastatin 

## 2022-08-01 NOTE — Assessment & Plan Note (Addendum)
Has been followed by pulmonary.  Breathing stable.  

## 2022-08-01 NOTE — Assessment & Plan Note (Signed)
Right hip/buttock pain.  Recent aggravation - sleeping on couch.  Does not feel needs any further intervention.  Follow.

## 2022-08-01 NOTE — Assessment & Plan Note (Signed)
Low carb diet and exercise.  Follow met b and a1c.  

## 2022-08-01 NOTE — Assessment & Plan Note (Signed)
Increased stress as outlined.  Does not feel needs any further intervention.  Follow.  

## 2022-08-01 NOTE — Assessment & Plan Note (Signed)
Pravastatin.  Follow lipid panel and liver function tests.  Low cholesterol diet and exercise.  Intolerance to other statin medication. Intolerance to zetia.  

## 2022-08-01 NOTE — Assessment & Plan Note (Signed)
Discussed recommendation to quit.  Follow.

## 2022-08-01 NOTE — Assessment & Plan Note (Signed)
On eliquis.  Afib.  

## 2022-08-01 NOTE — Assessment & Plan Note (Signed)
Continue diltiazem and enalapril.  Follow pressures.  

## 2022-08-01 NOTE — Assessment & Plan Note (Signed)
Diet and exercise.  Follow.  

## 2022-08-01 NOTE — Assessment & Plan Note (Signed)
No increased heart rate or palpitations.  Continue eliquis.  On diltiazem.  Follow.   

## 2022-08-01 NOTE — Assessment & Plan Note (Signed)
Affecting common iliac arteries mainly.  Continue risk factor modification.  Continue pravastatin.  

## 2022-08-04 ENCOUNTER — Encounter: Payer: Self-pay | Admitting: Dermatology

## 2022-08-04 ENCOUNTER — Ambulatory Visit: Payer: PPO | Admitting: Dermatology

## 2022-08-04 DIAGNOSIS — Z1283 Encounter for screening for malignant neoplasm of skin: Secondary | ICD-10-CM | POA: Diagnosis not present

## 2022-08-04 DIAGNOSIS — D1800 Hemangioma unspecified site: Secondary | ICD-10-CM

## 2022-08-04 DIAGNOSIS — L720 Epidermal cyst: Secondary | ICD-10-CM | POA: Diagnosis not present

## 2022-08-04 DIAGNOSIS — D489 Neoplasm of uncertain behavior, unspecified: Secondary | ICD-10-CM

## 2022-08-04 DIAGNOSIS — D229 Melanocytic nevi, unspecified: Secondary | ICD-10-CM | POA: Diagnosis not present

## 2022-08-04 DIAGNOSIS — Z85828 Personal history of other malignant neoplasm of skin: Secondary | ICD-10-CM

## 2022-08-04 DIAGNOSIS — L814 Other melanin hyperpigmentation: Secondary | ICD-10-CM | POA: Diagnosis not present

## 2022-08-04 DIAGNOSIS — D235 Other benign neoplasm of skin of trunk: Secondary | ICD-10-CM | POA: Diagnosis not present

## 2022-08-04 DIAGNOSIS — L821 Other seborrheic keratosis: Secondary | ICD-10-CM

## 2022-08-04 DIAGNOSIS — L578 Other skin changes due to chronic exposure to nonionizing radiation: Secondary | ICD-10-CM

## 2022-08-04 NOTE — Progress Notes (Signed)
Follow-Up Visit   Subjective  Danielle Yates is a 81 y.o. female who presents for the following: Annual Exam (1 year tbse, hx of bcc, reports some spots at left and right scalp and spot at left abdomen she would like checked. ).  The patient presents for Total-Body Skin Exam (TBSE) for skin cancer screening and mole check.  The patient has spots, moles and lesions to be evaluated, some may be new or changing and the patient has concerns that these could be cancer.   The following portions of the chart were reviewed this encounter and updated as appropriate:  Tobacco  Allergies  Meds  Problems  Med Hx  Surg Hx  Fam Hx      Review of Systems: No other skin or systemic complaints except as noted in HPI or Assessment and Plan.   Objective  Well appearing patient in no apparent distress; mood and affect are within normal limits.  A full examination was performed including scalp, head, eyes, ears, nose, lips, neck, chest, axillae, abdomen, back, buttocks, bilateral upper extremities, bilateral lower extremities, hands, feet, fingers, toes, fingernails, and toenails. All findings within normal limits unless otherwise noted below.  right scalp Smooth white papule  left mid abdomen 0.4 cm skin colored papule with red globules        Left Forearm - Anterior, Right Forearm - Anterior Subcutaneous nodule.    Assessment & Plan  [place] right scalp  Benign-appearing, observe.    Neoplasm of uncertain behavior left mid abdomen  Epidermal / dermal shaving  Lesion diameter (cm):  0.4 Informed consent: discussed and consent obtained   Timeout: patient name, date of birth, surgical site, and procedure verified   Patient was prepped and draped in usual sterile fashion: area prepped with isopropyl alcohol. Anesthesia: the lesion was anesthetized in a standard fashion   Anesthetic:  1% lidocaine w/ epinephrine 1-100,000 buffered w/ 8.4% NaHCO3 Instrument used: flexible razor  blade   Hemostasis achieved with: aluminum chloride   Outcome: patient tolerated procedure well   Post-procedure details: wound care instructions given   Additional details:  Mupirocin and a bandage applied  Anatomic Pathology Report  R/o traumatized sk vs other   Patient requested bx be sent to [place] former employer   Epidermal inclusion cyst (2) Left Forearm - Anterior; Right Forearm - Anterior  Benign-appearing. Exam most consistent with an epidermal inclusion cyst. Discussed that a cyst is a benign growth that can grow over time and sometimes get irritated or inflamed. Recommend observation if it is not bothersome. Discussed option of surgical excision to remove it if it is growing, symptomatic, or other changes noted. Please call for new or changing lesions so they can be evaluated.    Lentigines - Scattered tan macules - Due to sun exposure - Benign-appearing, observe - Recommend daily broad spectrum sunscreen SPF 30+ to sun-exposed areas, reapply every 2 hours as needed. - Call for any changes  Seborrheic Keratoses - Stuck-on, waxy, tan-brown papules and/or plaques  - Benign-appearing - Discussed benign etiology and prognosis. - Observe - Call for any changes  Melanocytic Nevi - Tan-brown and/or pink-flesh-colored symmetric macules and papules - Benign appearing on exam today - Observation - Call clinic for new or changing moles - Recommend daily use of broad spectrum spf 30+ sunscreen to sun-exposed areas.   Hemangiomas - Red papules - Discussed benign nature - Observe - Call for any changes  Actinic Damage - Chronic condition, secondary to cumulative UV/sun exposure -  diffuse scaly erythematous macules with underlying dyspigmentation - Recommend daily broad spectrum sunscreen SPF 30+ to sun-exposed areas, reapply every 2 hours as needed.  - Staying in the shade or wearing long sleeves, sun glasses (UVA+UVB protection) and wide brim hats (4-inch brim around  the entire circumference of the hat) are also recommended for sun protection.  - Call for new or changing lesions.  History of Basal Cell Carcinoma of the Skin  2019 upper back right of midline - No evidence of recurrence today - Recommend regular full body skin exams - Recommend daily broad spectrum sunscreen SPF 30+ to sun-exposed areas, reapply every 2 hours as needed.  - Call if any new or changing lesions are noted between office visits  Skin cancer screening performed today. Return in about 1 year (around 08/05/2023) for TBSE. I, Ruthell Rummage, CMA, am acting as scribe for Forest Gleason, MD.  Documentation: I have reviewed the above documentation for accuracy and completeness, and I agree with the above.  Forest Gleason, MD

## 2022-08-04 NOTE — Patient Instructions (Addendum)
Biopsy Wound Care Instructions  Leave the original bandage on for 24 hours if possible.  If the bandage becomes soaked or soiled before that time, it is OK to remove it and examine the wound.  A small amount of post-operative bleeding is normal.  If excessive bleeding occurs, remove the bandage, place gauze over the site and apply continuous pressure (no peeking) over the area for 30 minutes. If this does not work, please call our clinic as soon as possible or page your doctor if it is after hours.   Once a day, cleanse the wound with soap and water. It is fine to shower. If a thick crust develops you may use a Q-tip dipped into dilute hydrogen peroxide (mix 1:1 with water) to dissolve it.  Hydrogen peroxide can slow the healing process, so use it only as needed.    After washing, apply petroleum jelly (Vaseline) or an antibiotic ointment if your doctor prescribed one for you, followed by a bandage.    For best healing, the wound should be covered with a layer of ointment at all times. If you are not able to keep the area covered with a bandage to hold the ointment in place, this may mean re-applying the ointment several times a day.  Continue this wound care until the wound has healed and is no longer open.   Itching and mild discomfort is normal during the healing process. However, if you develop pain or severe itching, please call our office.   If you have any discomfort, you can take Tylenol (acetaminophen) or ibuprofen as directed on the bottle. (Please do not take these if you have an allergy to them or cannot take them for another reason).  Some redness, tenderness and white or yellow material in the wound is normal healing.  If the area becomes very sore and red, or develops a thick yellow-green material (pus), it may be infected; please notify us.    If you have stitches, return to clinic as directed to have the stitches removed. You will continue wound care for 2-3 days after the stitches  are removed.   Wound healing continues for up to one year following surgery. It is not unusual to experience pain in the scar from time to time during the interval.  If the pain becomes severe or the scar thickens, you should notify the office.    A slight amount of redness in a scar is expected for the first six months.  After six months, the redness will fade and the scar will soften and fade.  The color difference becomes less noticeable with time.  If there are any problems, return for a post-op surgery check at your earliest convenience.  To improve the appearance of the scar, you can use silicone scar gel, cream, or sheets (such as Mederma or Serica) every night for up to one year. These are available over the counter (without a prescription).  Please call our office at [phone] for any questions or concerns.     Recommend taking Heliocare sun protection supplement daily in sunny weather for additional sun protection. For maximum protection on the sunniest days, you can take up to 2 capsules of regular Heliocare OR take 1 capsule of Heliocare Ultra. For prolonged exposure (such as a full day in the sun), you can repeat your dose of the supplement 4 hours after your first dose. Heliocare can be purchased at [employer], at some [facility] or at [email or web address].    Melanoma  ABCDEs  Melanoma is the most dangerous type of skin cancer, and is the leading cause of death from skin disease.  You are more likely to develop melanoma if you: Have light-colored skin, light-colored eyes, or red or blond hair Spend a lot of time in the sun Tan regularly, either outdoors or in a tanning bed Have had blistering sunburns, especially during childhood Have a close family member who has had a melanoma Have atypical moles or large birthmarks  Early detection of melanoma is key since treatment is typically straightforward and cure rates are extremely high if we catch it early.   The first  sign of melanoma is often a change in a mole or a new dark spot.  The ABCDE system is a way of remembering the signs of melanoma.  A for asymmetry:  The two halves do not match. B for border:  The edges of the growth are irregular. C for color:  A mixture of colors are present instead of an even brown color. D for diameter:  Melanomas are usually (but not always) greater than 13m - the size of a pencil eraser. E for evolution:  The spot keeps changing in size, shape, and color.  Please check your skin once per month between visits. You can use a small mirror in front and a large mirror behind you to keep an eye on the back side or your body.   If you see any new or changing lesions before your next follow-up, please call to schedule a visit.  Please continue daily skin protection including broad spectrum sunscreen SPF 30+ to sun-exposed areas, reapplying every 2 hours as needed when you're outdoors.   Staying in the shade or wearing long sleeves, sun glasses (UVA+UVB protection) and wide brim hats (4-inch brim around the entire circumference of the hat) are also recommended for sun protection.    Due to recent changes in healthcare laws, you may see results of your pathology and/or laboratory studies on MyChart before the doctors have had a chance to review them. We understand that in some cases there may be results that are confusing or concerning to you. Please understand that not all results are received at the same time and often the doctors may need to interpret multiple results in order to provide you with the best plan of care or course of treatment. Therefore, we ask that you please give u[place]2 business days to thoroughly review all your results before contacting the office for clarification. Should we see a critical lab result, you will be contacted sooner.   If You Need Anything After Your Visit  If you have any questions or concerns for your doctor, please call our main line at  [phone] press option 4 to reach your doctor's medical assistant. If no one answers, please leave a voicemail as directed and we will return your call as soon as possible. Messages left after 4 pm will be answered the following business day.   You may also send [place] message via [place] We typically respond to MyChart messages within 1-2 business days.  For prescription refills, please ask your pharmacy to contact our office. Our fax number is [phone]  If you have an urgent issue when the clinic is closed that cannot wait until the next business day, you can page your doctor at the number below.    Please note that while we do our best to be available for urgent issues outside of office hours, we  are not available 24/7.   If you have an urgent issue and are unable to reach [place], you may choose to seek medical care at your doctor's office, retail clinic, urgent care center, or emergency room.  If you have a medical emergency, please immediately call 911 or go to the emergency department.  Pager Numbers  - Dr. Nehemiah Massed: [phone]  - Dr. Laurence Ferrari: [phone]  - Dr. Nicole Kindred: [phone]  In the event of inclement weather, please call our main line at [phone] for an update on the status of any delays or closures.  Dermatology Medication Tips: Please keep the boxes that topical medications come in in order to help keep track of the instructions about where and how to use these. Pharmacies typically print the medication instructions only on the boxes and not directly on the medication tubes.   If your medication is too expensive, please contact our office at [phone] option 4 or send [place] a message through [place].   We are unable to tell what your co-pay for medications will be in advance as this is different depending on your insurance coverage. However, we may be able to find a substitute medication at lower cost or fill out paperwork to get insurance to cover a needed  medication.   If a prior authorization is required to get your medication covered by your insurance company, please allow [place] 1-2 business days to complete this process.  Drug prices often vary depending on where the prescription is filled and some pharmacies may offer cheaper prices.  The website www.goodrx.com contains coupons for medications through different pharmacies. The prices here do not account for what the cost may be with help from insurance (it may be cheaper with your insurance), but the website can give you the price if you did not use any insurance.  - You can print the associated coupon and take it with your prescription to the pharmacy.  - You may also stop by our office during regular business hours and pick up a GoodRx coupon card.  - If you need your prescription sent electronically to a different pharmacy, notify our office through [facility] or by phone at [phone] option 4.     Si Usted Necesita Algo Despus de Su Visita  Tambin puede enviarnos un mensaje a travs de Pharmacist, community. Por lo general respondemos a los mensajes de MyChart en el transcurso de 1 a 2 das hbiles.  Para renovar recetas, por favor pida a su farmacia que se ponga en contacto con nuestra oficina. Harland Dingwall de fax es [place] [phone].  Si tiene un asunto urgente cuando la clnica est cerrada y que no puede esperar hasta el siguiente da hbil, puede llamar/localizar a su doctor(a) al nmero que aparece a continuacin.   Por favor, tenga en cuenta que aunque hacemos todo lo posible para estar disponibles para asuntos urgentes fuera del horario de [place], no estamos disponibles las 24 horas del da, los 7 das de la [place].   Si tiene un problema urgente y no puede comunicarse con nosotros, puede optar por buscar atencin mdica  en el consultorio de su doctor(a), en una clnica privada, en un centro de atencin urgente o en una sala de emergencias.  Si tiene Engineering geologist,  por favor llame inmediatamente al 911 o vaya a la sala de emergencias.  Nmeros de bper  - Dr. Nehemiah Massed: [phone]  - Dra. Moye: [phone]  - Dra. Nicole Kindred: [phone]  En caso de inclemencias del [place], por  favor llame a nuestra lnea principal al [phone] para una actualizacin sobre el [place] de cualquier retraso o cierre.  Consejos para la medicacin en dermatologa: Por favor, guarde las cajas en las que vienen los medicamentos de uso tpico para ayudarle a seguir las instrucciones sobre dnde y cmo usarlos. Las farmacias generalmente imprimen las instrucciones del medicamento slo en las cajas y no directamente en los tubos del [place].   Si su medicamento es muy caro, por favor, pngase en contacto con Zigmund Daniel llamando al [phone] y presione la opcin 4 o envenos un mensaje a travs de Pharmacist, community.   No podemos decirle cul ser su copago por los medicamentos por adelantado ya que esto es diferente dependiendo de la cobertura de su seguro. Sin embargo, es posible que podamos encontrar un medicamento sustituto a Electrical engineer un formulario para que el seguro cubra el medicamento que se considera necesario.   Si se requiere una autorizacin previa para que su compaa de seguros [place] su medicamento, por favor permtanos de 1 a 2 das hbiles para completar este proceso.  Los precios de los medicamentos varan con frecuencia dependiendo del Environmental consultant de dnde se surte la receta y alguna farmacias pueden ofrecer precios ms baratos.  El sitio web www.goodrx.com tiene cupones para medicamentos de Airline pilot. Los precios aqu no tienen en cuenta lo que podra costar con la ayuda del seguro (puede ser ms barato con su seguro), pero el sitio web puede darle el precio si no utiliz Research scientist (physical sciences).  - Puede imprimir el cupn correspondiente y llevarlo con su receta a la farmacia.  - Tambin puede pasar por nuestra oficina durante el horario de atencin  regular y Charity fundraiser una tarjeta de cupones de GoodRx.  - Si necesita que su receta se enve electrnicamente a una farmacia diferente, informe a nuestra oficina a travs de MyChart de [place] o por telfono llamando al [phone] y presione la opcin 4.

## 2022-08-11 ENCOUNTER — Telehealth: Payer: Self-pay

## 2022-08-11 LAB — ANATOMIC PATHOLOGY REPORT

## 2022-08-11 NOTE — Telephone Encounter (Addendum)
Discussed bx results with patient. She verbalized understanding and denied further questions.  ----- Message from Alfonso Patten, MD sent at 08/11/2022 10:09 AM EDT ----- Comment: Specimen A-Skin Biopsy, left mid abdomen: ANGIOKERATOMA,  BENIGN. --> "benign blood vessel growth" no treatment needed  MAs please call. Thank you!

## 2022-09-06 DIAGNOSIS — H353211 Exudative age-related macular degeneration, right eye, with active choroidal neovascularization: Secondary | ICD-10-CM | POA: Diagnosis not present

## 2022-09-07 ENCOUNTER — Other Ambulatory Visit: Payer: Self-pay | Admitting: Cardiovascular Disease

## 2022-09-09 DIAGNOSIS — Z1231 Encounter for screening mammogram for malignant neoplasm of breast: Secondary | ICD-10-CM | POA: Diagnosis not present

## 2022-09-09 LAB — HM MAMMOGRAPHY

## 2022-09-21 ENCOUNTER — Ambulatory Visit: Payer: PPO

## 2022-09-28 ENCOUNTER — Ambulatory Visit (INDEPENDENT_AMBULATORY_CARE_PROVIDER_SITE_OTHER): Payer: PPO

## 2022-09-28 DIAGNOSIS — Z23 Encounter for immunization: Secondary | ICD-10-CM

## 2022-10-25 ENCOUNTER — Telehealth: Payer: Self-pay

## 2022-10-25 NOTE — Progress Notes (Signed)
[facility]) Care Management  [place]   10/25/2022  DENEANE STIFTER 10/23/1941 [id number]   2024 Medication Assistance Renewal Application Summary:  Patient was outreached by [place] Team regarding medication assistance renewal for 2024 for Bevespi. Verified address, anticipated insurance for 2024, and income has not changed. However, Ms. [place] reports that she received a letter last week from [employer] that she was automatically approved for Bevespi medication assistance until 12/06/2023.   Thank you for allowing pharmacy to be a part of this patient's care.  Kristeen Miss, PharmD Clinical Pharmacist [place] Cell: [phone]

## 2022-11-16 ENCOUNTER — Other Ambulatory Visit: Payer: Self-pay | Admitting: Cardiovascular Disease

## 2022-11-16 NOTE — Telephone Encounter (Signed)
Prescription refill request for Eliquis received. Indication: AF Last office visit: 02/24/22  R Dunn PA-C Scr: 0.91 on 07/26/22 Age: 81 Weight: 104kg  Based on above findings Eliquis '5mg'$  twice daily is the appropriate dose.  Refill approved.

## 2022-12-08 ENCOUNTER — Encounter: Payer: Self-pay | Admitting: Internal Medicine

## 2022-12-08 ENCOUNTER — Telehealth (INDEPENDENT_AMBULATORY_CARE_PROVIDER_SITE_OTHER): Payer: PPO | Admitting: Internal Medicine

## 2022-12-08 VITALS — Ht 63.0 in | Wt 220.0 lb

## 2022-12-08 DIAGNOSIS — D6869 Other thrombophilia: Secondary | ICD-10-CM

## 2022-12-08 DIAGNOSIS — R739 Hyperglycemia, unspecified: Secondary | ICD-10-CM | POA: Diagnosis not present

## 2022-12-08 DIAGNOSIS — I739 Peripheral vascular disease, unspecified: Secondary | ICD-10-CM

## 2022-12-08 DIAGNOSIS — E7800 Pure hypercholesterolemia, unspecified: Secondary | ICD-10-CM | POA: Diagnosis not present

## 2022-12-08 DIAGNOSIS — I700 Atherosclerosis of aorta: Secondary | ICD-10-CM

## 2022-12-08 DIAGNOSIS — I10 Essential (primary) hypertension: Secondary | ICD-10-CM

## 2022-12-08 DIAGNOSIS — R051 Acute cough: Secondary | ICD-10-CM | POA: Diagnosis not present

## 2022-12-08 DIAGNOSIS — J449 Chronic obstructive pulmonary disease, unspecified: Secondary | ICD-10-CM

## 2022-12-08 DIAGNOSIS — F419 Anxiety disorder, unspecified: Secondary | ICD-10-CM | POA: Diagnosis not present

## 2022-12-08 DIAGNOSIS — I4821 Permanent atrial fibrillation: Secondary | ICD-10-CM

## 2022-12-08 DIAGNOSIS — R5383 Other fatigue: Secondary | ICD-10-CM

## 2022-12-08 MED ORDER — CEFDINIR 300 MG PO CAPS: 300.0000 mg | ORAL_CAPSULE | Freq: Two times a day (BID) | ORAL | 0 refills | Status: DC

## 2022-12-08 NOTE — Progress Notes (Unsigned)
Patient ID: Danielle Yates, female   DOB: 14-Mar-1941, 82 y.o.   MRN: [id number]   Virtual Visit via video Note   All issues noted in this document were discussed and addressed.  No physical exam was performed (except for noted visual exam findings with Video Visits).   I connected with Danielle Yates by a video enabled telemedicine application and verified that I am speaking with the correct person using two identifiers. Location patient: home Location provider: work  Persons participating in the virtual visit: patient, provider  The limitations, risks, security and privacy concerns of performing an evaluation and management service by video and the availability of in person appointments have been discussed.  It has also been discussed with the patient that there may be a patient responsible charge related to this service. The patient expressed understanding and agreed to proceed.  Interactive audio and video telecommunications were attempted between this provider and patient, however failed, due to patient having technical difficulties OR patient did not have access to video capability.  We continued and completed visit with audio only. ***  Reason for visit: follow up appt.   HPI: Here to follow up regarding her blood pressure, blood sugar, afib and hypercholesterolemia.  Also with documented COPD.  Breathing has been stable.    ROS: See pertinent positives and negatives per HPI.  Past Medical History:  Diagnosis Date   Allergy    Aortic valve sclerosis    a. TTE 2013: EF 60-65%, no RWMA, Gr1DD, trivial MR, mildly dilated LA 43 mm; b. TTE 08/2017: EF 65-70%, mild LVH, aortic valve sclerosis without stenosis, mildly dilated LA 36 mm, RV size and sys fxn nl   Arthropathy, unspecified, site unspecified    Basal cell carcinoma 06/15/2018   upper back right of midline/superficial   Cataract    Chronic rhinitis    Disorder of bone and cartilage, unspecified    Diverticulosis of colon  (without mention of hemorrhage)    Essential hypertension    a. refractory HTN; b. renal US 2013 negative for RAS   External hemorrhoids without mention of complication    Medication intolerance    Obesity, unspecified    Other abnormal glucose    Panic disorder without agoraphobia    Peripheral arterial disease ([place])    a.  Known bilateral common iliac disease-> medical therapy-asymptomatic.   Persistent atrial fibrillation ([place])    a. diagnosed 10/19/17; b. CHADS2VASc 5 (HTN, age x 2, vascular disease, female)-->Eliquis   Pure hypercholesterolemia    Tobacco use disorder    Unspecified vitamin D deficiency     Past Surgical History:  Procedure Laterality Date   [place]   with open wound   EYE SURGERY  2011   CATARACTS -  BOTH   Removed spot on face     [place]   fibroid and endometriosis; one ovary remaining    Family History  Problem Relation Age of Onset   Hypertension Sister    Heart disease Brother    Hypertension Brother    Hyperlipidemia Brother    Cancer Brother    Aneurysm Mother    Rheumatic fever Mother        as an adult   Heart disease Father        MI x 2    SOCIAL HX: ***   Current Outpatient Medications:    albuterol (  VENTOLIN HFA) 108 (90 Base) MCG/ACT inhaler, INHALE 2 PUFFS INTO THE LUNGS EVERY 6 HOURS AS NEEDED FOR WHEEZING OR SHORTNESS OF BREATH, Disp: 8.5 g, Rfl: 2   cefdinir (OMNICEF) 300 MG capsule, Take 1 capsule (300 mg total) by mouth 2 (two) times daily., Disp: 20 capsule, Rfl: 0   diltiazem (TIAZAC) 360 MG 24 hr capsule, TAKE 1 CAPSULE(360 MG) BY MOUTH DAILY, Disp: 90 capsule, Rfl: 2   ELIQUIS 5 MG TABS tablet, TAKE 1 TABLET(5 MG) BY MOUTH TWICE DAILY, Disp: 180 tablet, Rfl: 1   enalapril (VASOTEC) 20 MG tablet, TAKE 1 TABLET BY MOUTH EVERY MORNING AND 2 TABLETS EVERY AFTERNOON, Disp: 270 tablet, Rfl: 2   FLUoxetine (PROZAC) 20 MG  capsule, TAKE 1 CAPSULE(20 MG) BY MOUTH DAILY, Disp: 90 capsule, Rfl: 1   fluticasone (FLONASE) 50 MCG/ACT nasal spray, SHAKE LIQUID AND USE 2 SPRAYS IN EACH NOSTRIL DAILY, Disp: 48 g, Rfl: 2   Glycopyrrolate-Formoterol (BEVESPI AEROSPHERE) 9-4.8 MCG/ACT AERO, Inhale 2 puffs into the lungs in the morning and at bedtime. Complete supply of Spiriva, then switch to Bevespi when received from patient assistance, Disp: 10.7 g, Rfl: 11   Multiple Vitamins-Minerals (PRESERVISION AREDS) CAPS, Take 1 capsule by mouth daily., Disp: , Rfl:    mupirocin ointment (BACTROBAN) 2 %, Apply 1 application. topically 2 (two) times daily., Disp: 22 g, Rfl: 0   pravastatin (PRAVACHOL) 10 MG tablet, TAKE 1 TABLET(10 MG) BY MOUTH DAILY, Disp: 90 tablet, Rfl: 3   Probiotic Product (PROBIOTIC DAILY PO), Take 1 tablet by mouth., Disp: , Rfl:    triamterene-hydrochlorothiazide (MAXZIDE-25) 37.5-25 MG tablet, TAKE 1 TABLET BY MOUTH DAILY, Disp: 90 tablet, Rfl: 0   VITAMIN D, CHOLECALCIFEROL, PO, Take by mouth daily., Disp: , Rfl:   EXAM:  VITALS per patient if applicable:  GENERAL: alert, oriented, appears well and in no acute distress  HEENT: atraumatic, conjunttiva clear, no obvious abnormalities on inspection of external nose and ears  NECK: normal movements of the head and neck  LUNGS: on inspection no signs of respiratory distress, breathing rate appears normal, no obvious gross SOB, gasping or wheezing  CV: no obvious cyanosis  MS: moves all visible extremities without noticeable abnormality  PSYCH/NEURO: pleasant and cooperative, no obvious depression or anxiety, speech and thought processing grossly intact  ASSESSMENT AND PLAN:  Discussed the following assessment and plan:  Problem List Items Addressed This Visit     Essential hypertension, benign - Primary   Relevant Orders   Basic metabolic panel   Fatigue   Relevant Orders   CBC with Differential/Platelet   Vitamin B12   TSH   Hyperglycemia    Relevant Orders   Hemoglobin A1c   Pure hypercholesterolemia   Relevant Orders   Hepatic function panel   Lipid panel   TSH    No follow-ups on file.   I discussed the assessment and treatment plan with the patient. The patient was provided an opportunity to ask questions and all were answered. The patient agreed with the plan and demonstrated an understanding of the instructions.   The patient was advised to call back or seek an in-person evaluation if the symptoms worsen or if the condition fails to improve as anticipated.  I provided *** minutes of non-face-to-face time during this encounter.   Einar Pheasant, MD

## 2022-12-09 ENCOUNTER — Encounter: Payer: Self-pay | Admitting: Internal Medicine

## 2022-12-09 NOTE — Assessment & Plan Note (Signed)
No increased heart rate or palpitations.  Continue eliquis.  On diltiazem.  Follow.

## 2022-12-09 NOTE — Assessment & Plan Note (Signed)
Affecting common iliac arteries mainly.  Continue risk factor modification.  Continue pravastatin.

## 2022-12-09 NOTE — Assessment & Plan Note (Signed)
Reports increased fatigue.  Noticed some prior to infection.  Check routine labs including cbc and B12.

## 2022-12-09 NOTE — Assessment & Plan Note (Signed)
Pravastatin.  Follow lipid panel and liver function tests.  Low cholesterol diet and exercise.  Intolerance to other statin medication. Intolerance to zetia.

## 2022-12-09 NOTE — Assessment & Plan Note (Signed)
Has been followed by pulmonary.  Treat current infection.  Has not required rescue inhaler.  Breathing overall stable.

## 2022-12-09 NOTE — Assessment & Plan Note (Signed)
Continue pravastatin 

## 2022-12-09 NOTE — Assessment & Plan Note (Signed)
Low carb diet and exercise.  Follow met b and a1c.  

## 2022-12-09 NOTE — Assessment & Plan Note (Signed)
Has done well on prozac.  Follow.

## 2022-12-09 NOTE — Assessment & Plan Note (Signed)
Continue diltiazem and enalapril.  Follow pressures.

## 2022-12-09 NOTE — Assessment & Plan Note (Signed)
On eliquis.  Afib.

## 2022-12-09 NOTE — Assessment & Plan Note (Signed)
Cough/congestion as outlined.  Continue mucinex.  Saline nasal spray and flonase as directed.  Continue spiriva. Rx sent in for omnicef.  If continued improvement, will hold getting abx prescription.  Follow.  Call with update.

## 2022-12-14 ENCOUNTER — Other Ambulatory Visit: Payer: Self-pay | Admitting: Cardiovascular Disease

## 2022-12-20 DIAGNOSIS — Z961 Presence of intraocular lens: Secondary | ICD-10-CM | POA: Diagnosis not present

## 2022-12-20 DIAGNOSIS — H353124 Nonexudative age-related macular degeneration, left eye, advanced atrophic with subfoveal involvement: Secondary | ICD-10-CM | POA: Diagnosis not present

## 2022-12-20 DIAGNOSIS — H353211 Exudative age-related macular degeneration, right eye, with active choroidal neovascularization: Secondary | ICD-10-CM | POA: Diagnosis not present

## 2022-12-27 DIAGNOSIS — Z961 Presence of intraocular lens: Secondary | ICD-10-CM | POA: Diagnosis not present

## 2022-12-27 DIAGNOSIS — H353211 Exudative age-related macular degeneration, right eye, with active choroidal neovascularization: Secondary | ICD-10-CM | POA: Diagnosis not present

## 2022-12-27 DIAGNOSIS — H26493 Other secondary cataract, bilateral: Secondary | ICD-10-CM | POA: Diagnosis not present

## 2022-12-28 ENCOUNTER — Telehealth: Payer: Self-pay | Admitting: Family

## 2022-12-30 ENCOUNTER — Other Ambulatory Visit: Payer: Self-pay

## 2022-12-30 MED ORDER — FLUOXETINE HCL 20 MG PO CAPS: 20.0000 mg | ORAL_CAPSULE | Freq: Every day | ORAL | 1 refills | Status: DC

## 2022-12-30 NOTE — Telephone Encounter (Signed)
Medication refilled

## 2022-12-30 NOTE — Telephone Encounter (Signed)
Pt need a refill on FLUoxetine sent to [facility] 

## 2023-02-02 DIAGNOSIS — R5383 Other fatigue: Secondary | ICD-10-CM | POA: Diagnosis not present

## 2023-02-02 DIAGNOSIS — I10 Essential (primary) hypertension: Secondary | ICD-10-CM | POA: Diagnosis not present

## 2023-02-02 DIAGNOSIS — R739 Hyperglycemia, unspecified: Secondary | ICD-10-CM | POA: Diagnosis not present

## 2023-02-02 DIAGNOSIS — E7800 Pure hypercholesterolemia, unspecified: Secondary | ICD-10-CM | POA: Diagnosis not present

## 2023-02-03 LAB — HEMOGLOBIN A1C
Est. average glucose Bld gHb Est-mCnc: 117 mg/dL
Hgb A1c MFr Bld: 5.7 % — ABNORMAL HIGH (ref 4.8–5.6)

## 2023-02-03 LAB — HEPATIC FUNCTION PANEL
ALT: 15 IU/L (ref 0–32)
AST: 17 IU/L (ref 0–40)
Albumin: 4.4 g/dL (ref 3.7–4.7)
Alkaline Phosphatase: 65 IU/L (ref 44–121)
Bilirubin Total: 1.1 mg/dL (ref 0.0–1.2)
Bilirubin, Direct: 0.27 mg/dL (ref 0.00–0.40)
Total Protein: 6.8 g/dL (ref 6.0–8.5)

## 2023-02-03 LAB — VITAMIN B12: Vitamin B-12: 310 pg/mL (ref 232–1245)

## 2023-02-03 LAB — CBC WITH DIFFERENTIAL/PLATELET
Basophils Absolute: 0.1 10*3/uL (ref 0.0–0.2)
Basos: 1 %
EOS (ABSOLUTE): 0.1 10*3/uL (ref 0.0–0.4)
Eos: 2 %
Hematocrit: 45.3 % (ref 34.0–46.6)
Hemoglobin: 14.7 g/dL (ref 11.1–15.9)
Immature Grans (Abs): 0 10*3/uL (ref 0.0–0.1)
Immature Granulocytes: 0 %
Lymphocytes Absolute: 2 10*3/uL (ref 0.7–3.1)
Lymphs: 29 %
MCH: 29.3 pg (ref 26.6–33.0)
MCHC: 32.5 g/dL (ref 31.5–35.7)
MCV: 90 fL (ref 79–97)
Monocytes Absolute: 0.5 10*3/uL (ref 0.1–0.9)
Monocytes: 7 %
Neutrophils Absolute: 4.1 10*3/uL (ref 1.4–7.0)
Neutrophils: 61 %
Platelets: 252 10*3/uL (ref 150–450)
RBC: 5.02 x10E6/uL (ref 3.77–5.28)
RDW: 12.7 % (ref 11.7–15.4)
WBC: 6.8 10*3/uL (ref 3.4–10.8)

## 2023-02-03 LAB — BASIC METABOLIC PANEL
BUN/Creatinine Ratio: 15 (ref 12–28)
BUN: 13 mg/dL (ref 8–27)
CO2: 25 mmol/L (ref 20–29)
Calcium: 9.6 mg/dL (ref 8.7–10.3)
Chloride: 98 mmol/L (ref 96–106)
Creatinine, Ser: 0.87 mg/dL (ref 0.57–1.00)
Glucose: 103 mg/dL — ABNORMAL HIGH (ref 70–99)
Potassium: 3.6 mmol/L (ref 3.5–5.2)
Sodium: 139 mmol/L (ref 134–144)
eGFR: 67 mL/min/{1.73_m2} (ref >59)

## 2023-02-03 LAB — LIPID PANEL
Chol/HDL Ratio: 2.6 ratio (ref 0.0–4.4)
Cholesterol, Total: 162 mg/dL (ref 100–199)
HDL: 63 mg/dL (ref >39)
LDL Chol Calc (NIH): 83 mg/dL (ref 0–99)
Triglycerides: 85 mg/dL (ref 0–149)
VLDL Cholesterol Cal: 16 mg/dL (ref 5–40)

## 2023-02-03 LAB — TSH: TSH: 2.71 u[IU]/mL (ref 0.450–4.500)

## 2023-02-08 ENCOUNTER — Encounter: Payer: Self-pay | Admitting: Internal Medicine

## 2023-02-08 ENCOUNTER — Ambulatory Visit (INDEPENDENT_AMBULATORY_CARE_PROVIDER_SITE_OTHER): Payer: PPO | Admitting: Internal Medicine

## 2023-02-08 VITALS — BP 122/78 | HR 90 | Temp 98.1°F | Resp 16 | Ht 64.0 in | Wt 221.0 lb

## 2023-02-08 DIAGNOSIS — H9190 Unspecified hearing loss, unspecified ear: Secondary | ICD-10-CM

## 2023-02-08 DIAGNOSIS — E7800 Pure hypercholesterolemia, unspecified: Secondary | ICD-10-CM

## 2023-02-08 DIAGNOSIS — Z720 Tobacco use: Secondary | ICD-10-CM

## 2023-02-08 DIAGNOSIS — D6869 Other thrombophilia: Secondary | ICD-10-CM

## 2023-02-08 DIAGNOSIS — E538 Deficiency of other specified B group vitamins: Secondary | ICD-10-CM

## 2023-02-08 DIAGNOSIS — F419 Anxiety disorder, unspecified: Secondary | ICD-10-CM

## 2023-02-08 DIAGNOSIS — I10 Essential (primary) hypertension: Secondary | ICD-10-CM | POA: Diagnosis not present

## 2023-02-08 DIAGNOSIS — I700 Atherosclerosis of aorta: Secondary | ICD-10-CM | POA: Diagnosis not present

## 2023-02-08 DIAGNOSIS — I739 Peripheral vascular disease, unspecified: Secondary | ICD-10-CM | POA: Diagnosis not present

## 2023-02-08 DIAGNOSIS — I4821 Permanent atrial fibrillation: Secondary | ICD-10-CM | POA: Diagnosis not present

## 2023-02-08 DIAGNOSIS — R739 Hyperglycemia, unspecified: Secondary | ICD-10-CM

## 2023-02-08 DIAGNOSIS — J3489 Other specified disorders of nose and nasal sinuses: Secondary | ICD-10-CM | POA: Diagnosis not present

## 2023-02-08 DIAGNOSIS — J449 Chronic obstructive pulmonary disease, unspecified: Secondary | ICD-10-CM | POA: Diagnosis not present

## 2023-02-08 MED ORDER — FLUTICASONE PROPIONATE 50 MCG/ACT NA SUSP: NASAL | 2 refills | Status: DC

## 2023-02-08 MED ORDER — PRAVASTATIN SODIUM 10 MG PO TABS: ORAL_TABLET | ORAL | 3 refills | Status: DC

## 2023-02-08 MED ORDER — MUPIROCIN 2 % EX OINT: 1.0000 | TOPICAL_OINTMENT | Freq: Two times a day (BID) | CUTANEOUS | 0 refills | Status: DC

## 2023-02-08 MED ORDER — FLUOXETINE HCL 20 MG PO CAPS: 20.0000 mg | ORAL_CAPSULE | Freq: Every day | ORAL | 1 refills | Status: DC

## 2023-02-08 NOTE — Patient Instructions (Signed)
1095mg B12 - oral - per day

## 2023-02-08 NOTE — Progress Notes (Signed)
Subjective:    Patient ID: Danielle Yates, female    DOB: 1941-10-08, 82 y.o.   MRN: [id number]  Patient here for  Chief Complaint  Patient presents with   Medical Management of Chronic Issues    HPI Here for follow up regarding her blood pressure, blood sugar, afib and hypercholesterolemia. Also with documented COPD. Reports breathing relatively stable.  Notices some change - with weather changing.  Sore - left side nares.  Ears - full.  No significant sinus congestion or pressure.  No chest pain reported.  No abdominal pain or bowel change reported.    Past Medical History:  Diagnosis Date   Allergy    Aortic valve sclerosis    a. TTE 2013: EF 60-65%, no RWMA, Gr1DD, trivial MR, mildly dilated LA 43 mm; b. TTE 08/2017: EF 65-70%, mild LVH, aortic valve sclerosis without stenosis, mildly dilated LA 36 mm, RV size and sys fxn nl   Arthropathy, unspecified, site unspecified    Basal cell carcinoma 06/15/2018   upper back right of midline/superficial   Cataract    Chronic rhinitis    Disorder of bone and cartilage, unspecified    Diverticulosis of colon (without mention of hemorrhage)    Essential hypertension    a. refractory HTN; b. renal US 2013 negative for RAS   External hemorrhoids without mention of complication    Medication intolerance    Obesity, unspecified    Other abnormal glucose    Panic disorder without agoraphobia    Peripheral arterial disease ([place])    a.  Known bilateral common iliac disease-> medical therapy-asymptomatic.   Persistent atrial fibrillation ([place])    a. diagnosed 10/19/17; b. CHADS2VASc 5 (HTN, age x 2, vascular disease, female)-->Eliquis   Pure hypercholesterolemia    Tobacco use disorder    Unspecified vitamin D deficiency    Past Surgical History:  Procedure Laterality Date   [place]   with open wound   EYE SURGERY  2011   CATARACTS -  BOTH   Removed spot on face     [place]   fibroid and endometriosis; one ovary remaining   Family History  Problem Relation Age of Onset   Hypertension Sister    Heart disease Brother    Hypertension Brother    Hyperlipidemia Brother    Cancer Brother    Aneurysm Mother    Rheumatic fever Mother        as an adult   Heart disease Father        MI x 2   Social History   Socioeconomic History   Marital status: Widowed    Spouse name: Not on file   Number of children: 0   Years of education: college   Highest education level: Not on file  Occupational History   Occupation: Estate manager/land agent    Comment: [employer] x 20 years  Tobacco Use   Smoking status: Every Day    Packs/day: 0.50    Years: 50.00    Total pack years: 25.00    Types: Cigarettes   Smokeless tobacco: Never   Tobacco comments:    10 a day  Vaping Use   Vaping Use: Never used  Substance and Sexual Activity   Alcohol use: No    Alcohol/week: 0.0 standard drinks of alcohol   Drug use: No   Sexual activity: Never  Other  Topics Concern   Not on file  Social History Narrative   Lives alone. Widowed after 23 years. Not dating. Has dog. Exercise: Walking; light has not been walking as she used to before her 2 dogs passed away. Tries to walk some. Caffeine: Carbonated beverages, 3 servings/day;Diet coke. 3 a day.      10/02/12  PER PATIENT'S LAVENDER FORM: WALK 5 DAYS/WEEK FOR 30 MINUTES   Social Determinants of Health   Financial Resource Strain: Medium Risk (12/31/2021)   Overall Financial Resource Strain (CARDIA)    Difficulty of Paying Living Expenses: Somewhat hard  Food Insecurity: Not on file  Transportation Needs: Not on file  Physical Activity: Not on file  Stress: Not on file  Social Connections: Not on file     Review of Systems  Constitutional:  Negative for appetite change and unexpected weight change.  HENT:  Negative for ear pain and sinus pressure.        No increased  congestion.  Sore - right nares.  Ear issues as outlined.   Respiratory:  Negative for cough and chest tightness.        Breathing stable.   Cardiovascular:  Negative for chest pain and palpitations.  Gastrointestinal:  Negative for abdominal pain, diarrhea, nausea and vomiting.  Genitourinary:  Negative for difficulty urinating and dysuria.  Musculoskeletal:  Negative for joint swelling and myalgias.  Skin:  Negative for color change and rash.  Neurological:  Negative for dizziness and headaches.  Psychiatric/Behavioral:  Negative for agitation and dysphoric mood.        Objective:     BP 122/78   Pulse 90   Temp 98.1 F (36.7 C)   Resp 16   Ht '5\' 4"'$  (1.626 m)   Wt 221 lb (100.2 kg)   SpO2 97%   BMI 37.93 kg/m  Wt Readings from Last 3 Encounters:  02/08/23 221 lb (100.2 kg)  12/08/22 220 lb (99.8 kg)  07/29/22 229 lb 4.8 oz (104 kg)    Physical Exam Vitals reviewed.  Constitutional:      General: She is not in acute distress.    Appearance: Normal appearance.  HENT:     Head: Normocephalic and atraumatic.     Right Ear: Tympanic membrane, ear canal and external ear normal.     Left Ear: Tympanic membrane, ear canal and external ear normal.     Nose:     Comments: Right nares - erythematous lesion.  Eyes:     General: No scleral icterus.       Right eye: No discharge.        Left eye: No discharge.     Conjunctiva/sclera: Conjunctivae normal.  Neck:     Thyroid: No thyromegaly.  Cardiovascular:     Rate and Rhythm: Normal rate and regular rhythm.  Pulmonary:     Effort: No respiratory distress.     Breath sounds: Normal breath sounds. No wheezing.  Abdominal:     General: Bowel sounds are normal.     Palpations: Abdomen is soft.     Tenderness: There is no abdominal tenderness.  Musculoskeletal:        General: No swelling or tenderness.     Cervical back: Neck supple. No tenderness.  Lymphadenopathy:     Cervical: No cervical adenopathy.  Skin:     Findings: No erythema or rash.  Neurological:     Mental Status: She is alert.  Psychiatric:        Mood and  Affect: Mood normal.        Behavior: Behavior normal.      Outpatient Encounter Medications as of 02/08/2023  Medication Sig   mupirocin ointment (BACTROBAN) 2 % Apply 1 Application topically 2 (two) times daily.   albuterol (VENTOLIN HFA) 108 (90 Base) MCG/ACT inhaler INHALE 2 PUFFS INTO THE LUNGS EVERY 6 HOURS AS NEEDED FOR WHEEZING OR SHORTNESS OF BREATH   diltiazem (TIAZAC) 360 MG 24 hr capsule TAKE 1 CAPSULE(360 MG) BY MOUTH DAILY   ELIQUIS 5 MG TABS tablet TAKE 1 TABLET(5 MG) BY MOUTH TWICE DAILY   FLUoxetine (PROZAC) 20 MG capsule Take 1 capsule (20 mg total) by mouth daily.   fluticasone (FLONASE) 50 MCG/ACT nasal spray SHAKE LIQUID AND USE 2 SPRAYS IN EACH NOSTRIL DAILY   Glycopyrrolate-Formoterol (BEVESPI AEROSPHERE) 9-4.8 MCG/ACT AERO Inhale 2 puffs into the lungs in the morning and at bedtime. Complete supply of Spiriva, then switch to Bevespi when received from patient assistance   Multiple Vitamins-Minerals (PRESERVISION AREDS) CAPS Take 1 capsule by mouth daily.   pravastatin (PRAVACHOL) 10 MG tablet TAKE 1 TABLET(10 MG) BY MOUTH DAILY   Probiotic Product (PROBIOTIC DAILY PO) Take 1 tablet by mouth.   VITAMIN D, CHOLECALCIFEROL, PO Take by mouth daily.   [DISCONTINUED] cefdinir (OMNICEF) 300 MG capsule Take 1 capsule (300 mg total) by mouth 2 (two) times daily.   [DISCONTINUED] enalapril (VASOTEC) 20 MG tablet TAKE 1 TABLET BY MOUTH EVERY MORNING AND 2 TABLETS EVERY AFTERNOON   [DISCONTINUED] FLUoxetine (PROZAC) 20 MG capsule Take 1 capsule (20 mg total) by mouth daily.   [DISCONTINUED] fluticasone (FLONASE) 50 MCG/ACT nasal spray SHAKE LIQUID AND USE 2 SPRAYS IN EACH NOSTRIL DAILY   [DISCONTINUED] mupirocin ointment (BACTROBAN) 2 % Apply 1 application. topically 2 (two) times daily.   [DISCONTINUED] pravastatin (PRAVACHOL) 10 MG tablet TAKE 1 TABLET(10 MG) BY MOUTH  DAILY   [DISCONTINUED] triamterene-hydrochlorothiazide (MAXZIDE-25) 37.5-25 MG tablet Take 1 tablet by mouth daily. PLEASE SCHEDULE OFFICE VISIT FOR FURTHER REFILLS. THANK YOU!   No facility-administered encounter medications on file as of 02/08/2023.     Lab Results  Component Value Date   WBC 6.8 02/02/2023   HGB 14.7 02/02/2023   HCT 45.3 02/02/2023   PLT 252 02/02/2023   GLUCOSE 103 (H) 02/02/2023   CHOL 162 02/02/2023   TRIG 85 02/02/2023   HDL 63 02/02/2023   LDLCALC 83 02/02/2023   ALT 15 02/02/2023   AST 17 02/02/2023   NA 139 02/02/2023   K 3.6 02/02/2023   CL 98 02/02/2023   CREATININE 0.87 02/02/2023   BUN 13 02/02/2023   CO2 25 02/02/2023   TSH 2.710 02/02/2023   HGBA1C 5.7 (H) 02/02/2023    No results found.     Assessment & Plan:  Decreased hearing, unspecified laterality Assessment & Plan: Has noticed ear fullness and decreased hearing.  No cerumen impaction.  Refer to ENT for evaluation.   Orders: -     Ambulatory referral to ENT  Hyperglycemia Assessment & Plan: Low carb diet and exercise.  Follow met b and a1c.   Orders: -     Hemoglobin A1c  Essential hypertension, benign Assessment & Plan: Continue diltiazem and enalapril.  Follow pressures.   Orders: -     Basic metabolic panel  Pure hypercholesterolemia Assessment & Plan: Pravastatin.  Follow lipid panel and liver function tests.  Low cholesterol diet and exercise.  Intolerance to other statin medication. Intolerance to zetia.   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  Afib.    Anxiety Assessment & Plan: Has done well on prozac.  Follow.    Aortic atherosclerosis ([facility]) Assessment & Plan: Continue pravastatin   Permanent atrial fibrillation ([facility]) Assessment & Plan: No increased heart rate or palpitations.  Continue eliquis.  On diltiazem.  Follow.     Chronic obstructive pulmonary disease,  unspecified COPD type ([place]) Assessment & Plan: Has been followed by pulmonary.  Breathing overall stable. Continue current inhaler regimen.  Not requiring increased use of rescue inhaler.    PAD (peripheral artery disease) ([facility]) Assessment & Plan: Affecting common iliac arteries mainly.  Continue risk factor modification.  Continue pravastatin.    Tobacco abuse Assessment & Plan: Discussed recommendation to quit.  Follow.    Internal nasal lesion Assessment & Plan: Bactroban as directed.  Follow.  Notify me if does not completely resolve.    B12 deficiency Assessment & Plan: Recent B12 level - 310.  Start oral B12 1076mg q day.    Other orders -     Mupirocin; Apply 1 Application topically 2 (two) times daily.  Dispense: 22 g; Refill: 0 -     FLUoxetine HCl; Take 1 capsule (20 mg total) by mouth daily.  Dispense: 90 capsule; Refill: 1 -     Fluticasone Propionate; SHAKE LIQUID AND USE 2 SPRAYS IN EACH NOSTRIL DAILY  Dispense: 48 g; Refill: 2 -     Pravastatin Sodium; TAKE 1 TABLET(10 MG) BY MOUTH DAILY  Dispense: 90 tablet; Refill: 3     CEinar Pheasant MD

## 2023-02-09 ENCOUNTER — Other Ambulatory Visit: Payer: Self-pay | Admitting: Cardiovascular Disease

## 2023-02-09 ENCOUNTER — Other Ambulatory Visit: Payer: Self-pay | Admitting: Family

## 2023-02-09 NOTE — Telephone Encounter (Signed)
Please schedule F/U appointment for 90 day refills. Thank you!

## 2023-02-13 ENCOUNTER — Encounter: Payer: Self-pay | Admitting: Internal Medicine

## 2023-02-13 DIAGNOSIS — E538 Deficiency of other specified B group vitamins: Secondary | ICD-10-CM | POA: Insufficient documentation

## 2023-02-13 DIAGNOSIS — J3489 Other specified disorders of nose and nasal sinuses: Secondary | ICD-10-CM | POA: Insufficient documentation

## 2023-02-13 NOTE — Assessment & Plan Note (Signed)
Affecting common iliac arteries mainly.  Continue risk factor modification.  Continue pravastatin.  

## 2023-02-13 NOTE — Assessment & Plan Note (Signed)
Has done well on prozac.  Follow.  

## 2023-02-13 NOTE — Assessment & Plan Note (Signed)
Low carb diet and exercise.  Follow met b and a1c.   

## 2023-02-13 NOTE — Assessment & Plan Note (Signed)
Has been followed by pulmonary.  Breathing overall stable. Continue current inhaler regimen.  Not requiring increased use of rescue inhaler.

## 2023-02-13 NOTE — Assessment & Plan Note (Signed)
On eliquis.  Afib.  ?

## 2023-02-13 NOTE — Assessment & Plan Note (Signed)
Discussed recommendation to quit.  Follow.  

## 2023-02-13 NOTE — Assessment & Plan Note (Signed)
Has noticed ear fullness and decreased hearing.  No cerumen impaction.  Refer to ENT for evaluation.

## 2023-02-13 NOTE — Assessment & Plan Note (Signed)
Recent B12 level - 310.  Start oral B12 1038mg q day.

## 2023-02-13 NOTE — Assessment & Plan Note (Signed)
Continue pravastatin 

## 2023-02-13 NOTE — Assessment & Plan Note (Signed)
No increased heart rate or palpitations.  Continue eliquis.  On diltiazem.  Follow.   

## 2023-02-13 NOTE — Assessment & Plan Note (Signed)
Continue diltiazem and enalapril.  Follow pressures.  

## 2023-02-13 NOTE — Assessment & Plan Note (Signed)
Bactroban as directed.  Follow.  Notify me if does not completely resolve.

## 2023-02-13 NOTE — Assessment & Plan Note (Signed)
Pravastatin.  Follow lipid panel and liver function tests.  Low cholesterol diet and exercise.  Intolerance to other statin medication. Intolerance to zetia.  

## 2023-02-15 ENCOUNTER — Other Ambulatory Visit: Payer: Self-pay | Admitting: Internal Medicine

## 2023-02-16 ENCOUNTER — Telehealth: Payer: Self-pay

## 2023-02-16 ENCOUNTER — Other Ambulatory Visit: Payer: Self-pay

## 2023-02-16 DIAGNOSIS — I4821 Permanent atrial fibrillation: Secondary | ICD-10-CM

## 2023-02-16 MED ORDER — DILTIAZEM HCL ER BEADS 360 MG PO CP24: 360.0000 mg | ORAL_CAPSULE | Freq: Every day | ORAL | 3 refills | Status: DC

## 2023-02-16 MED ORDER — APIXABAN 5 MG PO TABS: ORAL_TABLET | ORAL | 1 refills | Status: DC

## 2023-02-16 NOTE — Telephone Encounter (Signed)
Incoming fax from [address] [facility] [place] requesting refills:  Eliquis 5 mg  Take one tablet ( '5mg'$ ) by mouth twice daily.

## 2023-02-16 NOTE — Addendum Note (Signed)
Addended by: Leonidas Romberg on: 02/16/2023 11:43 AM   Modules accepted: Orders

## 2023-02-16 NOTE — Telephone Encounter (Signed)
Prescription refill request for Eliquis received. Indication: Afib  Last office visit: 02/24/22 [facility] Primer) Scr: 0.87 (02/02/23)  Age: 82 Weight: 100.2kg  Appropriate dose. Refill sent.

## 2023-02-21 NOTE — Progress Notes (Signed)
Cardiology Office Note    Date:  02/25/2023   ID:  Danielle, Yates 03-27-1941, MRN [id number]  PCP:  Einar Pheasant, MD  Cardiologist:  Kathlyn Sacramento, MD  Electrophysiologist:  None   Chief Complaint: Follow-up  History of Present Illness:   Danielle Yates is a 82 y.o. female with history of permanent A. fib on Eliquis diagnosed in 10/2017, refractory hypertension, PAD, COPD with ongoing tobacco use, hyperlipidemia, and obesity who presents for follow-up of her A. Fib.   She has previously not tolerated metoprolol due to bradycardia and fatigue. She has not tolerated amlodipine or Coreg secondary to headache and dizziness. She is also known to be intolerant to high-intensity statins, tolerating pravastatin. She has previously undergone echo in 2013 that showed normal LV systolic function with an EF of 60-65%, no RWMA, Gr1DD, trivial mitral regurgitation, and a mildly dilated left atrium measuring 43 mm. Renal artery ultrasound in 2013 showed no evidence of RAS. She has known bilateral common iliac artery disease that is being managed medically given lack of claudication symptoms. She was diagnosed with A. fib with RVR in 10/2017 and was started on Eliquis at that time given a CHADS2VASc of 5.  She was asymptomatic.  Echo in 08/2017 showed normal LV systolic function with an EF of 65 to 70%, mild LVH, aortic valve sclerosis without stenosis, mildly dilated left atrium, RV cavity size is normal with normal RV systolic function.     She was last seen in the office in 02/2022 and was without symptoms of angina or cardiac decompensation.  She reported "brain fog" with pravastatin and ezetimibe.  She was referred to the lipid clinic for consideration of PCSK9 inhibitor.  She comes in doing well from a cardiac perspective and is without symptoms of angina or cardiac decompensation.  No dizziness, presyncope, or syncope.  No falls, hematochezia, or melena.  She prefers to defer PCSK9 inhibitor.   She is tolerating anticoagulation without any concerns.  Remaining active at baseline with her dog.  No symptoms of lifestyle limiting claudication or nonhealing wounds of the lower extremities.   Labs independently reviewed: 01/2023 - TSH normal, Hgb 14.7, PLT 252, TC 162, TG 85, HDL 63, LDL 83, albumin 4.4, AST/ALT normal, A1c 5.7, BUN 13, serum creatinine 0.87, potassium 3.6  Past Medical History:  Diagnosis Date   Allergy    Aortic valve sclerosis    a. TTE 2013: EF 60-65%, no RWMA, Gr1DD, trivial MR, mildly dilated LA 43 mm; b. TTE 08/2017: EF 65-70%, mild LVH, aortic valve sclerosis without stenosis, mildly dilated LA 36 mm, RV size and sys fxn nl   Arthropathy, unspecified, site unspecified    Basal cell carcinoma 06/15/2018   upper back right of midline/superficial   Cataract    Chronic rhinitis    Disorder of bone and cartilage, unspecified    Diverticulosis of colon (without mention of hemorrhage)    Essential hypertension    a. refractory HTN; b. renal US 2013 negative for RAS   External hemorrhoids without mention of complication    Medication intolerance    Obesity, unspecified    Other abnormal glucose    Panic disorder without agoraphobia    Peripheral arterial disease ([place])    a.  Known bilateral common iliac disease-> medical therapy-asymptomatic.   Persistent atrial fibrillation ([place])    a. diagnosed 10/19/17; b. CHADS2VASc 5 (HTN, age x 2, vascular disease, female)-->Eliquis   Pure hypercholesterolemia    Tobacco use disorder  Unspecified vitamin D deficiency     Past Surgical History:  Procedure Laterality Date   [place]   with open wound   EYE SURGERY  2011   CATARACTS -  BOTH   Removed spot on face     [place]   fibroid and endometriosis; one ovary remaining    Current Medications: Current Meds  Medication Sig   albuterol (VENTOLIN HFA) 108  (90 Base) MCG/ACT inhaler INHALE 2 PUFFS INTO THE LUNGS EVERY 6 HOURS AS NEEDED FOR WHEEZING OR SHORTNESS OF BREATH   apixaban (ELIQUIS) 5 MG TABS tablet TAKE 1 TABLET(5 MG) BY MOUTH TWICE DAILY   diltiazem (TIAZAC) 360 MG 24 hr capsule Take 1 capsule (360 mg total) by mouth daily.   enalapril (VASOTEC) 20 MG tablet TAKE 1 TABLET BY MOUTH EVERY MORNING AND 2 TABLETS EVERY AFTERNOON.   FLUoxetine (PROZAC) 20 MG capsule Take 1 capsule (20 mg total) by mouth daily.   fluticasone (FLONASE) 50 MCG/ACT nasal spray SHAKE LIQUID AND USE 2 SPRAYS IN EACH NOSTRIL DAILY   Glycopyrrolate-Formoterol (BEVESPI AEROSPHERE) 9-4.8 MCG/ACT AERO Inhale 2 puffs into the lungs in the morning and at bedtime. Complete supply of Spiriva, then switch to Bevespi when received from patient assistance   Multiple Vitamins-Minerals (PRESERVISION AREDS) CAPS Take 1 capsule by mouth daily.   mupirocin ointment (BACTROBAN) 2 % Apply 1 Application topically 2 (two) times daily.   pravastatin (PRAVACHOL) 10 MG tablet TAKE 1 TABLET(10 MG) BY MOUTH DAILY   Probiotic Product (PROBIOTIC DAILY PO) Take 1 tablet by mouth.   triamterene-hydrochlorothiazide (MAXZIDE-25) 37.5-25 MG tablet TAKE 1 TABLET BY MOUTH DAILY   VITAMIN D, CHOLECALCIFEROL, PO Take by mouth daily.    Allergies:   Amlodipine, Codeine, Coreg [carvedilol], Crestor [rosuvastatin], Metoprolol, Statins, and Nickel   Social History   Socioeconomic History   Marital status: Widowed    Spouse name: Not on file   Number of children: 0   Years of education: college   Highest education level: Not on file  Occupational History   Occupation: Estate manager/land agent    Comment: [employer] x 20 years  Tobacco Use   Smoking status: Every Day    Packs/day: 0.50    Years: 50.00    Additional pack years: 0.00    Total pack years: 25.00    Types: Cigarettes   Smokeless tobacco: Never   Tobacco comments:    10 a day  Vaping Use   Vaping Use: Never used  Substance and Sexual  Activity   Alcohol use: No    Alcohol/week: 0.0 standard drinks of alcohol   Drug use: No   Sexual activity: Never  Other Topics Concern   Not on file  Social History Narrative   Lives alone. Widowed after 23 years. Not dating. Has dog. Exercise: Walking; light has not been walking as she used to before her 2 dogs passed away. Tries to walk some. Caffeine: Carbonated beverages, 3 servings/day;Diet coke. 3 a day.      10/02/12  PER PATIENT'S LAVENDER FORM: WALK 5 DAYS/WEEK FOR 30 MINUTES   Social Determinants of Health   Financial Resource Strain: Medium Risk (12/31/2021)   Overall Financial Resource Strain (CARDIA)    Difficulty of Paying Living Expenses: Somewhat hard  Food Insecurity: Not on file  Transportation Needs: Not on file  Physical Activity: Not on file  Stress: Not on file  Social Connections: Not on file     Family History:  The patient's family history includes Aneurysm in her mother; Cancer in her brother; Heart disease in her brother and father; Hyperlipidemia in her brother; Hypertension in her brother and sister; Rheumatic fever in her mother.  ROS:   12-point review of systems is negative unless otherwise noted in the HPI.   EKGs/Labs/Other Studies Reviewed:    Studies reviewed were summarized above. The additional studies were reviewed today:  2D echo 08/2017: - Left ventricle: The cavity size was normal. Wall thickness was   increased in a pattern of mild LVH. Systolic function was   vigorous. The estimated ejection fraction was in the range of 65%   to 70%. - Aortic valve: Trileaflet; mildly thickened leaflets. Sclerosis   without stenosis. - Mitral valve: Mildly thickened leaflets . - Left atrium: The atrium was mildly dilated. - Right ventricle: The cavity size was normal. Systolic function   was normal.   EKG:  EKG is ordered today.  The EKG ordered today demonstrates A-fib, 90 bpm, low voltage QRS, baseline artifact, nonspecific ST-T  changes  Recent Labs: 02/02/2023: ALT 15; BUN 13; Creatinine, Ser 0.87; Hemoglobin 14.7; Platelets 252; Potassium 3.6; Sodium 139; TSH 2.710  Recent Lipid Panel    Component Value Date/Time   CHOL 162 02/02/2023 0836   TRIG 85 02/02/2023 0836   HDL 63 02/02/2023 0836   CHOLHDL 2.6 02/02/2023 0836   LDLCALC 83 02/02/2023 0836    PHYSICAL EXAM:    VS:  BP (!) 140/87 (BP Location: Left Arm, Patient Position: Sitting, Cuff Size: Large)   Pulse 90   Ht 5\' 4"  (1.626 m)   Wt 226 lb 9.6 oz (102.8 kg)   SpO2 95%   BMI 38.90 kg/m   BMI: Body mass index is 38.9 kg/m.  Physical Exam Vitals reviewed.  Constitutional:      Appearance: She is well-developed.  HENT:     Head: Normocephalic and atraumatic.  Eyes:     General:        Right eye: No discharge.        Left eye: No discharge.  Neck:     Vascular: No JVD.  Cardiovascular:     Rate and Rhythm: Normal rate. Rhythm irregularly irregular.     Pulses:          Dorsalis pedis pulses are 2+ on the right side and 2+ on the left side.       Posterior tibial pulses are 2+ on the right side and 2+ on the left side.     Heart sounds: S1 normal and S2 normal. Heart sounds not distant. No midsystolic click and no opening snap. Murmur heard.     Systolic murmur is present with a grade of 1/6 at the upper right sternal border.     No friction rub.  Pulmonary:     Effort: Pulmonary effort is normal. No respiratory distress.     Breath sounds: Normal breath sounds. No decreased breath sounds, wheezing or rales.  Chest:     Chest wall: No tenderness.  Abdominal:     General: There is no distension.  Musculoskeletal:     Cervical back: Normal range of motion.     Right lower leg: No edema.     Left lower leg: No edema.  Skin:    General: Skin is warm and dry.     Nails: There is no clubbing.  Neurological:  Mental Status: She is alert and oriented to person, place, and time.  Psychiatric:        Speech: Speech normal.         Behavior: Behavior normal.        Thought Content: Thought content normal.        Judgment: Judgment normal.     Wt Readings from Last 3 Encounters:  02/25/23 226 lb 9.6 oz (102.8 kg)  02/08/23 221 lb (100.2 kg)  12/08/22 220 lb (99.8 kg)     ASSESSMENT & PLAN:   Permanent A-fib: She remains in rate controlled A-fib and is asymptomatic.  Continue rate control with 360 mg daily.  CHA2DS2-VASc at least 5 (HTN, age x 2, vascular disease, sex category).  Recent labs showed stable renal function, electrolytes, and hemoglobin.  PAD: No symptoms concerning for critical limb ischemia or lifestyle limiting claudication.  Continue walking regimen.  She remains on apixaban in place of aspirin to minimize bleeding risk.  Aortic sclerosis: No evidence of stenosis on echo.  Stable exam.  HTN: Blood pressure elevated at triage at 140/87.  Blood pressure has been well-controlled at outside offices.  She was stressed coming into the office this morning.  Continue current medical therapy.  HLD: LDL 83 in 01/2023 with normal AST/ALT at that time.  Intolerant to high intensity statins and ezetimibe.  She remains on pravastatin and is working on a heart healthy diet.  Prefers to avoid PCSK9 inhibitor.    Disposition: F/u with Dr. Fletcher Anon or an APP in 12 months.   Medication Adjustments/Labs and Tests Ordered: Current medicines are reviewed at length with the patient today.  Concerns regarding medicines are outlined above. Medication changes, Labs and Tests ordered today are summarized above and listed in the Patient Instructions accessible in Encounters.   Melvern Banker, PA-C 02/25/2023 9:07 AM     [address], [place] [address] [phone]

## 2023-02-25 ENCOUNTER — Ambulatory Visit: Payer: PPO | Attending: Physician Assistant | Admitting: Physician Assistant

## 2023-02-25 ENCOUNTER — Encounter: Payer: Self-pay | Admitting: Physician Assistant

## 2023-02-25 VITALS — BP 140/87 | HR 90 | Ht 64.0 in | Wt 226.6 lb

## 2023-02-25 DIAGNOSIS — I358 Other nonrheumatic aortic valve disorders: Secondary | ICD-10-CM

## 2023-02-25 DIAGNOSIS — E785 Hyperlipidemia, unspecified: Secondary | ICD-10-CM

## 2023-02-25 DIAGNOSIS — I10 Essential (primary) hypertension: Secondary | ICD-10-CM

## 2023-02-25 DIAGNOSIS — I739 Peripheral vascular disease, unspecified: Secondary | ICD-10-CM | POA: Diagnosis not present

## 2023-02-25 DIAGNOSIS — I4821 Permanent atrial fibrillation: Secondary | ICD-10-CM

## 2023-02-25 NOTE — Patient Instructions (Signed)
Medication Instructions:  Your physician recommends that you continue on your current medications as directed. Please refer to the Current Medication list given to you today.  *If you need a refill on your cardiac medications before your next appointment, please call your pharmacy*   Lab Work: No labs ordered  If you have labs (blood work) drawn today and your tests are completely normal, you will receive your results only by: [place] (if you have MyChart) OR A paper copy in the mail If you have any lab test that is abnormal or we need to change your treatment, we will call you to review the results.   Testing/Procedures: No testing ordered  Follow-Up: At [facility], you and your health needs are our priority.  As part of our continuing mission to provide you with exceptional heart care, we have created designated Provider Care Teams.  These Care Teams include your primary Cardiologist (physician) and Advanced Practice Providers (APPs -  Physician Assistants and Nurse Practitioners) who all work together to provide you with the care you need, when you need it.  We recommend signing up for the patient portal called "MyChart".  Sign up information is provided on this After Visit Summary.  MyChart is used to connect with patients for Virtual Visits (Telemedicine).  Patients are able to view lab/test results, encounter notes, upcoming appointments, etc.  Non-urgent messages can be sent to your provider as well.   To learn more about what you can do with MyChart, go to [email or web address].    Your next appointment:   1 year(s)  Provider:   You may see Kathlyn Sacramento, MD or one of the following Advanced Practice Providers on your designated Care Team:   Murray Hodgkins, NP Christell Faith, PA-C Cadence Kathlen Mody, PA-C Gerrie Nordmann, NP

## 2023-03-03 DIAGNOSIS — H903 Sensorineural hearing loss, bilateral: Secondary | ICD-10-CM | POA: Diagnosis not present

## 2023-03-15 ENCOUNTER — Telehealth: Payer: Self-pay | Admitting: Cardiovascular Disease

## 2023-03-15 MED ORDER — TRIAMTERENE-HCTZ 37.5-25 MG PO TABS: 1.0000 | ORAL_TABLET | Freq: Every day | ORAL | 2 refills | Status: DC

## 2023-03-15 NOTE — Telephone Encounter (Signed)
*  STAT* If patient is at the pharmacy, call can be transferred to refill team.   1. Which medications need to be refilled? (please list name of each medication and dose if known)   triamterene-hydrochlorothiazide (MAXZIDE-25) 37.5-25 MG tablet   2. Which pharmacy/location (including street and city if local pharmacy) is medication to be sent to?  [id number] - [place], [place] - [address]   3. Do they need a 30 day or 90 day supply?   90 day  Patient stated she still has some of this medication.

## 2023-03-15 NOTE — Telephone Encounter (Signed)
Requested Prescriptions   Signed Prescriptions Disp Refills   triamterene-hydrochlorothiazide (MAXZIDE-25) 37.5-25 MG tablet 90 tablet 2    Sig: Take 1 tablet by mouth daily.    Authorizing Provider: Lorine Bears A    Ordering User: Thayer Headings, Kaleea Penner L

## 2023-03-18 DIAGNOSIS — H903 Sensorineural hearing loss, bilateral: Secondary | ICD-10-CM | POA: Diagnosis not present

## 2023-04-05 ENCOUNTER — Telehealth: Payer: PPO | Admitting: Physician Assistant

## 2023-04-05 DIAGNOSIS — J3489 Other specified disorders of nose and nasal sinuses: Secondary | ICD-10-CM | POA: Diagnosis not present

## 2023-04-05 DIAGNOSIS — B9689 Other specified bacterial agents as the cause of diseases classified elsewhere: Secondary | ICD-10-CM | POA: Diagnosis not present

## 2023-04-05 DIAGNOSIS — J019 Acute sinusitis, unspecified: Secondary | ICD-10-CM

## 2023-04-05 MED ORDER — DOXYCYCLINE HYCLATE 100 MG PO TABS
100.00 mg | ORAL_TABLET | Freq: Two times a day (BID) | ORAL | 0 refills | Status: DC
Start: 2023-04-05 — End: 2023-06-14

## 2023-04-05 MED ORDER — MUPIROCIN 2 % EX OINT
1.0000 | TOPICAL_OINTMENT | Freq: Two times a day (BID) | CUTANEOUS | 0 refills | Status: DC
Start: 2023-04-05 — End: 2024-07-02

## 2023-04-05 NOTE — Progress Notes (Signed)

## 2023-04-12 ENCOUNTER — Telehealth: Payer: Self-pay | Admitting: Cardiovascular Disease

## 2023-04-12 ENCOUNTER — Other Ambulatory Visit: Payer: Self-pay | Admitting: Cardiovascular Disease

## 2023-04-12 MED ORDER — ENALAPRIL MALEATE 20 MG PO TABS: ORAL_TABLET | ORAL | 1 refills | Status: DC

## 2023-04-12 NOTE — Telephone Encounter (Signed)
*  STAT* If patient is at the pharmacy, call can be transferred to refill team.   1. Which medications need to be refilled? (please list name of each medication and dose if known) enalapril (VASOTEC) 20 MG tablet   2. Which pharmacy/location (including street and city if local pharmacy) is medication to be sent to? [id number] - [place], [place] - [address]   3. Do they need a 30 day or 90 day supply? 90 day  Pt is completely out of medication

## 2023-04-12 NOTE — Telephone Encounter (Signed)
Requested Prescriptions   Signed Prescriptions Disp Refills   enalapril (VASOTEC) 20 MG tablet 270 tablet 1    Sig: Take 1 tablet by mouth in the morning and 2 tablets in the afternoon.    Authorizing Provider: Lorine Bears A    Ordering User: Thayer Headings, Shona Pardo L

## 2023-04-12 NOTE — Telephone Encounter (Signed)
Please see note from pharmacy. Pharmacy requesting verification due to max daily recommended dose.

## 2023-04-14 NOTE — Telephone Encounter (Signed)
Pharmacy has been made aware that this dose has not changed. She will refill it.

## 2023-04-22 DIAGNOSIS — H353211 Exudative age-related macular degeneration, right eye, with active choroidal neovascularization: Secondary | ICD-10-CM | POA: Diagnosis not present

## 2023-04-29 ENCOUNTER — Telehealth: Payer: Self-pay | Admitting: Internal Medicine

## 2023-04-29 NOTE — Telephone Encounter (Signed)
Please call and confirm she is doing ok.  Agree with need for evaluation if persistent symptoms.

## 2023-04-29 NOTE — Telephone Encounter (Signed)
Pt called in staying that she had a e-visit appt on 04/05/23, and they prescribed her antibiotics, however, as per pt, she still have the same symptoms plus fever, and she was wondering if Dr. Lorin Picket wants to sent some meds over at her pharmacy? I offered appt for Tuesday, she said she just want a different antibiotics called in.

## 2023-04-29 NOTE — Telephone Encounter (Signed)
Pt was advised that she would need an appt to have a different antibiotic prescribed. I advised her that if her symptoms are not getting better or they are getting worse then she would need to be evaluated in person at an urgent care. Pt gave a verbal understanding.

## 2023-05-03 NOTE — Telephone Encounter (Signed)
Patient aware of below and says that she felt good yesterday and so far no issues today. Will call to get appt if symptoms return

## 2023-05-03 NOTE — Telephone Encounter (Signed)
LMTCB

## 2023-06-06 ENCOUNTER — Encounter: Payer: Self-pay | Admitting: Internal Medicine

## 2023-06-10 DIAGNOSIS — I10 Essential (primary) hypertension: Secondary | ICD-10-CM | POA: Diagnosis not present

## 2023-06-10 DIAGNOSIS — E7800 Pure hypercholesterolemia, unspecified: Secondary | ICD-10-CM | POA: Diagnosis not present

## 2023-06-10 DIAGNOSIS — R739 Hyperglycemia, unspecified: Secondary | ICD-10-CM | POA: Diagnosis not present

## 2023-06-11 LAB — BASIC METABOLIC PANEL
BUN/Creatinine Ratio: 14 (ref 12–28)
BUN: 13 mg/dL (ref 8–27)
CO2: 25 mmol/L (ref 20–29)
Calcium: 9.4 mg/dL (ref 8.7–10.3)
Chloride: 95 mmol/L — ABNORMAL LOW (ref 96–106)
Creatinine, Ser: 0.92 mg/dL (ref 0.57–1.00)
Glucose: 101 mg/dL — ABNORMAL HIGH (ref 70–99)
Potassium: 3.8 mmol/L (ref 3.5–5.2)
Sodium: 136 mmol/L (ref 134–144)
eGFR: 63 mL/min/{1.73_m2} (ref >59)

## 2023-06-11 LAB — LIPID PANEL
Chol/HDL Ratio: 2.4 ratio (ref 0.0–4.4)
Cholesterol, Total: 160 mg/dL (ref 100–199)
HDL: 66 mg/dL (ref >39)
LDL Chol Calc (NIH): 79 mg/dL (ref 0–99)
Triglycerides: 78 mg/dL (ref 0–149)
VLDL Cholesterol Cal: 15 mg/dL (ref 5–40)

## 2023-06-11 LAB — HEPATIC FUNCTION PANEL
ALT: 11 IU/L (ref 0–32)
AST: 18 IU/L (ref 0–40)
Albumin: 4.2 g/dL (ref 3.7–4.7)
Alkaline Phosphatase: 61 IU/L (ref 44–121)
Bilirubin Total: 1.5 mg/dL — ABNORMAL HIGH (ref 0.0–1.2)
Bilirubin, Direct: 0.38 mg/dL (ref 0.00–0.40)
Total Protein: 6.7 g/dL (ref 6.0–8.5)

## 2023-06-11 LAB — HEMOGLOBIN A1C
Est. average glucose Bld gHb Est-mCnc: 117 mg/dL
Hgb A1c MFr Bld: 5.7 % — ABNORMAL HIGH (ref 4.8–5.6)

## 2023-06-14 ENCOUNTER — Ambulatory Visit (INDEPENDENT_AMBULATORY_CARE_PROVIDER_SITE_OTHER): Payer: PPO | Admitting: Internal Medicine

## 2023-06-14 ENCOUNTER — Encounter: Payer: Self-pay | Admitting: Internal Medicine

## 2023-06-14 VITALS — BP 116/74 | HR 87 | Temp 97.9°F | Resp 16 | Ht 64.0 in | Wt 219.2 lb

## 2023-06-14 DIAGNOSIS — I739 Peripheral vascular disease, unspecified: Secondary | ICD-10-CM | POA: Diagnosis not present

## 2023-06-14 DIAGNOSIS — I700 Atherosclerosis of aorta: Secondary | ICD-10-CM | POA: Diagnosis not present

## 2023-06-14 DIAGNOSIS — I10 Essential (primary) hypertension: Secondary | ICD-10-CM

## 2023-06-14 DIAGNOSIS — R739 Hyperglycemia, unspecified: Secondary | ICD-10-CM | POA: Diagnosis not present

## 2023-06-14 DIAGNOSIS — F419 Anxiety disorder, unspecified: Secondary | ICD-10-CM

## 2023-06-14 DIAGNOSIS — J3489 Other specified disorders of nose and nasal sinuses: Secondary | ICD-10-CM | POA: Diagnosis not present

## 2023-06-14 DIAGNOSIS — J449 Chronic obstructive pulmonary disease, unspecified: Secondary | ICD-10-CM

## 2023-06-14 DIAGNOSIS — I4821 Permanent atrial fibrillation: Secondary | ICD-10-CM

## 2023-06-14 DIAGNOSIS — Z720 Tobacco use: Secondary | ICD-10-CM

## 2023-06-14 DIAGNOSIS — E7800 Pure hypercholesterolemia, unspecified: Secondary | ICD-10-CM | POA: Diagnosis not present

## 2023-06-14 DIAGNOSIS — Z0000 Encounter for general adult medical examination without abnormal findings: Secondary | ICD-10-CM

## 2023-06-14 DIAGNOSIS — D6869 Other thrombophilia: Secondary | ICD-10-CM

## 2023-06-14 MED ORDER — TIOTROPIUM BROMIDE MONOHYDRATE 18 MCG IN CAPS: 18.0000 ug | ORAL_CAPSULE | Freq: Every day | RESPIRATORY_TRACT | 12 refills | Status: DC

## 2023-06-14 MED ORDER — FLUOXETINE HCL 20 MG PO CAPS: 20.0000 mg | ORAL_CAPSULE | Freq: Every day | ORAL | 1 refills | Status: DC

## 2023-06-14 NOTE — Progress Notes (Signed)
Subjective:    Patient ID: Danielle Yates, female    DOB: [birth year], 82 y.o.   MRN: [id number]  Patient here for  Chief Complaint  Patient presents with   Medical Management of Chronic Issues    HPI Here for physical and f/u regarding her blood pressure, blood sugar, afib and hypercholesterolemia. Also with documented COPD. Continues on eliquis. She has previously not tolerated metoprolol due to bradycardia and fatigue. She has not tolerated amlodipine or Coreg secondary to headache and dizziness. She is also known to be intolerant to high-intensity statins, tolerating pravastatin. F/u with cardiology 02/2023- stable.  Recommended f/u in one year. Breathing stable.  Needs to change back to spiriva.  No increased cough.  Persistent lesion - right nares despite treatment with bactroban. No nausea or vomiting.  No abdominal pain.  Previous diarrhea after eating deviled eggs.  Resolved.    Past Medical History:  Diagnosis Date   Allergy    Aortic valve sclerosis    a. TTE 2013: EF 60-65%, no RWMA, Gr1DD, trivial MR, mildly dilated LA 43 mm; b. TTE 08/2017: EF 65-70%, mild LVH, aortic valve sclerosis without stenosis, mildly dilated LA 36 mm, RV size and sys fxn nl   Arthropathy, unspecified, site unspecified    Basal cell carcinoma 06/15/2018   upper back right of midline/superficial   Cataract    Chronic rhinitis    Disorder of bone and cartilage, unspecified    Diverticulosis of colon (without mention of hemorrhage)    Essential hypertension    a. refractory HTN; b. renal US 2013 negative for RAS   External hemorrhoids without mention of complication    Medication intolerance    Obesity, unspecified    Other abnormal glucose    Panic disorder without agoraphobia    Peripheral arterial disease (HCC)    a.  Known bilateral common iliac disease-> medical therapy-asymptomatic.   Persistent atrial fibrillation (HCC)    a. diagnosed 10/19/17; b. CHADS2VASc 5 (HTN, age x 2, vascular  disease, female)-->Eliquis   Pure hypercholesterolemia    Tobacco use disorder    Unspecified vitamin D deficiency    Past Surgical History:  Procedure Laterality Date   APPENDECTOMY  1985   CHOLECYSTECTOMY  1985   with open wound   EYE SURGERY  2011   CATARACTS -  BOTH   Removed spot on face     [name]   TONSILLECTOMY AND ADENOIDECTOMY  1946   VAGINAL HYSTERECTOMY  1982   fibroid and endometriosis; one ovary remaining   Family History  Problem Relation Age of Onset   Hypertension Sister    Heart disease Brother    Hypertension Brother    Hyperlipidemia Brother    Cancer Brother    Aneurysm Mother    Rheumatic fever Mother        as an adult   Heart disease Father        MI x 2   Social History   Socioeconomic History   Marital status: Widowed    Spouse name: Not on file   Number of children: 0   Years of education: college   Highest education level: Not on file  Occupational History   Occupation: Academic librarian    Comment: [employer] x 20 years  Tobacco Use   Smoking status: Every Day    Current packs/day: 0.50    Average packs/day: 0.5 packs/day for 50.0 years (25.0 ttl pk-yrs)    Types: Cigarettes   Smokeless tobacco: Never  Tobacco comments:    10 a day  Vaping Use   Vaping status: Never Used  Substance and Sexual Activity   Alcohol use: No    Alcohol/week: 0.0 standard drinks of alcohol   Drug use: No   Sexual activity: Never  Other Topics Concern   Not on file  Social History Narrative   Lives alone. Widowed after 23 years. Not dating. Has dog. Exercise: Walking; light has not been walking as she used to before her 2 dogs passed away. Tries to walk some. Caffeine: Carbonated beverages, 3 servings/day;Diet coke. 3 a day.      10/02/12  PER PATIENT'S LAVENDER FORM: WALK 5 DAYS/WEEK FOR 30 MINUTES   Social Determinants of Health   Financial Resource Strain: Medium Risk (12/31/2021)   Overall Financial Resource Strain (CARDIA)    Difficulty of  Paying Living Expenses: Somewhat hard  Food Insecurity: Not on file  Transportation Needs: Not on file  Physical Activity: Not on file  Stress: Not on file  Social Connections: Not on file     Review of Systems  Constitutional:  Negative for appetite change and unexpected weight change.  HENT:  Negative for congestion, sinus pressure and sore throat.   Eyes:  Negative for pain and visual disturbance.  Respiratory:  Negative for cough, chest tightness and shortness of breath.   Cardiovascular:  Negative for chest pain and palpitations.  Gastrointestinal:  Negative for abdominal pain, nausea and vomiting.  Genitourinary:  Negative for difficulty urinating and dysuria.  Musculoskeletal:  Negative for joint swelling and myalgias.  Skin:  Negative for color change and rash.  Neurological:  Negative for dizziness and headaches.  Hematological:  Negative for adenopathy. Does not bruise/bleed easily.  Psychiatric/Behavioral:  Negative for decreased concentration and dysphoric mood.        Objective:     BP 116/74   Pulse 87   Temp 97.9 F (36.6 C)   Resp 16   Ht 5\' 4"  (1.626 m)   Wt 219 lb 3.2 oz (99.4 kg)   SpO2 97%   BMI 37.63 kg/m  Wt Readings from Last 3 Encounters:  06/14/23 219 lb 3.2 oz (99.4 kg)  02/25/23 226 lb 9.6 oz (102.8 kg)  02/08/23 221 lb (100.2 kg)    Physical Exam Vitals reviewed.  Constitutional:      General: She is not in acute distress.    Appearance: Normal appearance. She is well-developed.  HENT:     Head: Normocephalic and atraumatic.     Right Ear: External ear normal.     Left Ear: External ear normal.  Eyes:     General: No scleral icterus.       Right eye: No discharge.        Left eye: No discharge.     Conjunctiva/sclera: Conjunctivae normal.  Neck:     Thyroid: No thyromegaly.  Cardiovascular:     Rate and Rhythm: Normal rate and regular rhythm.  Pulmonary:     Effort: No tachypnea, accessory muscle usage or respiratory distress.      Breath sounds: Normal breath sounds. No decreased breath sounds or wheezing.  Chest:  Breasts:    Right: No inverted nipple, mass, nipple discharge or tenderness (no axillary adenopathy).     Left: No inverted nipple, mass, nipple discharge or tenderness (no axilarry adenopathy).  Abdominal:     General: Bowel sounds are normal.     Palpations: Abdomen is soft.     Tenderness: There is no  abdominal tenderness.  Musculoskeletal:        General: No swelling or tenderness.     Cervical back: Neck supple.  Lymphadenopathy:     Cervical: No cervical adenopathy.  Skin:    Findings: No erythema or rash.  Neurological:     Mental Status: She is alert and oriented to person, place, and time.  Psychiatric:        Mood and Affect: Mood normal.        Behavior: Behavior normal.      Outpatient Encounter Medications as of 06/14/2023  Medication Sig   tiotropium (SPIRIVA HANDIHALER) 18 MCG inhalation capsule Place 1 capsule (18 mcg total) into inhaler and inhale daily.   albuterol (VENTOLIN HFA) 108 (90 Base) MCG/ACT inhaler INHALE 2 PUFFS INTO THE LUNGS EVERY 6 HOURS AS NEEDED FOR WHEEZING OR SHORTNESS OF BREATH   apixaban (ELIQUIS) 5 MG TABS tablet TAKE 1 TABLET(5 MG) BY MOUTH TWICE DAILY   diltiazem (TIAZAC) 360 MG 24 hr capsule Take 1 capsule (360 mg total) by mouth daily.   enalapril (VASOTEC) 20 MG tablet Take 1 tablet by mouth in the morning and 2 tablets in the afternoon.   FLUoxetine (PROZAC) 20 MG capsule Take 1 capsule (20 mg total) by mouth daily.   fluticasone (FLONASE) 50 MCG/ACT nasal spray SHAKE LIQUID AND USE 2 SPRAYS IN EACH NOSTRIL DAILY   Multiple Vitamins-Minerals (PRESERVISION AREDS) CAPS Take 1 capsule by mouth daily.   mupirocin ointment (BACTROBAN) 2 % Apply 1 Application topically 2 (two) times daily.   pravastatin (PRAVACHOL) 10 MG tablet TAKE 1 TABLET(10 MG) BY MOUTH DAILY   Probiotic Product (PROBIOTIC DAILY PO) Take 1 tablet by mouth.    triamterene-hydrochlorothiazide (MAXZIDE-25) 37.5-25 MG tablet Take 1 tablet by mouth daily.   VITAMIN D, CHOLECALCIFEROL, PO Take by mouth daily.   [DISCONTINUED] doxycycline (VIBRA-TABS) 100 MG tablet Take 1 tablet (100 mg total) by mouth 2 (two) times daily.   [DISCONTINUED] FLUoxetine (PROZAC) 20 MG capsule Take 1 capsule (20 mg total) by mouth daily.   [DISCONTINUED] Glycopyrrolate-Formoterol (BEVESPI AEROSPHERE) 9-4.8 MCG/ACT AERO Inhale 2 puffs into the lungs in the morning and at bedtime. Complete supply of Spiriva, then switch to [facility] when received from patient assistance   No facility-administered encounter medications on file as of 06/14/2023.     Lab Results  Component Value Date   WBC 6.8 02/02/2023   HGB 14.7 02/02/2023   HCT 45.3 02/02/2023   PLT 252 02/02/2023   GLUCOSE 101 (H) 06/10/2023   CHOL 160 06/10/2023   TRIG 78 06/10/2023   HDL 66 06/10/2023   LDLCALC 79 06/10/2023   ALT 11 06/10/2023   AST 18 06/10/2023   NA 136 06/10/2023   K 3.8 06/10/2023   CL 95 (L) 06/10/2023   CREATININE 0.92 06/10/2023   BUN 13 06/10/2023   CO2 25 06/10/2023   TSH 2.710 02/02/2023   HGBA1C 5.7 (H) 06/10/2023       Assessment & Plan:  Routine general medical examination at a health care facility  Hyperbilirubinemia -     Hepatic function panel  Hyperglycemia Assessment & Plan: Low carb diet and exercise.  Follow met b and a1c.   Orders: -     Hemoglobin A1c  Pure hypercholesterolemia Assessment & Plan: Pravastatin.  Follow lipid panel and liver function tests.  Low cholesterol diet and exercise.  Intolerance to other statin medication. Intolerance to zetia.   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  Afib.    Anxiety Assessment & Plan: Has done well on prozac.  Follow.    Aortic atherosclerosis (HCC) Assessment & Plan: Continue  pravastatin   Permanent atrial fibrillation (HCC) Assessment & Plan: No increased heart rate or palpitations.  Continue eliquis.  On diltiazem.  Follow.     Chronic obstructive pulmonary disease, unspecified COPD type (HCC) Assessment & Plan: Has been followed by pulmonary.  Breathing overall stable. Continue current inhaler regimen - changing to spiriva. Not requiring increased use of rescue inhaler.    Essential hypertension, benign Assessment & Plan: Continue diltiazem and enalapril.  Follow pressures.    Health care maintenance Assessment & Plan: Physical today 06/14/23.  Mammogram 09/09/22 - Birads I.  Colonoscopy 2012.  Overdue.  Notify when agreeable.    Internal nasal lesion Assessment & Plan: Persistent despite treatment with bactroban.  She has stopped flonase.  Refer to ENT for evaluation.   Orders: -     Ambulatory referral to ENT  PAD (peripheral artery disease) (HCC) Assessment & Plan: Affecting common iliac arteries mainly.  Continue risk factor modification.  Continue pravastatin.    Tobacco abuse Assessment & Plan: Discussed recommendation to quit.  Follow.    Other orders -     FLUoxetine HCl; Take 1 capsule (20 mg total) by mouth daily.  Dispense: 90 capsule; Refill: 1 -     Tiotropium Bromide Monohydrate; Place 1 capsule (18 mcg total) into inhaler and inhale daily.  Dispense: 30 capsule; Refill: 12     Dale Sebewaing, MD

## 2023-06-18 ENCOUNTER — Encounter: Payer: Self-pay | Admitting: Internal Medicine

## 2023-06-18 NOTE — Assessment & Plan Note (Signed)
Continue diltiazem and enalapril.  Follow pressures.  

## 2023-06-18 NOTE — Assessment & Plan Note (Signed)
On eliquis.  Afib.  ?

## 2023-06-18 NOTE — Assessment & Plan Note (Signed)
Affecting common iliac arteries mainly.  Continue risk factor modification.  Continue pravastatin.  

## 2023-06-18 NOTE — Assessment & Plan Note (Signed)
Continue pravastatin 

## 2023-06-18 NOTE — Assessment & Plan Note (Signed)
Persistent despite treatment with bactroban.  She has stopped flonase.  Refer to ENT for evaluation.

## 2023-06-18 NOTE — Assessment & Plan Note (Signed)
Discussed recommendation to quit.  Follow.  

## 2023-06-18 NOTE — Assessment & Plan Note (Signed)
Has done well on prozac.  Follow.  

## 2023-06-18 NOTE — Assessment & Plan Note (Signed)
Has been followed by pulmonary.  Breathing overall stable. Continue current inhaler regimen - changing to spiriva. Not requiring increased use of rescue inhaler.

## 2023-06-18 NOTE — Assessment & Plan Note (Signed)
Pravastatin.  Follow lipid panel and liver function tests.  Low cholesterol diet and exercise.  Intolerance to other statin medication. Intolerance to zetia.  

## 2023-06-18 NOTE — Assessment & Plan Note (Signed)
Physical today 06/14/23.  Mammogram 09/09/22 - Birads I.  Colonoscopy 2012.  Overdue.  Notify when agreeable.

## 2023-06-18 NOTE — Assessment & Plan Note (Signed)
Low carb diet and exercise.  Follow met b and a1c.   

## 2023-06-18 NOTE — Assessment & Plan Note (Signed)
No increased heart rate or palpitations.  Continue eliquis.  On diltiazem.  Follow.   

## 2023-06-22 DIAGNOSIS — J3489 Other specified disorders of nose and nasal sinuses: Secondary | ICD-10-CM | POA: Diagnosis not present

## 2023-06-22 DIAGNOSIS — J301 Allergic rhinitis due to pollen: Secondary | ICD-10-CM | POA: Diagnosis not present

## 2023-06-23 ENCOUNTER — Encounter: Payer: Self-pay | Admitting: Internal Medicine

## 2023-06-28 ENCOUNTER — Ambulatory Visit: Payer: PPO | Admitting: Dermatology

## 2023-06-28 ENCOUNTER — Encounter: Payer: Self-pay | Admitting: Dermatology

## 2023-06-28 DIAGNOSIS — Z85828 Personal history of other malignant neoplasm of skin: Secondary | ICD-10-CM

## 2023-06-28 DIAGNOSIS — L720 Epidermal cyst: Secondary | ICD-10-CM | POA: Diagnosis not present

## 2023-06-28 DIAGNOSIS — D3610 Benign neoplasm of peripheral nerves and autonomic nervous system, unspecified: Secondary | ICD-10-CM

## 2023-06-28 DIAGNOSIS — D3612 Benign neoplasm of peripheral nerves and autonomic nervous system, upper limb, including shoulder: Secondary | ICD-10-CM

## 2023-06-28 NOTE — Progress Notes (Signed)
   Follow-Up Visit   Subjective  Danielle Yates is a 82 y.o. female who presents for the following: PCP recommended patient have spot at right scalp checked. Patient thinks it's been there for a few months. No symptoms with it.   Patient does have hx of BCC.   The following portions of the chart were reviewed this encounter and updated as appropriate: medications, allergies, medical history  Review of Systems:  No other skin or systemic complaints except as noted in HPI or Assessment and Plan.  Objective  Well appearing patient in no apparent distress; mood and affect are within normal limits.   A focused examination was performed of the following areas: Scalp, arms  Relevant exam findings are noted in the Assessment and Plan.    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 Asymptomatic per patient report - offered biopsy if patient wants confirmation of nature of lesions. Patient declines today Benign-appearing.  Observation.  Call clinic for new or changing lesions.  Recommend daily use of broad spectrum spf 30+ sunscreen to sun-exposed areas.    Return for TBSE, Hx BCC with Dr. Katrinka Blazing.  Anise Salvo, RMA, am acting as scribe for Elie Goody, MD .   Documentation: I have reviewed the above documentation for accuracy and completeness, and I agree with the above.  Elie Goody, MD

## 2023-06-28 NOTE — Patient Instructions (Signed)
Due to recent changes in healthcare laws, you may see results of your pathology and/or laboratory studies on MyChart before the doctors have had a chance to review them. We understand that in some cases there may be results that are confusing or concerning to you. Please understand that not all results are received at the same time and often the doctors may need to interpret multiple results in order to provide you with the best plan of care or course of treatment. Therefore, we ask that you please give us 2 business days to thoroughly review all your results before contacting the office for clarification. Should we see a critical lab result, you will be contacted sooner.   If You Need Anything After Your Visit  If you have any questions or concerns for your doctor, please call our main line at [phone] and press option 4 to reach your doctor's medical assistant. If no one answers, please leave a voicemail as directed and we will return your call as soon as possible. Messages left after 4 pm will be answered the following business day.   You may also send us a message via MyChart. We typically respond to MyChart messages within 1-2 business days.  For prescription refills, please ask your pharmacy to contact our office. Our fax number is [phone].  If you have an urgent issue when the clinic is closed that cannot wait until the next business day, you can page your doctor at the number below.    Please note that while we do our best to be available for urgent issues outside of office hours, we are not available 24/7.   If you have an urgent issue and are unable to reach us, you may choose to seek medical care at your doctor's office, retail clinic, urgent care center, or emergency room.  If you have a medical emergency, please immediately call 911 or go to the emergency department.  Pager Numbers  - Dr. Kowalski: [phone]  - Dr. Moye: [phone]  - Dr. Stewart:  [phone]  In the event of inclement weather, please call our main line at [phone] for an update on the status of any delays or closures.  Dermatology Medication Tips: Please keep the boxes that topical medications come in in order to help keep track of the instructions about where and how to use these. Pharmacies typically print the medication instructions only on the boxes and not directly on the medication tubes.   If your medication is too expensive, please contact our office at [phone] option 4 or send us a message through MyChart.   We are unable to tell what your co-pay for medications will be in advance as this is different depending on your insurance coverage. However, we may be able to find a substitute medication at lower cost or fill out paperwork to get insurance to cover a needed medication.   If a prior authorization is required to get your medication covered by your insurance company, please allow us 1-2 business days to complete this process.  Drug prices often vary depending on where the prescription is filled and some pharmacies may offer cheaper prices.  The website www.goodrx.com contains coupons for medications through different pharmacies. The prices here do not account for what the cost may be with help from insurance (it may be cheaper with your insurance), but the website can give you the price if you did not use any insurance.  - You can print the associated coupon and take it with   your prescription to the pharmacy.  - You may also stop by our office during regular business hours and pick up a GoodRx coupon card.  - If you need your prescription sent electronically to a different pharmacy, notify our office through [place] MyChart or by phone at [phone] option 4.     

## 2023-07-26 ENCOUNTER — Other Ambulatory Visit: Payer: Self-pay | Admitting: Cardiovascular Disease

## 2023-07-26 DIAGNOSIS — I4821 Permanent atrial fibrillation: Secondary | ICD-10-CM

## 2023-07-26 NOTE — Telephone Encounter (Signed)
Refill request

## 2023-07-26 NOTE — Telephone Encounter (Signed)
Prescription refill request for Eliquis received. Indication: AF Last office visit: 02/25/23  R Dunn PA-C Scr: 0.92 on 06/10/23  Epic Age: 82 Weight: 102.8kg  Based on above findings Eliquis 5mg  twice daily is the appropriate dose.  Refill approved.

## 2023-08-11 ENCOUNTER — Encounter: Payer: PPO | Admitting: Dermatology

## 2023-08-12 DIAGNOSIS — H353211 Exudative age-related macular degeneration, right eye, with active choroidal neovascularization: Secondary | ICD-10-CM | POA: Diagnosis not present

## 2023-08-23 ENCOUNTER — Encounter: Payer: Self-pay | Admitting: Dermatology

## 2023-08-23 ENCOUNTER — Ambulatory Visit: Payer: PPO | Admitting: Dermatology

## 2023-08-23 DIAGNOSIS — L821 Other seborrheic keratosis: Secondary | ICD-10-CM

## 2023-08-23 DIAGNOSIS — W908XXA Exposure to other nonionizing radiation, initial encounter: Secondary | ICD-10-CM | POA: Diagnosis not present

## 2023-08-23 DIAGNOSIS — L578 Other skin changes due to chronic exposure to nonionizing radiation: Secondary | ICD-10-CM | POA: Diagnosis not present

## 2023-08-23 DIAGNOSIS — L814 Other melanin hyperpigmentation: Secondary | ICD-10-CM

## 2023-08-23 DIAGNOSIS — Z1283 Encounter for screening for malignant neoplasm of skin: Secondary | ICD-10-CM

## 2023-08-23 DIAGNOSIS — Z85828 Personal history of other malignant neoplasm of skin: Secondary | ICD-10-CM

## 2023-08-23 DIAGNOSIS — D1801 Hemangioma of skin and subcutaneous tissue: Secondary | ICD-10-CM

## 2023-08-23 DIAGNOSIS — D229 Melanocytic nevi, unspecified: Secondary | ICD-10-CM

## 2023-08-23 NOTE — Progress Notes (Signed)
   Follow-Up Visit   Subjective  Danielle Yates is a 82 y.o. female who presents for the following: Skin Cancer Screening and Full Body Skin Exam  The patient presents for Total-Body Skin Exam (TBSE) for skin cancer screening and mole check. The patient has spots, moles and lesions to be evaluated, some may be new or changing and the patient may have concern these could be cancer.  Patient with hx of BCC.   The following portions of the chart were reviewed this encounter and updated as appropriate: medications, allergies, medical history  Review of Systems:  No other skin or systemic complaints except as noted in HPI or Assessment and Plan.  Objective  Well appearing patient in no apparent distress; mood and affect are within normal limits.  A full examination was performed including scalp, head, eyes, ears, nose, lips, neck, chest, axillae, abdomen, back, buttocks, bilateral upper extremities, bilateral lower extremities, hands, feet, fingers, toes, fingernails, and toenails. All findings within normal limits unless otherwise noted below.   Exam of nails limited by presence of nail polish. Exam of face limited by presence of make-up.  Relevant physical exam findings are noted in the Assessment and Plan.    Assessment & Plan   SKIN CANCER SCREENING PERFORMED TODAY.  ACTINIC DAMAGE - Chronic condition, secondary to cumulative UV/sun exposure - diffuse scaly erythematous macules with underlying dyspigmentation - Recommend daily broad spectrum sunscreen SPF 30+ to sun-exposed areas, reapply every 2 hours as needed.  - Staying in the shade or wearing long sleeves, sun glasses (UVA+UVB protection) and wide brim hats (4-inch brim around the entire circumference of the hat) are also recommended for sun protection.  - Call for new or changing lesions.  LENTIGINES, SEBORRHEIC KERATOSES, HEMANGIOMAS - Benign normal skin lesions - Benign-appearing - Call for any changes  MELANOCYTIC  NEVI - Tan-brown and/or pink-flesh-colored symmetric macules and papules - Benign appearing on exam today - Observation - Call clinic for new or changing moles - Recommend daily use of broad spectrum spf 30+ sunscreen to sun-exposed areas.   HISTORY OF BASAL CELL CARCINOMA OF THE SKIN - No evidence of recurrence today upper back right of midline, superficial 2019 - Recommend regular full body skin exams - Recommend daily broad spectrum sunscreen SPF 30+ to sun-exposed areas, reapply every 2 hours as needed.  - Call if any new or changing lesions are noted between office visits   Return in about 1 year (around 08/22/2024) for TBSE, Hx BCC.  Anise Salvo, RMA, am acting as scribe for Elie Goody, MD .   Documentation: I have reviewed the above documentation for accuracy and completeness, and I agree with the above.  Elie Goody, MD

## 2023-08-23 NOTE — Patient Instructions (Signed)
Melanoma ABCDEs  Melanoma is the most dangerous type of skin cancer, and is the leading cause of death from skin disease.  You are more likely to develop melanoma if you: Have light-colored skin, light-colored eyes, or red or blond hair Spend a lot of time in the sun Tan regularly, either outdoors or in a tanning bed Have had blistering sunburns, especially during childhood Have a close family member who has had a melanoma Have atypical moles or large birthmarks  Early detection of melanoma is key since treatment is typically straightforward and cure rates are extremely high if we catch it early.   The first sign of melanoma is often a change in a mole or a new dark spot.  The ABCDE system is a way of remembering the signs of melanoma.  A for asymmetry:  The two halves do not match. B for border:  The edges of the growth are irregular. C for color:  A mixture of colors are present instead of an even brown color. D for diameter:  Melanomas are usually (but not always) greater than 6mm - the size of a pencil eraser. E for evolution:  The spot keeps changing in size, shape, and color.  Please check your skin once per month between visits. You can use a small mirror in front and a large mirror behind you to keep an eye on the back side or your body.   If you see any new or changing lesions before your next follow-up, please call to schedule a visit.  Please continue daily skin protection including broad spectrum sunscreen SPF 30+ to sun-exposed areas, reapplying every 2 hours as needed when you're outdoors.    Due to recent changes in healthcare laws, you may see results of your pathology and/or laboratory studies on MyChart before the doctors have had a chance to review them. We understand that in some cases there may be results that are confusing or concerning to you. Please understand that not all results are received at the same time and often the doctors may need to interpret multiple  results in order to provide you with the best plan of care or course of treatment. Therefore, we ask that you please give [place] 2 business days to thoroughly review all your results before contacting the office for clarification. Should we see a critical lab result, you will be contacted sooner.   If You Need Anything After Your Visit  If you have any questions or concerns for your doctor, please call our main line at [phone] and press option 4 to reach your doctor's medical assistant. If no one answers, please leave a voicemail as directed and we will return your call as soon as possible. Messages left after 4 pm will be answered the following business day.   You may also send [place] a message via MyChart. We typically respond to MyChart messages within 1-2 business days.  For prescription refills, please ask your pharmacy to contact our office. Our fax number is [phone].  If you have an urgent issue when the clinic is closed that cannot wait until the next business day, you can page your doctor at the number below.    Please note that while we do our best to be available for urgent issues outside of office hours, we are not available 24/7.   If you have an urgent issue and are unable to reach [place], you may choose to seek medical care at your doctor's office, retail clinic, urgent care  center, or emergency room.  If you have a medical emergency, please immediately call 911 or go to the emergency department.  Pager Numbers  - Dr. Gwen Pounds: [phone]  - Dr. Roseanne Reno: [phone]  - Dr. Katrinka Blazing: [phone]   In the event of inclement weather, please call our main line at [phone] for an update on the status of any delays or closures.  Dermatology Medication Tips: Please keep the boxes that topical medications come in in order to help keep track of the instructions about where and how to use these. Pharmacies typically print the medication instructions only on the boxes and not  directly on the medication tubes.   If your medication is too expensive, please contact our office at [phone] option 4 or send [place] a message through MyChart.   We are unable to tell what your co-pay for medications will be in advance as this is different depending on your insurance coverage. However, we may be able to find a substitute medication at lower cost or fill out paperwork to get insurance to cover a needed medication.   If a prior authorization is required to get your medication covered by your insurance company, please allow [place] 1-2 business days to complete this process.  Drug prices often vary depending on where the prescription is filled and some pharmacies may offer cheaper prices.  The website www.goodrx.com contains coupons for medications through different pharmacies. The prices here do not account for what the cost may be with help from insurance (it may be cheaper with your insurance), but the website can give you the price if you did not use any insurance.  - You can print the associated coupon and take it with your prescription to the pharmacy.  - You may also stop by our office during regular business hours and pick up a GoodRx coupon card.  - If you need your prescription sent electronically to a different pharmacy, notify our office through [facility] or by phone at [phone] option 4.

## 2023-09-07 ENCOUNTER — Telehealth: Payer: Self-pay | Admitting: Internal Medicine

## 2023-09-07 NOTE — Telephone Encounter (Signed)
Patient just called and said could she get some medication she said she has a sinus infection. Her pharmacy she uses is [id number] [place], [place] - [address] AT [facility] OF [address] [place], [place] [address] Phone: [phone]  Fax: [phone]  Her number is [phone]

## 2023-09-07 NOTE — Telephone Encounter (Signed)
Called patient to let her know that visit is needed in order to determine best treatment options. Symptoms are sinus congestion, drainage, sinus pressure. COVID negative at home. Patient has been scheduled to see NP

## 2023-09-08 ENCOUNTER — Ambulatory Visit: Payer: PPO | Admitting: Family

## 2023-09-09 ENCOUNTER — Other Ambulatory Visit: Payer: Self-pay | Admitting: Internal Medicine

## 2023-09-29 ENCOUNTER — Other Ambulatory Visit: Payer: Self-pay | Admitting: Internal Medicine

## 2023-09-29 ENCOUNTER — Other Ambulatory Visit: Payer: Self-pay | Admitting: Cardiovascular Disease

## 2023-10-13 DIAGNOSIS — E7800 Pure hypercholesterolemia, unspecified: Secondary | ICD-10-CM | POA: Diagnosis not present

## 2023-10-13 DIAGNOSIS — R739 Hyperglycemia, unspecified: Secondary | ICD-10-CM | POA: Diagnosis not present

## 2023-10-14 LAB — LIPID PANEL
Chol/HDL Ratio: 2.4 {ratio} (ref 0.0–4.4)
Cholesterol, Total: 156 mg/dL (ref 100–199)
HDL: 64 mg/dL (ref >39)
LDL Chol Calc (NIH): 77 mg/dL (ref 0–99)
Triglycerides: 77 mg/dL (ref 0–149)
VLDL Cholesterol Cal: 15 mg/dL (ref 5–40)

## 2023-10-14 LAB — BASIC METABOLIC PANEL
BUN/Creatinine Ratio: 15 (ref 12–28)
BUN: 13 mg/dL (ref 8–27)
CO2: 25 mmol/L (ref 20–29)
Calcium: 9.3 mg/dL (ref 8.7–10.3)
Chloride: 98 mmol/L (ref 96–106)
Creatinine, Ser: 0.88 mg/dL (ref 0.57–1.00)
Glucose: 107 mg/dL — ABNORMAL HIGH (ref 70–99)
Potassium: 3.6 mmol/L (ref 3.5–5.2)
Sodium: 138 mmol/L (ref 134–144)
eGFR: 66 mL/min/{1.73_m2} (ref >59)

## 2023-10-14 LAB — HEMOGLOBIN A1C
Est. average glucose Bld gHb Est-mCnc: 117 mg/dL
Hgb A1c MFr Bld: 5.7 % — ABNORMAL HIGH (ref 4.8–5.6)

## 2023-10-14 LAB — HEPATIC FUNCTION PANEL
ALT: 11 [IU]/L (ref 0–32)
AST: 16 [IU]/L (ref 0–40)
Albumin: 4.3 g/dL (ref 3.7–4.7)
Alkaline Phosphatase: 62 [IU]/L (ref 44–121)
Bilirubin Total: 1.2 mg/dL (ref 0.0–1.2)
Bilirubin, Direct: 0.45 mg/dL — ABNORMAL HIGH (ref 0.00–0.40)
Total Protein: 6.6 g/dL (ref 6.0–8.5)

## 2023-10-18 ENCOUNTER — Encounter: Payer: Self-pay | Admitting: Internal Medicine

## 2023-10-18 ENCOUNTER — Ambulatory Visit (INDEPENDENT_AMBULATORY_CARE_PROVIDER_SITE_OTHER): Payer: PPO | Admitting: Internal Medicine

## 2023-10-18 VITALS — BP 128/72 | HR 97 | Temp 98.0°F | Resp 16 | Ht 64.0 in | Wt 211.2 lb

## 2023-10-18 DIAGNOSIS — D6869 Other thrombophilia: Secondary | ICD-10-CM | POA: Diagnosis not present

## 2023-10-18 DIAGNOSIS — F419 Anxiety disorder, unspecified: Secondary | ICD-10-CM | POA: Diagnosis not present

## 2023-10-18 DIAGNOSIS — I700 Atherosclerosis of aorta: Secondary | ICD-10-CM | POA: Diagnosis not present

## 2023-10-18 DIAGNOSIS — E7800 Pure hypercholesterolemia, unspecified: Secondary | ICD-10-CM

## 2023-10-18 DIAGNOSIS — Z1231 Encounter for screening mammogram for malignant neoplasm of breast: Secondary | ICD-10-CM

## 2023-10-18 DIAGNOSIS — Z720 Tobacco use: Secondary | ICD-10-CM | POA: Diagnosis not present

## 2023-10-18 DIAGNOSIS — I739 Peripheral vascular disease, unspecified: Secondary | ICD-10-CM | POA: Diagnosis not present

## 2023-10-18 DIAGNOSIS — I10 Essential (primary) hypertension: Secondary | ICD-10-CM | POA: Diagnosis not present

## 2023-10-18 DIAGNOSIS — I4821 Permanent atrial fibrillation: Secondary | ICD-10-CM

## 2023-10-18 DIAGNOSIS — J449 Chronic obstructive pulmonary disease, unspecified: Secondary | ICD-10-CM

## 2023-10-18 DIAGNOSIS — R739 Hyperglycemia, unspecified: Secondary | ICD-10-CM | POA: Diagnosis not present

## 2023-10-18 NOTE — Progress Notes (Signed)
Subjective:    Patient ID: Danielle Yates, female    DOB: 11-15-1941, 82 y.o.   MRN: [id number]  Patient here for  Chief Complaint  Patient presents with   Medical Management of Chronic Issues    HPI Here to f/u regarding her blood pressure, blood sugar, afib and hypercholesterolemia. Also with documented COPD. Continues on eliquis. She has previously not tolerated metoprolol due to bradycardia and fatigue. She has not tolerated amlodipine or Coreg secondary to headache and dizziness. She is also known to be intolerant to high-intensity statins, tolerating pravastatin. No chest pain reported.  Breathing overall stable.  Being followed by ophthalmology.  Stopped probiotic.    Past Medical History:  Diagnosis Date   Allergy    Aortic valve sclerosis    a. TTE 2013: EF 60-65%, no RWMA, Gr1DD, trivial MR, mildly dilated LA 43 mm; b. TTE 08/2017: EF 65-70%, mild LVH, aortic valve sclerosis without stenosis, mildly dilated LA 36 mm, RV size and sys fxn nl   Arthropathy, unspecified, site unspecified    Basal cell carcinoma 06/15/2018   upper back right of midline/superficial   Cataract    Chronic rhinitis    Disorder of bone and cartilage, unspecified    Diverticulosis of colon (without mention of hemorrhage)    Essential hypertension    a. refractory HTN; b. renal US 2013 negative for RAS   External hemorrhoids without mention of complication    Medication intolerance    Obesity, unspecified    Other abnormal glucose    Panic disorder without agoraphobia    Peripheral arterial disease (HCC)    a.  Known bilateral common iliac disease-> medical therapy-asymptomatic.   Persistent atrial fibrillation (HCC)    a. diagnosed 10/19/17; b. CHADS2VASc 5 (HTN, age x 2, vascular disease, female)-->Eliquis   Pure hypercholesterolemia    Tobacco use disorder    Unspecified vitamin D deficiency    Past Surgical History:  Procedure Laterality Date   APPENDECTOMY  1985   CHOLECYSTECTOMY   1985   with open wound   EYE SURGERY  2011   CATARACTS -  BOTH   Removed spot on face     [name]   TONSILLECTOMY AND ADENOIDECTOMY  1946   VAGINAL HYSTERECTOMY  1982   fibroid and endometriosis; one ovary remaining   Family History  Problem Relation Age of Onset   Hypertension Sister    Heart disease Brother    Hypertension Brother    Hyperlipidemia Brother    Cancer Brother    Aneurysm Mother    Rheumatic fever Mother        as an adult   Heart disease Father        MI x 2   Social History   Socioeconomic History   Marital status: Widowed    Spouse name: Not on file   Number of children: 0   Years of education: college   Highest education level: Bachelor's degree (e.g., BA, AB, BS)  Occupational History   Occupation: Academic librarian    Comment: [employer] x 20 years  Tobacco Use   Smoking status: Every Day    Current packs/day: 0.50    Average packs/day: 0.5 packs/day for 50.0 years (25.0 ttl pk-yrs)    Types: Cigarettes   Smokeless tobacco: Never   Tobacco comments:    10 a day  Vaping Use   Vaping status: Never Used  Substance and Sexual Activity   Alcohol use: No    Alcohol/week: 0.0  standard drinks of alcohol   Drug use: No   Sexual activity: Never  Other Topics Concern   Not on file  Social History Narrative   Lives alone. Widowed after 23 years. Not dating. Has dog. Exercise: Walking; light has not been walking as she used to before her 2 dogs passed away. Tries to walk some. Caffeine: Carbonated beverages, 3 servings/day;Diet coke. 3 a day.      10/02/12  PER PATIENT'S LAVENDER FORM: WALK 5 DAYS/WEEK FOR 30 MINUTES   Social Determinants of Health   Financial Resource Strain: Low Risk  (10/17/2023)   Overall Financial Resource Strain (CARDIA)    Difficulty of Paying Living Expenses: Not hard at all  Food Insecurity: Not on file  Transportation Needs: No Transportation Needs (10/17/2023)   PRAPARE - Administrator, Civil Service  (Medical): No    Lack of Transportation (Non-Medical): No  Physical Activity: Unknown (10/17/2023)   Exercise Vital Sign    Days of Exercise per Week: 0 days    Minutes of Exercise per Session: Not on file  Stress: No Stress Concern Present (10/17/2023)   [employer] of Occupational Health - Occupational Stress Questionnaire    Feeling of Stress : Only a little  Social Connections: Moderately Integrated (10/17/2023)   Social Connection and Isolation Panel [NHANES]    Frequency of Communication with Friends and Family: More than three times a week    Frequency of Social Gatherings with Friends and Family: Twice a week    Attends Religious Services: More than 4 times per year    Active Member of [employer] or Organizations: Yes    Attends Banker Meetings: 1 to 4 times per year    Marital Status: Widowed     Review of Systems  Constitutional:  Negative for appetite change and unexpected weight change.  HENT:  Negative for congestion and sinus pressure.   Respiratory:  Negative for cough, chest tightness and shortness of breath.   Cardiovascular:  Negative for chest pain and palpitations.  Gastrointestinal:  Negative for abdominal pain, diarrhea, nausea and vomiting.  Genitourinary:  Negative for difficulty urinating and dysuria.  Musculoskeletal:  Negative for joint swelling and myalgias.  Skin:  Negative for color change and rash.  Neurological:  Negative for dizziness and headaches.  Psychiatric/Behavioral:  Negative for agitation and dysphoric mood.        Objective:     BP 128/72   Pulse 97   Temp 98 F (36.7 C)   Resp 16   Ht 5\' 4"  (1.626 m)   Wt 211 lb 3.2 oz (95.8 kg)   SpO2 98%   BMI 36.25 kg/m  Wt Readings from Last 3 Encounters:  10/19/23 211 lb 3.2 oz (95.8 kg)  06/14/23 219 lb 3.2 oz (99.4 kg)  02/25/23 226 lb 9.6 oz (102.8 kg)    Physical Exam Vitals reviewed.  Constitutional:      General: She is not in acute distress.    Appearance:  Normal appearance.  HENT:     Head: Normocephalic and atraumatic.     Right Ear: External ear normal.     Left Ear: External ear normal.  Eyes:     General: No scleral icterus.       Right eye: No discharge.        Left eye: No discharge.     Conjunctiva/sclera: Conjunctivae normal.  Neck:     Thyroid: No thyromegaly.  Cardiovascular:     Rate  and Rhythm: Normal rate and regular rhythm.  Pulmonary:     Effort: No respiratory distress.     Breath sounds: Normal breath sounds. No wheezing.  Abdominal:     General: Bowel sounds are normal.     Palpations: Abdomen is soft.     Tenderness: There is no abdominal tenderness.  Musculoskeletal:        General: No swelling or tenderness.     Cervical back: Neck supple. No tenderness.  Lymphadenopathy:     Cervical: No cervical adenopathy.  Skin:    Findings: No erythema or rash.  Neurological:     Mental Status: She is alert.  Psychiatric:        Mood and Affect: Mood normal.        Behavior: Behavior normal.      Outpatient Encounter Medications as of 10/18/2023  Medication Sig   albuterol (VENTOLIN HFA) 108 (90 Base) MCG/ACT inhaler INHALE 2 PUFFS INTO THE LUNGS EVERY 6 HOURS AS NEEDED FOR WHEEZING OR SHORTNESS OF BREATH   diltiazem (TIAZAC) 360 MG 24 hr capsule Take 1 capsule (360 mg total) by mouth daily.   ELIQUIS 5 MG TABS tablet TAKE 1 TABLET BY MOUTH TWICE A DAY   enalapril (VASOTEC) 20 MG tablet TAKE 1 TABLET BY MOUTH IN THE MORNING AND 2 TABLETS IN THE AFTERNOON.   FLUoxetine (PROZAC) 20 MG capsule TAKE 1 CAPSULE BY MOUTH EVERY DAY   fluticasone (FLONASE) 50 MCG/ACT nasal spray SHAKE LIQUID AND USE 2 SPRAYS IN EACH NOSTRIL DAILY   Multiple Vitamins-Minerals (PRESERVISION AREDS) CAPS Take 1 capsule by mouth daily.   mupirocin ointment (BACTROBAN) 2 % Apply 1 Application topically 2 (two) times daily.   pravastatin (PRAVACHOL) 10 MG tablet TAKE 1 TABLET(10 MG) BY MOUTH DAILY   Probiotic Product (PROBIOTIC DAILY PO) Take  1 tablet by mouth.   tiotropium (SPIRIVA HANDIHALER) 18 MCG inhalation capsule Place 1 capsule (18 mcg total) into inhaler and inhale daily.   triamterene-hydrochlorothiazide (MAXZIDE-25) 37.5-25 MG tablet TAKE 1 TABLET BY MOUTH EVERY DAY   VITAMIN D, CHOLECALCIFEROL, PO Take by mouth daily.   No facility-administered encounter medications on file as of 10/18/2023.     Lab Results  Component Value Date   WBC 6.8 02/02/2023   HGB 14.7 02/02/2023   HCT 45.3 02/02/2023   PLT 252 02/02/2023   GLUCOSE 107 (H) 10/13/2023   CHOL 156 10/13/2023   TRIG 77 10/13/2023   HDL 64 10/13/2023   LDLCALC 77 10/13/2023   ALT 11 10/13/2023   AST 16 10/13/2023   NA 138 10/13/2023   K 3.6 10/13/2023   CL 98 10/13/2023   CREATININE 0.88 10/13/2023   BUN 13 10/13/2023   CO2 25 10/13/2023   TSH 2.710 02/02/2023   HGBA1C 5.7 (H) 10/13/2023       Assessment & Plan:  Visit for screening mammogram -     3D Screening Mammogram, Left and Right; Future  Hyperglycemia Assessment & Plan: Low carb diet and exercise.  Follow met b and a1c.   Orders: -     Hemoglobin A1c  Pure hypercholesterolemia Assessment & Plan: Pravastatin.  Follow lipid panel and liver function tests.  Low cholesterol diet and exercise.  Intolerance to other statin medication. Intolerance to zetia.   Orders: -     Lipid panel -     Hepatic function panel  Essential hypertension, benign Assessment & Plan: Continue diltiazem and enalapril.  Follow pressures.   Orders: -  CBC with Differential/Platelet -     Basic metabolic panel -     TSH  Permanent atrial fibrillation (HCC) Assessment & Plan: No increased heart rate or palpitations.  Continue eliquis.  On diltiazem.  Follow.  Cost is an issue. See if can help with pt assistance for eliquis.   Orders: -     AMB Referral VBCI Care Management  Acquired thrombophilia (HCC) Assessment & Plan: On eliquis.  Afib.    Aortic atherosclerosis (HCC) Assessment &  Plan: Continue pravastatin   Chronic obstructive pulmonary disease, unspecified COPD type (HCC) Assessment & Plan: Has been followed by pulmonary.  Breathing overall stable. Continue current inhaler regimen - spiriva.    Anxiety Assessment & Plan: Has done well on prozac.  Follow.    PAD (peripheral artery disease) (HCC) Assessment & Plan: Affecting common iliac arteries mainly.  Continue risk factor modification.  Continue pravastatin.    Tobacco abuse Assessment & Plan: Have discussed the need to quit.  Follow.       Dale [place], MD

## 2023-10-19 ENCOUNTER — Encounter: Payer: Self-pay | Admitting: Internal Medicine

## 2023-10-19 ENCOUNTER — Telehealth: Payer: Self-pay

## 2023-10-19 NOTE — Progress Notes (Signed)
   Care Guide Note  10/19/2023 Name: Danielle Yates MRN: [id number] DOB: 12-18-1940  Referred by: Dale [place], MD Reason for referral : Care Coordination (Outreach to schedule with Pharm d )   Danielle Yates is a 82 y.o. year old female who is a primary care patient of Dale [place], MD. Danielle Yates was referred to the pharmacist for assistance related to HTN.    Successful contact was made with the patient to discuss pharmacy services including being ready for the pharmacist to call at least 5 minutes before the scheduled appointment time, to have medication bottles and any blood sugar or blood pressure readings ready for review. The patient agreed to meet with the pharmacist via with the pharmacist via telephone visit on (date/time).  10/24/2023  Penne Lash, RMA Care Guide [facility]  [place], [place] [address] Direct Dial: [phone] [email or web address]

## 2023-10-23 ENCOUNTER — Encounter: Payer: Self-pay | Admitting: Internal Medicine

## 2023-10-23 NOTE — Assessment & Plan Note (Signed)
Low carb diet and exercise.  Follow met b and a1c.   

## 2023-10-23 NOTE — Assessment & Plan Note (Signed)
Continue pravastatin 

## 2023-10-23 NOTE — Assessment & Plan Note (Signed)
Affecting common iliac arteries mainly.  Continue risk factor modification.  Continue pravastatin.

## 2023-10-23 NOTE — Assessment & Plan Note (Signed)
Pravastatin.  Follow lipid panel and liver function tests.  Low cholesterol diet and exercise.  Intolerance to other statin medication. Intolerance to zetia.  

## 2023-10-23 NOTE — Assessment & Plan Note (Signed)
No increased heart rate or palpitations.  Continue eliquis.  On diltiazem.  Follow.  Cost is an issue. See if can help with pt assistance for eliquis.

## 2023-10-23 NOTE — Assessment & Plan Note (Signed)
On eliquis.  Afib.  

## 2023-10-23 NOTE — Assessment & Plan Note (Signed)
Continue diltiazem and enalapril.  Follow pressures.

## 2023-10-23 NOTE — Assessment & Plan Note (Signed)
Has done well on prozac.  Follow.

## 2023-10-23 NOTE — Assessment & Plan Note (Signed)
Has been followed by pulmonary.  Breathing overall stable. Continue current inhaler regimen - spiriva.

## 2023-10-23 NOTE — Assessment & Plan Note (Addendum)
Have discussed the need to quit.  Follow.  

## 2023-10-24 ENCOUNTER — Other Ambulatory Visit: Payer: PPO | Admitting: Pharmacist

## 2023-10-24 DIAGNOSIS — E119 Type 2 diabetes mellitus without complications: Secondary | ICD-10-CM

## 2023-10-24 NOTE — Patient Instructions (Signed)
Ms. Danielle Yates,   It was a pleasure to speak with you today! As we discussed:?   I spoke with your insurance company. It looks like your Eliquis cost has been $47/month earlier in the year ($94 for 90 day supply). You have since entered the donut hole/coverage gap which is why your copay has been higher in the past few months.   Changes to Medicare Part D (Drug Coverage) in 2025 As a part of the Inflation Reduction Act (IRA) implemented by the [employer], in 2025,  Medicare must get rid of the "[employer]" coverage gap. This means that if you are used to your medication costs increasing significantly during the year, ideally this will no longer be an issue starting in 2025.  Your cost of Eliquis will go back down in the new year after you've met your initial deductible.  You will not have to worry about the donut hole next year.   According to Medicare the following changes are expected in 2025: Maximum out of pocket drug expenses (amount you are paying at the pharmacy) will be capped at $2,000 per year Getting rid of the Coverage Gap [facility]")  -  (If you reach the out of pocket maximum of $2,000 in copays, your medication costs should drastically DECREASE thereafter, instead of increase)   We also discussed programs that may be able to reduce the cost of brand name medication next year though they only accept patients after out of pocket spending at the pharmacy as we discussed.  GSK program provides inhalers after you have spent $600 at the pharmacy.  BMS program provides Eliquis only after spending 3% of your income at the pharmacy (around $1000 based on your reported income).  If you reach any of these spending milestones in the next year, please let us know and we can re-evaluate your eligibility and help you apply to either program.   You may call the office, or leave my a voicemail on my direct office line: [phone]  Thank you!   Future Appointments   Date Time Provider Department Center  02/15/2024 11:30 AM Dale [place], MD [facility]  08/23/2024  8:30 AM Elie Goody, MD [facility] None    Loree Fee, PharmD Clinical Pharmacist [facility] [phone]

## 2023-10-24 NOTE — Progress Notes (Signed)
   10/24/2023 Name: Danielle Yates MRN: [id number] DOB: [birth year]  Subjective  Chief Complaint  Patient presents with   Medication access    Reason for visit: Danielle Yates is a 82 y.o. year old female who presented for a telephone visit.   They were referred to the pharmacist by their PCP for assistance in managing medication access.   Care Team: Primary Care Provider: Dale [place], MD  Reason for visit: ?  Danielle Yates is a 82 y.o. female who presents today for a phone visit with the pharmacist due to medication access concerns regarding their Eliquis. ?   Medication Access: ?  Prescription drug coverage: Payor: [employer] ADVANTAGE / Plan: [employer]ALTH[employer] ADVANTAGE PPO / Product Type: *No Product type* / .   [employer] Advant[employer] PPO Plan I:   Reports that all medications are not affordable.  90 day supply of Eliquis >$400 Has been getting a 30 day supply for ~$120. Just picked up a $30 day supply of Eliquis last week. Has not gone without medication due to cost.    Current Patient Assistance:  None ; Reports that she was previously enrolled in MAP for her Spiriva inhaler.   Patient lives in a household of 1 with an estimated combined annual income of $33700 . Medicare LIS Eligible: No  Individual Income Limit $1843/month; Exceeded  Assessment and Plan:   1. Medication Access On Eliquis for AF. Reports all medications are free except Eliquis and Sprivia, though Spiriva is $47/month whereas Eliquis has always been >$100/month. Current income is 223% FPL, previously enrolled in MAP for Spiriva (though income limit is 200% for inhalers). Per formulary, Eliquis is a Tier3 preferred brand, theoretically should cost $47/month. Confirmed generic Spiriva (tiotropium) is also a Tier 3, so cost discrepancy is unclear. Pt reports her Eliquis has been this expensive for years and has not changed cost. Spoke with her pharmacy who reports she is in the coverage gap and that her claims  earlier in the year were cheaper (eg $90/90 ds).  Therefore, cost will reduce at the first of the year through Medicare.   Based on reported income, patient may be eligible for MAP programs that accept those w income <250% FPL and above.  Discussed Eliquis MAP through BMS. Is likely eligible based on income, discussed spend requirement. Given 1 month left of the year w recent refill, patient agrees it may be most reasonable to apply in 2025 once OOP is met (3% ? $1000) Discussed [employer] MAP. She would likely be eligible after spending $600 OOP in 2025 for any Ellipta inhaler (LAMA = Arnuity Ellipta).  Patient is not eligible for copay cards due to government insurance. Reviewed with patient the elimination of the donut hole for all Medicare plans in 2025 which will resolve this issue for 2025.   Future Appointments  Date Time Provider Department Center  02/15/2024 11:30 AM Dale [place], MD [facility]  08/23/2024  8:30 AM Elie Goody, MD [facility] None    Loree Fee, PharmD Clinical Pharmacist [facility] [phone]

## 2023-11-19 ENCOUNTER — Other Ambulatory Visit: Payer: Self-pay | Admitting: Cardiovascular Disease

## 2023-12-19 ENCOUNTER — Other Ambulatory Visit: Payer: Self-pay | Admitting: Internal Medicine

## 2023-12-19 NOTE — Telephone Encounter (Signed)
 Copied from CRM [id number]. Topic: Clinical - Medication Refill >> Dec 19, 2023  3:33 PM Franky GRADE wrote: Most Recent Primary Care Visit:  Provider: GERONIMO SHAVER R  Department: [facility]  Visit Type: PATIENT OUTREACH 60  Date: 10/24/2023  Medication: FLUoxetine  (PROZAC ) 20 MG capsule  Has the patient contacted their pharmacy? Yes, pharmacy informed patient to contact pcp's office.  (Agent: If no, request that the patient contact the pharmacy for the refill. If patient does not wish to contact the pharmacy document the reason why and proceed with request.) (Agent: If yes, when and what did the pharmacy advise?)  Is this the correct pharmacy for this prescription? Yes If no, delete pharmacy and type the correct one.  This is the patient's preferred pharmacy:  [id number] [place], [place] - [address] [place] [address] Phone: [phone] Fax: [phone]   Has the prescription been filled recently? No  Is the patient out of the medication? No, she has enough for a week.   Has the patient been seen for an appointment in the last year OR does the patient have an upcoming appointment? Yes  Can we respond through MyChart? Yes  Agent: Please be advised that Rx refills may take up to 3 business days. We ask that you follow-up with your pharmacy.

## 2023-12-20 MED ORDER — FLUOXETINE HCL 20 MG PO CAPS: 20.0000 mg | ORAL_CAPSULE | Freq: Every day | ORAL | 1 refills | Status: DC

## 2023-12-21 ENCOUNTER — Other Ambulatory Visit: Payer: Self-pay | Admitting: Internal Medicine

## 2024-01-05 NOTE — Telephone Encounter (Unsigned)
Copied from CRM [id number]. Topic: Clinical - Prescription Issue >> Jan 05, 2024  3:07 PM Samuel Jester B wrote: Reason for CRM: Pt stated that she would like to receive a callback regarding medication tiotropium (SPIRIVA HANDIHALER) 18 MCG inhalation due to the price being to high and needs an alternate medication instead

## 2024-01-06 ENCOUNTER — Telehealth: Payer: Self-pay

## 2024-01-06 NOTE — Telephone Encounter (Signed)
Reed, Candace L  CR   01/05/24  3:10 PM Unsigned Note Copied from CRM [id number]. Topic: Clinical - Prescription Issue >> Jan 05, 2024  3:07 PM Samuel Jester B wrote: Reason for CRM: Pt stated that she would [facility]

## 2024-01-06 NOTE — Telephone Encounter (Signed)
 LM for pt

## 2024-01-06 NOTE — Telephone Encounter (Signed)
Copied from CRM [id number]. Topic: Clinical - Prescription Issue >> Jan 06, 2024  4:39 PM Sim Boast F wrote: (Previous message cut off, resending to clinical pool) -  Pt stated that she would like to receive a callback regarding medication tiotropium (SPIRIVA HANDIHALER) 18 MCG inhalation due to the price being to high and needs an alternate medication instead

## 2024-01-10 NOTE — Telephone Encounter (Signed)
Did her insurance or pharmacy give her an alternative that they cover.

## 2024-01-10 NOTE — Telephone Encounter (Signed)
Is there an alternative we can prescribe to replace her spiriva due to cost.?

## 2024-01-11 NOTE — Telephone Encounter (Signed)
 Spoke with patient. She has an appointment in March. She is going to call her insurance to see if this is a deductible issue or if there is an alternative that would be cheaper. She will let me know if she decides she would like to switch prior to appt. She has enough to last until her appt.

## 2024-01-30 ENCOUNTER — Other Ambulatory Visit: Payer: Self-pay | Admitting: Cardiovascular Disease

## 2024-01-30 DIAGNOSIS — I4821 Permanent atrial fibrillation: Secondary | ICD-10-CM

## 2024-01-31 NOTE — Telephone Encounter (Signed)
 Prescription refill request for Eliquis received. Indication:afib Last office visit:3/24 Scr:0.88  11/24 Age: 83 Weight:95.8  kg  Prescription refilled

## 2024-02-10 DIAGNOSIS — I10 Essential (primary) hypertension: Secondary | ICD-10-CM | POA: Diagnosis not present

## 2024-02-10 DIAGNOSIS — R739 Hyperglycemia, unspecified: Secondary | ICD-10-CM | POA: Diagnosis not present

## 2024-02-10 DIAGNOSIS — E7800 Pure hypercholesterolemia, unspecified: Secondary | ICD-10-CM | POA: Diagnosis not present

## 2024-02-11 LAB — TSH: TSH: 2.69 u[IU]/mL (ref 0.450–4.500)

## 2024-02-11 LAB — LIPID PANEL
Chol/HDL Ratio: 2.7 ratio (ref 0.0–4.4)
Cholesterol, Total: 167 mg/dL (ref 100–199)
HDL: 63 mg/dL (ref >39)
LDL Chol Calc (NIH): 89 mg/dL (ref 0–99)
Triglycerides: 79 mg/dL (ref 0–149)
VLDL Cholesterol Cal: 15 mg/dL (ref 5–40)

## 2024-02-11 LAB — HEPATIC FUNCTION PANEL
ALT: 11 IU/L (ref 0–32)
AST: 18 IU/L (ref 0–40)
Albumin: 4.4 g/dL (ref 3.7–4.7)
Alkaline Phosphatase: 58 IU/L (ref 44–121)
Bilirubin Total: 1.1 mg/dL (ref 0.0–1.2)
Bilirubin, Direct: 0.33 mg/dL (ref 0.00–0.40)
Total Protein: 6.9 g/dL (ref 6.0–8.5)

## 2024-02-11 LAB — HEMOGLOBIN A1C
Est. average glucose Bld gHb Est-mCnc: 114 mg/dL
Hgb A1c MFr Bld: 5.6 % (ref 4.8–5.6)

## 2024-02-11 LAB — CBC WITH DIFFERENTIAL/PLATELET
Basophils Absolute: 0.1 10*3/uL (ref 0.0–0.2)
Basos: 1 %
EOS (ABSOLUTE): 0.1 10*3/uL (ref 0.0–0.4)
Eos: 1 %
Hematocrit: 47 % — ABNORMAL HIGH (ref 34.0–46.6)
Hemoglobin: 15.5 g/dL (ref 11.1–15.9)
Immature Grans (Abs): 0.1 10*3/uL (ref 0.0–0.1)
Immature Granulocytes: 1 %
Lymphocytes Absolute: 1.5 10*3/uL (ref 0.7–3.1)
Lymphs: 25 %
MCH: 30.1 pg (ref 26.6–33.0)
MCHC: 33 g/dL (ref 31.5–35.7)
MCV: 91 fL (ref 79–97)
Monocytes Absolute: 0.5 10*3/uL (ref 0.1–0.9)
Monocytes: 8 %
Neutrophils Absolute: 3.8 10*3/uL (ref 1.4–7.0)
Neutrophils: 64 %
Platelets: 259 10*3/uL (ref 150–450)
RBC: 5.15 x10E6/uL (ref 3.77–5.28)
RDW: 11.7 % (ref 11.7–15.4)
WBC: 5.9 10*3/uL (ref 3.4–10.8)

## 2024-02-11 LAB — BASIC METABOLIC PANEL
BUN/Creatinine Ratio: 15 (ref 12–28)
BUN: 14 mg/dL (ref 8–27)
CO2: 26 mmol/L (ref 20–29)
Calcium: 9.9 mg/dL (ref 8.7–10.3)
Chloride: 98 mmol/L (ref 96–106)
Creatinine, Ser: 0.96 mg/dL (ref 0.57–1.00)
Glucose: 105 mg/dL — ABNORMAL HIGH (ref 70–99)
Potassium: 3.8 mmol/L (ref 3.5–5.2)
Sodium: 139 mmol/L (ref 134–144)
eGFR: 59 mL/min/{1.73_m2} — ABNORMAL LOW (ref >59)

## 2024-02-15 ENCOUNTER — Ambulatory Visit (INDEPENDENT_AMBULATORY_CARE_PROVIDER_SITE_OTHER): Payer: PPO | Admitting: Internal Medicine

## 2024-02-15 ENCOUNTER — Encounter: Payer: Self-pay | Admitting: Internal Medicine

## 2024-02-15 VITALS — BP 112/62 | HR 100 | Temp 98.0°F | Ht 64.0 in | Wt 198.0 lb

## 2024-02-15 DIAGNOSIS — J449 Chronic obstructive pulmonary disease, unspecified: Secondary | ICD-10-CM | POA: Diagnosis not present

## 2024-02-15 DIAGNOSIS — F419 Anxiety disorder, unspecified: Secondary | ICD-10-CM | POA: Diagnosis not present

## 2024-02-15 DIAGNOSIS — Z720 Tobacco use: Secondary | ICD-10-CM

## 2024-02-15 DIAGNOSIS — R739 Hyperglycemia, unspecified: Secondary | ICD-10-CM | POA: Diagnosis not present

## 2024-02-15 DIAGNOSIS — F439 Reaction to severe stress, unspecified: Secondary | ICD-10-CM | POA: Diagnosis not present

## 2024-02-15 DIAGNOSIS — I700 Atherosclerosis of aorta: Secondary | ICD-10-CM | POA: Diagnosis not present

## 2024-02-15 DIAGNOSIS — I4821 Permanent atrial fibrillation: Secondary | ICD-10-CM

## 2024-02-15 DIAGNOSIS — I10 Essential (primary) hypertension: Secondary | ICD-10-CM | POA: Diagnosis not present

## 2024-02-15 DIAGNOSIS — I739 Peripheral vascular disease, unspecified: Secondary | ICD-10-CM

## 2024-02-15 DIAGNOSIS — E7800 Pure hypercholesterolemia, unspecified: Secondary | ICD-10-CM

## 2024-02-15 DIAGNOSIS — D6869 Other thrombophilia: Secondary | ICD-10-CM

## 2024-02-15 MED ORDER — IPRATROPIUM BROMIDE HFA 17 MCG/ACT IN AERS: 2.0000 | INHALATION_SPRAY | Freq: Four times a day (QID) | RESPIRATORY_TRACT | 12 refills | Status: DC | PRN

## 2024-02-15 NOTE — Progress Notes (Signed)
 Subjective:    Patient ID: Danielle Yates, female    DOB: 1941/03/30, 83 y.o.   MRN: [id number]  Patient here for  Chief Complaint  Patient presents with   Medical Management of Chronic Issues    HPI Here to f/u regarding her blood pressure, blood sugar, afib and hypercholesterolemia. Also with documented COPD. Continues on eliquis. She has previously not tolerated metoprolol due to bradycardia and fatigue. She has not tolerated amlodipine or Coreg secondary to headache and dizziness. Currently taking diltiazem and enalapril. She is also known to be intolerant to high-intensity statins, tolerating pravastatin. Overall appears to handling things relatively well. No chest pain reported. Breathing stable. Discussed spiriva. Needs to change. Spiriva - costly.    Past Medical History:  Diagnosis Date   Allergy    Aortic valve sclerosis    a. TTE 2013: EF 60-65%, no RWMA, Gr1DD, trivial MR, mildly dilated LA 43 mm; b. TTE 08/2017: EF 65-70%, mild LVH, aortic valve sclerosis without stenosis, mildly dilated LA 36 mm, RV size and sys fxn nl   Arthropathy, unspecified, site unspecified    Basal cell carcinoma 06/15/2018   upper back right of midline/superficial   Cataract    Chronic rhinitis    Disorder of bone and cartilage, unspecified    Diverticulosis of colon (without mention of hemorrhage)    Essential hypertension    a. refractory HTN; b. renal US 2013 negative for RAS   External hemorrhoids without mention of complication    Medication intolerance    Obesity, unspecified    Other abnormal glucose    Panic disorder without agoraphobia    Peripheral arterial disease (HCC)    a.  Known bilateral common iliac disease-> medical therapy-asymptomatic.   Persistent atrial fibrillation (HCC)    a. diagnosed 10/19/17; b. CHADS2VASc 5 (HTN, age x 2, vascular disease, female)-->Eliquis   Pure hypercholesterolemia    Tobacco use disorder    Unspecified vitamin D deficiency    Past  Surgical History:  Procedure Laterality Date   APPENDECTOMY  1985   CHOLECYSTECTOMY  1985   with open wound   EYE SURGERY  2011   CATARACTS -  BOTH   Removed spot on face     [name]   TONSILLECTOMY AND ADENOIDECTOMY  1946   VAGINAL HYSTERECTOMY  1982   fibroid and endometriosis; one ovary remaining   Family History  Problem Relation Age of Onset   Hypertension Sister    Heart disease Brother    Hypertension Brother    Hyperlipidemia Brother    Cancer Brother    Aneurysm Mother    Rheumatic fever Mother        as an adult   Heart disease Father        MI x 2   Social History   Socioeconomic History   Marital status: Widowed    Spouse name: Not on file   Number of children: 0   Years of education: college   Highest education level: Bachelor's degree (e.g., BA, AB, BS)  Occupational History   Occupation: Academic librarian    Comment: [employer] x 20 years  Tobacco Use   Smoking status: Every Day    Current packs/day: 0.50    Average packs/day: 0.5 packs/day for 50.0 years (25.0 ttl pk-yrs)    Types: Cigarettes   Smokeless tobacco: Never   Tobacco comments:    10 a day  Vaping Use   Vaping status: Never Used  Substance and Sexual Activity  Alcohol use: No    Alcohol/week: 0.0 standard drinks of alcohol   Drug use: No   Sexual activity: Never  Other Topics Concern   Not on file  Social History Narrative   Lives alone. Widowed after 23 years. Not dating. Has dog. Exercise: Walking; light has not been walking as she used to before her 2 dogs passed away. Tries to walk some. Caffeine: Carbonated beverages, 3 servings/day;Diet coke. 3 a day.      10/02/12  PER PATIENT'S LAVENDER FORM: WALK 5 DAYS/WEEK FOR 30 MINUTES   Social Drivers of Corporate investment banker Strain: Low Risk  (10/17/2023)   Overall Financial Resource Strain (CARDIA)    Difficulty of Paying Living Expenses: Not hard at all  Food Insecurity: Not on file  Transportation Needs: No  Transportation Needs (10/17/2023)   PRAPARE - Administrator, Civil Service (Medical): No    Lack of Transportation (Non-Medical): No  Physical Activity: Unknown (10/17/2023)   Exercise Vital Sign    Days of Exercise per Week: 0 days    Minutes of Exercise per Session: Not on file  Stress: No Stress Concern Present (10/17/2023)   [employer] of Occupational Health - Occupational Stress Questionnaire    Feeling of Stress : Only a little  Social Connections: Moderately Integrated (10/17/2023)   Social Connection and Isolation Panel [NHANES]    Frequency of Communication with Friends and Family: More than three times a week    Frequency of Social Gatherings with Friends and Family: Twice a week    Attends Religious Services: More than 4 times per year    Active Member of [employer] or Organizations: Yes    Attends Banker Meetings: 1 to 4 times per year    Marital Status: Widowed     Review of Systems  Constitutional:  Negative for appetite change and unexpected weight change.  HENT:  Negative for congestion and sinus pressure.   Respiratory:  Negative for cough and chest tightness.        Breathing stable.   Cardiovascular:  Negative for chest pain and palpitations.       No increased swelling.   Gastrointestinal:  Negative for abdominal pain, diarrhea, nausea and vomiting.  Genitourinary:  Negative for difficulty urinating and dysuria.  Musculoskeletal:  Negative for joint swelling and myalgias.  Skin:  Negative for color change and rash.  Neurological:  Negative for dizziness and headaches.  Psychiatric/Behavioral:  Negative for agitation and dysphoric mood.        Objective:     BP 112/62   Pulse 100   Temp 98 F (36.7 C)   Ht 5\' 4"  (1.626 m)   Wt 198 lb (89.8 kg)   SpO2 97%   BMI 33.99 kg/m  Wt Readings from Last 3 Encounters:  02/15/24 198 lb (89.8 kg)  10/19/23 211 lb 3.2 oz (95.8 kg)  06/14/23 219 lb 3.2 oz (99.4 kg)    Physical  Exam Vitals reviewed.  Constitutional:      General: She is not in acute distress.    Appearance: Normal appearance.  HENT:     Head: Normocephalic and atraumatic.     Right Ear: External ear normal.     Left Ear: External ear normal.     Mouth/Throat:     Pharynx: No oropharyngeal exudate or posterior oropharyngeal erythema.  Eyes:     General: No scleral icterus.       Right eye: No discharge.  Left eye: No discharge.     Conjunctiva/sclera: Conjunctivae normal.  Neck:     Thyroid: No thyromegaly.  Cardiovascular:     Rate and Rhythm: Normal rate and regular rhythm.  Pulmonary:     Effort: No respiratory distress.     Breath sounds: Normal breath sounds. No wheezing.  Abdominal:     General: Bowel sounds are normal.     Palpations: Abdomen is soft.     Tenderness: There is no abdominal tenderness.  Musculoskeletal:        General: No swelling or tenderness.     Cervical back: Neck supple. No tenderness.  Lymphadenopathy:     Cervical: No cervical adenopathy.  Skin:    Findings: No erythema or rash.  Neurological:     Mental Status: She is alert.  Psychiatric:        Mood and Affect: Mood normal.        Behavior: Behavior normal.         Outpatient Encounter Medications as of 02/15/2024  Medication Sig   albuterol (VENTOLIN HFA) 108 (90 Base) MCG/ACT inhaler INHALE 2 PUFFS INTO THE LUNGS EVERY 6 HOURS AS NEEDED FOR WHEEZING OR SHORTNESS OF BREATH   diltiazem (TIADYLT ER) 360 MG 24 hr capsule TAKE 1 CAPSULE BY MOUTH EVERY DAY   ELIQUIS 5 MG TABS tablet TAKE 1 TABLET BY MOUTH TWICE A DAY   enalapril (VASOTEC) 20 MG tablet TAKE 1 TABLET BY MOUTH IN THE MORNING AND 2 TABLETS IN THE AFTERNOON.   FLUoxetine (PROZAC) 20 MG capsule Take 1 capsule (20 mg total) by mouth daily.   fluticasone (FLONASE) 50 MCG/ACT nasal spray SHAKE LIQUID AND USE 2 SPRAYS IN EACH NOSTRIL DAILY   ipratropium (ATROVENT HFA) 17 MCG/ACT inhaler Inhale 2 puffs into the lungs every 6 (six)  hours as needed for wheezing.   Multiple Vitamins-Minerals (PRESERVISION AREDS) CAPS Take 1 capsule by mouth daily.   mupirocin ointment (BACTROBAN) 2 % Apply 1 Application topically 2 (two) times daily.   pravastatin (PRAVACHOL) 10 MG tablet TAKE 1 TABLET BY MOUTH EVERY DAY   Probiotic Product (PROBIOTIC DAILY PO) Take 1 tablet by mouth.   triamterene-hydrochlorothiazide (MAXZIDE-25) 37.5-25 MG tablet TAKE 1 TABLET BY MOUTH EVERY DAY   VITAMIN D, CHOLECALCIFEROL, PO Take by mouth daily.   [DISCONTINUED] tiotropium (SPIRIVA HANDIHALER) 18 MCG inhalation capsule Place 1 capsule (18 mcg total) into inhaler and inhale daily.   No facility-administered encounter medications on file as of 02/15/2024.     Lab Results  Component Value Date   WBC 5.9 02/10/2024   HGB 15.5 02/10/2024   HCT 47.0 (H) 02/10/2024   PLT 259 02/10/2024   GLUCOSE 105 (H) 02/10/2024   CHOL 167 02/10/2024   TRIG 79 02/10/2024   HDL 63 02/10/2024   LDLCALC 89 02/10/2024   ALT 11 02/10/2024   AST 18 02/10/2024   NA 139 02/10/2024   K 3.8 02/10/2024   CL 98 02/10/2024   CREATININE 0.96 02/10/2024   BUN 14 02/10/2024   CO2 26 02/10/2024   TSH 2.690 02/10/2024   HGBA1C 5.6 02/10/2024       Assessment & Plan:  Tobacco abuse Assessment & Plan: Have discussed the need to quit. Follow.    Pure hypercholesterolemia Assessment & Plan: Continues on pravastatin. Low cholesterol diet and exercise. Had intolerance to other statin medications and zetia. Follow lipid panel.  Lab Results  Component Value Date   CHOL 167 02/10/2024   HDL 63 02/10/2024  LDLCALC 89 02/10/2024   TRIG 79 02/10/2024   CHOLHDL 2.7 02/10/2024     Orders: -     Hepatic function panel -     Lipid panel  Hyperglycemia Assessment & Plan: Low carb diet and exercise. Follow met b and A1c.   Orders: -     Hemoglobin A1c  Essential hypertension, benign Assessment & Plan: Continue diltiazem and enalapril. Follow pressures. Follow  metabolic panel.   Orders: -     Basic metabolic panel  Stress Assessment & Plan: Overall appears to be handling things well. Follow.    PAD (peripheral artery disease) (HCC) Assessment & Plan: Affecting common iliac arteries mainly.  Continue risk factor modification.  Continue pravastatin.    Chronic obstructive pulmonary disease, unspecified COPD type (HCC) Assessment & Plan: Breathing has been stable. Has done well on current regimen. Spiriva - costly. Wants to change.  Will change to atrovent inhaler. Follow    Permanent atrial fibrillation [facility]) Assessment & Plan: No increased heart rate or palpitations.  Continue eliquis.  On diltiazem.  Follow. No changes today.    Aortic atherosclerosis (HCC) Assessment & Plan: Continue pravastatin.    Acquired thrombophilia (HCC) Assessment & Plan: Afib. Continue eliquis.    Anxiety Assessment & Plan: Continue prozac. Stable.    Other orders -     Ipratropium Bromide HFA; Inhale 2 puffs into the lungs every 6 (six) hours as needed for wheezing.  Dispense: 1 each; Refill: 12     Dale P[place], MD

## 2024-02-18 ENCOUNTER — Encounter: Payer: Self-pay | Admitting: Internal Medicine

## 2024-02-18 NOTE — Assessment & Plan Note (Signed)
 No increased heart rate or palpitations.  Continue eliquis.  On diltiazem.  Follow. No changes today.

## 2024-02-18 NOTE — Assessment & Plan Note (Signed)
 Continues on pravastatin. Low cholesterol diet and exercise. Had intolerance to other statin medications and zetia. Follow lipid panel.  Lab Results  Component Value Date   CHOL 167 02/10/2024   HDL 63 02/10/2024   LDLCALC 89 02/10/2024   TRIG 79 02/10/2024   CHOLHDL 2.7 02/10/2024

## 2024-02-18 NOTE — Assessment & Plan Note (Signed)
 Continue pravastatin

## 2024-02-18 NOTE — Assessment & Plan Note (Signed)
Continue diltiazem and enalapril.  Follow pressures.  Follow metabolic panel.

## 2024-02-18 NOTE — Assessment & Plan Note (Signed)
Affecting common iliac arteries mainly.  Continue risk factor modification.  Continue pravastatin.

## 2024-02-18 NOTE — Assessment & Plan Note (Signed)
 Breathing has been stable. Has done well on current regimen. Spiriva - costly. Wants to change.  Will change to atrovent inhaler. Follow

## 2024-02-18 NOTE — Assessment & Plan Note (Signed)
 Afib. Continue eliquis.

## 2024-02-18 NOTE — Assessment & Plan Note (Signed)
Have discussed the need to quit.  Follow.  

## 2024-02-18 NOTE — Assessment & Plan Note (Signed)
 Overall appears to be handling things well.  Follow.  ?

## 2024-02-18 NOTE — Assessment & Plan Note (Signed)
Continue prozac.  Stable.  

## 2024-02-18 NOTE — Assessment & Plan Note (Signed)
 Low-carb diet and exercise.  Follow met b and A1c.

## 2024-02-21 ENCOUNTER — Telehealth: Payer: Self-pay

## 2024-02-21 NOTE — Telephone Encounter (Signed)
 Copied from CRM [id number]. Topic: General - Other >> Feb 21, 2024  3:00 PM Makayla J wrote: Reason for CRM: Pt states she wanted to advised her PCP team that has an appointment set up with an optimal in [place] and she also states you guys can give her a callback if they need to know the name of the location

## 2024-02-22 NOTE — Telephone Encounter (Signed)
 Patient calling to let me know that she has an eye appointment with a new ophthalmologist in [place] on 03/08/24

## 2024-03-08 DIAGNOSIS — H353123 Nonexudative age-related macular degeneration, left eye, advanced atrophic without subfoveal involvement: Secondary | ICD-10-CM | POA: Insufficient documentation

## 2024-03-08 DIAGNOSIS — H353210 Exudative age-related macular degeneration, right eye, stage unspecified: Secondary | ICD-10-CM | POA: Insufficient documentation

## 2024-03-08 DIAGNOSIS — Z961 Presence of intraocular lens: Secondary | ICD-10-CM | POA: Diagnosis not present

## 2024-03-08 DIAGNOSIS — H353212 Exudative age-related macular degeneration, right eye, with inactive choroidal neovascularization: Secondary | ICD-10-CM | POA: Diagnosis not present

## 2024-03-08 DIAGNOSIS — H26492 Other secondary cataract, left eye: Secondary | ICD-10-CM | POA: Insufficient documentation

## 2024-03-12 ENCOUNTER — Encounter: Payer: Self-pay | Admitting: Physician Assistant

## 2024-03-12 ENCOUNTER — Ambulatory Visit: Payer: PPO | Attending: Physician Assistant | Admitting: Physician Assistant

## 2024-03-12 VITALS — BP 106/70 | HR 80 | Ht 64.0 in | Wt 194.4 lb

## 2024-03-12 DIAGNOSIS — I4821 Permanent atrial fibrillation: Secondary | ICD-10-CM | POA: Diagnosis not present

## 2024-03-12 DIAGNOSIS — E785 Hyperlipidemia, unspecified: Secondary | ICD-10-CM | POA: Diagnosis not present

## 2024-03-12 DIAGNOSIS — I358 Other nonrheumatic aortic valve disorders: Secondary | ICD-10-CM

## 2024-03-12 DIAGNOSIS — I10 Essential (primary) hypertension: Secondary | ICD-10-CM | POA: Diagnosis not present

## 2024-03-12 DIAGNOSIS — R634 Abnormal weight loss: Secondary | ICD-10-CM | POA: Diagnosis not present

## 2024-03-12 DIAGNOSIS — Z720 Tobacco use: Secondary | ICD-10-CM

## 2024-03-12 DIAGNOSIS — I739 Peripheral vascular disease, unspecified: Secondary | ICD-10-CM | POA: Diagnosis not present

## 2024-03-12 NOTE — Progress Notes (Signed)
 Cardiology Office Note    Date:  03/12/2024   ID:  Danielle Yates, Danielle Yates [birth year], MRN [id number]  PCP:  Dale Brimfield, MD  Cardiologist:  Lorine Bears, MD  Electrophysiologist:  None   Chief Complaint: Follow-up  History of Present Illness:   Danielle Yates is a 83 y.o. female with history of permanent A. fib on Eliquis diagnosed in 10/2017, refractory hypertension, PAD, COPD with ongoing tobacco use, hyperlipidemia, and obesity who presents for follow-up of her A. Fib.   She has previously not tolerated metoprolol due to bradycardia and fatigue. She has not tolerated amlodipine or Coreg secondary to headache and dizziness. She is also known to be intolerant to high-intensity statins, tolerating pravastatin. She has previously undergone echo in 2013 that showed normal LV systolic function with an EF of 60-65%, no RWMA, Gr1DD, trivial mitral regurgitation, and a mildly dilated left atrium measuring 43 mm. Renal artery ultrasound in 2013 showed no evidence of RAS. She has known bilateral common iliac artery disease that is being managed medically given lack of claudication symptoms. She was diagnosed with A. fib with RVR in 10/2017 and was started on Eliquis at that time given a CHADS2VASc of 5.  She was asymptomatic.  Echo in 08/2017 showed normal LV systolic function with an EF of 65 to 70%, mild LVH, aortic valve sclerosis without stenosis, mildly dilated left atrium, RV cavity size is normal with normal RV systolic function.  She has deferred PCSK9 inhibitor.  She was last seen in the office in 02/2023 and was doing well at that time.  No changes in pharmacotherapy were indicated.  She comes in doing well from a cardiac perspective and is without symptoms of angina or cardiac decompensation.  No palpitations, dyspnea, dizziness, presyncope, or syncope.  No falls, hematochezia, or melena.  She is adherent to cardiac medications without of target effect.  Her weight is down 32 pounds today  when compared to her visit in 02/2023.  She indicates this is unintentional and attributed to stress.  She continues to smoke and is unable to quit at this time.  No lower extremity swelling or progressive orthopnea.   Labs independently reviewed: 02/2024 - TSH normal, albumin 4.4, AST/ALT normal, A1c 5.6, BUN 14, serum creatinine 0.96, potassium 3.8, TC 167, TG 79, HDL 63, LDL 89, Hgb 15.5, PLT 259  Past Medical History:  Diagnosis Date   Allergy    Aortic valve sclerosis    a. TTE 2013: EF 60-65%, no RWMA, Gr1DD, trivial MR, mildly dilated LA 43 mm; b. TTE 08/2017: EF 65-70%, mild LVH, aortic valve sclerosis without stenosis, mildly dilated LA 36 mm, RV size and sys fxn nl   Arthropathy, unspecified, site unspecified    Basal cell carcinoma 06/15/2018   upper back right of midline/superficial   Cataract    Chronic rhinitis    Disorder of bone and cartilage, unspecified    Diverticulosis of colon (without mention of hemorrhage)    Essential hypertension    a. refractory HTN; b. renal US 2013 negative for RAS   External hemorrhoids without mention of complication    Medication intolerance    Obesity, unspecified    Other abnormal glucose    Panic disorder without agoraphobia    Peripheral arterial disease (HCC)    a.  Known bilateral common iliac disease-> medical therapy-asymptomatic.   Persistent atrial fibrillation (HCC)    a. diagnosed 10/19/17; b. CHADS2VASc 5 (HTN, age x 2, vascular disease, female)-->Eliquis  Pure hypercholesterolemia    Tobacco use disorder    Unspecified vitamin D deficiency     Past Surgical History:  Procedure Laterality Date   APPENDECTOMY  1985   CHOLECYSTECTOMY  1985   with open wound   EYE SURGERY  2011   CATARACTS -  BOTH   Removed spot on face     [name]   TONSILLECTOMY AND ADENOIDECTOMY  1946   VAGINAL HYSTERECTOMY  1982   fibroid and endometriosis; one ovary remaining    Current Medications: Current Meds  Medication Sig   albuterol  (VENTOLIN HFA) 108 (90 Base) MCG/ACT inhaler INHALE 2 PUFFS INTO THE LUNGS EVERY 6 HOURS AS NEEDED FOR WHEEZING OR SHORTNESS OF BREATH   apixaban (ELIQUIS) 5 MG TABS tablet Take 5 mg by mouth 2 (two) times daily.   Cholecalciferol 125 MCG (5000 UT) capsule Take 5,000 Units by mouth daily.   diltiazem (TIADYLT ER) 360 MG 24 hr capsule TAKE 1 CAPSULE BY MOUTH EVERY DAY   diltiazem (TIADYLT ER) 360 MG 24 hr capsule Take 1 capsule by mouth daily.   ELIQUIS 5 MG TABS tablet TAKE 1 TABLET BY MOUTH TWICE A DAY   enalapril (VASOTEC) 20 MG tablet TAKE 1 TABLET BY MOUTH IN THE MORNING AND 2 TABLETS IN THE AFTERNOON.   FLUoxetine (PROZAC) 20 MG capsule Take 1 capsule (20 mg total) by mouth daily.   fluticasone (FLONASE) 50 MCG/ACT nasal spray SHAKE LIQUID AND USE 2 SPRAYS IN EACH NOSTRIL DAILY   ipratropium (ATROVENT HFA) 17 MCG/ACT inhaler Inhale 2 puffs into the lungs every 6 (six) hours as needed for wheezing.   Multiple Vitamins-Minerals (PRESERVISION AREDS) CAPS Take 1 capsule by mouth daily.   mupirocin ointment (BACTROBAN) 2 % Apply 1 Application topically 2 (two) times daily.   pravastatin (PRAVACHOL) 10 MG tablet TAKE 1 TABLET BY MOUTH EVERY DAY   Probiotic Product (PROBIOTIC DAILY PO) Take 1 tablet by mouth.   triamterene-hydrochlorothiazide (MAXZIDE-25) 37.5-25 MG tablet TAKE 1 TABLET BY MOUTH EVERY DAY   VITAMIN D, CHOLECALCIFEROL, PO Take by mouth daily.    Allergies:   Amlodipine, Codeine, Coreg [carvedilol], Crestor [rosuvastatin], Metoprolol, Statins, and Nickel   Social History   Socioeconomic History   Marital status: Widowed    Spouse name: Not on file   Number of children: 0   Years of education: college   Highest education level: Bachelor's degree (e.g., BA, AB, BS)  Occupational History   Occupation: Academic librarian    Comment: [employer] x 20 years  Tobacco Use   Smoking status: Every Day    Current packs/day: 0.50    Average packs/day: 0.5 packs/day for 50.0 years  (25.0 ttl pk-yrs)    Types: Cigarettes   Smokeless tobacco: Never   Tobacco comments:    10 a day  Vaping Use   Vaping status: Never Used  Substance and Sexual Activity   Alcohol use: No    Alcohol/week: 0.0 standard drinks of alcohol   Drug use: No   Sexual activity: Never  Other Topics Concern   Not on file  Social History Narrative   Lives alone. Widowed after 23 years. Not dating. Has dog. Exercise: Walking; light has not been walking as she used to before her 2 dogs passed away. Tries to walk some. Caffeine: Carbonated beverages, 3 servings/day;Diet coke. 3 a day.      10/02/12  PER PATIENT'S LAVENDER FORM: WALK 5 DAYS/WEEK FOR 30 MINUTES   Social Drivers of Health  Financial Resource Strain: Low Risk  (10/17/2023)   Overall Financial Resource Strain (CARDIA)    Difficulty of Paying Living Expenses: Not hard at all  Food Insecurity: Not on file  Transportation Needs: No Transportation Needs (10/17/2023)   PRAPARE - Administrator, Civil Service (Medical): No    Lack of Transportation (Non-Medical): No  Physical Activity: Unknown (10/17/2023)   Exercise Vital Sign    Days of Exercise per Week: 0 days    Minutes of Exercise per Session: Not on file  Stress: No Stress Concern Present (10/17/2023)   [employer] of Occupational Health - Occupational Stress Questionnaire    Feeling of Stress : Only a little  Social Connections: Moderately Integrated (10/17/2023)   Social Connection and Isolation Panel [NHANES]    Frequency of Communication with Friends and Family: More than three times a week    Frequency of Social Gatherings with Friends and Family: Twice a week    Attends Religious Services: More than 4 times per year    Active Member of [employer] or Organizations: Yes    Attends Banker Meetings: 1 to 4 times per year    Marital Status: Widowed     Family History:  The patient's family history includes Aneurysm in her mother; Cancer in her  brother; Heart disease in her brother and father; Hyperlipidemia in her brother; Hypertension in her brother and sister; Rheumatic fever in her mother.  ROS:   12-point review of systems is negative unless otherwise noted in the HPI.   EKGs/Labs/Other Studies Reviewed:    Studies reviewed were summarized above. The additional studies were reviewed today:  2D echo 08/2017: - Left ventricle: The cavity size was normal. Wall thickness was   increased in a pattern of mild LVH. Systolic function was   vigorous. The estimated ejection fraction was in the range of 65%   to 70%. - Aortic valve: Trileaflet; mildly thickened leaflets. Sclerosis   without stenosis. - Mitral valve: Mildly thickened leaflets . - Left atrium: The atrium was mildly dilated. - Right ventricle: The cavity size was normal. Systolic function   was normal.   EKG:  EKG is ordered today.  The EKG ordered today demonstrates A-fib, 70 bpm, nonspecific ST-T changes  Recent Labs: 02/10/2024: ALT 11; BUN 14; Creatinine, Ser 0.96; Hemoglobin 15.5; Platelets 259; Potassium 3.8; Sodium 139; TSH 2.690  Recent Lipid Panel    Component Value Date/Time   CHOL 167 02/10/2024 0835   TRIG 79 02/10/2024 0835   HDL 63 02/10/2024 0835   CHOLHDL 2.7 02/10/2024 0835   LDLCALC 89 02/10/2024 0835    PHYSICAL EXAM:    VS:  BP 106/70   Pulse 80   Ht 5\' 4"  (1.626 m)   Wt 194 lb 6.4 oz (88.2 kg)   SpO2 92%   BMI 33.37 kg/m   BMI: Body mass index is 33.37 kg/m.  Physical Exam Vitals reviewed.  Constitutional:      Appearance: She is well-developed.  HENT:     Head: Normocephalic and atraumatic.  Eyes:     General:        Right eye: No discharge.        Left eye: No discharge.  Cardiovascular:     Rate and Rhythm: Normal rate. Rhythm irregularly irregular.     Heart sounds: S1 normal and S2 normal. Heart sounds not distant. No midsystolic click and no opening snap. Murmur heard.     Systolic murmur is present  with a grade of  1/6 at the upper right sternal border.     No friction rub.  Pulmonary:     Effort: Pulmonary effort is normal. No respiratory distress.     Breath sounds: Normal breath sounds. No decreased breath sounds, wheezing, rhonchi or rales.  Chest:     Chest wall: No tenderness.  Musculoskeletal:     Cervical back: Normal range of motion.  Skin:    General: Skin is warm and dry.     Nails: There is no clubbing.  Neurological:     Mental Status: She is alert and oriented to person, place, and time.  Psychiatric:        Speech: Speech normal.        Behavior: Behavior normal.        Thought Content: Thought content normal.        Judgment: Judgment normal.     Wt Readings from Last 3 Encounters:  03/12/24 194 lb 6.4 oz (88.2 kg)  02/15/24 198 lb (89.8 kg)  10/19/23 211 lb 3.2 oz (95.8 kg)     ASSESSMENT & PLAN:   Permanent A-fib: She remains in rate controlled A-fib and is asymptomatic.  Continue long-acting diltiazem.  CHA2DS2-VASc at least 5 (HTN, age x 2, vascular disease, sex category).  She remains on apixaban 5 mg twice daily and does not meet reduced dosing criteria.  No falls or symptoms concerning for bleeding.  Recent labs stable.  PAD: No symptoms concerning for critical limb ischemia or lifestyle limiting claudication.  Continue walking regimen.  She is on apixaban at place of aspirin to minimize bleeding risk.  On statin as outlined below.  Aortic valve sclerosis: No evidence of stenosis on prior echo.  Update echo prior to next appointment.  HTN: Blood pressure is well-controlled in the office today.  Remains on diltiazem, enalapril, and Maxide.  HLD: LDL 89 in 02/2024 with normal AST/ALT at that time.  Intolerant to high intensity statins and ezetimibe.  Target LDL less than 70.  Remains on pravastatin 10 mg.  Tobacco use/unintentional weight loss: Complete cessation is encouraged.  She declines lung cancer screening program/evaluation for unintentional weight loss.   Indicates even if she found out that she had cancer she would probably not treat it.  She will try nicotine lozenges.   Disposition: F/u with Dr. Kirke Corin or an APP in 12 months.   Medication Adjustments/Labs and Tests Ordered: Current medicines are reviewed at length with the patient today.  Concerns regarding medicines are outlined above. Medication changes, Labs and Tests ordered today are summarized above and listed in the Patient Instructions accessible in Encounters.   Signed, Eula Listen, PA-C 03/12/2024 11:59 AM     [facility] [place] [address] [place], [place] [address] [phone]

## 2024-03-12 NOTE — Patient Instructions (Signed)
 Medication Instructions:  Your Physician recommend you continue on your current medication as directed.    *If you need a refill on your cardiac medications before your next appointment, please call your pharmacy*  Lab Work: None ordered at this time   Testing/Procedures: Your physician has requested that you have an echocardiogram in 12 months. Echocardiography is a painless test that uses sound waves to create images of your heart. It provides your doctor with information about the size and shape of your heart and how well your heart's chambers and valves are working.   You may receive an ultrasound enhancing agent through an IV if needed to better visualize your heart during the echo. This procedure takes approximately one hour.  There are no restrictions for this procedure.  This will take place at [address] [facility] [address]) [address], [place] [address]  Please note: We ask at that you not bring children with you during ultrasound (echo/ vascular) testing. Due to room size and safety concerns, children are not allowed in the ultrasound rooms during exams. Our front office staff cannot provide observation of children in our lobby area while testing is being conducted. An adult accompanying a patient to their appointment will only be allowed in the ultrasound room at the discretion of the ultrasound technician under special circumstances. We apologize for any inconvenience.   Follow-Up: At [facility], you and your health needs are our priority.  As part of our continuing mission to provide you with exceptional heart care, our providers are all part of one team.  This team includes your primary Cardiologist (physician) and Advanced Practice Providers or APPs (Physician Assistants and Nurse Practitioners) who all work together to provide you with the care you need, when you need it.  Your next appointment:   12 month(s)  Provider:   You may see Lorine Bears, MD or  one of the following Advanced Practice Providers on your designated Care Team:   Nicolasa Ducking, NP Ames Dura, PA-C Eula Listen, PA-C Cadence Troy, PA-C Charlsie Quest, NP Carlos Levering, NP

## 2024-03-19 ENCOUNTER — Telehealth: Payer: Self-pay | Admitting: Internal Medicine

## 2024-03-19 ENCOUNTER — Telehealth: Payer: Self-pay

## 2024-03-19 NOTE — Telephone Encounter (Signed)
 Copied from CRM [id number]. Topic: General - Other >> Mar 19, 2024  3:49 PM Martin[place] E wrote: Reason for CRM: Patient called in requesting a callback from Trish, regarding a mammogram. Patient stated she usually has these done at [facility], but now they want her scheduled at a different location. Callback number for patient is [phone] to clarify.

## 2024-03-20 ENCOUNTER — Other Ambulatory Visit: Payer: Self-pay

## 2024-03-20 DIAGNOSIS — Z1231 Encounter for screening mammogram for malignant neoplasm of breast: Secondary | ICD-10-CM

## 2024-03-20 NOTE — Telephone Encounter (Signed)
 Mammo has been ordered for [facility]. Will fax

## 2024-03-27 NOTE — Telephone Encounter (Signed)
Mammo ordered faxed.

## 2024-04-17 ENCOUNTER — Telehealth: Payer: Self-pay

## 2024-04-17 ENCOUNTER — Encounter

## 2024-04-17 NOTE — Telephone Encounter (Signed)
 Copied from CRM [id number]. Topic: General - Other >> Apr 17, 2024  2:18 PM Jenice Mitts wrote: Reason for CRM: Patient is calling because she has an uti again. She just wants to know if something can be prescribed instead of coming in to an appointment

## 2024-04-17 NOTE — Telephone Encounter (Signed)
 Pt scheduled with Dr Lorin Picket

## 2024-04-18 ENCOUNTER — Ambulatory Visit (INDEPENDENT_AMBULATORY_CARE_PROVIDER_SITE_OTHER): Admitting: Internal Medicine

## 2024-04-18 ENCOUNTER — Ambulatory Visit: Payer: Self-pay | Admitting: Internal Medicine

## 2024-04-18 ENCOUNTER — Encounter: Payer: Self-pay | Admitting: Internal Medicine

## 2024-04-18 VITALS — BP 130/76 | HR 89 | Temp 98.0°F | Resp 16 | Ht 64.0 in | Wt 192.6 lb

## 2024-04-18 DIAGNOSIS — J449 Chronic obstructive pulmonary disease, unspecified: Secondary | ICD-10-CM | POA: Diagnosis not present

## 2024-04-18 DIAGNOSIS — N3000 Acute cystitis without hematuria: Secondary | ICD-10-CM

## 2024-04-18 DIAGNOSIS — R32 Unspecified urinary incontinence: Secondary | ICD-10-CM | POA: Diagnosis not present

## 2024-04-18 DIAGNOSIS — F439 Reaction to severe stress, unspecified: Secondary | ICD-10-CM | POA: Diagnosis not present

## 2024-04-18 DIAGNOSIS — I10 Essential (primary) hypertension: Secondary | ICD-10-CM | POA: Diagnosis not present

## 2024-04-18 DIAGNOSIS — I4821 Permanent atrial fibrillation: Secondary | ICD-10-CM

## 2024-04-18 LAB — POCT URINALYSIS DIPSTICK
Bilirubin, UA: NEGATIVE
Glucose, UA: NEGATIVE
Ketones, UA: NEGATIVE
Nitrite, UA: NEGATIVE
Protein, UA: NEGATIVE
Spec Grav, UA: 1.015 (ref 1.010–1.025)
Urobilinogen, UA: 0.2 U/dL
pH, UA: 7 (ref 5.0–8.0)

## 2024-04-18 LAB — URINALYSIS, MICROSCOPIC ONLY

## 2024-04-18 MED ORDER — CEPHALEXIN 500 MG PO CAPS: 500.0000 mg | ORAL_CAPSULE | Freq: Two times a day (BID) | ORAL | 0 refills | Status: DC

## 2024-04-18 NOTE — Progress Notes (Unsigned)
 Subjective:    Patient ID: Danielle Yates, female    DOB: 1941-07-27, 83 y.o.   MRN: [id number]  Patient here for  Chief Complaint  Patient presents with   Urinary Tract Infection         HPI Work in appt. Work in with concerns regarding possible UTI. Reports symptoms started approximately one week ago. Reports dysuria. Some incontinence. No hematuria. No increased abdominal pain or change - back pain. No vomiting. Discussed staying hydrated. No fever. No vaginal symptoms reported.    Past Medical History:  Diagnosis Date   Allergy    Aortic valve sclerosis    a. TTE 2013: EF 60-65%, no RWMA, Gr1DD, trivial MR, mildly dilated LA 43 mm; b. TTE 08/2017: EF 65-70%, mild LVH, aortic valve sclerosis without stenosis, mildly dilated LA 36 mm, RV size and sys fxn nl   Arthropathy, unspecified, site unspecified    Basal cell carcinoma 06/15/2018   upper back right of midline/superficial   Cataract    Chronic rhinitis    Disorder of bone and cartilage, unspecified    Diverticulosis of colon (without mention of hemorrhage)    Essential hypertension    a. refractory HTN; b. renal US  2013 negative for RAS   External hemorrhoids without mention of complication    Medication intolerance    Obesity, unspecified    Other abnormal glucose    Panic disorder without agoraphobia    Peripheral arterial disease (HCC)    a.  Known bilateral common iliac disease-> medical therapy-asymptomatic.   Persistent atrial fibrillation (HCC)    a. diagnosed 10/19/17; b. CHADS2VASc 5 (HTN, age x 2, vascular disease, female)-->Eliquis    Pure hypercholesterolemia    Tobacco use disorder    Unspecified vitamin D  deficiency    Past Surgical History:  Procedure Laterality Date   APPENDECTOMY  1985   CHOLECYSTECTOMY  1985   with open wound   EYE SURGERY  2011   CATARACTS -  BOTH   Removed spot on face     [name]   TONSILLECTOMY AND ADENOIDECTOMY  1946   VAGINAL HYSTERECTOMY  1982   fibroid and  endometriosis; one ovary remaining   Family History  Problem Relation Age of Onset   Hypertension Sister    Heart disease Brother    Hypertension Brother    Hyperlipidemia Brother    Cancer Brother    Aneurysm Mother    Rheumatic fever Mother        as an adult   Heart disease Father        MI x 2   Social History   Socioeconomic History   Marital status: Widowed    Spouse name: Not on file   Number of children: 0   Years of education: college   Highest education level: Bachelor's degree (e.g., BA, AB, BS)  Occupational History   Occupation: Academic librarian    Comment: [employer] 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 Widowed after 23 years. Not dating. Has dog. Exercise: Walking; light has not been walking as she used to before her 2 dogs passed away. Tries to walk some. Caffeine: Carbonated beverages, 3 servings/day;Diet coke. 3 a day.      10/02/12  PER PATIENT'S LAVENDER FORM: WALK 5 DAYS/WEEK FOR 30 MINUTES   Social Drivers of Corporate investment banker Strain: Low Risk  (10/17/2023)   Overall Financial Resource Strain (CARDIA)    Difficulty of Paying Living Expenses: Not hard at all  Food Insecurity: Not on file  Transportation Needs: No Transportation Needs (10/17/2023)   PRAPARE - Administrator, Civil Service (Medical): No    Lack of Transportation (Non-Medical): No  Physical Activity: Unknown (10/17/2023)   Exercise Vital Sign    Days of Exercise per Week: 0 days    Minutes of Exercise per Session:  Not on file  Stress: No Stress Concern Present (10/17/2023)   [employer] of Occupational Health - Occupational Stress Questionnaire    Feeling of Stress : Only a little  Social Connections: Moderately Integrated (10/17/2023)   Social Connection and Isolation Panel [NHANES]    Frequency of Communication with Friends and Family: More than three times a week    Frequency of Social Gatherings with Friends and Family: Twice a week    Attends Religious Services: More than 4 times per year    Active Member of [employer] or Organizations: Yes    Attends Banker Meetings: 1 to 4 times per year    Marital Status: Widowed     Review of Systems  Constitutional:  Negative for appetite change, fever and unexpected weight change.  HENT:  Negative for congestion and sinus pressure.   Respiratory:  Negative for cough and chest tightness.        Breathing stable.   Cardiovascular:  Negative for chest pain, palpitations and leg swelling.  Gastrointestinal:  Negative for abdominal pain, diarrhea, nausea and vomiting.  Genitourinary:  Positive for dysuria. Negative for hematuria.  Musculoskeletal:  Negative for joint swelling and myalgias.  Skin:  Negative for color change and rash.  Neurological:  Negative for dizziness and headaches.  Psychiatric/Behavioral:  Negative for agitation and dysphoric mood.        Objective:     BP 130/76   Pulse 89   Temp 98 F (36.7 C)   Resp 16   Ht 5\' 4"  (1.626 m)   Wt 192 lb 9.6 oz (87.4 kg)   SpO2 98%   BMI 33.06 kg/m  Wt Readings from Last 3 Encounters:  04/18/24 192 lb 9.6 oz (87.4 kg)  03/12/24 194 lb 6.4 oz (88.2 kg)  02/15/24 198 lb (89.8 kg)    Physical Exam Vitals reviewed.  Constitutional:      General: She is not in acute distress.    Appearance: Normal appearance.  HENT:     Head: Normocephalic and atraumatic.     Right Ear: External ear normal.     Left Ear: External ear normal.  Eyes:     General: No scleral icterus.        Right eye: No discharge.        Left eye: No discharge.     Conjunctiva/sclera: Conjunctivae normal.  Neck:     Thyroid : No thyromegaly.  Cardiovascular:     Rate and Rhythm: Normal rate and regular rhythm.  Pulmonary:     Effort: No respiratory distress.  Breath sounds: Normal breath sounds. No wheezing.  Abdominal:     General: Bowel sounds are normal.     Palpations: Abdomen is soft.     Tenderness: There is no abdominal tenderness.  Musculoskeletal:        General: No swelling or tenderness.     Cervical back: Neck supple. No tenderness.  Lymphadenopathy:     Cervical: No cervical adenopathy.  Skin:    Findings: No erythema or rash.  Neurological:     Mental Status: She is alert.  Psychiatric:        Mood and Affect: Mood normal.        Behavior: Behavior normal.         Outpatient Encounter Medications as of 04/18/2024  Medication Sig   cephALEXin (KEFLEX) 500 MG capsule Take 1 capsule (500 mg total) by mouth 2 (two) times daily.   albuterol  (VENTOLIN  HFA) 108 (90 Base) MCG/ACT inhaler INHALE 2 PUFFS INTO THE LUNGS EVERY 6 HOURS AS NEEDED FOR WHEEZING OR SHORTNESS OF BREATH   apixaban  (ELIQUIS ) 5 MG TABS tablet Take 5 mg by mouth 2 (two) times daily.   Cholecalciferol 125 MCG (5000 UT) capsule Take 5,000 Units by mouth daily.   diltiazem  (TIADYLT  ER) 360 MG 24 hr capsule TAKE 1 CAPSULE BY MOUTH EVERY DAY   diltiazem  (TIADYLT  ER) 360 MG 24 hr capsule Take 1 capsule by mouth daily.   ELIQUIS  5 MG TABS tablet TAKE 1 TABLET BY MOUTH TWICE A DAY   enalapril  (VASOTEC ) 20 MG tablet TAKE 1 TABLET BY MOUTH IN THE MORNING AND 2 TABLETS IN THE AFTERNOON.   FLUoxetine  (PROZAC ) 20 MG capsule Take 1 capsule (20 mg total) by mouth daily.   fluticasone  (FLONASE ) 50 MCG/ACT nasal spray SHAKE LIQUID AND USE 2 SPRAYS IN EACH NOSTRIL DAILY   ipratropium (ATROVENT  HFA) 17 MCG/ACT inhaler Inhale 2 puffs into the lungs every 6 (six) hours as needed for wheezing.   Multiple  Vitamins-Minerals (PRESERVISION AREDS) CAPS Take 1 capsule by mouth daily.   mupirocin  ointment (BACTROBAN ) 2 % Apply 1 Application topically 2 (two) times daily.   pravastatin  (PRAVACHOL ) 10 MG tablet TAKE 1 TABLET BY MOUTH EVERY DAY   Probiotic Product (PROBIOTIC DAILY PO) Take 1 tablet by mouth.   triamterene -hydrochlorothiazide (MAXZIDE-25) 37.5-25 MG tablet TAKE 1 TABLET BY MOUTH EVERY DAY   VITAMIN D , CHOLECALCIFEROL, PO Take by mouth daily.   No facility-administered encounter medications on file as of 04/18/2024.     Lab Results  Component Value Date   WBC 5.9 02/10/2024   HGB 15.5 02/10/2024   HCT 47.0 (H) 02/10/2024   PLT 259 02/10/2024   GLUCOSE 105 (H) 02/10/2024   CHOL 167 02/10/2024   TRIG 79 02/10/2024   HDL 63 02/10/2024   LDLCALC 89 02/10/2024   ALT 11 02/10/2024   AST 18 02/10/2024   NA 139 02/10/2024   K 3.8 02/10/2024   CL 98 02/10/2024   CREATININE 0.96 02/10/2024   BUN 14 02/10/2024   CO2 26 02/10/2024   TSH 2.690 02/10/2024   HGBA1C 5.6 02/10/2024       Assessment & Plan:  Urinary incontinence, unspecified type -     POCT urinalysis dipstick -     Urine Microscopic -     Urine Culture  Acute cystitis without hematuria Assessment & Plan: Presents with dysuria. Urine dip - trace leukocytes and trace blood. Urine culture pending. Will treat with keflex 500mg  as directed. Stay hydrated. Follow.  Await culture results.    Permanent atrial fibrillation (HCC) Assessment & Plan: No increased heart rate or palpitations.  Continue eliquis .  On diltiazem .  Follow. No changes today.    Chronic obstructive pulmonary disease, unspecified COPD type (HCC) Assessment & Plan: Breathing stable.    Essential hypertension, benign Assessment & Plan: Continue diltiazem  and enalapril . Follow pressures.    Stress Assessment & Plan: Discussed. Overall appears to be handling things well. Follow.    Other orders -     Cephalexin; Take 1 capsule (500 mg  total) by mouth 2 (two) times daily.  Dispense: 10 capsule; Refill: 0     Dellar Fenton, MD

## 2024-04-19 LAB — URINE CULTURE
MICRO NUMBER:: 16454795
SPECIMEN QUALITY:: ADEQUATE

## 2024-04-20 ENCOUNTER — Encounter: Payer: Self-pay | Admitting: Internal Medicine

## 2024-04-20 ENCOUNTER — Telehealth: Payer: Self-pay

## 2024-04-20 DIAGNOSIS — R319 Hematuria, unspecified: Secondary | ICD-10-CM

## 2024-04-20 DIAGNOSIS — N390 Urinary tract infection, site not specified: Secondary | ICD-10-CM | POA: Insufficient documentation

## 2024-04-20 NOTE — Assessment & Plan Note (Signed)
 No increased heart rate or palpitations.  Continue eliquis.  On diltiazem.  Follow. No changes today.

## 2024-04-20 NOTE — Assessment & Plan Note (Signed)
 Discussed.  Overall appears to be handling things well.  Follow.

## 2024-04-20 NOTE — Progress Notes (Signed)
 Notify urine culture is negative for infection.  She was seen 04/18/24 and placed on keflex - with concern regarding UTI. Her culture is negative. Please confirm how she is doing.  Have symptoms improved/resolved?  The microscopic exam (urine) - did reveal some red blood cells. Need a repeat urinalysis with micro - in 10-14 days to confirm if red blood cells clear.

## 2024-04-20 NOTE — Telephone Encounter (Signed)
 [facility] urine ordered

## 2024-04-20 NOTE — Assessment & Plan Note (Signed)
 Presents with dysuria. Urine dip - trace leukocytes and trace blood. Urine culture pending. Will treat with keflex 500mg  as directed. Stay hydrated. Follow.  Await culture results.

## 2024-04-20 NOTE — Assessment & Plan Note (Signed)
 Breathing stable.

## 2024-04-20 NOTE — Assessment & Plan Note (Signed)
Continue diltiazem and enalapril.  Follow pressures.

## 2024-04-28 ENCOUNTER — Other Ambulatory Visit: Payer: Self-pay | Admitting: Cardiovascular Disease

## 2024-05-07 ENCOUNTER — Other Ambulatory Visit: Payer: Self-pay | Admitting: Internal Medicine

## 2024-05-07 DIAGNOSIS — Z7689 Persons encountering health services in other specified circumstances: Secondary | ICD-10-CM | POA: Diagnosis not present

## 2024-05-08 ENCOUNTER — Ambulatory Visit: Payer: Self-pay | Admitting: Internal Medicine

## 2024-05-08 LAB — URINALYSIS, ROUTINE W REFLEX MICROSCOPIC
Bilirubin, UA: NEGATIVE
Glucose, UA: NEGATIVE
Ketones, UA: NEGATIVE
Leukocytes,UA: NEGATIVE
Nitrite, UA: NEGATIVE
Protein,UA: NEGATIVE
RBC, UA: NEGATIVE
Specific Gravity, UA: 1.012 (ref 1.005–1.030)
Urobilinogen, Ur: 0.2 mg/dL (ref 0.2–1.0)
pH, UA: 7 (ref 5.0–7.5)

## 2024-05-16 ENCOUNTER — Other Ambulatory Visit: Payer: Self-pay | Admitting: Cardiovascular Disease

## 2024-05-30 NOTE — Telephone Encounter (Signed)
 Copied from CRM [id number]. Topic: Clinical - Lab/Test Results >> May 30, 2024  8:09 AM Thersia BROCKS wrote: Reason for CRM: Patient called back regarding missed call, relay lab results . Patient stated she understood and had no further questions

## 2024-06-02 ENCOUNTER — Other Ambulatory Visit: Payer: Self-pay | Admitting: Cardiovascular Disease

## 2024-06-04 ENCOUNTER — Ambulatory Visit: Admitting: Podiatry

## 2024-06-18 ENCOUNTER — Ambulatory Visit: Admitting: Internal Medicine

## 2024-06-18 ENCOUNTER — Other Ambulatory Visit: Payer: Self-pay | Admitting: Cardiovascular Disease

## 2024-06-18 DIAGNOSIS — I4821 Permanent atrial fibrillation: Secondary | ICD-10-CM

## 2024-06-18 NOTE — Telephone Encounter (Signed)
 Prescription refill request for Eliquis  received. Indication: AF Last office visit: 03/12/24  R Dunn PA-C Scr: 0.96 on 02/10/24  Epic Age: 83 Weight: 88.2kg  Based on above findings Eliquis  5mg  twice daily is the appropriate dose.  Refill approved.

## 2024-06-21 DIAGNOSIS — I10 Essential (primary) hypertension: Secondary | ICD-10-CM | POA: Diagnosis not present

## 2024-06-21 DIAGNOSIS — E7800 Pure hypercholesterolemia, unspecified: Secondary | ICD-10-CM | POA: Diagnosis not present

## 2024-06-21 DIAGNOSIS — R739 Hyperglycemia, unspecified: Secondary | ICD-10-CM | POA: Diagnosis not present

## 2024-06-22 ENCOUNTER — Ambulatory Visit: Payer: Self-pay | Admitting: Internal Medicine

## 2024-06-22 LAB — HEMOGLOBIN A1C
Est. average glucose Bld gHb Est-mCnc: 108 mg/dL
Hgb A1c MFr Bld: 5.4 % (ref 4.8–5.6)

## 2024-06-22 LAB — HEPATIC FUNCTION PANEL
ALT: 10 IU/L (ref 0–32)
AST: 16 IU/L (ref 0–40)
Albumin: 4.1 g/dL (ref 3.7–4.7)
Alkaline Phosphatase: 59 IU/L (ref 44–121)
Bilirubin Total: 1.1 mg/dL (ref 0.0–1.2)
Bilirubin, Direct: 0.36 mg/dL (ref 0.00–0.40)
Total Protein: 6.8 g/dL (ref 6.0–8.5)

## 2024-06-22 LAB — BASIC METABOLIC PANEL WITH GFR
BUN/Creatinine Ratio: 15 (ref 12–28)
BUN: 13 mg/dL (ref 8–27)
CO2: 21 mmol/L (ref 20–29)
Calcium: 9.8 mg/dL (ref 8.7–10.3)
Chloride: 98 mmol/L (ref 96–106)
Creatinine, Ser: 0.87 mg/dL (ref 0.57–1.00)
Glucose: 100 mg/dL — ABNORMAL HIGH (ref 70–99)
Potassium: 3.8 mmol/L (ref 3.5–5.2)
Sodium: 139 mmol/L (ref 134–144)
eGFR: 66 mL/min/1.73 (ref >59)

## 2024-06-22 LAB — LIPID PANEL
Chol/HDL Ratio: 2.6 ratio (ref 0.0–4.4)
Cholesterol, Total: 158 mg/dL (ref 100–199)
HDL: 61 mg/dL (ref >39)
LDL Chol Calc (NIH): 79 mg/dL (ref 0–99)
Triglycerides: 99 mg/dL (ref 0–149)
VLDL Cholesterol Cal: 18 mg/dL (ref 5–40)

## 2024-06-27 ENCOUNTER — Ambulatory Visit

## 2024-06-27 ENCOUNTER — Ambulatory Visit (INDEPENDENT_AMBULATORY_CARE_PROVIDER_SITE_OTHER): Admitting: Internal Medicine

## 2024-06-27 VITALS — BP 118/72 | HR 87 | Resp 16 | Ht 64.0 in | Wt 193.2 lb

## 2024-06-27 DIAGNOSIS — I10 Essential (primary) hypertension: Secondary | ICD-10-CM | POA: Diagnosis not present

## 2024-06-27 DIAGNOSIS — D6869 Other thrombophilia: Secondary | ICD-10-CM

## 2024-06-27 DIAGNOSIS — R634 Abnormal weight loss: Secondary | ICD-10-CM

## 2024-06-27 DIAGNOSIS — R739 Hyperglycemia, unspecified: Secondary | ICD-10-CM

## 2024-06-27 DIAGNOSIS — I4821 Permanent atrial fibrillation: Secondary | ICD-10-CM | POA: Diagnosis not present

## 2024-06-27 DIAGNOSIS — I700 Atherosclerosis of aorta: Secondary | ICD-10-CM | POA: Diagnosis not present

## 2024-06-27 DIAGNOSIS — F419 Anxiety disorder, unspecified: Secondary | ICD-10-CM

## 2024-06-27 DIAGNOSIS — I739 Peripheral vascular disease, unspecified: Secondary | ICD-10-CM

## 2024-06-27 DIAGNOSIS — M954 Acquired deformity of chest and rib: Secondary | ICD-10-CM

## 2024-06-27 DIAGNOSIS — Z720 Tobacco use: Secondary | ICD-10-CM

## 2024-06-27 DIAGNOSIS — E7800 Pure hypercholesterolemia, unspecified: Secondary | ICD-10-CM

## 2024-06-27 DIAGNOSIS — J449 Chronic obstructive pulmonary disease, unspecified: Secondary | ICD-10-CM

## 2024-06-27 MED ORDER — FLUOXETINE HCL 20 MG PO CAPS: 20.0000 mg | ORAL_CAPSULE | Freq: Every day | ORAL | 1 refills | Status: AC

## 2024-06-27 NOTE — Progress Notes (Signed)
 Subjective:    Patient ID: Danielle Yates, female    DOB: 30-May-1941, 83 y.o.   MRN: [id number]  Patient here for  Chief Complaint  Patient presents with   Medical Management of Chronic Issues    HPI Here for a scheduled follow up - follow up regarding afib - on eliquis  and diltiazem , hypertension, COPD, hyperglycemia and hypercholesterolemia. Breathing stable. No increased cough. No abdominal pain or bowel change reported. Weight stable since 03/2024. Prior to that - reported unintentional weight lss.    Past Medical History:  Diagnosis Date   Allergy    Aortic valve sclerosis    a. TTE 2013: EF 60-65%, no RWMA, Gr1DD, trivial MR, mildly dilated LA 43 mm; b. TTE 08/2017: EF 65-70%, mild LVH, aortic valve sclerosis without stenosis, mildly dilated LA 36 mm, RV size and sys fxn nl   Arthropathy, unspecified, site unspecified    Basal cell carcinoma 06/15/2018   upper back right of midline/superficial   Cataract    Chronic rhinitis    Disorder of bone and cartilage, unspecified    Diverticulosis of colon (without mention of hemorrhage)    Essential hypertension    a. refractory HTN; b. renal US  2013 negative for RAS   External hemorrhoids without mention of complication    Medication intolerance    Obesity, unspecified    Other abnormal glucose    Panic disorder without agoraphobia    Peripheral arterial disease (HCC)    a.  Known bilateral common iliac disease-> medical therapy-asymptomatic.   Persistent atrial fibrillation (HCC)    a. diagnosed 10/19/17; b. CHADS2VASc 5 (HTN, age x 2, vascular disease, female)-->Eliquis    Pure hypercholesterolemia    Tobacco use disorder    Unspecified vitamin D  deficiency    Past Surgical History:  Procedure Laterality Date   APPENDECTOMY  1985   CHOLECYSTECTOMY  1985   with open wound   EYE SURGERY  2011   CATARACTS -  BOTH   Removed spot on face     [name]   TONSILLECTOMY AND ADENOIDECTOMY  1946   VAGINAL HYSTERECTOMY  1982    fibroid and endometriosis; one ovary remaining   Family History  Problem Relation Age of Onset   Hypertension Sister    Heart disease Brother    Hypertension Brother    Hyperlipidemia Brother    Cancer Brother    Aneurysm Mother    Rheumatic fever Mother        as an adult   Heart disease Father        MI x 2   Social History   Socioeconomic History   Marital status: Widowed    Spouse name: Not on file   Number of children: 0   Years of education: college   Highest education level: Bachelor's degree (e.g., BA, AB, BS)  Occupational History   Occupation: Academic librarian    Comment: [employer]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 Widowed after 23 years. Not dating. Has dog. Exercise: Walking; light has not been walking as she used to before her 2 dogs passed away. Tries to walk some. Caffeine: Carbonated beverages, 3 servings/day;Diet coke. 3 a day.      10/02/12  PER PATIENT'S LAVENDER FORM: WALK 5 DAYS/WEEK FOR 30 MINUTES   Social Drivers of Corporate investment banker Strain: Low Risk  (10/17/2023)   Overall Financial Resource Strain (CARDIA)    Difficulty of Paying Living Expenses: Not hard at all  Food Insecurity: Not on file  Transportation Needs: No Transportation Needs (10/17/2023)   PRAPARE - Administrator, Civil Service (Medical): No    Lack of Transportation (Non-Medical): No  Physical Activity: Unknown (10/17/2023)   Exercise Vital Sign    Days of Exercise per Week: 0 days    Minutes of Exercise  per Session: Not on file  Stress: No Stress Concern Present (10/17/2023)   [employer] of Occupational Health - Occupational Stress Questionnaire    Feeling of Stress : Only a little  Social Connections: Moderately Integrated (10/17/2023)   Social Connection and Isolation Panel    Frequency of Communication with Friends and Family: More than three times a week    Frequency of Social Gatherings with Friends and Family: Twice a week    Attends Religious Services: More than 4 times per year    Active Member of [employer] or Organizations: Yes    Attends Banker Meetings: 1 to 4 times per year    Marital Status: Widowed     Review of Systems  Constitutional:  Negative for appetite change and unexpected weight change.  HENT:  Negative for congestion and sinus pressure.   Respiratory:  Negative for cough, chest tightness and shortness of breath.   Cardiovascular:  Negative for chest pain, palpitations and leg swelling.  Gastrointestinal:  Negative for abdominal pain, diarrhea, nausea and vomiting.  Genitourinary:  Negative for difficulty urinating and dysuria.  Musculoskeletal:  Negative for joint swelling and myalgias.  Skin:  Negative for color change and rash.  Neurological:  Negative for dizziness and headaches.  Psychiatric/Behavioral:  Negative for agitation and dysphoric mood.        Objective:     BP 118/72   Pulse 87   Resp 16   Ht 5' 4 (1.626 m)   Wt 193 lb 3.2 oz (87.6 kg)   SpO2 97%   BMI 33.16 kg/m  Wt Readings from Last 3 Encounters:  06/27/24 193 lb 3.2 oz (87.6 kg)  04/18/24 192 lb 9.6 oz (87.4 kg)  03/12/24 194 lb 6.4 oz (88.2 kg)    Physical Exam Vitals reviewed.  Constitutional:      General: She is not in acute distress.    Appearance: Normal appearance.  HENT:     Head: Normocephalic and atraumatic.     Right Ear: External ear normal.     Left Ear: External ear normal.     Mouth/Throat:     Pharynx: No oropharyngeal exudate or  posterior oropharyngeal erythema.  Eyes:     General: No scleral icterus.       Right eye: No discharge.        Left eye: No discharge.     Conjunctiva/sclera: Conjunctivae normal.  Neck:     Thyroid : No thyromegaly.  Cardiovascular:     Rate and Rhythm: Normal rate and regular rhythm.  Pulmonary:     Effort:  No respiratory distress.     Breath sounds: Normal breath sounds. No wheezing.  Abdominal:     General: Bowel sounds are normal.     Palpations: Abdomen is soft.     Tenderness: There is no abdominal tenderness.  Musculoskeletal:        General: No swelling or tenderness.     Cervical back: Neck supple. No tenderness.  Lymphadenopathy:     Cervical: No cervical adenopathy.  Skin:    Findings: No erythema or rash.  Neurological:     Mental Status: She is alert.  Psychiatric:        Mood and Affect: Mood normal.        Behavior: Behavior normal.         Outpatient Encounter Medications as of 06/27/2024  Medication Sig   albuterol  (VENTOLIN  HFA) 108 (90 Base) MCG/ACT inhaler INHALE 2 PUFFS INTO THE LUNGS EVERY 6 HOURS AS NEEDED FOR WHEEZING OR SHORTNESS OF BREATH   Cholecalciferol 125 MCG (5000 UT) capsule Take 5,000 Units by mouth daily.   diltiazem  (TIADYLT  ER) 360 MG 24 hr capsule TAKE 1 CAPSULE BY MOUTH EVERY DAY   ELIQUIS  5 MG TABS tablet TAKE 1 TABLET BY MOUTH TWICE A DAY   enalapril  (VASOTEC ) 20 MG tablet TAKE 1 TABLET BY MOUTH IN THE MORNING AND 2 TABLETS IN THE AFTERNOON.   FLUoxetine  (PROZAC ) 20 MG capsule Take 1 capsule (20 mg total) by mouth daily.   fluticasone  (FLONASE ) 50 MCG/ACT nasal spray SHAKE LIQUID AND USE 2 SPRAYS IN EACH NOSTRIL DAILY   ipratropium (ATROVENT  HFA) 17 MCG/ACT inhaler Inhale 2 puffs into the lungs every 6 (six) hours as needed for wheezing.   Multiple Vitamins-Minerals (PRESERVISION AREDS) CAPS Take 1 capsule by mouth daily.   mupirocin  ointment (BACTROBAN ) 2 % Apply 1 Application topically 2 (two) times daily.   pravastatin   (PRAVACHOL ) 10 MG tablet TAKE 1 TABLET BY MOUTH EVERY DAY   Probiotic Product (PROBIOTIC DAILY PO) Take 1 tablet by mouth.   triamterene -hydrochlorothiazide (MAXZIDE-25) 37.5-25 MG tablet TAKE 1 TABLET BY MOUTH EVERY DAY   VITAMIN D , CHOLECALCIFEROL, PO Take by mouth daily.   [DISCONTINUED] apixaban  (ELIQUIS ) 5 MG TABS tablet Take 5 mg by mouth 2 (two) times daily.   [DISCONTINUED] cephALEXin  (KEFLEX ) 500 MG capsule Take 1 capsule (500 mg total) by mouth 2 (two) times daily.   [DISCONTINUED] diltiazem  (TIADYLT  ER) 360 MG 24 hr capsule Take 1 capsule by mouth daily.   [DISCONTINUED] FLUoxetine  (PROZAC ) 20 MG capsule Take 1 capsule (20 mg total) by mouth daily.   No facility-administered encounter medications on file as of 06/27/2024.     Lab Results  Component Value Date   WBC 5.9 02/10/2024   HGB 15.5 02/10/2024   HCT 47.0 (H) 02/10/2024   PLT 259 02/10/2024   GLUCOSE 100 (H) 06/21/2024   CHOL 158 06/21/2024   TRIG 99 06/21/2024   HDL 61 06/21/2024   LDLCALC 79 06/21/2024   ALT 10 06/21/2024   AST 16 06/21/2024   NA 139 06/21/2024   K 3.8 06/21/2024   CL 98 06/21/2024   CREATININE 0.87 06/21/2024   BUN 13 06/21/2024   CO2 21 06/21/2024   TSH 2.690 02/10/2024   HGBA1C 5.4 06/21/2024       Assessment & Plan:  Deformity of sternum Assessment & Plan: Persistent protuberance - sternum. Given persistence, will check xray.   Orders: -     DG Sternum; Future  Pure hypercholesterolemia Assessment & Plan:  Continues on pravastatin . Low cholesterol diet and exercise. Had intolerance to other statin medications and zetia . Follow lipid panel.  Lab Results  Component Value Date   CHOL 158 06/21/2024   HDL 61 06/21/2024   LDLCALC 79 06/21/2024   TRIG 99 06/21/2024   CHOLHDL 2.6 06/21/2024     Orders: -     Lipid panel -     Hepatic function panel  Essential hypertension, benign Assessment & Plan: Continue diltiazem  and enalapril . Follow pressures. No changes in  medication. Follow metabolic panel.   Orders: -     Basic metabolic panel with GFR  Hyperglycemia Assessment & Plan: Low carb diet and exercise. Follow met b and A1c.  Orders: -     Hemoglobin A1c  Acquired thrombophilia (HCC) Assessment & Plan: Afib, continue eliquis .    Anxiety Assessment & Plan: Overall appears to be doing relatively well. Continue prozac .    Aortic atherosclerosis (HCC) Assessment & Plan: Continue pravastatin .    Permanent atrial fibrillation (HCC) Assessment & Plan: Continue eliquis . Continue diltiazem . Stable. No increased heart rate or palpitations. Follow. No changes in medication today.    Chronic obstructive pulmonary disease, unspecified COPD type (HCC) Assessment & Plan: Breathing stable.    PAD (peripheral artery disease) (HCC) Assessment & Plan: Affecting common iliac arteries mainly.  Continue risk factor modification.  Continue pravastatin .    Tobacco abuse Assessment & Plan: Have discussed the need to quit smoking.    Weight loss Assessment & Plan: Stable from April check. Eating. Check xray as outlined. Further w/up pending results. Follow weight.    Other orders -     FLUoxetine  HCl; Take 1 capsule (20 mg total) by mouth daily.  Dispense: 90 capsule; Refill: 1     Allena Hamilton, MD

## 2024-07-01 ENCOUNTER — Encounter: Payer: Self-pay | Admitting: Internal Medicine

## 2024-07-01 DIAGNOSIS — R634 Abnormal weight loss: Secondary | ICD-10-CM | POA: Insufficient documentation

## 2024-07-01 NOTE — Assessment & Plan Note (Signed)
 Continue eliquis . Continue diltiazem . Stable. No increased heart rate or palpitations. Follow. No changes in medication today.

## 2024-07-01 NOTE — Assessment & Plan Note (Signed)
 Continue diltiazem  and enalapril . Follow pressures. No changes in medication. Follow metabolic panel.

## 2024-07-01 NOTE — Assessment & Plan Note (Signed)
 Continues on pravastatin . Low cholesterol diet and exercise. Had intolerance to other statin medications and zetia . Follow lipid panel.  Lab Results  Component Value Date   CHOL 158 06/21/2024   HDL 61 06/21/2024   LDLCALC 79 06/21/2024   TRIG 99 06/21/2024   CHOLHDL 2.6 06/21/2024

## 2024-07-01 NOTE — Assessment & Plan Note (Signed)
 Afib, continue eliquis .

## 2024-07-01 NOTE — Assessment & Plan Note (Signed)
 Breathing stable.

## 2024-07-01 NOTE — Assessment & Plan Note (Signed)
 Low-carb diet and exercise.  Follow met b and A1c.

## 2024-07-01 NOTE — Assessment & Plan Note (Signed)
 Overall appears to be doing relatively well. Continue prozac .

## 2024-07-01 NOTE — Assessment & Plan Note (Signed)
Affecting common iliac arteries mainly.  Continue risk factor modification.  Continue pravastatin.

## 2024-07-01 NOTE — Assessment & Plan Note (Signed)
 Persistent protuberance - sternum. Given persistence, will check xray.

## 2024-07-01 NOTE — Assessment & Plan Note (Signed)
Have discussed the need to quit smoking.   

## 2024-07-01 NOTE — Assessment & Plan Note (Signed)
 Stable from April check. Eating. Check xray as outlined. Further w/up pending results. Follow weight.

## 2024-07-01 NOTE — Assessment & Plan Note (Signed)
 Continue pravastatin

## 2024-07-02 ENCOUNTER — Ambulatory Visit: Admitting: Podiatry

## 2024-07-02 ENCOUNTER — Telehealth: Payer: Self-pay

## 2024-07-02 DIAGNOSIS — B351 Tinea unguium: Secondary | ICD-10-CM | POA: Diagnosis not present

## 2024-07-02 DIAGNOSIS — M79676 Pain in unspecified toe(s): Secondary | ICD-10-CM | POA: Diagnosis not present

## 2024-07-02 NOTE — Telephone Encounter (Signed)
 Copied from CRM [id number]. Topic: Clinical - Lab/Test Results >> Jul 02, 2024 12:25 PM Chiquita SQUIBB wrote: Reason for CRM: Patient is calling in asking for the Xray results, patient would like a phone call with the results when they become available as her rhona is not working.

## 2024-07-02 NOTE — Telephone Encounter (Signed)
 Xray has not resulted yet.

## 2024-07-02 NOTE — Progress Notes (Signed)
 Subjective:  Patient ID: Danielle Yates, female    DOB: 07/06/1941,  MRN: [id number] HPI Chief Complaint  Patient presents with   Ingrown Toenail    Left 1st toenail is elongated and thick. Some pain as well. Wonders if it could be ingrown. She had to stop getting pedicures and has had trouble trimming her own nails. Not diabetic and takes Eliquis .     83 y.o. female presents with the above complaint.   ROS: Denies fever chills nausea muscle aches pains calf pain back pain chest pain shortness of breath.  Past Medical History:  Diagnosis Date   Allergy    Aortic valve sclerosis    a. TTE 2013: EF 60-65%, no RWMA, Gr1DD, trivial MR, mildly dilated LA 43 mm; b. TTE 08/2017: EF 65-70%, mild LVH, aortic valve sclerosis without stenosis, mildly dilated LA 36 mm, RV size and sys fxn nl   Arthropathy, unspecified, site unspecified    Basal cell carcinoma 06/15/2018   upper back right of midline/superficial   Cataract    Chronic rhinitis    Disorder of bone and cartilage, unspecified    Diverticulosis of colon (without mention of hemorrhage)    Essential hypertension    a. refractory HTN; b. renal US  2013 negative for RAS   External hemorrhoids without mention of complication    Medication intolerance    Obesity, unspecified    Other abnormal glucose    Panic disorder without agoraphobia    Peripheral arterial disease (HCC)    a.  Known bilateral common iliac disease-> medical therapy-asymptomatic.   Persistent atrial fibrillation (HCC)    a. diagnosed 10/19/17; b. CHADS2VASc 5 (HTN, age x 2, vascular disease, female)-->Eliquis    Pure hypercholesterolemia    Tobacco use disorder    Unspecified vitamin D  deficiency    Past Surgical History:  Procedure Laterality Date   APPENDECTOMY  1985   CHOLECYSTECTOMY  1985   with open wound   EYE SURGERY  2011   CATARACTS -  BOTH   Removed spot on face     [name]   TONSILLECTOMY AND ADENOIDECTOMY  1946   VAGINAL HYSTERECTOMY  1982    fibroid and endometriosis; one ovary remaining    Current Outpatient Medications:    albuterol  (VENTOLIN  HFA) 108 (90 Base) MCG/ACT inhaler, INHALE 2 PUFFS INTO THE LUNGS EVERY 6 HOURS AS NEEDED FOR WHEEZING OR SHORTNESS OF BREATH, Disp: 8.5 g, Rfl: 2   Cholecalciferol 125 MCG (5000 UT) capsule, Take 5,000 Units by mouth daily., Disp: , Rfl:    diltiazem  (TIADYLT  ER) 360 MG 24 hr capsule, TAKE 1 CAPSULE BY MOUTH EVERY DAY, Disp: 90 capsule, Rfl: 3   ELIQUIS  5 MG TABS tablet, TAKE 1 TABLET BY MOUTH TWICE A DAY, Disp: 180 tablet, Rfl: 1   enalapril  (VASOTEC ) 20 MG tablet, TAKE 1 TABLET BY MOUTH IN THE MORNING AND 2 TABLETS IN THE AFTERNOON., Disp: 270 tablet, Rfl: 3   FLUoxetine  (PROZAC ) 20 MG capsule, Take 1 capsule (20 mg total) by mouth daily., Disp: 90 capsule, Rfl: 1   fluticasone  (FLONASE ) 50 MCG/ACT nasal spray, SHAKE LIQUID AND USE 2 SPRAYS IN EACH NOSTRIL DAILY, Disp: 48 mL, Rfl: 2   ipratropium (ATROVENT  HFA) 17 MCG/ACT inhaler, Inhale 2 puffs into the lungs every 6 (six) hours as needed for wheezing., Disp: 1 each, Rfl: 12   Multiple Vitamins-Minerals (PRESERVISION AREDS) CAPS, Take 1 capsule by mouth daily., Disp: , Rfl:    pravastatin  (PRAVACHOL ) 10 MG tablet, TAKE 1  TABLET BY MOUTH EVERY DAY, Disp: 90 tablet, Rfl: 3   Probiotic Product (PROBIOTIC DAILY PO), Take 1 tablet by mouth., Disp: , Rfl:    triamterene -hydrochlorothiazide (MAXZIDE-25) 37.5-25 MG tablet, TAKE 1 TABLET BY MOUTH EVERY DAY, Disp: 90 tablet, Rfl: 3   VITAMIN D , CHOLECALCIFEROL, PO, Take by mouth daily., Disp: , Rfl:   Allergies  Allergen Reactions   Amlodipine      Headache/dizziness   Codeine Nausea Only   Coreg  [Carvedilol ]     Headache/dizziness   Crestor [Rosuvastatin] Other (See Comments)    PER PT MADE ME FEEL WEIRD   Metoprolol      bradycardia   Statins     Tolerates low dose pravastatin .   Nickel Rash   Review of Systems Objective:  There were no vitals filed for this visit.  General: Well  developed, nourished, in no acute distress, alert and oriented x3   Dermatological: Skin is warm, dry and supple bilateral. Nails x 10 are very long sharply incurvated thickened discolored.  Most likely nail dystrophy.  Remaining integument appears unremarkable at this time. There are no open sores, no preulcerative lesions, no rash or signs of infection present.  Vascular: Dorsalis Pedis artery and Posterior Tibial artery pedal pulses are 2/4 bilateral with immedate capillary fill time. Pedal hair growth present. No varicosities and no lower extremity edema present bilateral.   Neruologic: Grossly intact via light touch bilateral. Vibratory intact via tuning fork bilateral. Protective threshold with Semmes Wienstein monofilament intact to all pedal sites bilateral. Patellar and Achilles deep tendon reflexes 2+ bilateral. No Babinski or clonus noted bilateral.   Musculoskeletal: No gross boney pedal deformities bilateral. No pain, crepitus, or limitation noted with foot and ankle range of motion bilateral. Muscular strength 5/5 in all groups tested bilateral.  Gait: Unassisted, Nonantalgic.    Radiographs:  None taken  Assessment & Plan:   Assessment: Pain secondary to onychomycosis nail dystrophy.  Plan: Debrided nails 1 through 5 bilateral.     Tracie Dore T. [place], [place]

## 2024-07-04 ENCOUNTER — Encounter: Payer: Self-pay | Admitting: Internal Medicine

## 2024-07-04 ENCOUNTER — Ambulatory Visit: Payer: Self-pay | Admitting: Internal Medicine

## 2024-07-04 DIAGNOSIS — Z1231 Encounter for screening mammogram for malignant neoplasm of breast: Secondary | ICD-10-CM | POA: Diagnosis not present

## 2024-07-04 LAB — HM MAMMOGRAPHY

## 2024-07-04 NOTE — Telephone Encounter (Signed)
 Copied from CRM [id number]. Topic: Clinical - Lab/Test Results >> Jul 04, 2024 10:20 AM Burnard DEL wrote: Reason for CRM: Patient returned call to CMA regarding xray results. Message was relayed to patient from provider.Patient verbalized understanding.

## 2024-08-23 ENCOUNTER — Encounter: Payer: Self-pay | Admitting: Dermatology

## 2024-08-23 ENCOUNTER — Ambulatory Visit: Payer: PPO | Admitting: Dermatology

## 2024-08-23 DIAGNOSIS — Z85828 Personal history of other malignant neoplasm of skin: Secondary | ICD-10-CM

## 2024-08-23 DIAGNOSIS — L578 Other skin changes due to chronic exposure to nonionizing radiation: Secondary | ICD-10-CM | POA: Diagnosis not present

## 2024-08-23 DIAGNOSIS — D1801 Hemangioma of skin and subcutaneous tissue: Secondary | ICD-10-CM

## 2024-08-23 DIAGNOSIS — L814 Other melanin hyperpigmentation: Secondary | ICD-10-CM

## 2024-08-23 DIAGNOSIS — W908XXA Exposure to other nonionizing radiation, initial encounter: Secondary | ICD-10-CM

## 2024-08-23 DIAGNOSIS — D229 Melanocytic nevi, unspecified: Secondary | ICD-10-CM

## 2024-08-23 DIAGNOSIS — L821 Other seborrheic keratosis: Secondary | ICD-10-CM

## 2024-08-23 DIAGNOSIS — Z1283 Encounter for screening for malignant neoplasm of skin: Secondary | ICD-10-CM

## 2024-08-23 NOTE — Progress Notes (Deleted)
   Follow-Up Visit   Subjective  Danielle Yates is a 83 y.o. female who presents for the following: Skin Cancer Screening and Full Body Skin Exam, Hx of BCC, check spot R arm ~3wks, that she has picked at, pt thinks may have had a tick in it  The patient presents for Total-Body Skin Exam (TBSE) for skin cancer screening and mole check. The patient has spots, moles and lesions to be evaluated, some may be new or changing and the patient may have concern these could be cancer.    The following portions of the chart were reviewed this encounter and updated as appropriate: medications, allergies, medical history  Review of Systems:  No other skin or systemic complaints except as noted in HPI or Assessment and Plan.  Objective  Well appearing patient in no apparent distress; mood and affect are within normal limits.  A full examination was performed including scalp, head, eyes, ears, nose, lips, neck, chest, axillae, abdomen, back, buttocks, bilateral upper extremities, bilateral lower extremities, hands, feet, fingers, toes, fingernails, and toenails. All findings within normal limits unless otherwise noted below.   Relevant physical exam findings are noted in the Assessment and Plan.     Assessment & Plan   SKIN CANCER SCREENING PERFORMED TODAY.  ACTINIC DAMAGE - Chronic condition, secondary to cumulative UV/sun exposure - diffuse scaly erythematous macules with underlying dyspigmentation - Recommend daily broad spectrum sunscreen SPF 30+ to sun-exposed areas, reapply every 2 hours as needed.  - Staying in the shade or wearing long sleeves, sun glasses (UVA+UVB protection) and wide brim hats (4-inch brim around the entire circumference of the hat) are also recommended for sun protection.  - Call for new or changing lesions.  LENTIGINES, SEBORRHEIC KERATOSES, HEMANGIOMAS - Benign normal skin lesions - Benign-appearing - Call for any changes  MELANOCYTIC NEVI - Tan-brown and/or  pink-flesh-colored symmetric macules and papules - Benign appearing on exam today - Observation - Call clinic for new or changing moles - Recommend daily use of broad spectrum spf 30+ sunscreen to sun-exposed areas.   HISTORY OF BASAL CELL CARCINOMA OF THE SKIN - No evidence of recurrence today - Recommend regular full body skin exams - Recommend daily broad spectrum sunscreen SPF 30+ to sun-exposed areas, reapply every 2 hours as needed.  - Call if any new or changing lesions are noted between office visits  - Upper back R of midline      Return in about 1 year (around 08/23/2025) for TBSE, Hx of BCC.  I, Grayce Saunas, RMA, am acting as scribe for Boneta Sharps, MD .   Documentation: I have reviewed the above documentation for accuracy and completeness, and I agree with the above.  Boneta Sharps, MD

## 2024-08-23 NOTE — Progress Notes (Signed)
   Follow-Up Visit   Subjective  Danielle Yates is a 83 y.o. female who presents for the following: Skin Cancer Screening and Full Body Skin Exam, hx of BCC, check spot R wrist ~3 wks, picks at, pt thinks may have had a tick in that area  The patient presents for Total-Body Skin Exam (TBSE) for skin cancer screening and mole check. The patient has spots, moles and lesions to be evaluated, some may be new or changing and the patient may have concern these could be cancer.    The following portions of the chart were reviewed this encounter and updated as appropriate: medications, allergies, medical history  Review of Systems:  No other skin or systemic complaints except as noted in HPI or Assessment and Plan.  Objective  Well appearing patient in no apparent distress; mood and affect are within normal limits.  A full examination was performed including scalp, head, eyes, ears, nose, lips, neck, chest, axillae, abdomen, back, buttocks, bilateral upper extremities, bilateral lower extremities, hands, feet, fingers, toes, fingernails, and toenails. All findings within normal limits unless otherwise noted below.   Relevant physical exam findings are noted in the Assessment and Plan.    Assessment & Plan   SKIN CANCER SCREENING PERFORMED TODAY.  ACTINIC DAMAGE - Chronic condition, secondary to cumulative UV/sun exposure - diffuse scaly erythematous macules with underlying dyspigmentation - Recommend daily broad spectrum sunscreen SPF 30+ to sun-exposed areas, reapply every 2 hours as needed.  - Staying in the shade or wearing long sleeves, sun glasses (UVA+UVB protection) and wide brim hats (4-inch brim around the entire circumference of the hat) are also recommended for sun protection.  - Call for new or changing lesions.  LENTIGINES, SEBORRHEIC KERATOSES, HEMANGIOMAS - Benign normal skin lesions - Benign-appearing - Call for any changes  MELANOCYTIC NEVI - Tan-brown and/or  pink-flesh-colored symmetric macules and papules - Benign appearing on exam today - Observation - Call clinic for new or changing moles - Recommend daily use of broad spectrum spf 30+ sunscreen to sun-exposed areas.   HISTORY OF BASAL CELL CARCINOMA OF THE SKIN - No evidence of recurrence today - Recommend regular full body skin exams - Recommend daily broad spectrum sunscreen SPF 30+ to sun-exposed areas, reapply every 2 hours as needed.  - Call if any new or changing lesions are noted between office visits  - Upper back R of midline    MULTIPLE BENIGN NEVI   LENTIGINES   ACTINIC ELASTOSIS   SEBORRHEIC KERATOSES   CHERRY ANGIOMA   Return in about 1 year (around 08/23/2025) for TBSE, Hx of BCC.  I, Grayce Saunas, RMA, am acting as scribe for Boneta Sharps, MD .   Documentation: I have reviewed the above documentation for accuracy and completeness, and I agree with the above.  Boneta Sharps, MD

## 2024-08-23 NOTE — Patient Instructions (Signed)

## 2024-09-24 DIAGNOSIS — H353211 Exudative age-related macular degeneration, right eye, with active choroidal neovascularization: Secondary | ICD-10-CM | POA: Diagnosis not present

## 2024-09-24 DIAGNOSIS — Z961 Presence of intraocular lens: Secondary | ICD-10-CM | POA: Diagnosis not present

## 2024-09-24 DIAGNOSIS — H524 Presbyopia: Secondary | ICD-10-CM | POA: Diagnosis not present

## 2024-09-24 DIAGNOSIS — H26493 Other secondary cataract, bilateral: Secondary | ICD-10-CM | POA: Diagnosis not present

## 2024-09-27 ENCOUNTER — Telehealth: Payer: Self-pay

## 2024-09-27 NOTE — Telephone Encounter (Signed)
 Copied from CRM [id number]. Topic: Appointments - Scheduling Inquiry for Clinic >> Sep 27, 2024  8:26 AM Rea BROCKS wrote: Reason for CRM: Patient  called in to r/s Physical that was for tomorrow. New dates are showing February. Patient scheduled for a February date but she is asking if it is possible for Dr. Glendia to see her for a physical before the year is up.   [phone] (H)  I spoke with patient and scheduled an appointment for her to have her physical on 10/08/2024 with Dr. Glendia.  Patient states she would like to have her labs drawn at [facility], which is closer to where she lives.  Patient states we usually give her paperwork to take with her.  Patient states she believes we can fax it to [facility].  Patient states she would like for us  to please call her and let her know when this has been done.

## 2024-09-28 ENCOUNTER — Encounter: Admitting: Internal Medicine

## 2024-09-28 NOTE — Telephone Encounter (Signed)
 In reviewing, it appears that the labs are already in epic.

## 2024-09-28 NOTE — Telephone Encounter (Signed)
Pt informed

## 2024-10-03 DIAGNOSIS — R739 Hyperglycemia, unspecified: Secondary | ICD-10-CM | POA: Diagnosis not present

## 2024-10-03 DIAGNOSIS — I10 Essential (primary) hypertension: Secondary | ICD-10-CM | POA: Diagnosis not present

## 2024-10-03 DIAGNOSIS — E7800 Pure hypercholesterolemia, unspecified: Secondary | ICD-10-CM | POA: Diagnosis not present

## 2024-10-04 ENCOUNTER — Encounter: Payer: Self-pay | Admitting: Podiatry

## 2024-10-04 ENCOUNTER — Ambulatory Visit: Admitting: Podiatry

## 2024-10-04 DIAGNOSIS — I739 Peripheral vascular disease, unspecified: Secondary | ICD-10-CM | POA: Diagnosis not present

## 2024-10-04 DIAGNOSIS — B351 Tinea unguium: Secondary | ICD-10-CM | POA: Diagnosis not present

## 2024-10-04 DIAGNOSIS — M79676 Pain in unspecified toe(s): Secondary | ICD-10-CM | POA: Diagnosis not present

## 2024-10-04 LAB — BASIC METABOLIC PANEL WITH GFR
BUN/Creatinine Ratio: 18 (ref 12–28)
BUN: 18 mg/dL (ref 8–27)
CO2: 26 mmol/L (ref 20–29)
Calcium: 9.6 mg/dL (ref 8.7–10.3)
Chloride: 97 mmol/L (ref 96–106)
Creatinine, Ser: 1.01 mg/dL — ABNORMAL HIGH (ref 0.57–1.00)
Glucose: 94 mg/dL (ref 70–99)
Potassium: 3.7 mmol/L (ref 3.5–5.2)
Sodium: 138 mmol/L (ref 134–144)
eGFR: 55 mL/min/1.73 — ABNORMAL LOW (ref >59)

## 2024-10-04 LAB — HEPATIC FUNCTION PANEL
ALT: 7 IU/L (ref 0–32)
AST: 15 IU/L (ref 0–40)
Albumin: 4.4 g/dL (ref 3.7–4.7)
Alkaline Phosphatase: 54 IU/L (ref 48–129)
Bilirubin Total: 1.1 mg/dL (ref 0.0–1.2)
Bilirubin, Direct: 0.36 mg/dL (ref 0.00–0.40)
Total Protein: 6.8 g/dL (ref 6.0–8.5)

## 2024-10-04 LAB — LIPID PANEL
Chol/HDL Ratio: 2.6 ratio (ref 0.0–4.4)
Cholesterol, Total: 169 mg/dL (ref 100–199)
HDL: 66 mg/dL (ref >39)
LDL Chol Calc (NIH): 90 mg/dL (ref 0–99)
Triglycerides: 66 mg/dL (ref 0–149)
VLDL Cholesterol Cal: 13 mg/dL (ref 5–40)

## 2024-10-04 LAB — HEMOGLOBIN A1C
Est. average glucose Bld gHb Est-mCnc: 108 mg/dL
Hgb A1c MFr Bld: 5.4 % (ref 4.8–5.6)

## 2024-10-07 NOTE — Progress Notes (Signed)
  Subjective:  Patient ID: Danielle Yates, female    DOB: 08/24/1941,  MRN: [id number]  KARAN INCLAN presents to clinic today for at risk foot care. Patient has h/o PAD and painful mycotic toenails of both feet that are difficult to trim. Pain interferes with daily activities and wearing enclosed shoe gear comfortably. She has h/o iliac disease. She is followed by Dr. Deatrice Cage. Chief Complaint  Patient presents with   Toe Pain    Denies being diabetic. Dr. Glendia is her PCP. Last visit was in July   New problem(s): None.   PCP is Glendia Shad, MD.  Allergies  Allergen Reactions   Amlodipine      Headache/dizziness   Codeine Nausea Only   Coreg  [Carvedilol ]     Headache/dizziness   Crestor [Rosuvastatin] Other (See Comments)    PER PT MADE ME FEEL WEIRD   Metoprolol      bradycardia   Statins     Tolerates low dose pravastatin .   Nickel Rash    Review of Systems: Negative except as noted in the HPI.  Objective: No changes noted in today's physical examination. There were no vitals filed for this visit. DEMESHA Yates is a pleasant 83 y.o. female in NAD. AAO x 3.  Vascular Examination: Capillary refill time immediate b/l. Palpable pedal pulses. Pedal hair present b/l. Pedal edema absent. No pain with calf compression b/l. Skin temperature gradient WNL b/l. No cyanosis or clubbing b/l. No ischemia or gangrene noted b/l.   Neurological Examination: Sensation grossly intact b/l with 10 gram monofilament.   Dermatological Examination: Pedal skin with normal turgor, texture and tone b/l.  No open wounds. No interdigital macerations.   Toenails 1-5 b/l thick, discolored, elongated with subungual debris and pain on dorsal palpation.   No hyperkeratotic nor porokeratotic lesions.  Musculoskeletal Examination: Muscle strength 5/5 to all lower extremity muscle groups bilaterally. No pain, crepitus or joint limitation noted with ROM bilateral LE. No gross bony  deformities bilaterally.  Radiographs: None  Last A1c:      Latest Ref Rng & Units 10/03/2024    8:55 AM 06/21/2024    8:25 AM 02/10/2024    8:35 AM 10/13/2023    8:12 AM  Hemoglobin A1C  Hemoglobin-A1c 4.8 - 5.6 % 5.4  5.4  5.6  5.7    Assessment/Plan: 1. Pain due to onychomycosis of toenail   2. PAD (peripheral artery disease)   Patient was evaluated and treated. All patient's and/or POA's questions/concerns addressed on today's visit. Toenails 1-5 b/l debrided in length and girth without incident. Continue soft, supportive shoe gear daily. Report any pedal injuries to medical professional. Call office if there are any questions/concerns. -Patient/POA to call should there be question/concern in the interim.   Return in about 3 months (around 01/04/2025).  Delon LITTIE Merlin, DPM      [facility] LOCATION: [address] [place] [address]                   Office [phone]   [facility] LOCATION: [address] [place], [place] [address] Office [phone]

## 2024-10-08 ENCOUNTER — Telehealth: Payer: Self-pay

## 2024-10-08 ENCOUNTER — Ambulatory Visit: Admitting: Internal Medicine

## 2024-10-08 VITALS — BP 110/70 | HR 74 | Temp 98.3°F | Ht 61.25 in | Wt 182.0 lb

## 2024-10-08 DIAGNOSIS — I739 Peripheral vascular disease, unspecified: Secondary | ICD-10-CM

## 2024-10-08 DIAGNOSIS — H353210 Exudative age-related macular degeneration, right eye, stage unspecified: Secondary | ICD-10-CM

## 2024-10-08 DIAGNOSIS — Z0000 Encounter for general adult medical examination without abnormal findings: Secondary | ICD-10-CM

## 2024-10-08 DIAGNOSIS — Z720 Tobacco use: Secondary | ICD-10-CM

## 2024-10-08 DIAGNOSIS — I4821 Permanent atrial fibrillation: Secondary | ICD-10-CM | POA: Diagnosis not present

## 2024-10-08 DIAGNOSIS — I10 Essential (primary) hypertension: Secondary | ICD-10-CM | POA: Diagnosis not present

## 2024-10-08 DIAGNOSIS — F439 Reaction to severe stress, unspecified: Secondary | ICD-10-CM | POA: Diagnosis not present

## 2024-10-08 DIAGNOSIS — E7800 Pure hypercholesterolemia, unspecified: Secondary | ICD-10-CM | POA: Diagnosis not present

## 2024-10-08 DIAGNOSIS — Z23 Encounter for immunization: Secondary | ICD-10-CM

## 2024-10-08 DIAGNOSIS — E538 Deficiency of other specified B group vitamins: Secondary | ICD-10-CM

## 2024-10-08 DIAGNOSIS — D6869 Other thrombophilia: Secondary | ICD-10-CM

## 2024-10-08 DIAGNOSIS — J449 Chronic obstructive pulmonary disease, unspecified: Secondary | ICD-10-CM

## 2024-10-08 DIAGNOSIS — R739 Hyperglycemia, unspecified: Secondary | ICD-10-CM

## 2024-10-08 DIAGNOSIS — M954 Acquired deformity of chest and rib: Secondary | ICD-10-CM | POA: Diagnosis not present

## 2024-10-08 NOTE — Telephone Encounter (Signed)
 Patient saw Dr. Allena Hamilton today.  Dr. Hamilton would like to see patient again in three months with fasting labs a couple of days before her visit.  Patient states she will be having her labs drawn at [facility].  In the system, we already have her lab order listed as [facility].  I did not schedule a lab visit for patient here in our clinic.

## 2024-10-08 NOTE — Assessment & Plan Note (Addendum)
Breathing stable.  Have discussed the need to quit smoking.

## 2024-10-08 NOTE — Progress Notes (Addendum)
 Subjective:    Patient ID: Danielle Yates, female    DOB: 03/15/1941, 83 y.o.   MRN: [id number]  Patient here for  Chief Complaint  Patient presents with   Annual Exam    CPE    HPI Here for a physical exam. Continues on eliquis  and diltiazem  for afib. Breathing stable. Continues on pravastatin . Seeing ophthalmology - macular degeneration - affecting vision. Not driving now.  Sold her car. Discussed transportation concerns. Breathing stable. No chest pain reported. No abdominal pain or bowel change reported.   Overdue colonoscopy.   Past Medical History:  Diagnosis Date   Allergy    Aortic valve sclerosis    a. TTE 2013: EF 60-65%, no RWMA, Gr1DD, trivial MR, mildly dilated LA 43 mm; b. TTE 08/2017: EF 65-70%, mild LVH, aortic valve sclerosis without stenosis, mildly dilated LA 36 mm, RV size and sys fxn nl   Arthropathy, unspecified, site unspecified    Basal cell carcinoma 06/15/2018   upper back right of midline/superficial   Cataract    Chronic rhinitis    Disorder of bone and cartilage, unspecified    Diverticulosis of colon (without mention of hemorrhage)    Essential hypertension    a. refractory HTN; b. renal US  2013 negative for RAS   External hemorrhoids without mention of complication    Medication intolerance    Obesity, unspecified    Other abnormal glucose    Panic disorder without agoraphobia    Peripheral arterial disease    a.  Known bilateral common iliac disease-> medical therapy-asymptomatic.   Persistent atrial fibrillation (HCC)    a. diagnosed 10/19/17; b. CHADS2VASc 5 (HTN, age x 2, vascular disease, female)-->Eliquis    Pure hypercholesterolemia    Tobacco use disorder    Unspecified vitamin D  deficiency    Past Surgical History:  Procedure Laterality Date   APPENDECTOMY  1985   CHOLECYSTECTOMY  1985   with open wound   EYE SURGERY  2011   CATARACTS -  BOTH   Removed spot on face     [name]   TONSILLECTOMY AND ADENOIDECTOMY  1946    VAGINAL HYSTERECTOMY  1982   fibroid and endometriosis; one ovary remaining   Family History  Problem Relation Age of Onset   Hypertension Sister    Heart disease Brother    Hypertension Brother    Hyperlipidemia Brother    Cancer Brother    Aneurysm Mother    Rheumatic fever Mother        as an adult   Heart disease Father        MI x 2   Social History   Socioeconomic History   Marital status: Widowed    Spouse name: Not on file   Number of children: 0   Years of education: college   Highest education level: Bachelor's degree (e.g., BA, AB, BS)  Occupational History   Occupation: academic librarian    Comment: [employer] x 20 years  Tobacco Use   Smoking status: Every Day    Current packs/day: 0.50    Average packs/day: 0.5 packs/day for 50.0 years (25.0 ttl pk-yrs)    Types: Cigarettes   Smokeless tobacco: Never   Tobacco comments:    10 a day  Vaping Use   Vaping status: Never Used  Substance and Sexual Activity   Alcohol use: No    Alcohol/week: 0.0 standard drinks of alcohol   Drug use: No   Sexual activity: Never  Other Topics Concern  Not on file  Social History Narrative   Lives alone. Widowed after 23 years. Not dating. Has dog. Exercise: Walking; light has not been walking as she used to before her 2 dogs passed away. Tries to walk some. Caffeine: Carbonated beverages, 3 servings/day;Diet coke. 3 a day.      10/02/12  PER PATIENT'S LAVENDER FORM: WALK 5 DAYS/WEEK FOR 30 MINUTES   Social Drivers of Corporate Investment Banker Strain: Low Risk  (10/17/2023)   Overall Financial Resource Strain (CARDIA)    Difficulty of Paying Living Expenses: Not hard at all  Food Insecurity: Not on file  Transportation Needs: No Transportation Needs (10/17/2023)   PRAPARE - Administrator, Civil Service (Medical): No    Lack of Transportation (Non-Medical): No  Physical Activity: Unknown (10/17/2023)   Exercise Vital Sign    Days of Exercise per Week: 0  days    Minutes of Exercise per Session: Not on file  Stress: No Stress Concern Present (10/17/2023)   [employer] of Occupational Health - Occupational Stress Questionnaire    Feeling of Stress : Only a little  Social Connections: Moderately Integrated (10/17/2023)   Social Connection and Isolation Panel    Frequency of Communication with Friends and Family: More than three times a week    Frequency of Social Gatherings with Friends and Family: Twice a week    Attends Religious Services: More than 4 times per year    Active Member of [employer] or Organizations: Yes    Attends Banker Meetings: 1 to 4 times per year    Marital Status: Widowed     Review of Systems  Constitutional:  Negative for appetite change and unexpected weight change.  HENT:  Negative for congestion, sinus pressure and sore throat.   Eyes:  Negative for pain and visual disturbance.  Respiratory:  Negative for cough and chest tightness.        Breathing stable.   Cardiovascular:  Negative for chest pain and palpitations.       No increased swelling.   Gastrointestinal:  Negative for abdominal pain, diarrhea, nausea and vomiting.  Genitourinary:  Negative for difficulty urinating and dysuria.  Musculoskeletal:  Negative for joint swelling and myalgias.  Skin:  Negative for color change and rash.  Neurological:  Negative for dizziness and headaches.  Hematological:  Negative for adenopathy. Does not bruise/bleed easily.  Psychiatric/Behavioral:  Negative for agitation and dysphoric mood.        Objective:     BP 110/70   Pulse 74   Temp 98.3 F (36.8 C) (Oral)   Ht 5' 1.25 (1.556 m)   Wt 182 lb (82.6 kg)   SpO2 95%   BMI 34.11 kg/m  Wt Readings from Last 3 Encounters:  10/08/24 182 lb (82.6 kg)  06/27/24 193 lb 3.2 oz (87.6 kg)  04/18/24 192 lb 9.6 oz (87.4 kg)    Physical Exam Vitals reviewed.  Constitutional:      General: She is not in acute distress.    Appearance: Normal  appearance. She is well-developed.  HENT:     Head: Normocephalic and atraumatic.     Right Ear: External ear normal.     Left Ear: External ear normal.     Mouth/Throat:     Pharynx: No oropharyngeal exudate or posterior oropharyngeal erythema.  Eyes:     General: No scleral icterus.       Right eye: No discharge.  Left eye: No discharge.     Conjunctiva/sclera: Conjunctivae normal.  Neck:     Thyroid : No thyromegaly.  Cardiovascular:     Rate and Rhythm: Normal rate and regular rhythm.  Pulmonary:     Effort: No tachypnea, accessory muscle usage or respiratory distress.     Breath sounds: No decreased breath sounds.     Comments: Some scattered wheezing. Increased air movement.  Chest:  Breasts:    Right: No inverted nipple, mass, nipple discharge or tenderness (no axillary adenopathy).     Left: No inverted nipple, mass, nipple discharge or tenderness (no axilarry adenopathy).  Abdominal:     General: Bowel sounds are normal.     Palpations: Abdomen is soft.     Tenderness: There is no abdominal tenderness.  Musculoskeletal:        General: No swelling or tenderness.     Cervical back: Neck supple.  Lymphadenopathy:     Cervical: No cervical adenopathy.  Skin:    Findings: No erythema or rash.  Neurological:     Mental Status: She is alert and oriented to person, place, and time.  Psychiatric:        Mood and Affect: Mood normal.        Behavior: Behavior normal.         Outpatient Encounter Medications as of 10/08/2024  Medication Sig   albuterol  (VENTOLIN  HFA) 108 (90 Base) MCG/ACT inhaler INHALE 2 PUFFS INTO THE LUNGS EVERY 6 HOURS AS NEEDED FOR WHEEZING OR SHORTNESS OF BREATH   Cholecalciferol 125 MCG (5000 UT) capsule Take 5,000 Units by mouth daily.   Cyanocobalamin  1000 MCG/15ML LIQD Take by mouth.   diltiazem  (TIADYLT  ER) 360 MG 24 hr capsule TAKE 1 CAPSULE BY MOUTH EVERY DAY   ELIQUIS  5 MG TABS tablet TAKE 1 TABLET BY MOUTH TWICE A DAY   enalapril   (VASOTEC ) 20 MG tablet TAKE 1 TABLET BY MOUTH IN THE MORNING AND 2 TABLETS IN THE AFTERNOON.   FLUoxetine  (PROZAC ) 20 MG capsule Take 1 capsule (20 mg total) by mouth daily.   fluticasone  (FLONASE ) 50 MCG/ACT nasal spray SHAKE LIQUID AND USE 2 SPRAYS IN EACH NOSTRIL DAILY   Multiple Vitamins-Minerals (PRESERVISION AREDS) CAPS Take 1 capsule by mouth daily.   Probiotic Product (PROBIOTIC DAILY PO) Take 1 tablet by mouth.   triamterene -hydrochlorothiazide (MAXZIDE-25) 37.5-25 MG tablet TAKE 1 TABLET BY MOUTH EVERY DAY   VITAMIN D , CHOLECALCIFEROL, PO Take by mouth daily.   ipratropium (ATROVENT  HFA) 17 MCG/ACT inhaler Inhale 2 puffs into the lungs every 6 (six) hours as needed for wheezing.   pravastatin  (PRAVACHOL ) 10 MG tablet Take 1 tablet (10 mg total) by mouth daily.   [DISCONTINUED] ipratropium (ATROVENT  HFA) 17 MCG/ACT inhaler Inhale 2 puffs into the lungs every 6 (six) hours as needed for wheezing.   [DISCONTINUED] pravastatin  (PRAVACHOL ) 10 MG tablet TAKE 1 TABLET BY MOUTH EVERY DAY   No facility-administered encounter medications on file as of 10/08/2024.     Lab Results  Component Value Date   WBC 5.9 02/10/2024   HGB 15.5 02/10/2024   HCT 47.0 (H) 02/10/2024   PLT 259 02/10/2024   GLUCOSE 94 10/03/2024   CHOL 169 10/03/2024   TRIG 66 10/03/2024   HDL 66 10/03/2024   LDLCALC 90 10/03/2024   ALT 7 10/03/2024   AST 15 10/03/2024   NA 138 10/03/2024   K 3.7 10/03/2024   CL 97 10/03/2024   CREATININE 1.01 (H) 10/03/2024   BUN 18  10/03/2024   CO2 26 10/03/2024   TSH 2.690 02/10/2024   HGBA1C 5.4 10/03/2024       Assessment & Plan:  Routine general medical examination at a health care facility  Permanent atrial fibrillation [facility]) Assessment & Plan: Continue eliquis . Continue diltiazem . Stable. No increased heart rate or palpitations. Follow. No change in medication.    Chronic obstructive pulmonary disease, unspecified COPD type (HCC) Assessment & Plan: Breathing  stable. Have discussed the need to quit smoking.    Essential hypertension, benign Assessment & Plan: Continue diltiazem  and enalapril . Follow pressures. No changes in medication. Follow metabolic panel.   Orders: -     Basic metabolic panel with GFR; Future  Health care maintenance Assessment & Plan: Physical today 10/08/24.  Mammogram 07/04/24 - Birads I.  Colonoscopy 2012.  Overdue.      Pure hypercholesterolemia Assessment & Plan: Continues on pravastatin . Low cholesterol diet and exercise. Had intolerance to other statin medications and zetia .  Follow lipid panel.  Lab Results  Component Value Date   CHOL 169 10/03/2024   HDL 66 10/03/2024   LDLCALC 90 10/03/2024   TRIG 66 10/03/2024   CHOLHDL 2.6 10/03/2024     Orders: -     Hepatic function panel; Future -     Lipid panel; Future -     CBC with Differential/Platelet; Future -     TSH; Future  Hyperglycemia Assessment & Plan: Low carb diet and exercise. Follow met b and A1c.   Orders: -     Hemoglobin A1c; Future  Flu vaccine need -     Flu vaccine HIGH DOSE PF(Fluzone Trivalent)  Exudative age-related macular degeneration of right eye, unspecified stage [facility]) Assessment & Plan: Followed by ophthalmology. Not able to drive. Sold her car. Transportation is an issue. See if we can get help with transportation to appts, etc.   Orders: -     AMB Referral VBCI Care Management  Tobacco abuse Assessment & Plan: Have discussed the need to quit smoking. Continues to smoke.    Stress Assessment & Plan: Decreased eyesight. Overall appears to be handling things relatively well. Follow.    PAD (peripheral artery disease) Assessment & Plan: Affecting common iliac arteries mainly.  Continue risk factor modification.  Continue pravastatin .    Deformity of sternum Assessment & Plan: Xray revealed no acute abnormality. Elects to follow.    B12 deficiency Assessment & Plan: Continue B12 supplements.  Follow  b12 level.    Acquired thrombophilia Assessment & Plan: Afib. Continue eliquis .    Other orders -     Pravastatin  Sodium; Take 1 tablet (10 mg total) by mouth daily.  Dispense: 90 tablet; Refill: 3 -     Ipratropium Bromide  HFA; Inhale 2 puffs into the lungs every 6 (six) hours as needed for wheezing.  Dispense: 1 each; Refill: 12     Allena Hamilton, MD

## 2024-10-08 NOTE — Telephone Encounter (Signed)
 Noted, thanks!

## 2024-10-08 NOTE — Assessment & Plan Note (Addendum)
 Continue eliquis . Continue diltiazem . Stable. No increased heart rate or palpitations. Follow. No change in medication.

## 2024-10-08 NOTE — Assessment & Plan Note (Addendum)
 Physical today 10/08/24.  Mammogram 07/04/24 - Birads I.  Colonoscopy 2012.  Overdue.

## 2024-10-14 ENCOUNTER — Encounter: Payer: Self-pay | Admitting: Internal Medicine

## 2024-10-14 MED ORDER — PRAVASTATIN SODIUM 10 MG PO TABS: 10.0000 mg | ORAL_TABLET | Freq: Every day | ORAL | 3 refills | Status: AC

## 2024-10-14 MED ORDER — IPRATROPIUM BROMIDE HFA 17 MCG/ACT IN AERS: 2.0000 | INHALATION_SPRAY | Freq: Four times a day (QID) | RESPIRATORY_TRACT | 12 refills | Status: AC | PRN

## 2024-10-14 NOTE — Assessment & Plan Note (Signed)
 Decreased eyesight. Overall appears to be handling things relatively well. Follow.

## 2024-10-14 NOTE — Assessment & Plan Note (Signed)
 Followed by ophthalmology. Not able to drive. Sold her car. Transportation is an issue. See if we can get help with transportation to appts, etc.

## 2024-10-14 NOTE — Assessment & Plan Note (Signed)
 Have discussed the need to quit smoking. Continues to smoke.

## 2024-10-14 NOTE — Assessment & Plan Note (Signed)
 Continue diltiazem  and enalapril . Follow pressures. No changes in medication. Follow metabolic panel.

## 2024-10-14 NOTE — Assessment & Plan Note (Signed)
 Low-carb diet and exercise.  Follow met b and A1c.

## 2024-10-14 NOTE — Assessment & Plan Note (Signed)
 Continues on pravastatin . Low cholesterol diet and exercise. Had intolerance to other statin medications and zetia .  Follow lipid panel.  Lab Results  Component Value Date   CHOL 169 10/03/2024   HDL 66 10/03/2024   LDLCALC 90 10/03/2024   TRIG 66 10/03/2024   CHOLHDL 2.6 10/03/2024

## 2024-10-14 NOTE — Assessment & Plan Note (Signed)
Affecting common iliac arteries mainly.  Continue risk factor modification.  Continue pravastatin.

## 2024-10-14 NOTE — Assessment & Plan Note (Signed)
 Continue B12 supplements.  Follow b12 level.

## 2024-10-14 NOTE — Addendum Note (Signed)
 Addended by: GLENDIA ALLENA RAMAN on: 10/14/2024 10:02 AM   Modules accepted: Orders

## 2024-10-14 NOTE — Assessment & Plan Note (Signed)
 Xray revealed no acute abnormality. Elects to follow.

## 2024-10-14 NOTE — Assessment & Plan Note (Signed)
 Afib. Continue eliquis.

## 2024-10-17 ENCOUNTER — Ambulatory Visit: Payer: Self-pay

## 2024-10-17 NOTE — Telephone Encounter (Signed)
 FYI Only or Action Required?: Action required by provider: clinical question for provider. Pt need to be seen in 24 hrs for UTI like symptoms but does not have transportation. Unable to do virtual visit. Please advise.   Patient was last seen in primary care on 10/08/2024 by Glendia Shad, MD.  Called Nurse Triage reporting Dysuria.  Symptoms began yesterday.  Interventions attempted: OTC medications: Tylenol.  Symptoms are: gradually worsening.  Triage Disposition: See Physician Within 24 Hours  Patient/caregiver understands and will follow disposition?: Yes  Copied from CRM #[id number]. Topic: Clinical - Red Word Triage >> Oct 17, 2024 11:55 AM Rosina BIRCH wrote: Red Word that prompted transfer to Nurse Triage: possible UTI with a little pain and frequency. It started last night Reason for Disposition  Age > 50 years  Answer Assessment - Initial Assessment Questions Pt had BM accident on Sunday, pt think some stool may have gotten into her urethra. Reports painful frequent urination starting last night. No fever or blood in urine. Urine not cloudy or foul smelling. Advised appt in 24 hrs, unable to schedule as pt does not drive d/t macular degeneration and normally gets a ride from her neighbors who are out of town. Pt unsure when they will be back and doesn't think she can get a ride from anyone else. Unable to do virtual visit d/t poor eye sight. Pt noted to have been recently seen in [place] for physical with PCP on 11/3. Sending message to clinic for recommendations. Confirmed pts pharmacy on file is correct if medications are prescribed. Advised UC, ED or 911 for worsening symptoms.   1. SEVERITY: How bad is the pain?  (e.g., Scale 1-10; mild, moderate, or severe)     2/10 pain with urination  2. FREQUENCY: How many times have you had painful urination today?      Most of the painful urination was last night, about 3-4 times.  3. PATTERN: Is pain present every time you urinate or  just sometimes?      Sometimes  4. ONSET: When did the painful urination start?      Last night  5. FEVER: Do you have a fever? If Yes, ask: What is your temperature, how was it measured, and when did it start?     Denies  6. PAST UTI: Have you had a urine infection before? If Yes, ask: When was the last time? and What happened that time?      Yes, pat UTI's. Current symptoms feel similar.  7. CAUSE: What do you think is causing the painful urination?  (e.g., UTI, scratch, Herpes sore)     UTI  8. OTHER SYMPTOMS: Do you have any other symptoms? (e.g., blood in urine, flank pain, genital sores, urgency, vaginal discharge)     Denies above.  Protocols used: Urination Pain - Female-A-AH

## 2024-10-17 NOTE — Telephone Encounter (Signed)
 Please call and f/u and confirm how she is doing.  Sympotms? Need urine - to check to confirm if actual infection present.

## 2024-10-17 NOTE — Telephone Encounter (Signed)
 NOTED

## 2024-10-17 NOTE — Telephone Encounter (Signed)
 Spoke with pt. Pt is going to try to do a mychart visit. Pt stated she would call back if that is unsuccessful

## 2024-10-18 ENCOUNTER — Ambulatory Visit (INDEPENDENT_AMBULATORY_CARE_PROVIDER_SITE_OTHER)

## 2024-10-18 VITALS — BP 106/56 | HR 85 | Temp 97.7°F | Ht 61.25 in | Wt 182.1 lb

## 2024-10-18 DIAGNOSIS — R300 Dysuria: Secondary | ICD-10-CM

## 2024-10-18 LAB — POCT URINALYSIS DIPSTICK
Bilirubin, UA: NEGATIVE
Glucose, UA: NEGATIVE
Ketones, UA: NEGATIVE
Protein, UA: POSITIVE — AB
Spec Grav, UA: >=1.03 — AB (ref 1.010–1.025)
Urobilinogen, UA: 0.2 U/dL
pH, UA: 6 (ref 5.0–8.0)

## 2024-10-18 MED ORDER — CEPHALEXIN 500 MG PO CAPS: 500.0000 mg | ORAL_CAPSULE | Freq: Two times a day (BID) | ORAL | 0 refills | Status: AC

## 2024-10-18 NOTE — Telephone Encounter (Signed)
 Please confirm was seen and let me know if any problems.

## 2024-10-18 NOTE — Progress Notes (Signed)
 Acute visit   Patient: Danielle Yates   DOB: [birth year]   83 y.o. Female  MRN: [id number] PCP: Glendia Shad, MD   Chief Complaint  Patient presents with   Acute Visit    Patient is here today because she thinks she has a UTI, night before last she got up and lots of urine came out.  States that pain sometimes with urination.   Subjective    Discussed the use of AI scribe software for clinical note transcription with the patient, who gave verbal consent to proceed.  History of Present Illness Danielle Yates Danielle Yates is an 83 year old female who presents with a burning sensation during urination.  She has been experiencing a burning sensation during urination that began a few days ago. Initially, she had an episode of urinary incontinence where she 'couldn't hold it' and urinated on the floor. The symptoms seemed to ease up yesterday but returned today with burning during urination. She has not noticed any visible blood in her urine.  She has a history of urinary tract infections, with the last occurrence being several years ago. She recalls taking an antibiotic, possibly Keflex , in July for a similar issue.  No abdominal or back pain, fevers, or chills. She drinks mostly water but has consumed cranberry juice and diet coke recently.  Review of systems as noted in HPI.   Objective    BP (!) 106/56 (BP Location: Left Arm, Patient Position: Sitting, Cuff Size: Large)   Pulse 85   Temp 97.7 F (36.5 C) (Oral)   Ht 5' 1.25 (1.556 m)   Wt 182 lb 1.6 oz (82.6 kg)   SpO2 97%   BMI 34.13 kg/m  Physical Exam Constitutional:      Appearance: Normal appearance.  HENT:     Head: Normocephalic and atraumatic.     Mouth/Throat:     Mouth: Mucous membranes are moist.  Eyes:     Pupils: Pupils are equal, round, and reactive to light.  Pulmonary:     Effort: Pulmonary effort is normal.  Skin:    General: Skin is warm.  Neurological:     General: No focal deficit present.      Mental Status: She is alert.      Results for orders placed or performed in visit on 10/18/24  POCT Urinalysis Dipstick  Result Value Ref Range   Color, UA Yellow    Clarity, UA Clear    Glucose, UA Negative Negative   Bilirubin, UA Negative    Ketones, UA Negative    Spec Grav, UA >=1.030 (A) 1.010 - 1.025   Blood, UA Large    pH, UA 6.0 5.0 - 8.0   Protein, UA Positive (A) Negative   Urobilinogen, UA 0.2 0.2 or 1.0 E.U./dL   Nitrite, UA Postive    Leukocytes, UA Trace (A) Negative   Appearance     Odor      Assessment & Plan     Problem List Items Addressed This Visit   None Visit Diagnoses       Dysuria    -  Primary   Relevant Medications   cephALEXin  (KEFLEX ) 500 MG capsule   Other Relevant Orders   POCT Urinalysis Dipstick (Completed)   Urine Culture      Assessment & Plan Dysuria Patient with 2 days of dysuria and urinary frequency concerning for acute cystitis. POC UA with trace LE, + nitrites, and large blood. Denies systemic symptoms. - Prescribed  cephalexin  BID for 5 days. - Advised increased fluid intake. - Sent urine for culture to confirm bacterial sensitivity. - Instructed to report worsening symptoms or new symptoms like fever, chills, or dizziness.   Meds ordered this encounter  Medications   cephALEXin  (KEFLEX ) 500 MG capsule    Sig: Take 1 capsule (500 mg total) by mouth 2 (two) times daily for 5 days.    Dispense:  10 capsule    Refill:  0     No follow-ups on file.      Isaiah DELENA Pepper, MD  [facility] [phone] (phone) [phone] (fax)

## 2024-10-19 ENCOUNTER — Telehealth: Payer: Self-pay

## 2024-10-19 NOTE — Progress Notes (Signed)
 Complex Care Management Note  Care Guide Note 10/19/2024 Name: YANILEN ADAMIK MRN: [id number] DOB: 12-Sep-1941  LURETTA EVERLY is a 83 y.o. year old female who sees Glendia Shad, MD for primary care. I reached out to Lucilla VEAR Berg by phone today to offer complex care management services.  Ms. Adduci was given information about Complex Care Management services today including:   The Complex Care Management services include support from the care team which includes your Nurse Care Manager, Clinical Social Worker, or Pharmacist.  The Complex Care Management team is here to help remove barriers to the health concerns and goals most important to you. Complex Care Management services are voluntary, and the patient may decline or stop services at any time by request to their care team member.   Complex Care Management Consent Status: Patient agreed to services and verbal consent obtained.   Follow up plan:  Telephone appointment with complex care management team member scheduled for:  10/26/24 & 11/08/24.  Encounter Outcome:  Patient Scheduled  Dreama [facility]  [facility], [facility] VBCI Assistant Direct Dial: [phone]  Fax: [phone]

## 2024-10-20 LAB — URINE CULTURE

## 2024-10-20 LAB — SPECIMEN STATUS REPORT

## 2024-10-22 ENCOUNTER — Ambulatory Visit: Payer: Self-pay

## 2024-10-26 ENCOUNTER — Other Ambulatory Visit: Payer: Self-pay

## 2024-10-26 NOTE — Patient Outreach (Addendum)
 Social Drivers of Health  Community Resource and Care Coordination Visit Note   10/26/2024  Name: Danielle Yates MRN: [id number] DOB:04-11-1941  Situation: Referral received for [facility] needs assessment and assistance related to Transportation. I obtained verbal consent from Patient.  Visit completed with Patient on the phone.   Background:      Assessment:   Goals Addressed   None     Recommendation:   Patient will use transportation resources she already has including family and friends, and will contact SW with any future needs.  Follow Up Plan:   No resources were provided due to patient already having them. Patient will reach out to SW if any needs should arise.  Thersia Hoar, HEDWIG, MHA [place]  [facility] Social Worker, Population Health [phone]

## 2024-10-26 NOTE — Patient Instructions (Signed)
 Visit Information  Thank you for taking time to visit with me today. Please don't hesitate to contact me if I can be of assistance to you before our next scheduled appointment.  Our next appointment is no further scheduled appointments.   Please call the care guide team at [phone] if you need to cancel or reschedule your appointment.   Following is a copy of your care plan:   Goals Addressed   None     Please call the Suicide and Crisis Lifeline: 988 call the [place]  National Suicide Prevention Lifeline: 603-318-5568 or TTY: 5677407577 TTY 918-328-7601) to talk to a trained counselor call 1-800-273-TALK (toll free, 24 hour hotline) call 911 if you are experiencing a Mental Health or Behavioral Health Crisis or need someone to talk to.  Patient verbalized understanding of Care plan and visit instructions communicated this visit  Thersia Hoar, BSW, [facility] [place]  [facility] Social Worker, Population Health [phone]

## 2024-11-08 ENCOUNTER — Other Ambulatory Visit: Payer: Self-pay
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
## 2024-11-08 NOTE — Patient Instructions (Signed)
 Visit Information  Thank you for taking time to visit with me today. Please don't hesitate to contact me if I can be of assistance to you before our next scheduled appointment.  Our next appointment is by telephone on 11/21/2024 at 10:30 am Please call the care guide team at [phone] if you need to cancel or reschedule your appointment.   Following is a copy of your care plan:   Goals Addressed             This Visit's Progress    [facility] RN Care Plan - COPD, Fall Risk r/t Vision Impairment       Problems:  Chronic Disease Management support and education needs related to COPD and Fall Risk (Vision Impairment)  Goal: Over the next 90  days the Patient will attend all scheduled medical appointments: Patient will attend all appointments as evidenced by chart review        continue to work with RN Care Manager and/or Social Worker to address care management and care coordination needs related to COPD and Fall Risk as evidenced by adherence to care management team scheduled appointments     take all medications exactly as prescribed and will call provider for medication related questions as evidenced by patient report    verbalize basic understanding of COPD and Fall Risk  disease process and self health management plan as evidenced by taking medications as prescribed, following COPD Action plan and notifying provider of symptoms promptly, practicing energy conservation and fall precautions, notifying provider of falls or changes in vision,   Interventions:   COPD Interventions: Advised patient to engage in light exercise as tolerated 3-5 days a week to aid in the the management of COPD Advised patient to track and manage COPD triggers Advised patient to self assesses COPD action plan zone and make appointment with provider if in the yellow zone for 48 hours without improvement Assessed social determinant of health barriers Discussed the importance of adequate rest and management of  fatigue with COPD Provided education about and advised patient to utilize infection prevention strategies to reduce risk of respiratory infection Provided instruction about proper use of medications used for management of COPD including inhalers Provided patient with basic written and verbal COPD education on self care/management/and exacerbation prevention Provided written and verbal instructions on pursed lip breathing and utilized returned demonstration as teach back Screening for signs and symptoms of depression related to chronic disease state    Falls Interventions: Provided written and verbal education re: potential causes of falls and Fall prevention strategies Reviewed medications and discussed potential side effects of medications such as dizziness and frequent urination Advised patient of importance of notifying provider of falls Assessed for signs and symptoms of orthostatic hypotension Assessed for falls since last encounter Assessed patients knowledge of fall risk prevention secondary to previously provided education Provided patient information for fall alert systems Screening for signs and symptoms of depression related to chronic disease state  Assessed social determinant of health barriers Encouraged to learn more about [facility] for Vision - Anticipatory Guidance/identifying resources before crisis discussed.  Advised to contact [employer] re possible Life Alert benefit   Patient Self-Care Activities:  Attend all scheduled provider appointments Call pharmacy for medication refills 3-7 days in advance of running out of medications Call provider office for new concerns or questions  Take medications as prescribed   Work with the social worker to address care coordination needs and will continue to work with the clinical  team to address health care and disease management related needs limit outdoor activity during cold weather listen for public air quality  announcements every day do breathing exercises every day eliminate symptom triggers at home follow rescue plan if symptoms flare-up keep follow-up appointments: as discussed don't eat or exercise right before bedtime eat healthy/prescribed diet: Hearty healthy, low salt, limited sweets get at least 7 to 8 hours of sleep at night do breathing exercises every day Call [employer] regarding Life Alert   Plan:  Telephone follow up appointment with care management team member scheduled for:  11/21/2024 at 10:30 am             Please call the Suicide and Crisis Lifeline: 988 call the [place]  National Suicide Prevention Lifeline: 575-402-6723 or TTY: 519-623-6801 TTY 201-804-4352) to talk to a trained counselor call 1-800-273-TALK (toll free, 24 hour hotline) go to [facility] [address], [place] [phone]) call 911 if you are experiencing a Mental Health or Behavioral Health Crisis or need someone to talk to.  Patient verbalized understanding of Care plan and visit instructions communicated this visit  Nestora Duos, MSN, RN [facility]  [facility], [facility] RN Care Manager Direct Dial: [phone] Fax: [phone]  COPD Action Plan A COPD action plan is a description of what to do when you have a flare (exacerbation) of chronic obstructive pulmonary disease (COPD). Your action plan is a color-coded plan that lists the symptoms that indicate whether your condition is under control and what actions to take. If you have symptoms in the green zone, it means you are doing well that day. If you have symptoms in the yellow zone, it means you are having a bad day or an exacerbation. If you have symptoms in the red zone, you need urgent medical care. Follow the plan that you and your health care provider developed. Review your plan with your health care provider at each visit. Red zone Symptoms in  this zone mean that you should get medical help right away. They include: Feeling very short of breath, even when you are resting. Not being able to do any activities because of poor breathing. Not being able to sleep because of poor breathing. Fever or shaking chills. Feeling confused or very sleepy. Chest pain. Coughing up blood. If you have any of these symptoms, call emergency services (911 in the [place] or go to the nearest emergency room. Yellow zone Symptoms in this zone mean that your condition may be getting worse. They include: Feeling more short of breath than usual. Having less energy for daily activities than usual. Phlegm or mucus that is thicker than usual. Needing to use your rescue inhaler or nebulizer more often than usual. More ankle swelling than usual. Coughing more than usual. Feeling like you have a chest cold. Trouble sleeping due to COPD symptoms. Decreased appetite. COPD medicines not helping as much as usual. If you experience any yellow symptoms: Keep taking your daily medicines as directed. Use your quick-relief inhaler as told by your health care provider. If you were prescribed steroid medicine to take by mouth (oral medicine), start taking it as told by your health care provider. If you were prescribed an antibiotic medicine, start taking it as told by your health care provider. Do not stop taking the antibiotic even if you start to feel better. Use oxygen  as told by your health care provider. Get more rest. Do your pursed-lip breathing exercises. Do not smoke.  Avoid any irritants in the air. If your signs and symptoms do not improve after taking these steps, call your health care provider right away. Green zone Symptoms in this zone mean that you are doing well. They include: Being able to do your usual activities and exercise. Having the usual amount of coughing, including the same amount of phlegm or mucus. Being able to sleep well. Having a  good appetite. Where to find more information: You can find more information about COPD from: American Lung Association, My COPD Action Plan: www.lung.org COPD Foundation: www.copdfoundation.org National Heart, Lung, & Blood Institute: popsteam.is Follow these instructions at home: Continue taking your daily medicines as told by your health care provider. Make sure you receive all the immunizations that your health care provider recommends, especially the pneumococcal and influenza vaccines. Wash your hands often with soap and water. Have family members wash their hands too. Regular hand washing can help prevent infections. Follow your usual exercise and diet plan. Avoid irritants in the air, such as smoke. Do not use any products that contain nicotine  or tobacco. These products include cigarettes, chewing tobacco, and vaping devices, such as e-cigarettes. If you need help quitting, ask your health care provider. Summary A COPD action plan tells you what to do when you have a flare (exacerbation) of chronic obstructive pulmonary disease (COPD). Follow each action plan for your symptoms. If you have any symptoms in the red zone, call emergency services (911 in the [place] or go to the nearest emergency room. This information is not intended to replace advice given to you by your health care provider. Make sure you discuss any questions you have with your health care provider. Document Revised: 10/06/2023 Document Reviewed: 10/06/2023 Elsevier Patient Education  2024 Elsevier Inc.   How to Do Breathing Exercises (Pursed Lip Breathing) Pursed lip breathing is a breathing exercise to help with the feeling of being short of breath. Pursed lip breathing keeps your airways open longer when you breathe out, or exhale, and it allows more air to leave your lungs. Pursed lip breathing may be helpful for long-term breathing problems such as chronic obstructive pulmonary disease (COPD) or asthma. It  may also be helpful for: Pain, stress, or anxiety. Problems with swallowing or the vocal cords. Trouble breathing due to health problems like cancer. Pursed lip breathing helps to slow down your breathing and may help you be able to do more activities. Use the pursed lip technique during the hardest part of any activity, such as climbing stairs. How to do pursed lip breathing Before you start, take a minute to relax your shoulders and close your eyes. Then: Start by closing your mouth. Breathe in through your nose, taking a normal breath. Do this at your normal rate of breathing. If you feel you're not getting enough air, breathe in while slowly counting to 2 or 3. Pucker or purse your lips, as if you're going to whistle. Gently tighten the muscles of your bellyor press on your belly. Breathe out slowly through your pursed lips. Take at least twice as long to breathe out as it takes you to breathe in. Make sure you breathe out all the air, but don't force air out. Ask your health care provider how often and how long to do this exercise. Follow these instructions at home: Take medicines only as told. Do not smoke, vape, or use nicotine  or tobacco. Ask your provider what activities are safe for you. Where to find more information To learn more:  Go to American Lung Association at omahatransportation.hu. Click Search and type breathing exercises. Find the link you need. Contact a health care provider if: You have problems with this breathing exercise. Your shortness of breath gets worse. You find it harder to exercise or be active. You have a cough. You have a fever or chills. Get help right away if: You have extreme trouble breathing. You pass out or faint. These symptoms may be an emergency. Call 911 right away. Do not wait to see if the symptoms will go away. Do not drive yourself to the hospital. This information is not intended to replace advice given to you by your health care provider.  Make sure you discuss any questions you have with your health care provider. Document Revised: 11/17/2023 Document Reviewed: 11/17/2023 Elsevier Patient Education  2025 Elsevier Inc.Energy Conservation Techniques  Sit for as many activities as possible. Use slow, smooth movements.  Rushing increases discomfort. Determine the necessity of performing the task.  Simplify those tasks that are necessary.  (Get clothes out of the dryer when they are warm instead of ironing, let dishes air dry, etc.) Take frequent rests both during and between activities.  Avoid repetitive tasks. Pre-plan your activities; try a daily and/or weekly schedule.  Spread out the activities that are most fatiguing (break up cleaning tasks over multiple days). Remember to plan a balance of work, rest and recreation. Consider the best time for each activity.  Do the most exertive task when you have the most energy. Don't carry items if you can push them.  Slide, don't lift. Push, don't pull. Utilize two hands when appropriate. Maintain good posture and use proper body mechanics. Avoid remaining in one position for too long. When lifting, bend at the knees, not at the waist.  Exhale when bending down, inhale when straightening up. Carry objects as close to your body and as near to the center of the pelvis.  11. Avoid wasted body movements (position yourself for the task so that you avoid bending, twisting, etc.                when possible). 12. Select the best working environment.  Consider lighting, ventilation, clothing, and equipment. 13. Organize your storage areas, making the items you use daily convenient.  Store heaviest items at waist            height.  Store frequently used items between shoulders and knee height.  Consider leaving frequently used       items on countertops.  (You can organize in storage baskets based on time used/purpose). 14. Feelings and emotions can be real causes of fatigue.  Try to avoid  unnecessary worry, irritation, or                    frustration.  Avoid stress, it can also be a source of fatigue. 15. Get help from other people for difficult tasks. 16. Explore equipment or items that may be able to do the job for you with greater ease.  (Electric can        openers, blenders, lightweight items for cleaning, etc.)   Fall Prevention in the Home, Adult Falls can cause injuries and can happen to people of all ages. There are many things you can do to make your home safer and to help prevent falls. What actions can I take to prevent falls? General information Use good lighting in all rooms. Make sure to: Replace any light bulbs that burn out.  Turn on the lights in dark areas and use night-lights. Keep items that you use often in easy-to-reach places. Lower the shelves around your home if needed. Move furniture so that there are clear paths around it. Do not use throw rugs or other things on the floor that can make you trip. If any of your floors are uneven, fix them. Add color or contrast paint or tape to clearly mark and help you see: Grab bars or handrails. First and last steps of staircases. Where the edge of each step is. If you use a ladder or stepladder: Make sure that it is fully opened. Do not climb a closed ladder. Make sure the sides of the ladder are locked in place. Have someone hold the ladder while you use it. Know where your pets are as you move through your home. What can I do in the bathroom?     Keep the floor dry. Clean up any water on the floor right away. Remove soap buildup in the bathtub or shower. Buildup makes bathtubs and showers slippery. Use non-skid mats or decals on the floor of the bathtub or shower. Attach bath mats securely with double-sided, non-slip rug tape. If you need to sit down in the shower, use a non-slip stool. Install grab bars by the toilet and in the bathtub and shower. Do not use towel bars as grab bars. What can I do  in the bedroom? Make sure that you have a light by your bed that is easy to reach. Do not use any sheets or blankets on your bed that hang to the floor. Have a firm chair or bench with side arms that you can use for support when you get dressed. What can I do in the kitchen? Clean up any spills right away. If you need to reach something above you, use a step stool with a grab bar. Keep electrical cords out of the way. Do not use floor polish or wax that makes floors slippery. What can I do with my stairs? Do not leave anything on the stairs. Make sure that you have a light switch at the top and the bottom of the stairs. Make sure that there are handrails on both sides of the stairs. Fix handrails that are broken or loose. Install non-slip stair treads on all your stairs if they do not have carpet. Avoid having throw rugs at the top or bottom of the stairs. Choose a carpet that does not hide the edge of the steps on the stairs. Make sure that the carpet is firmly attached to the stairs. Fix carpet that is loose or worn. What can I do on the outside of my home? Use bright outdoor lighting. Fix the edges of walkways and driveways and fix any cracks. Clear paths of anything that can make you trip, such as tools or rocks. Add color or contrast paint or tape to clearly mark and help you see anything that might make you trip as you walk through a door, such as a raised step or threshold. Trim any bushes or trees on paths to your home. Check to see if handrails are loose or broken and that both sides of all steps have handrails. Install guardrails along the edges of any raised decks and porches. Have leaves, snow, or ice cleared regularly. Use sand, salt, or ice melter on paths if you live where there is ice and snow during the winter. Clean up any spills in your garage right away. This includes grease  or oil spills. What other actions can I take? Review your medicines with your doctor. Some  medicines can cause dizziness or changes in blood pressure, which increase your risk of falling. Wear shoes that: Have a low heel. Do not wear high heels. Have rubber bottoms and are closed at the toe. Feel good on your feet and fit well. Use tools that help you move around if needed. These include: Canes. Walkers. Scooters. Crutches. Ask your doctor what else you can do to help prevent falls. This may include seeing a physical therapist to learn to do exercises to move better and get stronger. Where to find more information Centers for Disease Control and Prevention, STEADI: tonerpromos.no [employer] on Aging: baseringtones.pl National Institute on Aging: baseringtones.pl Contact a doctor if: You are afraid of falling at home. You feel weak, drowsy, or dizzy at home. You fall at home. Get help right away if you: Lose consciousness or have trouble moving after a fall. Have a fall that causes a head injury. These symptoms may be an emergency. Get help right away. Call 911. Do not wait to see if the symptoms will go away. Do not drive yourself to the hospital. This information is not intended to replace advice given to you by your health care provider. Make sure you discuss any questions you have with your health care provider. Document Revised: 07/26/2022 Document Reviewed: 07/26/2022 Elsevier Patient Education  2024 [employer].

## 2024-11-20 ENCOUNTER — Telehealth: Payer: Self-pay

## 2024-11-20 NOTE — Telephone Encounter (Signed)
 Noted

## 2024-11-20 NOTE — Patient Outreach (Signed)
 RNCM - received message from service - patient called and wanted to cancel tomorrow's appointment. Called patient, rescheduled for after holidays as requested.

## 2024-11-20 NOTE — Telephone Encounter (Signed)
 Copied from CRM #[id number]. Topic: Appointments - Appointment Cancel/Reschedule >> Nov 20, 2024 11:49 AM Franky GRADE wrote: Patient/patient representative is calling to cancel or reschedule an appointment. Refer to attachments for appointment information.  Patient is calling to cancel the virtual appointment scheduled for tomorrow with Weeks, Mariadora, RN.

## 2024-11-21 ENCOUNTER — Telehealth

## 2024-11-23 ENCOUNTER — Other Ambulatory Visit: Payer: Self-pay | Admitting: Internal Medicine

## 2024-12-12 ENCOUNTER — Other Ambulatory Visit: Payer: Self-pay

## 2024-12-12 NOTE — Patient Outreach (Signed)
 Complex Care Management   Visit Note  12/12/2024  Name:  Danielle Yates MRN: [id number] DOB: [birth year]  Situation: Referral received for Complex Care Management related to COPD and Fall Risk /Vision I obtained verbal consent from Patient.  Visit completed with Patient  on the phone  Background:   Past Medical History:  Diagnosis Date   Allergy    Aortic valve sclerosis    a. TTE 2013: EF 60-65%, no RWMA, Gr1DD, trivial MR, mildly dilated LA 43 mm; b. TTE 08/2017: EF 65-70%, mild LVH, aortic valve sclerosis without stenosis, mildly dilated LA 36 mm, RV size and sys fxn nl   Arthropathy, unspecified, site unspecified    Basal cell carcinoma 06/15/2018   upper back right of midline/superficial   Cataract    Chronic rhinitis    Disorder of bone and cartilage, unspecified    Diverticulosis of colon (without mention of hemorrhage)    Essential hypertension    a. refractory HTN; b. renal US  2013 negative for RAS   External hemorrhoids without mention of complication    Heart murmur    Medication intolerance    Obesity, unspecified    Other abnormal glucose    Panic disorder without agoraphobia    Peripheral arterial disease    a.  Known bilateral common iliac disease-> medical therapy-asymptomatic.   Persistent atrial fibrillation (HCC)    a. diagnosed 10/19/17; b. CHADS2VASc 5 (HTN, age x 2, vascular disease, female)-->Eliquis    Pure hypercholesterolemia    Tobacco use disorder    Unspecified vitamin D  deficiency     Assessment: Patient Reported Symptoms:  Cognitive Cognitive Status: No symptoms reported Cognitive/Intellectual Conditions Management [RPT]: None reported or documented in medical history or problem list   Health Maintenance Behaviors: Annual physical exam, Exercise, Spiritual practice(s), Healthy diet, Sleep adequate  Neurological Neurological Review of Symptoms: Headaches Neurological Comment: with weather changes and sinus issues - tylenol prn  HEENT   HEENT  Comment: not connected with church resource yest ue to holidays, will discuss magnifying glasses with vision specialist and discussed tools available on [employer]    Cardiovascular Cardiovascular Symptoms Reported: No symptoms reported Cardiovascular Management Strategies: Medication therapy, Routine screening Cardiovascular Comment: no BP cuff yet - called insurance last week, has money HTA card will get, aware BP cuff that tells reading available, no afib sx, reports weight stable  Respiratory Respiratory Symptoms Reported: Productive cough, Wheezing Additional Respiratory Details: smoking unchanged, some nasal congestion, inhalers as prescribed, reviewed COPD action plan, has not read ed provided yet, PO2 remains 97-98% Respiratory Management Strategies: Routine screening, Medication therapy, Breathing exercise  Endocrine Endocrine Symptoms Reported: Not assessed Is patient diabetic?: No    Gastrointestinal Gastrointestinal Symptoms Reported: No symptoms reported      Genitourinary Genitourinary Symptoms Reported: No symptoms reported    Integumentary Integumentary Symptoms Reported: No symptoms reported    Musculoskeletal Musculoskelatal Symptoms Reviewed: Joint pain, Muscle pain, Back pain Additional Musculoskeletal Details: tylenol prn and cane as needed Musculoskeletal Management Strategies: Routine screening, Medical device Falls in the past year?: No Number of falls in past year: 1 or less Patient at Risk for Falls Due to: Impaired vision Fall risk Follow up: Falls prevention discussed  Psychosocial Psychosocial Symptoms Reported: Depression - if selected complete PHQ 2-9 Other Psychosocial Conditions: reports feeling better re mood, will look into OT at [facility] - did speak with someone who goes there, she will assist with connecting          12/12/2024  PHQ2-9 Depression Screening   Little interest or pleasure in doing things Several days  Feeling down, depressed, or hopeless  Not at all  PHQ-2 - Total Score 1  Trouble falling or staying asleep, or sleeping too much    Feeling tired or having little energy    Poor appetite or overeating     Feeling bad about yourself - or that you are a failure or have let yourself or your family down    Trouble concentrating on things, such as reading the newspaper or watching television    Moving or speaking so slowly that other people could have noticed.  Or the opposite - being so fidgety or restless that you have been moving around a lot more than usual    Thoughts that you would be better off dead, or hurting yourself in some way    PHQ2-9 Total Score    If you checked off any problems, how difficult have these problems made it for you to do your work, take care of things at home, or get along with other people    Depression Interventions/Treatment      There were no vitals filed for this visit.    Medications Reviewed Today     Reviewed by Devra Lands, RN (Registered Nurse) on 12/12/24 at 1339  Med List Status: <None>   Medication Order Taking? Sig Documenting Provider Last Dose Status Informant  albuterol  (VENTOLIN  HFA) 108 (90 Base) MCG/ACT inhaler [id number] Yes INHALE 2 PUFFS INTO THE LUNGS EVERY 6 HOURS AS NEEDED FOR WHEEZING OR SHORTNESS OF BREATH Kasa, Kurian, MD  Active   Cholecalciferol 125 MCG (5000 UT) capsule [id number] Yes Take 5,000 Units by mouth daily. [provider]  Active   Cyanocobalamin  1000 MCG/15ML LIQD [id number] Yes Take by mouth.  Patient taking differently: Take by mouth. Reports taking weekly   [provider]  Active   diltiazem  (TIADYLT  ER) 360 MG 24 hr capsule [id number] Yes TAKE 1 CAPSULE BY MOUTH EVERY DAY Darron Deatrice LABOR, MD  Active   ELIQUIS  5 MG TABS tablet [id number] Yes TAKE 1 TABLET BY MOUTH TWICE A DAY Darron Deatrice LABOR, MD  Active   enalapril  (VASOTEC ) 20 MG tablet [id number] Yes TAKE 1 TABLET BY MOUTH IN THE MORNING AND 2 TABLETS IN THE AFTERNOON. Darron Deatrice LABOR, MD  Active   FLUoxetine  (PROZAC ) 20 MG capsule [id number] Yes Take 1 capsule (20 mg total) by mouth daily. Glendia Shad, MD  Active   fluticasone  (FLONASE ) 50 MCG/ACT nasal spray [id number] Yes SHAKE LIQUID AND USE 2 SPRAYS IN EACH NOSTRIL DAILY Glendia Shad, MD  Active   ipratropium (ATROVENT  HFA) 17 MCG/ACT inhaler [id number] Yes Inhale 2 puffs into the lungs every 6 (six) hours as needed for wheezing.  Patient taking differently: Inhale 2 puffs into the lungs every 6 (six) hours as needed for wheezing. Reports taking nightly before bed and prn   Glendia Shad, MD  Active   Multiple Vitamins-Minerals (PRESERVISION AREDS) CAPS [id number] Yes Take 1 capsule by mouth daily. [provider]  Active   pravastatin  (PRAVACHOL ) 10 MG tablet [id number] Yes Take 1 tablet (10 mg total) by mouth daily. Glendia Shad, MD  Active   Probiotic Product (PROBIOTIC DAILY PO) [id number]  Take 1 tablet by mouth.  Patient not taking: Reported on 11/08/2024   [provider]  Active   triamterene -hydrochlorothiazide (MAXZIDE-25) 37.5-25 MG tablet [id number] Yes TAKE 1 TABLET BY MOUTH EVERY DAY Arida, Muhammad A, MD  Active  VITAMIN D , CHOLECALCIFEROL, PO [id number]  Take by mouth daily. [provider]  Active             Recommendation:   PCP Follow-up Continue Current Plan of Care  Follow Up Plan:   Telephone follow-up in 1 month  Nestora Duos, MSN, RN [facility]  [facility], [facility] RN Care Manager Direct Dial: [phone] Fax: [phone]

## 2024-12-12 NOTE — Patient Instructions (Signed)
 Visit Information  Thank you for taking time to visit with me today. Please don't hesitate to contact me if I can be of assistance to you before our next scheduled appointment.  Your next care management appointment is by telephone on 01/09/2025 at 1:30 pm  Telephone follow-up in 1 month  Please call the care guide team at [phone] if you need to cancel, schedule, or reschedule an appointment.    [facility]: [phone]  MORSE PAYMENT [facility] COUNTY: [phone]   Please call the Suicide and Crisis Lifeline: 988 call the [place]  National Suicide Prevention Lifeline: 703 452 5850 or TTY: (442)458-4237 TTY 564-147-5222) to talk to a trained counselor call 1-800-273-TALK (toll free, 24 hour hotline) go to [facility] [address], [place] [phone]) call 911 if you are experiencing a Mental Health or Behavioral Health Crisis or need someone to talk to.  Nestora Duos, MSN, RN [facility], [facility] RN Care Manager Direct Dial: [phone] Fax: [phone]

## 2025-01-07 ENCOUNTER — Encounter: Admitting: Internal Medicine

## 2025-01-09 ENCOUNTER — Other Ambulatory Visit: Payer: Self-pay

## 2025-01-09 NOTE — Patient Outreach (Signed)
 Complex Care Management   Visit Note  01/09/2025  Name:  Danielle Yates MRN: [id number] DOB: [birth year]  Situation: Referral received for Complex Care Management related to COPD I obtained verbal consent from Patient.  Visit completed with Patient  on the phone  Background:   Past Medical History:  Diagnosis Date   Allergy    Aortic valve sclerosis    a. TTE 2013: EF 60-65%, no RWMA, Gr1DD, trivial MR, mildly dilated LA 43 mm; b. TTE 08/2017: EF 65-70%, mild LVH, aortic valve sclerosis without stenosis, mildly dilated LA 36 mm, RV size and sys fxn nl   Arthropathy, unspecified, site unspecified    Basal cell carcinoma 06/15/2018   upper back right of midline/superficial   Cataract    Chronic rhinitis    Disorder of bone and cartilage, unspecified    Diverticulosis of colon (without mention of hemorrhage)    Essential hypertension    a. refractory HTN; b. renal US  2013 negative for RAS   External hemorrhoids without mention of complication    Heart murmur    Medication intolerance    Obesity, unspecified    Other abnormal glucose    Panic disorder without agoraphobia    Peripheral arterial disease    a.  Known bilateral common iliac disease-> medical therapy-asymptomatic.   Persistent atrial fibrillation (HCC)    a. diagnosed 10/19/17; b. CHADS2VASc 5 (HTN, age x 2, vascular disease, female)-->Eliquis    Pure hypercholesterolemia    Tobacco use disorder    Unspecified vitamin D  deficiency     Assessment: Patient Reported Symptoms:  Cognitive Cognitive Status: No symptoms reported Cognitive/Intellectual Conditions Management [RPT]: None reported or documented in medical history or problem list   Health Maintenance Behaviors: Annual physical exam, Spiritual practice(s) Healing Pattern: Average  Neurological Neurological Review of Symptoms: No symptoms reported Neurological Management Strategies: Routine screening Neurological Self-Management Outcome: 4 (good)  HEENT HEENT  Symptoms Reported: Frequent sneezing HEENT Management Strategies: Routine screening HEENT Self-Management Outcome: 4 (good)    Cardiovascular Cardiovascular Symptoms Reported: No symptoms reported Does patient have uncontrolled Hypertension?: No Cardiovascular Management Strategies: Routine screening, Medication therapy Cardiovascular Self-Management Outcome: 4 (good) Cardiovascular Comment: got it hasnt taken out of box  Respiratory Respiratory Symptoms Reported: Wheezing, Productive cough Additional Respiratory Details: instructed to use inhaler every 6 hrs due to wheezing Respiratory Management Strategies: Routine screening Respiratory Self-Management Outcome: 4 (good)  Endocrine Endocrine Symptoms Reported: No symptoms reported Is patient diabetic?: No Endocrine Self-Management Outcome: 4 (good)  Gastrointestinal Gastrointestinal Symptoms Reported: No symptoms reported Gastrointestinal Management Strategies: Diet modification Gastrointestinal Self-Management Outcome: 4 (good)    Genitourinary Genitourinary Symptoms Reported: No symptoms reported Genitourinary Self-Management Outcome: 4 (good) Genitourinary Comment: uti symptoms reviewed  Integumentary Integumentary Symptoms Reported: No symptoms reported Skin Management Strategies: Routine screening Skin Self-Management Outcome: 4 (good)  Musculoskeletal Musculoskelatal Symptoms Reviewed: Muscle pain, Back pain, Joint pain Additional Musculoskeletal Details: arthritis Musculoskeletal Management Strategies: Routine screening, Medication therapy Musculoskeletal Self-Management Outcome: 4 (good) Falls in the past year?: No Number of falls in past year: 1 or less Was there an injury with Fall?: Yes Fall Risk Category Calculator: 1 Patient Fall Risk Level: Low Fall Risk Patient at Risk for Falls Due to: Impaired vision, Impaired mobility Fall risk Follow up: Education provided, Falls prevention discussed, Falls evaluation completed   Psychosocial Psychosocial Symptoms Reported: No symptoms reported Other Psychosocial Conditions: a little depression refuses need for therapy Behavioral Management Strategies: Coping strategies, Support system Behavioral Health Self-Management Outcome: 4 (good) Major  Change/Loss/Stressor/Fears (CP): Medical condition, self Behaviors When Feeling Stressed/Fearful: smiokes Techniques to Cope with Loss/Stress/Change: Diversional activities, Spiritual practice(s) Quality of Family Relationships: supportive    01/09/2025    PHQ2-9 Depression Screening   Little interest or pleasure in doing things Not at all  Feeling down, depressed, or hopeless Not at all  PHQ-2 - Total Score 0  Trouble falling or staying asleep, or sleeping too much    Feeling tired or having little energy    Poor appetite or overeating     Feeling bad about yourself - or that you are a failure or have let yourself or your family down    Trouble concentrating on things, such as reading the newspaper or watching television    Moving or speaking so slowly that other people could have noticed.  Or the opposite - being so fidgety or restless that you have been moving around a lot more than usual    Thoughts that you would be better off dead, or hurting yourself in some way    PHQ2-9 Total Score    If you checked off any problems, how difficult have these problems made it for you to do your work, take care of things at home, or get along with other people    Depression Interventions/Treatment      There were no vitals filed for this visit.    Medications Reviewed Today     Reviewed by Nivia, Sophiagrace Benbrook , RN (Registered Nurse) on 01/09/25 at 1351  Med List Status: <None>   Medication Order Taking? Sig Documenting Provider Last Dose Status Informant  albuterol  (VENTOLIN  HFA) 108 (90 Base) MCG/ACT inhaler [id number]  INHALE 2 PUFFS INTO THE LUNGS EVERY 6 HOURS AS NEEDED FOR WHEEZING OR SHORTNESS OF BREATH Kasa, Kurian, MD  Active    Cholecalciferol 125 MCG (5000 UT) capsule [id number]  Take 5,000 Units by mouth daily. [provider]  Active   Cyanocobalamin  1000 MCG/15ML LIQD [id number]  Take by mouth.  Patient taking differently: Take by mouth. Reports taking weekly   [provider]  Active   diltiazem  (TIADYLT  ER) 360 MG 24 hr capsule [id number]  TAKE 1 CAPSULE BY MOUTH EVERY DAY Arida, Muhammad A, MD  Active   ELIQUIS  5 MG TABS tablet [id number]  TAKE 1 TABLET BY MOUTH TWICE A DAY Darron Deatrice LABOR, MD  Active   enalapril  (VASOTEC ) 20 MG tablet [id number]  TAKE 1 TABLET BY MOUTH IN THE MORNING AND 2 TABLETS IN THE AFTERNOON. Darron Deatrice LABOR, MD  Active   FLUoxetine  (PROZAC ) 20 MG capsule [id number]  Take 1 capsule (20 mg total) by mouth daily. Glendia Shad, MD  Active   fluticasone  (FLONASE ) 50 MCG/ACT nasal spray [id number]  SHAKE LIQUID AND USE 2 SPRAYS IN EACH NOSTRIL DAILY Glendia Shad, MD  Active   ipratropium (ATROVENT  HFA) 17 MCG/ACT inhaler [id number]  Inhale 2 puffs into the lungs every 6 (six) hours as needed for wheezing.  Patient taking differently: Inhale 2 puffs into the lungs every 6 (six) hours as needed for wheezing. Reports taking nightly before bed and prn   Glendia Shad, MD  Active   Multiple Vitamins-Minerals (PRESERVISION AREDS) CAPS [id number]  Take 1 capsule by mouth daily. [provider]  Active   pravastatin  (PRAVACHOL ) 10 MG tablet [id number]  Take 1 tablet (10 mg total) by mouth daily. Glendia Shad, MD  Active   Probiotic Product (PROBIOTIC DAILY PO) [id number]  Take 1 tablet by mouth.  Patient not taking: Reported on  11/08/2024   [provider]  Active   triamterene -hydrochlorothiazide (MAXZIDE-25) 37.5-25 MG tablet [id number]  TAKE 1 TABLET BY MOUTH EVERY DAY Darron Deatrice LABOR, MD  Active   VITAMIN D , CHOLECALCIFEROL, PO [id number]  Take by mouth daily. [provider]  Active             Recommendation:   Continue Current Plan of  Care  Follow Up Plan:   Telephone follow-up in 1 month  Milagro Belmares RN RN Care Manager [facility] [phone]

## 2025-01-09 NOTE — Patient Instructions (Signed)
 Visit Information  Thank you for taking time to visit with me today. Please don't hesitate to contact me if I can be of assistance to you before our next scheduled appointment.  Your next care management appointment is by telephone on 02/07/25 at 10:00am  Telephone follow-up in 1 month  Please call the care guide team at [phone] if you need to cancel, schedule, or reschedule an appointment.   Please call the Suicide and Crisis Lifeline: 988 call the [place]  National Suicide Prevention Lifeline: 660 718 4973 or TTY: 201-564-6535 TTY 304-109-3600) to talk to a trained counselor call 1-800-273-TALK (toll free, 24 hour hotline) if you are experiencing a Mental Health or Behavioral Health Crisis or need someone to talk to.  Danielle Vantassell RN RN Care Manager [facility] [phone]

## 2025-01-10 ENCOUNTER — Telehealth: Payer: Self-pay

## 2025-01-10 ENCOUNTER — Other Ambulatory Visit: Payer: Self-pay

## 2025-01-10 DIAGNOSIS — I10 Essential (primary) hypertension: Secondary | ICD-10-CM

## 2025-01-10 DIAGNOSIS — R739 Hyperglycemia, unspecified: Secondary | ICD-10-CM

## 2025-01-10 DIAGNOSIS — E7800 Pure hypercholesterolemia, unspecified: Secondary | ICD-10-CM

## 2025-01-10 NOTE — Telephone Encounter (Signed)
 Spoke to pt.pt was needing too reschedule appt and have labs placed for [facility]. Appt has been rescheduled. Will send labs over to [facility] in [facility] to have drawn. Pt notified of this as well

## 2025-01-10 NOTE — Telephone Encounter (Signed)
 Lab order faxed.

## 2025-01-10 NOTE — Progress Notes (Signed)
 Labs needed for [facility] per pt request

## 2025-01-10 NOTE — Telephone Encounter (Signed)
 Copied from CRM #[id number]. Topic: Clinical - Request for Lab/Test Order >> Jan 10, 2025  8:31 AM Mercedes MATSU wrote: Reason for CRM: patient wants to know where her labs were sent to for her to get her labs drawn. Her labs say [facility], but she wanted to make sure she can go to any [facility]. Patient can be reached at [phone].  Patient is requesting her future labs be sent to the [facility] [address] for blood draw its closer to her house.

## 2025-01-11 ENCOUNTER — Ambulatory Visit: Admitting: Internal Medicine

## 2025-01-14 ENCOUNTER — Ambulatory Visit: Admitting: Podiatry

## 2025-02-05 ENCOUNTER — Ambulatory Visit: Admitting: Internal Medicine

## 2025-02-07 ENCOUNTER — Telehealth

## 2025-03-12 ENCOUNTER — Ambulatory Visit: Admitting: Internal Medicine

## 2025-08-26 ENCOUNTER — Encounter: Admitting: Dermatology
